# Patient Record
Sex: Male | Born: 1965 | Race: White | Hispanic: No | Marital: Married | Smoking: Former smoker
Health system: Southern US, Community
[De-identification: ages and names within clinical notes are randomized; demographics above are authoritative.]

## PROBLEM LIST (undated history)

## (undated) DIAGNOSIS — M549 Dorsalgia, unspecified: Secondary | ICD-10-CM

## (undated) DIAGNOSIS — G4733 Obstructive sleep apnea (adult) (pediatric): Secondary | ICD-10-CM

## (undated) DIAGNOSIS — F50819 Binge eating disorder, unspecified: Secondary | ICD-10-CM

## (undated) DIAGNOSIS — F419 Anxiety disorder, unspecified: Secondary | ICD-10-CM

## (undated) DIAGNOSIS — R0600 Dyspnea, unspecified: Secondary | ICD-10-CM

## (undated) DIAGNOSIS — R600 Localized edema: Secondary | ICD-10-CM

## (undated) DIAGNOSIS — F5081 Binge eating disorder: Secondary | ICD-10-CM

## (undated) DIAGNOSIS — E782 Mixed hyperlipidemia: Secondary | ICD-10-CM

## (undated) DIAGNOSIS — H409 Unspecified glaucoma: Secondary | ICD-10-CM

## (undated) DIAGNOSIS — K5901 Slow transit constipation: Secondary | ICD-10-CM

## (undated) DIAGNOSIS — I10 Essential (primary) hypertension: Secondary | ICD-10-CM

## (undated) DIAGNOSIS — R7303 Prediabetes: Secondary | ICD-10-CM

## (undated) DIAGNOSIS — E669 Obesity, unspecified: Secondary | ICD-10-CM

## (undated) DIAGNOSIS — F339 Major depressive disorder, recurrent, unspecified: Secondary | ICD-10-CM

## (undated) HISTORY — DX: Dyspnea, unspecified: R06.00

## (undated) HISTORY — DX: Unspecified glaucoma: H40.9

## (undated) HISTORY — DX: Dorsalgia, unspecified: M54.9

## (undated) HISTORY — DX: Essential (primary) hypertension: I10

## (undated) HISTORY — DX: Binge eating disorder: F50.81

## (undated) HISTORY — DX: Slow transit constipation: K59.01

## (undated) HISTORY — DX: Obesity, unspecified: E66.9

## (undated) HISTORY — PX: SPINAL FUSION: SHX223

## (undated) HISTORY — DX: Major depressive disorder, recurrent, unspecified: F33.9

## (undated) HISTORY — DX: Prediabetes: R73.03

## (undated) HISTORY — DX: Anxiety disorder, unspecified: F41.9

## (undated) HISTORY — DX: Mixed hyperlipidemia: E78.2

## (undated) HISTORY — DX: Obstructive sleep apnea (adult) (pediatric): G47.33

## (undated) HISTORY — DX: Binge eating disorder, unspecified: F50.819

## (undated) HISTORY — DX: Localized edema: R60.0

---

## 2001-07-28 ENCOUNTER — Encounter: Payer: Self-pay | Admitting: Orthopaedic Surgery

## 2001-07-28 ENCOUNTER — Ambulatory Visit (HOSPITAL_COMMUNITY): Admission: RE | Admit: 2001-07-28 | Discharge: 2001-07-28 | Payer: Self-pay | Admitting: Orthopaedic Surgery

## 2001-08-28 ENCOUNTER — Encounter: Payer: Self-pay | Admitting: Orthopaedic Surgery

## 2001-09-01 ENCOUNTER — Encounter: Payer: Self-pay | Admitting: Orthopaedic Surgery

## 2001-09-01 ENCOUNTER — Inpatient Hospital Stay (HOSPITAL_COMMUNITY): Admission: RE | Admit: 2001-09-01 | Discharge: 2001-09-03 | Payer: Self-pay | Admitting: Orthopaedic Surgery

## 2001-09-03 ENCOUNTER — Encounter: Payer: Self-pay | Admitting: Orthopaedic Surgery

## 2003-12-05 ENCOUNTER — Inpatient Hospital Stay (HOSPITAL_COMMUNITY): Admission: RE | Admit: 2003-12-05 | Discharge: 2003-12-09 | Payer: Self-pay | Admitting: Orthopaedic Surgery

## 2008-05-01 ENCOUNTER — Encounter: Admission: RE | Admit: 2008-05-01 | Discharge: 2008-06-13 | Payer: Self-pay | Admitting: Family Medicine

## 2008-12-18 ENCOUNTER — Encounter: Admission: RE | Admit: 2008-12-18 | Discharge: 2008-12-18 | Payer: Self-pay | Admitting: Family Medicine

## 2011-04-09 NOTE — Discharge Summary (Signed)
NAME:  Kenneth Green, Kenneth Green                        ACCOUNT NO.:  [id number]   MEDICAL RECORD NO.:  [id number]                   PATIENT TYPE:  INP   LOCATION:  5018                                 FACILITY:  [facility]   PHYSICIAN:  Sharolyn Douglas, M.D.                     DATE OF BIRTH:  November 29, 1965   DATE OF ADMISSION:  12/05/2003  DATE OF DISCHARGE:  12/09/2003                                 DISCHARGE SUMMARY   ADMISSION DIAGNOSES:  1. Cervical spondylotic myeloradiculopathy.  2. Anxiety and depression.  3. Hypertension.   DISCHARGE DIAGNOSES:  1. Status post C3-7 posterior small effusion, doing well.  2. Mild postoperative hemorrhagic anemia that was asymptomatic and did not     require transfusion.  3. Low-grade temperature resolved prior to discharge.  4. Anxiety and depression.  5. Hypertension.   CONSULTS:  None.   PROCEDURES:  Posterior spinal fusion of C3-7 with lateral lag screws and  pedicle screws.  This was done by Sharolyn Douglas, M.D., and assistant [facility]  Anesthesia was general.   LABORATORIES:  Preoperatively on December 02, 2003, the CBC showed the  differential within normal limits with the exception of absolute monocytes  of 0.8 and eosinophils of 7.  The comprehensive metabolic panel was normal.  The UA was negative.  H&H monitored postoperatively.  On December 06, 2003,  it reached its low at 11.6 and 34.2.  It was increasing by the following day  to 12.4 and 36.4.  He was asymptomatic.  He did not require transfusion.  The basic metabolic panel postoperatively showed a sodium slightly decreased  at 132, glucose elevated at 111, and otherwise normal.  The UA from December 05, 2003, was negative with the exception of urobilinogen of 2.0.  Culture  was negative with no growth for one day.   X-RAYS:  On December 05, 2003, C-arm intraoperatively for localization.  Cervical spine x-rays on December 09, 2003, showed posterolateral spinal  fixation at the level of  C3-7 without any complicated features.  EKG from  December 02, 2003, showed normal sinus rhythm and left anterior fascicular  block unconfirmed.   BRIEF HISTORY:  The patient is a 45 year old male who has been having  progressive difficulty with his fine motor manipulation and ambulation.  He  has not had any significant pain in his neck, however, secondary to findings  of cervical spine mild radiculopathy with his physical exam findings, as  well as MRI and x-ray findings, it was felt that the best course of  management would be decompression, as well as posterior spinal effusion.  The risks and benefits of this procedure were discussed with the patient at  length by both Dr. Noel Gerold, as well as myself.  He indicates understanding and  opted to proceed with the surgery.   HOSPITAL COURSE:  On December 05, 2003, the patient was admitted to  the  hospital for the above-listed procedure and tolerated the procedure well.  There was approximately 1000 mL blood loss with 400 mL returned via Cell  Saver.  There were no intraoperative complications.  He was transferred to  the recovery room in stable condition.  Postoperatively routine orthopedic  protocol was followed.  He did very well and did not develop any significant  postoperative complications.  He did have some difficulty overnight on  December 06, 2003, when he got extremely anxious.  He was complaining of  pain.  He was given morphine.  That did help to some degree, as well as  Percocet.  The following day, he was doing much better.  He was comfortable  utilizing a combination of PCA and pain pills.  Over the course of  postoperative days #2 and #3, he was weaned off of the PCA pump and onto  oral medication only.  He continued to do relatively well.  Physical therapy  and occupational therapy did work with the patient on a progressive  ambulation program, as well as spine precautions.  He did well with them.  By December 08, 2003, he  continued to have some pain in his neck that  required IV pain medications.  Therefore, he was kept in the hospital  overnight one more night.  By December 09, 2003, his pain had improved.  The  oral medications were doing a good job at controlling it to a tolerable  level.  He had met all orthopedic goals and was medically stable and ready  for discharge.  His vital signs were stable.  He was afebrile.   DISCHARGE PLAN:  The patient is a 45 year old male, status post posterior  spinal fusion at C3-7 and doing well.   ACTIVITY:  He should get up and ambulate every day.  He should have the  brace on at all times.  He should avoid any lifting over 5 pounds.  He  should avoid all overhead activities.   WOUND CARE:  Should have dressing change daily to the posterior aspect of  the neck.  He may shower on postoperative day #5 if there is no drainage  from the incision.  Until that time, he should keep the incision dry.   FOLLOWUP:  He is to follow up two weeks postoperatively with Dr. Noel Gerold.  He  is instructed to call for an appointment.   DISCHARGE MEDICATIONS:  1. OxyContin 20 mg b.i.d.  2. Percocet p.r.n.  3. Robaxin p.r.n.  4. Multivitamins daily.  5. Calcium daily.  6. Over the counter laxative b.i.d.  7. Resume any home medications.   DIET:  Regular diet as tolerated.   CONDITION ON DISCHARGE:  Stable and improved.   DISPOSITION:  The patient is being discharged to his home with his family's  assistance.  Home health physical therapy, occupational therapy, and home  equipment needs have been arranged.      Verlin Fester, P.A.                       Sharolyn Douglas, M.D.    CM/MEDQ  D:  01/29/2004  T:  01/30/2004  Job:  [id number]

## 2011-04-09 NOTE — Op Note (Signed)
NAME:  Kenneth Green, Kenneth Green                        ACCOUNT NO.:  [id number]   MEDICAL RECORD NO.:  [id number]                   PATIENT TYPE:  INP   LOCATION:  2899                                 FACILITY:  [facility]   PHYSICIAN:  Sharolyn Douglas, M.D.                     DATE OF BIRTH:  05/06/1966   DATE OF PROCEDURE:  12/05/2003  DATE OF DISCHARGE:                                 OPERATIVE REPORT   PREOPERATIVE DIAGNOSIS:  Cervical spondylitic myelopathy.   PROCEDURE:  1. Cervical laminectomy C3 through C7.  2. Posterior spinal arthrodesis C3 through C7.  3. Segmental spinal instrumentation C3 through C7 utilizing lateral mass     screws and C7 pedicle screw.  4. Local autogenous bone graft.  5. Neuro monitoring utilizing SSEP motor evoked potentials and EMG's.   SURGEON:  Sharolyn Douglas, M.D.   ASSISTANT:  Verlin Fester, P.A.   ANESTHESIA:  General endotracheal.   COMPLICATIONS:  None.   INDICATIONS FOR PROCEDURE:  The patient is a 45 year old male who I have  been following for several years.  His MRI scan demonstrates multilevel  cervical spinal stenosis.  He is status post ACDF at C5-6 several years ago  for disk herniation and cord compression.  His myelopathy has progressed  over the past several months and he has elected to undergo posterior  cervical laminectomy and fusion in hopes of halting the progression of his  disease and symptomatic improvement.  Of note, the patient reported before  surgery today that he had been having increasing problems with his balance  and use of the left side of his body especially his left hand.  He has been  dropping cups and jars.  Risks, benefits, and alternatives were extensively  discussed and he has elected to proceed.   DESCRIPTION OF PROCEDURE:  The patient was properly identified in the  holding area and taken to the operating room.  He underwent general  endotracheal anesthesia without difficulty.  We maintained his neck in the  neutral  position during intubation.  Neuro monitoring was then established  in the form of SSEP's, motor evoked potentials, and upper extremity EMG's.  The Mayfield tongs were then placed after sterilely prepping his scalp. He  was turned prone onto the operating room table.  I controlled his neck  during the turning.  His head was attached to the operating room table using  the Mayfield attachment arm.  Care was taken not to hyperextend the neck.  We positioned him in a neutral position.  His arms were tucked.  All bony  prominences were padded.  Face and eyes protected at all times.  Fluoroscopy  used to confirm appropriate positioning of the cervical spine.  Because of  his large girth and shoulders, it was difficult to evaluate below the C3  level.  The neck was then prepped and draped in the usual sterile fashion.  A 10 cm incision was made in the midline from C2 down to T1.  Dissection was  carried down to the deep fascia.  A very deep layer of adipose tissue was  encountered greater than 6 cm in depth. The deep fascia was then excised and  the nuchal ligament was carried down to the spinous processes.  The  paraspinal muscles were subperiosteally elevated out to the lateral border  of the lateral masses from C3 down to C7. Care was taken to protect the C2-3  facet joint capsule as well as not to remove the muscular attachment to the  C2 spinous process.  We placed deep retractors.  The facet joints from C3  down to C7 were debrided of all soft tissue using a curet.  High speed bur  was utilized to decorticate the facet joints.  We then drilled lateral mass  screw holes at C3, C4, C5, and C6 bilaterally.  Each screw hole was drilled,  measured, and then tapped.  We placed 3.5 x 12 mm screws bilaterally at C3,  14 x 3.5 mm screws at C4, 3.5 x 14 mm screw on the left at C5 and a 10 mm  screw on the right at C5.  12 mm screws were placed bilaterally at C6.  We  then turned our attention to  performing a laminectomy C3 through C7.  High  speed bur used to create two troughs at the lamina facet junction.  We then  completed the laminectomy with a 1 mm Kerrison through each trough. We then  elevated the ligamentum flavum and lifted off the lamina en block.  Bleeding  was controlled with bipolar electrocautery and Gelfoam.  Great care was  taken to protect the underlying spinal cord at all times.  We completed  foraminotomies bilaterally at each level using a 2 mm Kerrison.  We then  turned our attention to placing a pedicle screw at C7 bilaterally.  The  pedicle could be palpated from within the spinal canal.  Drilled the pedicle  and then placed 16 x 4.0 mm screws bilaterally.  We had excellent screw  purchase.  There were no deleterious changes in the EMG's, SSEP's, or motor  evoked potentials.  We then morselized the lamina and packed the facet  joints with the bone graft material completing the fusion.  We placed  Titanium rods into the polyaxial screw head with the appropriate caps.  Each  cap was then tightened using the final tightener.  Hemostasis was achieved  with bipolar electrocautery and Gelfoam. The wound was irrigated.  Deep  Hemovac drain placed.  The deep fascia closed with interrupted #1 Vicryl  sutures.  Subcutaneous layer closed with 0 Vicryl followed by 2-0 Vicryl  sutures.  The skin edges were approximated with a running 3-0 nylon suture.  A sterile dressing applied.  The patient was turned supine.  The Mayfield  tongs were removed.  The pin sites were intact.  An Aspen cervical collar  was fitted.  The patient was extubated without difficulty and transferred to  the recovery room in stable condition able to move his upper and lower  extremities.                                               Sharolyn Douglas, M.D.    MC/MEDQ  D:  12/05/2003  T:  12/06/2003  Job:  [id number]

## 2011-04-09 NOTE — H&P (Signed)
NAME:  Kenneth Green, Kenneth Green                        ACCOUNT NO.:  [id number]   MEDICAL RECORD NO.:  [id number]                   PATIENT TYPE:  INP   LOCATION:  2899                                 FACILITY:  [facility]   PHYSICIAN:  Sharolyn Douglas, M.D.                     DATE OF BIRTH:  1966/03/05   DATE OF ADMISSION:  12/05/2003  DATE OF DISCHARGE:                                HISTORY & PHYSICAL   CHIEF COMPLAINT:  Decrease in function in his upper extremities, as well as  progressively worsening paresthesias and numbness primarily in his left  upper extremity, as well as the left lower extremity, and increasing  difficulty with balance.   HISTORY OF PRESENT ILLNESS:  The patient is a 45 year old male who was  diagnosed with cervical spondylosis and cervical stenosis.  He has had  progressively worsening paresthesias in his upper extremities, primarily in  the left, as well as beginning to have paresthesias in his lower  extremities.  He is having increasing difficulty with coordination and  function with fine motor movements in his upper extremities.  He also has  increasing difficulty with balance and gait.  He was found to have cervical  spinal stenosis.  Secondary to these findings and deteriorating function, it  was felt that his best course of management would be a posterior cervical  laminectomy, as well as fusion from C3 to C7.  The risks and benefits of  this procedure were discussed with the patient by Dr. Noel Gerold, as well as  myself.  He indicated understanding and opted to proceed.   ALLERGIES:  No known drug allergies.   MEDICATIONS:  1. Benazepril HCTZ 15 mg p.o. daily.  2. Paxil HCl 20 mg p.o. daily.   PAST MEDICAL HISTORY:  Significant for:  1. Depression and anxiety.  2. Hypertension.   PAST SURGICAL HISTORY:  ACDF two years ago.   SOCIAL HISTORY:  The patient denies tobacco use.  Denies alcohol use.  He is  married.  His wife will be available to help through his  postoperative  course.   FAMILY MEDICAL HISTORY:  Noncontributory.   REVIEW OF SYSTEMS:  The patient denies fever, chills, sweats, or bleeding  tendencies.  CNS:  Denies blurred vision, double vision, seizures,  headaches, or paralysis.  CARDIOVASCULAR:  Denies chest pain, angina,  orthopnea, or palpitations.  PULMONARY:  Denies shortness of breath,  productive cough, or hemoptysis.  GASTROINTESTINAL:  Denies nausea,  vomiting, constipation, diarrhea, melena, or bloody stools.  GENITOURINARY:  Denies hematuria, dysuria, or discharge.  MUSCULOSKELETAL:  As per HPI.   PHYSICAL EXAMINATION:  GENERAL APPEARANCE:  The patient is a 45 year old  white male who is alert and oriented and in no acute distress.  He is well  nourished, appears his stated age, and is pleasant and cooperative to exam.  HEENT:  The head is normocephalic and atraumatic.  The  pupils are equal,  round, and reactive.  Extraocular movements are intact.  Nares patent.  The  pharynx is clear.  NECK:  Soft to palpation.  No lymphadenopathy, thyromegaly, or bruits  appreciated.  CHEST:  Clear to auscultation bilaterally.  No rales, rhonchi, stridor,  wheezes, or friction rubs.  BREASTS:  No pertinent and not performed.  HEART:  S1 and S2.  Regular rate and rhythm.  No murmurs, rubs, or gallops  noted.  ABDOMEN:  Soft to palpation.  Nontender and nondistended.  No organomegaly  noted.  Positive bowel sounds throughout.  GENITOURINARY:  Not pertinent and not performed.  EXTREMITIES:  The patient's motor function and sensation are grossly intact.  See the operative notes for details of the exam.  SKIN:  Intact without any lesions or rashes.   LABORATORY DATA:  MRI shows cervical stenosis and spondylosis.   IMPRESSION:  1. Cervical spondylitic myeloradiculopathy.  2. Anxiety and depression.  3. Hypertension.   PLAN:  Admit to [facility] on November 25, 2003, for a posterior  cervical fusion and laminectomy from  C3 to C7.  This will be done by Sharolyn Douglas, M.D.      Verlin Fester, P.A.                       Sharolyn Douglas, M.D.    CM/MEDQ  D:  12/05/2003  T:  12/05/2003  Job:  [id number]

## 2013-04-02 ENCOUNTER — Ambulatory Visit (INDEPENDENT_AMBULATORY_CARE_PROVIDER_SITE_OTHER): Payer: BC Managed Care – PPO | Admitting: Surgery

## 2013-04-02 ENCOUNTER — Encounter (INDEPENDENT_AMBULATORY_CARE_PROVIDER_SITE_OTHER): Payer: Self-pay | Admitting: Surgery

## 2013-04-02 VITALS — BP 152/86 | HR 64 | Temp 97.0°F | Resp 16 | Ht 69.0 in | Wt 285.6 lb

## 2013-04-02 DIAGNOSIS — L723 Sebaceous cyst: Secondary | ICD-10-CM

## 2013-04-02 NOTE — Patient Instructions (Signed)

## 2013-04-02 NOTE — Progress Notes (Signed)
[name], male   DOB: 29-Sep-1966, 47 y.o.   MRN: [id number]  Chief Complaint  Patient presents with  . New Evaluation    eval seb cyst on scalp    HPI Kenneth Green is a 47 y.o. male.  Patient sent at the request of Dr. Uvaldo Rising for cyst on right scalp. This has been present for a number of months. It swells and drains recurs. It becomes tender. HPI  Past Medical History  Diagnosis Date  . Hypertension   . Glaucoma     Past Surgical History  Procedure Laterality Date  . Spinal fusion      Family History  Problem Relation Age of Onset  . Heart disease Mother   . Stroke Father   . Heart disease Sister   . Diabetes Sister     Social History History  Substance Use Topics  . Smoking status: Former Games developer  . Smokeless tobacco: Never Used  . Alcohol Use: Yes    No Known Allergies  Current Outpatient Prescriptions  Medication Sig Dispense Refill  . Cholecalciferol (VITAMIN D PO) Take by mouth.      . fish oil-omega-3 fatty acids 1000 MG capsule Take 2 g by mouth daily.      . Multiple Vitamin (MULTIVITAMIN) tablet Take 1 tablet by mouth daily.      . travoprost, benzalkonium, (TRAVATAN) 0.004 % ophthalmic solution 1 drop at bedtime.      . benazepril-hydrochlorthiazide (LOTENSIN HCT) 10-12.5 MG per tablet       . buPROPion (WELLBUTRIN XL) 150 MG 24 hr tablet       . imiquimod (ALDARA) 5 % cream        No current facility-administered medications for this visit.    Review of Systems Review of Systems  Constitutional: Negative.   Cardiovascular: Negative.   Endocrine: Negative.   Musculoskeletal: Negative.   Skin: Negative.   Neurological: Negative.   Hematological: Negative.     Blood pressure 152/86, pulse 64, temperature 97 F (36.1 C), temperature source Temporal, resp. rate 16, height 5\' 9"  (1.753 m), weight 285 lb 9.6 oz (129.547 kg).  Physical Exam Physical Exam  Constitutional: He is oriented to person, place, and time. He appears  well-developed and well-nourished.  HENT:  Head:    Pulmonary/Chest: Effort normal.  Musculoskeletal: Normal range of motion.  Neurological: He is alert and oriented to person, place, and time.  Skin: Skin is warm and dry.  Psychiatric: He has a normal mood and affect. His behavior is normal. Thought content normal.      Assessment    Epidermal inclusion cyst symptomatic    Plan    Return to clinic for excision the near future.The procedure has been discussed with the patient.  Alternative therapies have been discussed with the patient.  Operative risks include bleeding,  Infection,  Organ injury,  Nerve injury,  Blood vessel injury,  DVT,  Pulmonary embolism,  Death,  And possible reoperation.  Medical management risks include worsening of present situation.  The success of the procedure is 50 -90 % at treating patients symptoms.  The patient understands and agrees to proceed.       Jorie Zee A. 04/02/2013, 11:30 AM

## 2013-04-20 ENCOUNTER — Ambulatory Visit (INDEPENDENT_AMBULATORY_CARE_PROVIDER_SITE_OTHER): Payer: BC Managed Care – PPO | Admitting: Surgery

## 2013-04-20 ENCOUNTER — Encounter (INDEPENDENT_AMBULATORY_CARE_PROVIDER_SITE_OTHER): Payer: Self-pay | Admitting: Surgery

## 2013-04-20 VITALS — BP 150/96 | HR 76 | Temp 97.1°F | Resp 16 | Ht 69.0 in | Wt 289.6 lb

## 2013-04-20 DIAGNOSIS — L723 Sebaceous cyst: Secondary | ICD-10-CM

## 2013-04-20 DIAGNOSIS — L720 Epidermal cyst: Secondary | ICD-10-CM

## 2013-04-20 NOTE — Progress Notes (Signed)
Subjective:     [name], male   DOB: Mar 24, 1966, 47 y.o.   MRN: [id number]  HPI Patient will return for excision of ruptured epidermal inclusion cyst right scalp. No complaints.  Review of Systems  Constitutional: Negative.        Objective:   Physical Exam  HENT:  Head:         Assessment:     Ruptured 1 cm epidermal inclusion cyst right scalp    Plan:     Excision today in office. She did discuss the patient. Informed consent obtained. The right scalp was prepped with Betadine 1% lidocaine was used for anesthesia. I marsupialized the cyst since it or it ruptured and the lining was disrupted. Silver nitrate applied. Dry dressing applied. Total size 1 cm. Patient tolerated well. Care instructions given. Followup as needed .

## 2013-04-20 NOTE — Patient Instructions (Signed)
Apply neosporin daily  Cover with band aid.  Return as needed.

## 2015-04-23 ENCOUNTER — Other Ambulatory Visit: Payer: Self-pay | Admitting: Family Medicine

## 2015-04-23 DIAGNOSIS — R109 Unspecified abdominal pain: Secondary | ICD-10-CM

## 2015-04-28 ENCOUNTER — Ambulatory Visit
Admission: RE | Admit: 2015-04-28 | Discharge: 2015-04-28 | Disposition: A | Discharge status: Home / Self Care | Payer: Managed Care, Other (non HMO) | Source: Ambulatory Visit | Attending: Family Medicine | Admitting: Family Medicine

## 2015-04-28 DIAGNOSIS — R109 Unspecified abdominal pain: Secondary | ICD-10-CM

## 2015-04-28 MED ORDER — IOPAMIDOL (ISOVUE-300) INJECTION 61%
125.0000 mL | Freq: Once | INTRAVENOUS | Status: AC | PRN
Start: 2015-04-28 — End: 2015-04-28
  Administered 2015-04-28: 125 mL via INTRAVENOUS

## 2015-04-29 ENCOUNTER — Other Ambulatory Visit: Payer: Self-pay

## 2016-06-15 ENCOUNTER — Emergency Department (HOSPITAL_BASED_OUTPATIENT_CLINIC_OR_DEPARTMENT_OTHER): Payer: Managed Care, Other (non HMO)

## 2016-06-15 ENCOUNTER — Emergency Department (HOSPITAL_BASED_OUTPATIENT_CLINIC_OR_DEPARTMENT_OTHER)
Admission: EM | Admit: 2016-06-15 | Discharge: 2016-06-15 | Disposition: A | Discharge status: Home / Self Care | Payer: Managed Care, Other (non HMO) | Attending: Emergency Medicine | Admitting: Emergency Medicine

## 2016-06-15 ENCOUNTER — Encounter (HOSPITAL_BASED_OUTPATIENT_CLINIC_OR_DEPARTMENT_OTHER): Payer: Self-pay | Admitting: *Deleted

## 2016-06-15 DIAGNOSIS — I10 Essential (primary) hypertension: Secondary | ICD-10-CM | POA: Diagnosis not present

## 2016-06-15 DIAGNOSIS — Z87891 Personal history of nicotine dependence: Secondary | ICD-10-CM | POA: Insufficient documentation

## 2016-06-15 DIAGNOSIS — Z79899 Other long term (current) drug therapy: Secondary | ICD-10-CM | POA: Insufficient documentation

## 2016-06-15 DIAGNOSIS — R109 Unspecified abdominal pain: Secondary | ICD-10-CM

## 2016-06-15 DIAGNOSIS — K529 Noninfective gastroenteritis and colitis, unspecified: Secondary | ICD-10-CM | POA: Diagnosis not present

## 2016-06-15 DIAGNOSIS — R1084 Generalized abdominal pain: Secondary | ICD-10-CM

## 2016-06-15 LAB — CBC WITH DIFFERENTIAL/PLATELET
Basophils Absolute: 0 10*3/uL (ref 0.0–0.1)
Basophils Relative: 0 %
Eosinophils Absolute: 0.3 10*3/uL (ref 0.0–0.7)
Eosinophils Relative: 4 %
HCT: 40.8 % (ref 39.0–52.0)
Hemoglobin: 14.1 g/dL (ref 13.0–17.0)
Lymphocytes Relative: 20 %
Lymphs Abs: 1.5 10*3/uL (ref 0.7–4.0)
MCH: 29 pg (ref 26.0–34.0)
MCHC: 34.6 g/dL (ref 30.0–36.0)
MCV: 83.8 fL (ref 78.0–100.0)
Monocytes Absolute: 0.7 10*3/uL (ref 0.1–1.0)
Monocytes Relative: 8 %
Neutro Abs: 5.2 10*3/uL (ref 1.7–7.7)
Neutrophils Relative %: 68 %
Platelets: 235 10*3/uL (ref 150–400)
RBC: 4.87 MIL/uL (ref 4.22–5.81)
RDW: 13 % (ref 11.5–15.5)
WBC: 7.7 10*3/uL (ref 4.0–10.5)

## 2016-06-15 LAB — URINALYSIS, ROUTINE W REFLEX MICROSCOPIC
Bilirubin Urine: NEGATIVE
Glucose, UA: NEGATIVE mg/dL
Hgb urine dipstick: NEGATIVE
Ketones, ur: NEGATIVE mg/dL
Leukocytes, UA: NEGATIVE
Nitrite: NEGATIVE
Protein, ur: NEGATIVE mg/dL
Specific Gravity, Urine: 1.005 (ref 1.005–1.030)
pH: 5.5 (ref 5.0–8.0)

## 2016-06-15 LAB — BASIC METABOLIC PANEL
Anion gap: 8 (ref 5–15)
BUN: 14 mg/dL (ref 6–20)
CO2: 22 mmol/L (ref 22–32)
Calcium: 9.1 mg/dL (ref 8.9–10.3)
Chloride: 105 mmol/L (ref 101–111)
Creatinine, Ser: 1.19 mg/dL (ref 0.61–1.24)
GFR calc Af Amer: >60 mL/min (ref >60)
GFR calc non Af Amer: >60 mL/min (ref >60)
Glucose, Bld: 106 mg/dL — ABNORMAL HIGH (ref 65–99)
Potassium: 3.8 mmol/L (ref 3.5–5.1)
Sodium: 135 mmol/L (ref 135–145)

## 2016-06-15 LAB — LIPASE, BLOOD: Lipase: 28 U/L (ref 11–51)

## 2016-06-15 LAB — SEDIMENTATION RATE: Sed Rate: 5 mm/hr (ref 0–16)

## 2016-06-15 MED ORDER — METRONIDAZOLE 500 MG PO TABS
500.0000 mg | ORAL_TABLET | Freq: Once | ORAL | Status: AC
  Administered 2016-06-15: 500 mg via ORAL
  Filled 2016-06-15: qty 1

## 2016-06-15 MED ORDER — SODIUM CHLORIDE 0.9 % IV SOLN
Freq: Once | INTRAVENOUS | Status: AC
  Administered 2016-06-15: 17:00:00 via INTRAVENOUS

## 2016-06-15 MED ORDER — CIPROFLOXACIN HCL 500 MG PO TABS: 500.0000 mg | ORAL_TABLET | Freq: Two times a day (BID) | ORAL | 0 refills | Status: DC

## 2016-06-15 MED ORDER — ONDANSETRON HCL 4 MG/2ML IJ SOLN: 4.0000 mg | Freq: Once | INTRAMUSCULAR | Status: DC

## 2016-06-15 MED ORDER — MORPHINE SULFATE (PF) 4 MG/ML IV SOLN
4.0000 mg | INTRAVENOUS | Status: DC | PRN
Start: 2016-06-15 — End: 2016-06-16

## 2016-06-15 MED ORDER — HYDROCODONE-ACETAMINOPHEN 5-325 MG PO TABS: 2.0000 | ORAL_TABLET | ORAL | 0 refills | Status: DC | PRN

## 2016-06-15 MED ORDER — METRONIDAZOLE 500 MG PO TABS: 500.0000 mg | ORAL_TABLET | Freq: Two times a day (BID) | ORAL | 0 refills | Status: DC

## 2016-06-15 MED ORDER — CIPROFLOXACIN HCL 500 MG PO TABS
500.0000 mg | ORAL_TABLET | Freq: Once | ORAL | Status: AC
  Administered 2016-06-15: 500 mg via ORAL
  Filled 2016-06-15: qty 1

## 2016-06-15 NOTE — ED Notes (Signed)
Pt declined meds for pain and nausea at this time. States he will notify RN if he needs them

## 2016-06-15 NOTE — ED Provider Notes (Signed)
[name] Provider Note   CSN: [id number] Arrival date & time: 06/15/16  1527  First Provider Contact:  4:01 PM       History   Chief Complaint Chief Complaint  Patient presents with  . Abdominal Pain    HPI Kenneth Green is a 50 y.o. male.  He reports abdominal pain for the last 3 days. Seen by primary care physician yesterday and had labs obtained. This was called today because his lipase was high referred to the emergency room as he was still continuing to have pain. Does not use excessive alcohol. His never had pancreatitis. He describes this as a diffuse pain in his upper abdomen. He feels full most of the time. He does not have pain with by mouth intake. He does not have any particular food intolerances. Normal bowel movement yesterday and today. Yesterday felt a little better after bowel movement. Today was somewhat softer bowel movement that afforded him no relief of his symptoms. He's had no fever. He said no urinary symptoms.  HPI  Past Medical History:  Diagnosis Date  . Glaucoma   . Hypertension     Patient Active Problem List   Diagnosis Date Noted  . Ruptured sebaceous cyst 04/20/2013    Past Surgical History:  Procedure Laterality Date  . SPINAL FUSION         Home Medications    Prior to Admission medications   Medication Sig Start Date End Date Taking? Authorizing Provider  methocarbamol (ROBAXIN) 500 MG tablet Take 500 mg by mouth 4 (four) times daily.   Yes Historical Provider, MD  omeprazole (PRILOSEC) 40 MG capsule Take 40 mg by mouth daily.   Yes Historical Provider, MD  benazepril-hydrochlorthiazide (LOTENSIN HCT) 10-12.5 MG per tablet  03/20/13   Historical Provider, MD  buPROPion (WELLBUTRIN XL) 150 MG 24 hr tablet  03/25/13   Historical Provider, MD  Cholecalciferol (VITAMIN D PO) Take by mouth.    Historical Provider, MD  ciprofloxacin (CIPRO) 500 MG tablet Take 1 tablet (500 mg total) by mouth 2 (two) times daily. 06/15/16    Tanna Furry, MD  HYDROcodone-acetaminophen (NORCO/VICODIN) 5-325 MG tablet Take 2 tablets by mouth every 4 (four) hours as needed. 06/15/16   Tanna Furry, MD  imiquimod Ler[place]) 5 % cream  03/25/13   Historical Provider, MD  metroNIDAZOLE (FLAGYL) 500 MG tablet Take 1 tablet (500 mg total) by mouth 2 (two) times daily. 06/15/16   Tanna Furry, MD    Family History Family History  Problem Relation Age of Onset  . Heart disease Mother   . Stroke Father   . Heart disease Sister   . Diabetes Sister     Social History Social History  Substance Use Topics  . Smoking status: Former Research scientist (life sciences)  . Smokeless tobacco: Never Used  . Alcohol use Yes     Comment: 2-3 beers weekly     Allergies   Iohexol   Review of Systems Review of Systems  Constitutional: Negative for appetite change, chills, diaphoresis, fatigue and fever.  HENT: Negative for mouth sores, sore throat and trouble swallowing.   Eyes: Negative for visual disturbance.  Respiratory: Negative for cough, chest tightness, shortness of breath and wheezing.   Cardiovascular: Negative for chest pain.  Gastrointestinal: Positive for abdominal distention and abdominal pain. Negative for diarrhea, nausea and vomiting.       Early satiety  Endocrine: Negative for polydipsia, polyphagia and polyuria.  Genitourinary: Negative for dysuria, frequency and hematuria.  Musculoskeletal: Negative for gait problem.  Skin: Negative for color change, pallor and rash.  Neurological: Negative for dizziness, syncope, light-headedness and headaches.  Hematological: Does not bruise/bleed easily.  Psychiatric/Behavioral: Negative for behavioral problems and confusion.     Physical Exam Updated Vital Signs BP 123/78 (BP Location: Left Arm)   Pulse 75   Temp 97.9 F (36.6 C) (Oral)   Resp 18   Ht 5\' 9"  (1.753 m)   Wt (!) 303 lb (137.4 kg)   SpO2 98%   BMI 44.75 kg/m   Physical Exam  Constitutional: He is oriented to person, place, and time. He  appears well-developed and well-nourished. No distress.  HENT:  Head: Normocephalic.  Eyes: Conjunctivae are normal. Pupils are equal, round, and reactive to light. No scleral icterus.  Neck: Normal range of motion. Neck supple. No thyromegaly present.  Cardiovascular: Normal rate and regular rhythm.  Exam reveals no gallop and no friction rub.   No murmur heard. Pulmonary/Chest: Effort normal and breath sounds normal. No respiratory distress. He has no wheezes. He has no rales.  Abdominal: Soft. Bowel sounds are normal. He exhibits no distension. There is no tenderness. There is no rebound.  Protuberant abdomen. Not firm. No rigidity. No peritoneal irritation. Tender primarily to the upper abdomen. Normal active bowel sounds.  Musculoskeletal: Normal range of motion.  Neurological: He is alert and oriented to person, place, and time.  Skin: Skin is warm and dry. No rash noted.  Psychiatric: He has a normal mood and affect. His behavior is normal.     ED Treatments / Results  Labs (all labs ordered are listed, but only abnormal results are displayed) Labs Reviewed  BASIC METABOLIC PANEL - Abnormal; Notable for the following:       Result Value   Glucose, Bld 106 (*)    All other components within normal limits  URINALYSIS, ROUTINE W REFLEX MICROSCOPIC (NOT AT [facility])  CBC WITH DIFFERENTIAL/PLATELET  LIPASE, BLOOD  SEDIMENTATION RATE    EKG  EKG Interpretation None       Radiology Ct Abdomen Pelvis Wo Contrast  Result Date: 06/15/2016 CLINICAL DATA:  Mid abdominal pain radiating to the left flank. EXAM: CT ABDOMEN AND PELVIS WITHOUT CONTRAST TECHNIQUE: Multidetector CT imaging of the abdomen and pelvis was performed following the standard protocol without IV contrast. COMPARISON:  CT abdomen pelvis - 04/28/2015; 06/28/2008 FINDINGS: The lack of intravenous contrast limits the ability to evaluate solid abdominal organs. Lower chest: Limited visualization of lower thorax  demonstrates minimal dependent subpleural ground-glass atelectasis. No discrete focal airspace opacities. No pleural effusion. Normal heart size. Coronary artery calcifications. No pericardial effusion. Hepatobiliary: Normal hepatic contour. Normal appearance of the gallbladder given underdistention. No radiopaque gallstones. No ascites. Pancreas: Normal noncontrast appearance of the pancreas Spleen: Normal noncontrast appearance of the spleen. Incidental note is made of a tiny splenule. Adrenals/Urinary Tract: Normal noncontrast appearance of the bilateral kidneys. No renal stones. No renal stones are seen along the expected course of either ureter or the urinary bladder. Normal noncontrast appearance of the urinary bladder given degree distention. No urinary obstruction or perinephric stranding. Normal noncontrast appearance of the bilateral adrenal glands. Stomach/Bowel: Small hiatal hernia. Moderate colonic stool burden without evidence of enteric obstruction. There is suspected mild diffuse bowel wall thickening involving several loops of jejunum within the midline of the mid/lower abdomen (representative images 45, 60 and 67, series 2), not resulting in enteric obstruction. Normal noncontrast appearance of the terminal ileum and retrocecal appendix. No  pneumoperitoneum, pneumatosis or portal venous gas. Vascular/Lymphatic: Minimal amount of atherosclerotic plaque within a normal caliber abdominal aorta. Prominent porta hepatis lymph node measures approximately 1.4 cm in greatest short axis diameter (image 33, series 2), unchanged since the 2009 examination and thus of benign etiology. Interval development of ill-defined misty appearance of the abdominal mesentery(representative image 46, series 2). Associated scattered mesenteric lymph nodes are numerous though individually not enlarged by size criteria with index mesenteric node measuring 0.7 cm in greatest short axis diameter. No bulky retroperitoneal,  mesenteric, pelvic or inguinal lymphadenopathy. Reproductive: Dystrophic calcifications within a normal sized prostate gland. No free fluid within the pelvic cul-de-sac. Other: Small bilateral indirect mesenteric fat containing inguinal hernias unchanged. Musculoskeletal: No acute or aggressive osseous abnormalities. Bilateral facet degenerative change of the lower lumbar spine. Note is made of bilateral os acetabuli. IMPRESSION: 1. Suspected mild diffuse bowel wall thickening involving several loops of jejunum the midline of the mid/lower abdomen, not resulting in enteric obstruction. Differential considerations are broad though include both infectious and inflammatory etiologies. Clinical correlation is advised. 2. Otherwise, no explanation for patient's radiating abdominal and flank pain. Specifically, no evidence of nephrolithiasis, urinary or enteric obstruction. 3. Mild misty appearance of the abdominal mesentery without associated adenopathy favored to be reactive in etiology. 4. Small hiatal hernia. 5. Coronary artery calcifications. Aortic Atherosclerosis (ICD10-170.0) Electronically Signed   By: Sandi Mariscal M.D.   On: 06/15/2016 19:03   Procedures Procedures (including critical care time)  Medications Ordered in ED Medications  ondansetron (ZOFRAN) injection 4 mg (4 mg Intravenous Not Given 06/15/16 1836)  morphine 4 MG/ML injection 4 mg (not administered)  0.9 %  sodium chloride infusion ( Intravenous New Bag/Given 06/15/16 1658)     Initial Impression / Assessment and Plan / ED Course  I have reviewed the triage vital signs and the nursing notes.  Pertinent labs & imaging results that were available during my care of the patient were reviewed by me and considered in my medical decision making (see chart for details).  Clinical Course  Value Comment By Time  CT ABDOMEN PELVIS WO CONTRAST (Reviewed) Tanna Furry, MD 07/25 1937  CT ABDOMEN PELVIS WO CONTRAST CT result reviewed.  No WBC  elevation or fever.  Pt with normal colonoscopy 1 year ago, doubt Crohns. Await sed rate. Tanna Furry, MD 07/25 1938    CT findings reviewed. Sedimentation rate is not elevated. Likely infectious enteritis. The viral. I discussed with him the possibility of expectant management versus antibiotic treatment. He preferred treatment with antibiotics stating that this was unlike anything he has experienced in the past. He does not have nausea or diarrhea. Ask them to follow up with his primary care physician in the next 2-3 days if not seeing marked improvement. ER with acute changes or worsening.  Final Clinical Impressions(s) / ED Diagnoses   Final diagnoses:  Generalized abdominal pain  Enteritis    New Prescriptions New Prescriptions   CIPROFLOXACIN (CIPRO) 500 MG TABLET    Take 1 tablet (500 mg total) by mouth 2 (two) times daily.   HYDROCODONE-ACETAMINOPHEN (NORCO/VICODIN) 5-325 MG TABLET    Take 2 tablets by mouth every 4 (four) hours as needed.   METRONIDAZOLE (FLAGYL) 500 MG TABLET    Take 1 tablet (500 mg total) by mouth 2 (two) times daily.     Tanna Furry, MD 06/15/16 [phone]

## 2016-06-15 NOTE — ED Notes (Signed)
Pt alert, NAD, calm, interactive, resting comfortably, wife at [facility], denies questions or needs, pending final lab result. VSS. No dyspnea, denies need for pain or nausea meds.

## 2016-06-15 NOTE — ED Triage Notes (Signed)
Sent here from [place] office Pt c/o diffuse abd pain x 2 days , Lipase 192

## 2016-06-15 NOTE — Discharge Instructions (Signed)
Your Ct scan shows localized infection of your small intestine.  Prescriptions for Cipro and Flagyl, both antibiotics. Take a daily probiotic while taking the Cipro. Stop Cipro with excessive diarrhea or any joint pain.  Follow-up with her primary care physician within the next 3 days if not improving.

## 2016-06-15 NOTE — ED Notes (Signed)
Patient transported to CT 

## 2017-01-19 DIAGNOSIS — H401131 Primary open-angle glaucoma, bilateral, mild stage: Secondary | ICD-10-CM | POA: Diagnosis not present

## 2017-04-13 DIAGNOSIS — M5416 Radiculopathy, lumbar region: Secondary | ICD-10-CM | POA: Diagnosis not present

## 2017-04-13 DIAGNOSIS — M48062 Spinal stenosis, lumbar region with neurogenic claudication: Secondary | ICD-10-CM | POA: Diagnosis not present

## 2017-04-13 DIAGNOSIS — M5136 Other intervertebral disc degeneration, lumbar region: Secondary | ICD-10-CM | POA: Diagnosis not present

## 2017-04-25 DIAGNOSIS — E782 Mixed hyperlipidemia: Secondary | ICD-10-CM | POA: Diagnosis not present

## 2017-04-25 DIAGNOSIS — G4733 Obstructive sleep apnea (adult) (pediatric): Secondary | ICD-10-CM | POA: Diagnosis not present

## 2017-04-25 DIAGNOSIS — I10 Essential (primary) hypertension: Secondary | ICD-10-CM | POA: Diagnosis not present

## 2017-04-25 DIAGNOSIS — Z0000 Encounter for general adult medical examination without abnormal findings: Secondary | ICD-10-CM | POA: Diagnosis not present

## 2017-05-02 DIAGNOSIS — H401131 Primary open-angle glaucoma, bilateral, mild stage: Secondary | ICD-10-CM | POA: Diagnosis not present

## 2017-05-04 DIAGNOSIS — L089 Local infection of the skin and subcutaneous tissue, unspecified: Secondary | ICD-10-CM | POA: Diagnosis not present

## 2017-05-04 DIAGNOSIS — D485 Neoplasm of uncertain behavior of skin: Secondary | ICD-10-CM | POA: Diagnosis not present

## 2017-07-05 DIAGNOSIS — G4733 Obstructive sleep apnea (adult) (pediatric): Secondary | ICD-10-CM | POA: Diagnosis not present

## 2017-07-21 DIAGNOSIS — I10 Essential (primary) hypertension: Secondary | ICD-10-CM | POA: Diagnosis not present

## 2017-07-21 DIAGNOSIS — G4733 Obstructive sleep apnea (adult) (pediatric): Secondary | ICD-10-CM | POA: Diagnosis not present

## 2017-07-25 DIAGNOSIS — G4733 Obstructive sleep apnea (adult) (pediatric): Secondary | ICD-10-CM | POA: Diagnosis not present

## 2017-08-01 DIAGNOSIS — G4733 Obstructive sleep apnea (adult) (pediatric): Secondary | ICD-10-CM | POA: Diagnosis not present

## 2017-08-03 IMAGING — CT CT ABD-PELV W/O CM
2 of 4 series · 14 of 46 positions shown, 16 images · non-contrast
Comparison: CT abdomen pelvis - 04/28/2015; 06/28/2008

CLINICAL DATA: Mid abdominal pain radiating to the left flank.

EXAM:
CT ABDOMEN AND PELVIS WITHOUT CONTRAST
TECHNIQUE: Multidetector CT imaging of the abdomen and pelvis was performed
following the standard protocol without IV contrast.

[Series 2: axial st · axial · 0.98mm/px · z∈[-545,-85]mm · 11 of 107 slices shown, 13 images]
[im 10/107  soft-tissue]
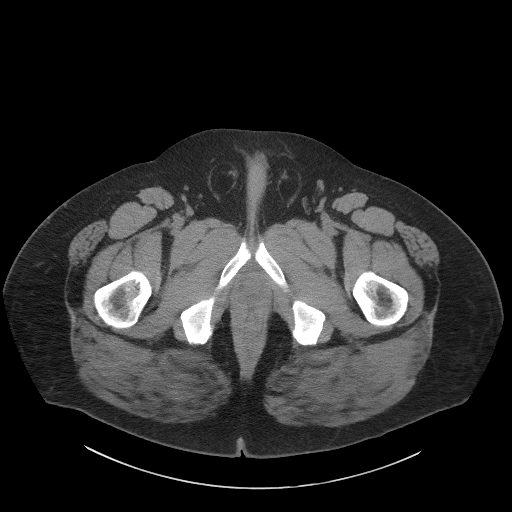
[im 10/107  bone]
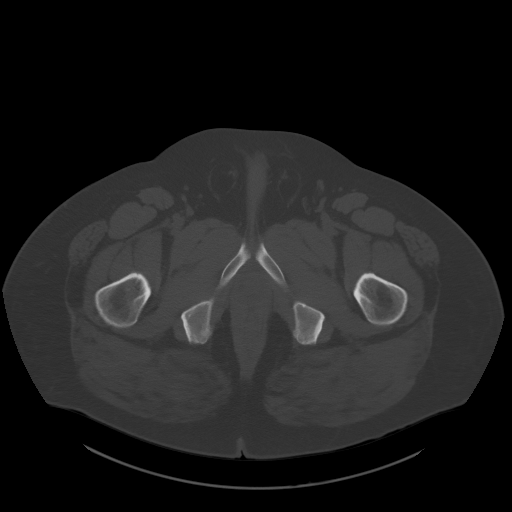
[im 19/107  soft-tissue]
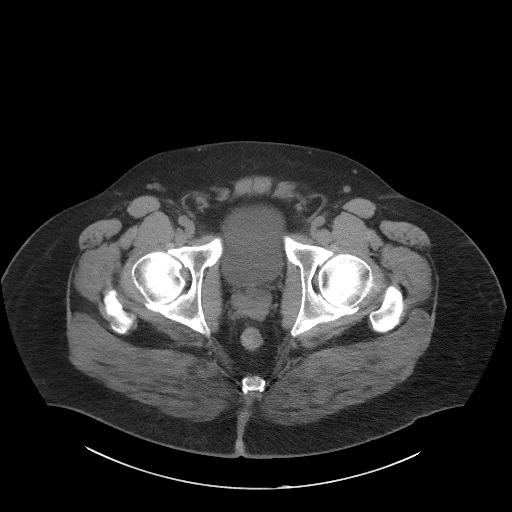
[im 28/107  soft-tissue]
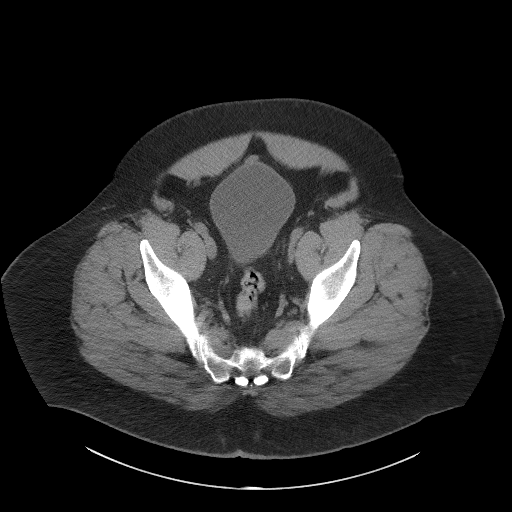
[im 37/107  soft-tissue]
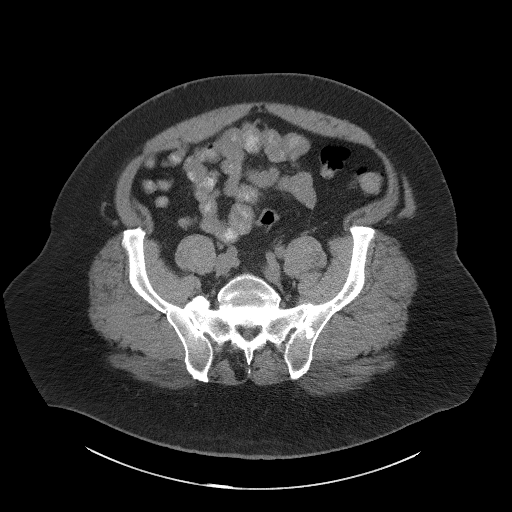
[im 47/107  soft-tissue]
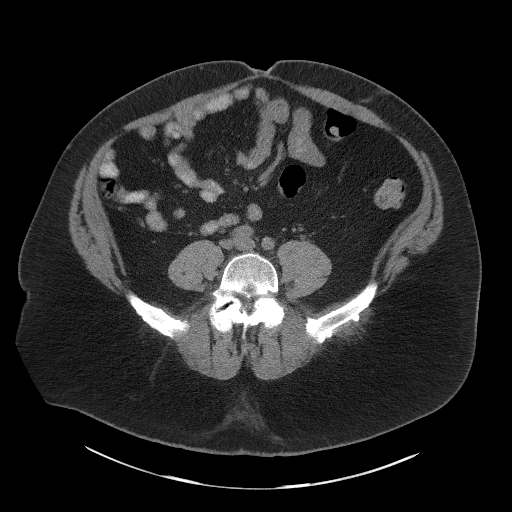
[im 56/107  soft-tissue]
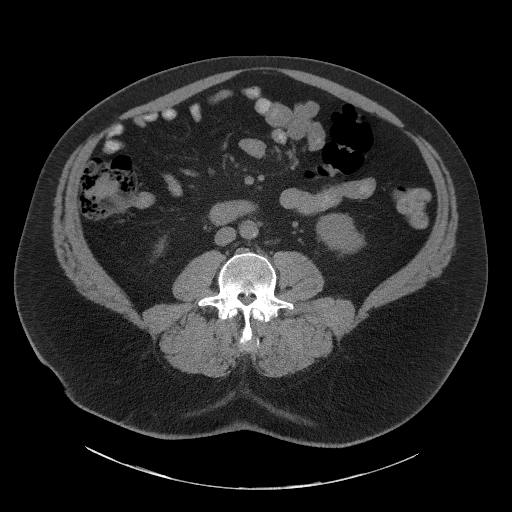
[im 65/107  soft-tissue]
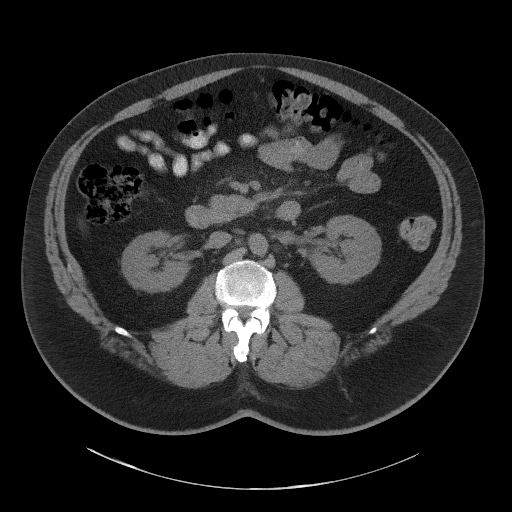
[im 74/107  soft-tissue]
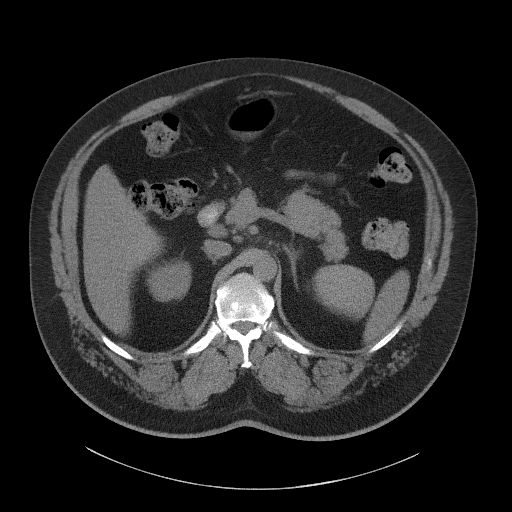
[im 83/107  soft-tissue]
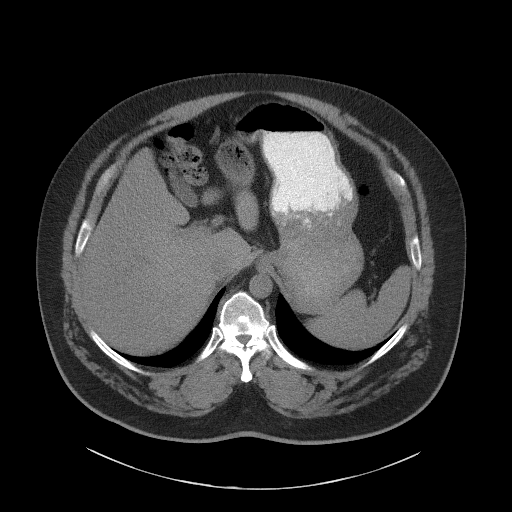
[im 83/107  bone]
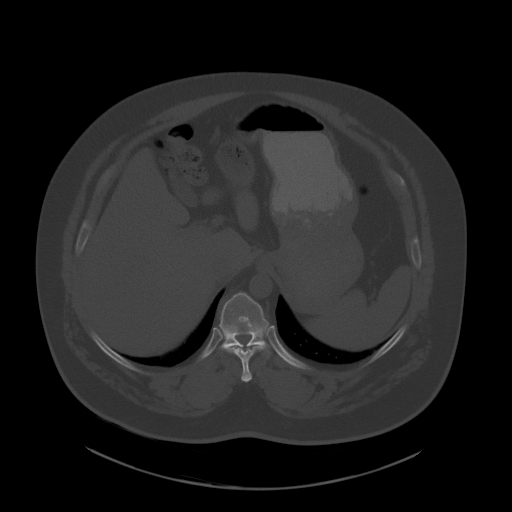
[im 93/107  soft-tissue]
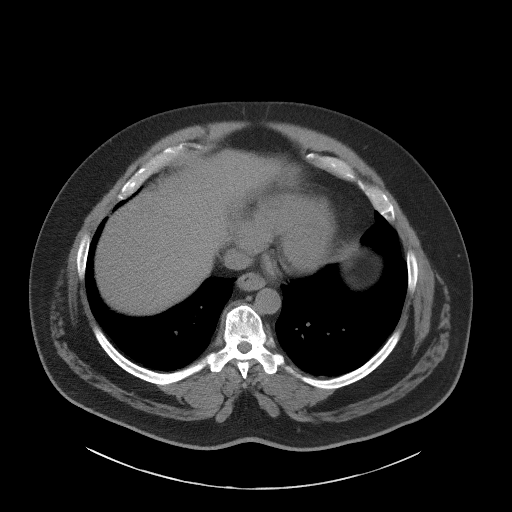
[im 102/107  soft-tissue]
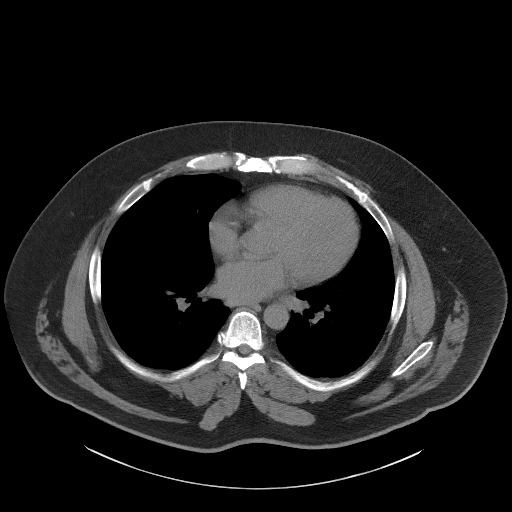

[Series 5: coronal st · coronal · 0.86mm/px · 3 of 119 slices shown]
[im 40/119  soft-tissue]
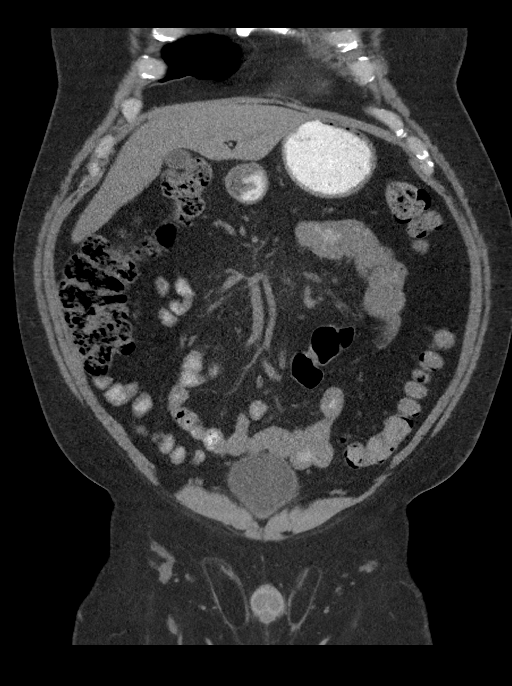
[im 53/119  soft-tissue]
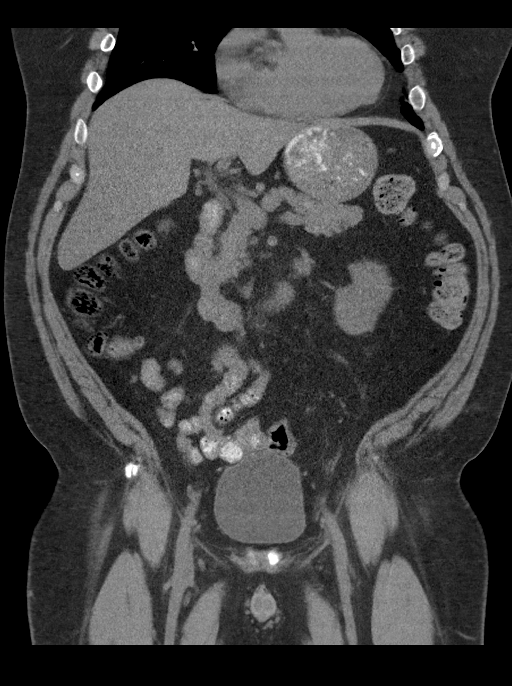
[im 66/119  soft-tissue]
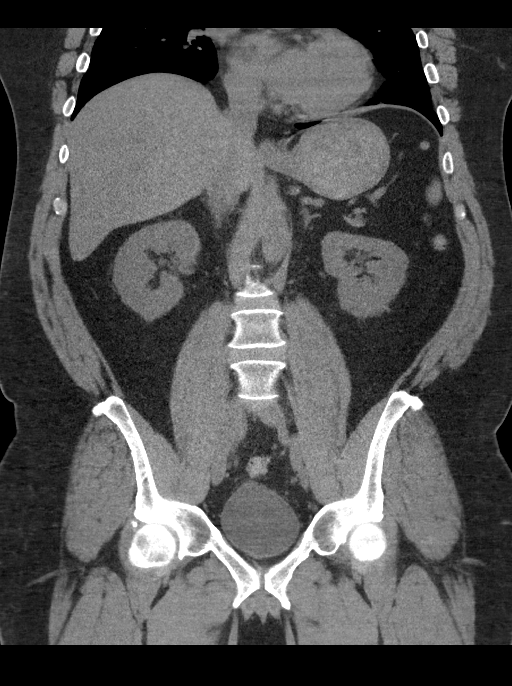

[14 of 46 positions shown; findings below may reference images not displayed]

FINDINGS: The lack of intravenous contrast limits the ability to evaluate
solid abdominal organs.

Lower chest: Limited visualization of lower thorax demonstrates
minimal dependent subpleural ground-glass atelectasis. No discrete
focal airspace opacities. No pleural effusion.

Normal heart size. Coronary artery calcifications. No pericardial
effusion.

Hepatobiliary: Normal hepatic contour. Normal appearance of the
gallbladder given underdistention. No radiopaque gallstones. No
ascites.

Pancreas: Normal noncontrast appearance of the pancreas

Spleen: Normal noncontrast appearance of the spleen. Incidental note
is made of a tiny splenule.

Adrenals/Urinary Tract: Normal noncontrast appearance of the
bilateral kidneys. No renal stones. No renal stones are seen along
the expected course of either ureter or the urinary bladder. Normal
noncontrast appearance of the urinary bladder given degree
distention. No urinary obstruction or perinephric stranding.

Normal noncontrast appearance of the bilateral adrenal glands.

Stomach/Bowel: Small hiatal hernia. Moderate colonic stool burden
without evidence of enteric obstruction. There is suspected mild
diffuse bowel wall thickening involving several loops of jejunum
within the midline of the mid/lower abdomen (representative images
45, 60 and 67, series 2), not resulting in enteric obstruction.
Normal noncontrast appearance of the terminal ileum and retrocecal
appendix. No pneumoperitoneum, pneumatosis or portal venous gas.

Vascular/Lymphatic: Minimal amount of atherosclerotic plaque within
a normal caliber abdominal aorta. Prominent porta hepatis lymph node
measures approximately 1.4 cm in greatest short axis diameter (image
33, series 2), unchanged since the 5555 examination and thus of
benign etiology.

Interval development of ill-defined misty appearance of the
abdominal mesentery(representative image 46, series 2). Associated
scattered mesenteric lymph nodes are numerous though individually
not enlarged by size criteria with index mesenteric node measuring
0.7 cm in greatest short axis diameter. No bulky retroperitoneal,
mesenteric, pelvic or inguinal lymphadenopathy.

Reproductive: Dystrophic calcifications within a normal sized
prostate gland. No free fluid within the pelvic cul-de-sac.

Other: Small bilateral indirect mesenteric fat containing inguinal
hernias unchanged.

Musculoskeletal: No acute or aggressive osseous abnormalities.
Bilateral facet degenerative change of the lower lumbar spine. Note
is made of bilateral os acetabuli.
IMPRESSION: 1. Suspected mild diffuse bowel wall thickening involving several
loops of jejunum the midline of the mid/lower abdomen, not resulting
in enteric obstruction. Differential considerations are broad though
include both infectious and inflammatory etiologies. Clinical
correlation is advised.
2. Otherwise, no explanation for patient's radiating abdominal and
flank pain. Specifically, no evidence of nephrolithiasis, urinary or
enteric obstruction.
3. Mild misty appearance of the abdominal mesentery without
associated adenopathy favored to be reactive in etiology.
4. Small hiatal hernia.
5. Coronary artery calcifications. Aortic Atherosclerosis
(BA4V3-170.0)

## 2017-08-29 DIAGNOSIS — H401131 Primary open-angle glaucoma, bilateral, mild stage: Secondary | ICD-10-CM | POA: Diagnosis not present

## 2017-08-31 DIAGNOSIS — G4733 Obstructive sleep apnea (adult) (pediatric): Secondary | ICD-10-CM | POA: Diagnosis not present

## 2017-10-01 DIAGNOSIS — G4733 Obstructive sleep apnea (adult) (pediatric): Secondary | ICD-10-CM | POA: Diagnosis not present

## 2017-10-31 DIAGNOSIS — G4733 Obstructive sleep apnea (adult) (pediatric): Secondary | ICD-10-CM | POA: Diagnosis not present

## 2017-11-07 DIAGNOSIS — G4733 Obstructive sleep apnea (adult) (pediatric): Secondary | ICD-10-CM | POA: Diagnosis not present

## 2017-11-22 DIAGNOSIS — Z719 Counseling, unspecified: Secondary | ICD-10-CM | POA: Diagnosis not present

## 2017-12-01 DIAGNOSIS — Z719 Counseling, unspecified: Secondary | ICD-10-CM | POA: Diagnosis not present

## 2017-12-01 DIAGNOSIS — G4733 Obstructive sleep apnea (adult) (pediatric): Secondary | ICD-10-CM | POA: Diagnosis not present

## 2017-12-08 DIAGNOSIS — Z719 Counseling, unspecified: Secondary | ICD-10-CM | POA: Diagnosis not present

## 2017-12-12 DIAGNOSIS — S00451A Superficial foreign body of right ear, initial encounter: Secondary | ICD-10-CM | POA: Diagnosis not present

## 2017-12-15 DIAGNOSIS — Z719 Counseling, unspecified: Secondary | ICD-10-CM | POA: Diagnosis not present

## 2017-12-22 DIAGNOSIS — Z719 Counseling, unspecified: Secondary | ICD-10-CM | POA: Diagnosis not present

## 2018-01-01 DIAGNOSIS — G4733 Obstructive sleep apnea (adult) (pediatric): Secondary | ICD-10-CM | POA: Diagnosis not present

## 2018-01-02 DIAGNOSIS — Z719 Counseling, unspecified: Secondary | ICD-10-CM | POA: Diagnosis not present

## 2018-01-05 DIAGNOSIS — Z719 Counseling, unspecified: Secondary | ICD-10-CM | POA: Diagnosis not present

## 2018-01-12 DIAGNOSIS — Z719 Counseling, unspecified: Secondary | ICD-10-CM | POA: Diagnosis not present

## 2018-01-13 DIAGNOSIS — H401131 Primary open-angle glaucoma, bilateral, mild stage: Secondary | ICD-10-CM | POA: Diagnosis not present

## 2018-01-29 DIAGNOSIS — G4733 Obstructive sleep apnea (adult) (pediatric): Secondary | ICD-10-CM | POA: Diagnosis not present

## 2018-02-01 DIAGNOSIS — H401131 Primary open-angle glaucoma, bilateral, mild stage: Secondary | ICD-10-CM | POA: Diagnosis not present

## 2018-02-14 DIAGNOSIS — G4733 Obstructive sleep apnea (adult) (pediatric): Secondary | ICD-10-CM | POA: Diagnosis not present

## 2018-03-01 DIAGNOSIS — G4733 Obstructive sleep apnea (adult) (pediatric): Secondary | ICD-10-CM | POA: Diagnosis not present

## 2018-03-31 DIAGNOSIS — G4733 Obstructive sleep apnea (adult) (pediatric): Secondary | ICD-10-CM | POA: Diagnosis not present

## 2018-04-25 DIAGNOSIS — I10 Essential (primary) hypertension: Secondary | ICD-10-CM | POA: Diagnosis not present

## 2018-04-25 DIAGNOSIS — Z125 Encounter for screening for malignant neoplasm of prostate: Secondary | ICD-10-CM | POA: Diagnosis not present

## 2018-04-25 DIAGNOSIS — E782 Mixed hyperlipidemia: Secondary | ICD-10-CM | POA: Diagnosis not present

## 2018-05-01 DIAGNOSIS — G4733 Obstructive sleep apnea (adult) (pediatric): Secondary | ICD-10-CM | POA: Diagnosis not present

## 2018-05-04 DIAGNOSIS — Z0000 Encounter for general adult medical examination without abnormal findings: Secondary | ICD-10-CM | POA: Diagnosis not present

## 2018-05-04 DIAGNOSIS — R413 Other amnesia: Secondary | ICD-10-CM | POA: Diagnosis not present

## 2018-05-04 DIAGNOSIS — I10 Essential (primary) hypertension: Secondary | ICD-10-CM | POA: Diagnosis not present

## 2018-06-05 DIAGNOSIS — H401131 Primary open-angle glaucoma, bilateral, mild stage: Secondary | ICD-10-CM | POA: Diagnosis not present

## 2018-08-17 DIAGNOSIS — G4733 Obstructive sleep apnea (adult) (pediatric): Secondary | ICD-10-CM | POA: Diagnosis not present

## 2018-09-07 ENCOUNTER — Other Ambulatory Visit: Payer: Self-pay | Admitting: Gastroenterology

## 2018-09-07 ENCOUNTER — Ambulatory Visit
Admission: RE | Admit: 2018-09-07 | Discharge: 2018-09-07 | Disposition: A | Discharge status: Home / Self Care | Payer: Managed Care, Other (non HMO) | Source: Ambulatory Visit | Attending: Gastroenterology | Admitting: Gastroenterology

## 2018-09-07 DIAGNOSIS — K5901 Slow transit constipation: Secondary | ICD-10-CM

## 2018-09-07 DIAGNOSIS — K5900 Constipation, unspecified: Secondary | ICD-10-CM | POA: Diagnosis not present

## 2018-09-14 DIAGNOSIS — Z719 Counseling, unspecified: Secondary | ICD-10-CM | POA: Diagnosis not present

## 2018-09-21 DIAGNOSIS — Z719 Counseling, unspecified: Secondary | ICD-10-CM | POA: Diagnosis not present

## 2018-10-03 DIAGNOSIS — Z719 Counseling, unspecified: Secondary | ICD-10-CM | POA: Diagnosis not present

## 2018-10-05 DIAGNOSIS — H401131 Primary open-angle glaucoma, bilateral, mild stage: Secondary | ICD-10-CM | POA: Diagnosis not present

## 2018-10-09 DIAGNOSIS — K5901 Slow transit constipation: Secondary | ICD-10-CM | POA: Diagnosis not present

## 2018-10-09 DIAGNOSIS — Z719 Counseling, unspecified: Secondary | ICD-10-CM | POA: Diagnosis not present

## 2018-11-07 DIAGNOSIS — I10 Essential (primary) hypertension: Secondary | ICD-10-CM | POA: Diagnosis not present

## 2018-11-07 DIAGNOSIS — R7301 Impaired fasting glucose: Secondary | ICD-10-CM | POA: Diagnosis not present

## 2018-11-07 DIAGNOSIS — Z23 Encounter for immunization: Secondary | ICD-10-CM | POA: Diagnosis not present

## 2018-11-07 DIAGNOSIS — E782 Mixed hyperlipidemia: Secondary | ICD-10-CM | POA: Diagnosis not present

## 2018-11-07 DIAGNOSIS — K5901 Slow transit constipation: Secondary | ICD-10-CM | POA: Diagnosis not present

## 2018-11-16 DIAGNOSIS — G4733 Obstructive sleep apnea (adult) (pediatric): Secondary | ICD-10-CM | POA: Diagnosis not present

## 2018-12-04 DIAGNOSIS — G4733 Obstructive sleep apnea (adult) (pediatric): Secondary | ICD-10-CM | POA: Diagnosis not present

## 2019-01-01 DIAGNOSIS — Z6841 Body Mass Index (BMI) 40.0 and over, adult: Secondary | ICD-10-CM | POA: Diagnosis not present

## 2019-01-31 DIAGNOSIS — M48062 Spinal stenosis, lumbar region with neurogenic claudication: Secondary | ICD-10-CM | POA: Diagnosis not present

## 2019-01-31 DIAGNOSIS — I10 Essential (primary) hypertension: Secondary | ICD-10-CM | POA: Diagnosis not present

## 2019-01-31 DIAGNOSIS — Z6841 Body Mass Index (BMI) 40.0 and over, adult: Secondary | ICD-10-CM | POA: Diagnosis not present

## 2019-02-01 ENCOUNTER — Other Ambulatory Visit: Payer: Self-pay

## 2019-02-01 ENCOUNTER — Encounter (INDEPENDENT_AMBULATORY_CARE_PROVIDER_SITE_OTHER): Payer: Self-pay

## 2019-02-05 DIAGNOSIS — H5213 Myopia, bilateral: Secondary | ICD-10-CM | POA: Diagnosis not present

## 2019-02-05 DIAGNOSIS — H524 Presbyopia: Secondary | ICD-10-CM | POA: Diagnosis not present

## 2019-02-05 DIAGNOSIS — H401131 Primary open-angle glaucoma, bilateral, mild stage: Secondary | ICD-10-CM | POA: Diagnosis not present

## 2019-02-14 ENCOUNTER — Ambulatory Visit (INDEPENDENT_AMBULATORY_CARE_PROVIDER_SITE_OTHER): Payer: Self-pay | Admitting: Bariatrics

## 2019-02-15 DIAGNOSIS — G4733 Obstructive sleep apnea (adult) (pediatric): Secondary | ICD-10-CM | POA: Diagnosis not present

## 2019-02-28 ENCOUNTER — Ambulatory Visit (INDEPENDENT_AMBULATORY_CARE_PROVIDER_SITE_OTHER): Payer: Self-pay | Admitting: Bariatrics

## 2019-04-10 ENCOUNTER — Ambulatory Visit (INDEPENDENT_AMBULATORY_CARE_PROVIDER_SITE_OTHER): Payer: 59 | Admitting: Family Medicine

## 2019-04-10 ENCOUNTER — Encounter (INDEPENDENT_AMBULATORY_CARE_PROVIDER_SITE_OTHER): Payer: Self-pay | Admitting: Family Medicine

## 2019-04-10 ENCOUNTER — Other Ambulatory Visit: Payer: Self-pay

## 2019-04-10 VITALS — BP 158/104 | HR 74 | Temp 98.0°F | Ht 69.0 in | Wt 308.0 lb

## 2019-04-10 DIAGNOSIS — E7849 Other hyperlipidemia: Secondary | ICD-10-CM

## 2019-04-10 DIAGNOSIS — F3289 Other specified depressive episodes: Secondary | ICD-10-CM | POA: Diagnosis not present

## 2019-04-10 DIAGNOSIS — Z0289 Encounter for other administrative examinations: Secondary | ICD-10-CM

## 2019-04-10 DIAGNOSIS — I10 Essential (primary) hypertension: Secondary | ICD-10-CM

## 2019-04-10 DIAGNOSIS — R5383 Other fatigue: Secondary | ICD-10-CM | POA: Diagnosis not present

## 2019-04-10 DIAGNOSIS — Z9189 Other specified personal risk factors, not elsewhere classified: Secondary | ICD-10-CM

## 2019-04-10 DIAGNOSIS — R0602 Shortness of breath: Secondary | ICD-10-CM | POA: Diagnosis not present

## 2019-04-10 DIAGNOSIS — E559 Vitamin D deficiency, unspecified: Secondary | ICD-10-CM

## 2019-04-10 DIAGNOSIS — R739 Hyperglycemia, unspecified: Secondary | ICD-10-CM | POA: Diagnosis not present

## 2019-04-10 DIAGNOSIS — Z6841 Body Mass Index (BMI) 40.0 and over, adult: Secondary | ICD-10-CM

## 2019-04-11 LAB — COMPREHENSIVE METABOLIC PANEL
ALT: 29 IU/L (ref 0–44)
AST: 28 IU/L (ref 0–40)
Albumin/Globulin Ratio: 1.7 (ref 1.2–2.2)
Albumin: 4.3 g/dL (ref 3.8–4.9)
Alkaline Phosphatase: 55 IU/L (ref 39–117)
BUN/Creatinine Ratio: 10 (ref 9–20)
BUN: 12 mg/dL (ref 6–24)
Bilirubin Total: 0.4 mg/dL (ref 0.0–1.2)
CO2: 21 mmol/L (ref 20–29)
Calcium: 9.3 mg/dL (ref 8.7–10.2)
Chloride: 104 mmol/L (ref 96–106)
Creatinine, Ser: 1.15 mg/dL (ref 0.76–1.27)
GFR calc Af Amer: 83 mL/min/{1.73_m2} (ref >59)
GFR calc non Af Amer: 72 mL/min/{1.73_m2} (ref >59)
Globulin, Total: 2.5 g/dL (ref 1.5–4.5)
Glucose: 99 mg/dL (ref 65–99)
Potassium: 3.8 mmol/L (ref 3.5–5.2)
Sodium: 139 mmol/L (ref 134–144)
Total Protein: 6.8 g/dL (ref 6.0–8.5)

## 2019-04-11 LAB — CBC WITH DIFFERENTIAL
Basophils Absolute: 0.1 10*3/uL (ref 0.0–0.2)
Basos: 1 %
EOS (ABSOLUTE): 0.5 10*3/uL — ABNORMAL HIGH (ref 0.0–0.4)
Eos: 8 %
Hematocrit: 39.2 % (ref 37.5–51.0)
Hemoglobin: 13.4 g/dL (ref 13.0–17.7)
Immature Grans (Abs): 0 10*3/uL (ref 0.0–0.1)
Immature Granulocytes: 0 %
Lymphocytes Absolute: 1.5 10*3/uL (ref 0.7–3.1)
Lymphs: 24 %
MCH: 28.1 pg (ref 26.6–33.0)
MCHC: 34.2 g/dL (ref 31.5–35.7)
MCV: 82 fL (ref 79–97)
Monocytes Absolute: 0.5 10*3/uL (ref 0.1–0.9)
Monocytes: 9 %
Neutrophils Absolute: 3.4 10*3/uL (ref 1.4–7.0)
Neutrophils: 58 %
RBC: 4.77 x10E6/uL (ref 4.14–5.80)
RDW: 13.2 % (ref 11.6–15.4)
WBC: 6 10*3/uL (ref 3.4–10.8)

## 2019-04-11 LAB — FOLATE: Folate: 3.8 ng/mL (ref >3.0)

## 2019-04-11 LAB — LIPID PANEL WITH LDL/HDL RATIO
Cholesterol, Total: 182 mg/dL (ref 100–199)
HDL: 43 mg/dL (ref >39)
LDL Calculated: 111 mg/dL — ABNORMAL HIGH (ref 0–99)
LDl/HDL Ratio: 2.6 ratio (ref 0.0–3.6)
Triglycerides: 141 mg/dL (ref 0–149)
VLDL Cholesterol Cal: 28 mg/dL (ref 5–40)

## 2019-04-11 LAB — T3: T3, Total: 156 ng/dL (ref 71–180)

## 2019-04-11 LAB — VITAMIN D 25 HYDROXY (VIT D DEFICIENCY, FRACTURES): Vit D, 25-Hydroxy: 37.8 ng/mL (ref 30.0–100.0)

## 2019-04-11 LAB — VITAMIN B12: Vitamin B-12: 318 pg/mL (ref 232–1245)

## 2019-04-11 LAB — HEMOGLOBIN A1C
Est. average glucose Bld gHb Est-mCnc: 123 mg/dL
Hgb A1c MFr Bld: 5.9 % — ABNORMAL HIGH (ref 4.8–5.6)

## 2019-04-11 LAB — INSULIN, RANDOM: INSULIN: 30.9 u[IU]/mL — ABNORMAL HIGH (ref 2.6–24.9)

## 2019-04-11 LAB — TSH: TSH: 1.34 u[IU]/mL (ref 0.450–4.500)

## 2019-04-11 LAB — T4, FREE: Free T4: 1.17 ng/dL (ref 0.82–1.77)

## 2019-04-11 NOTE — Progress Notes (Signed)
.  Office: [phone]  /  Fax: [phone]   HPI:   Chief Complaint: OBESITY  Kenneth Green (MR# [id number]) is a 53 y.o. male who presents on 04/10/2019 for obesity evaluation and treatment. Current BMI is Body mass index is 45.48 kg/m. Kenneth Green has struggled with obesity for years and has been unsuccessful in either losing weight or maintaining long term weight loss. Kenneth Green attended our information session and states he is currently in the action stage of change and ready to dedicate time achieving and maintaining a healthier weight.   Kenneth Green states his family eats meals together he thinks his family will eat healthier with him his desired weight loss is 83 lbs he has been heavy most of his life he started gaining weight at 53 years old. his heaviest weight ever was 325 lbs he has significant food cravings issues  he snacks frequently in the evenings he skips breakfast daily he is frequently drinking liquids with calories he frequently makes poor food choices he frequently eats larger portions than normal  he has binge eating behaviors he struggles with emotional eating   Kenneth Green has been diagnosed with binge eating disorder in the past by his PCP, but has not had any treatment yet. 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   [name] feels his energy is lower than it should be. This has worsened with weight gain and has not worsened recently. [name] denies daytime somnolence and  denies waking up still tired. Patient is at risk for obstructive sleep apnea. Patent has a history of symptoms of hypertension. Patient generally gets 6 or 7 hours of sleep per night, and states they generally have generally restful sleep. Snoring is present. Apneic episodes are present. Epworth Sleepiness Score is 9.  Dyspnea on exertion Kenneth Green notes increasing shortness of breath with exercising and seems to be worsening over time with weight gain. He notes getting out of breath sooner  with activity than he used to. This has not gotten worse recently. Kenneth Green denies orthopnea.  Hyperglycemia Kenneth Green has a history of some elevated fasting blood glucose readings without a history of diabetes. He is not on medications.  At risk for diabetes Kenneth Green is at higher than average risk for developing diabetes due to his hyperglycemia and obesity.   Hypertension Kenneth Green is a 53 y.o. male with hypertension. Nole's blood pressure is elevated today on medications. He is working on weight loss to help control his blood pressure with the goal of decreasing his risk of heart attack and stroke. Kenneth Green denies chest pain or headache.  Depression (Major Depressive Disorder) Kenneth Green is on Wellbutrin and he scored an 18 on his PHQ-9. He has been diagnosed with binge eating disorder by his PCP. He is struggling with emotional eating and using food for comfort to the extent that it is negatively impacting his health. He often snacks when he is not hungry. Kenneth Green sometimes feels he is out of control and then feels guilty that he made poor food choices. He has been working on behavior modification techniques to help reduce his emotional eating and has been somewhat successful. He shows no sign of suicidal or homicidal ideations.  Hyperlipidemia Kenneth Green has hyperlipidemia and is on a statin. He has been trying to improve his cholesterol levels with intensive lifestyle modification including a low saturated fat diet, exercise, and weight loss. He denies any chest pain or myalgias.  Vitamin D Deficiency Kenneth Green has a diagnosis of vitamin D deficiency. He is  currently on OTC vit D and does not have recent labs. Kenneth Green admits fatigue and denies nausea, vomiting, or muscle weakness.  Depression Screen Kenneth Green's Food and Mood (modified PHQ-9) score was 18. Depression screen PHQ 2/9 04/10/2019  Decreased Interest 3  Down, Depressed, Hopeless 2  PHQ - 2 Score 5  Altered sleeping 1  Tired,  decreased energy 3  Change in appetite 3  Feeling bad or failure about yourself  2  Trouble concentrating 1  Moving slowly or fidgety/restless 3  Suicidal thoughts 0  PHQ-9 Score 18  Difficult doing work/chores Somewhat difficult   ASSESSMENT AND PLAN:  Other fatigue - Plan: EKG 12-Lead, Vitamin B12, CBC With Differential, Comprehensive metabolic panel, Folate, T3, T4, free, TSH  Shortness of breath on exertion  Hyperglycemia - Plan: Hemoglobin A1c, Insulin, random  Essential hypertension  Other hyperlipidemia - Plan: Lipid Panel With LDL/HDL Ratio  Vitamin D deficiency - Plan: VITAMIN D 25 Hydroxy (Vit-D Deficiency, Fractures)  Other depression - with emotional eating  At risk for diabetes mellitus  Class 3 severe obesity with serious comorbidity and body mass index (BMI) of 45.0 to 49.9 in adult, unspecified obesity type ([place])  PLAN:  Fatigue Kenneth Green was informed that his fatigue may be related to obesity, depression or many other causes. Labs will be ordered, and in the meanwhile, Kenneth Green has agreed to work on diet, exercise and weight loss to help with fatigue. Proper sleep hygiene was discussed including the need for 7-8 hours of quality sleep each night. A sleep study was not ordered based on symptoms and Epworth score. An EKG and an indirect calorimetry was ordered today. Kenneth Green agrees to follow up in 2 weeks.  Dyspnea on exertion Junior's shortness of breath appears to be obesity related and exercise induced. He has agreed to work on weight loss and gradually increase exercise to treat his exercise induced shortness of breath. If Kenneth Green follows our instructions and loses weight without improvement of his shortness of breath, we will plan to refer to pulmonology. We will monitor this condition regularly. [name] agrees to this plan.  Hyperglycemia Fasting labs will be obtained and results with be discussed with Kenneth Green in 2 weeks at his follow up visit. In the  meanwhile, Kenneth Green was started on a lower simple carbohydrate diet and will work on weight loss efforts.  Diabetes risk counseling Kenneth Green was given extended (30 minutes) diabetes prevention counseling today. He is 52 y.o. male and has risk factors for diabetes including hyperglycemia and obesity. We discussed intensive lifestyle modifications today with an emphasis on weight loss as well as increasing exercise and decreasing simple carbohydrates in his diet.  Hypertension We discussed sodium restriction, working on healthy weight loss, and a regular exercise program as the means to achieve improved blood pressure control. We will continue to monitor his blood pressure as well as his progress with the above lifestyle modifications. Labs will be ordered today. He will continue his medications as prescribed and to start his diet prescription. He will watch for signs of hypotension as he continues his lifestyle modifications. Mathius agreed with this plan and agreed to follow up as directed in 2 weeks.  Hyperlipidemia Wake was informed of the American Heart Association Guidelines emphasizing intensive lifestyle modifications as the first line treatment for hyperlipidemia. We discussed many lifestyle modifications today in depth, and Burr will continue to work on decreasing saturated fats such as fatty red meat, butter and many fried foods. He will also increase vegetables and lean  protein in his diet and continue to work on exercise and weight loss efforts. We will order labs today. Josealberto agrees to continue his medications and to start his diet prescription. He agrees to follow up in 2 weeks to monitor his progress.  Vitamin D Deficiency Jevonte was informed that low vitamin D levels contribute to fatigue and are associated with obesity, breast, and colon cancer. Camille will have labs drawn today and he agrees to follow up in 2 weeks as directed.  Depression (Major Depressive Disorder) We  discussed behavior modification techniques today to help Clayvon deal with his emotional eating and depression. Kincade was referred to Dr. Mallie Mussel, our bariatric psychologist for evaluation due to elevated PHQ-9 score and significant struggles with emotional eating. He agreed to follow up as directed.  Depression Screen Charon had a strongly positive depression screening. Depression is commonly associated with obesity and often results in emotional eating behaviors. We will monitor this closely and work on CBT to help improve the non-hunger eating patterns. Referral to Psychology may be required if no improvement is seen as he continues in our clinic.  Obesity Cadan is currently in the action stage of change and his goal is to continue with weight loss efforts. He has agreed to follow the Category 4 plan. Thadeus has been instructed to work up to a goal of 150 minutes of combined cardio and strengthening exercise per week for weight loss and overall health benefits. We discussed the following Behavioral Modification Strategies today: increasing lean protein intake, decreasing simple carbohydrates, no skipping meals, and work on meal planning and easy cooking plans.  Jonaven has agreed to follow up with our clinic in 2 weeks. He was informed of the importance of frequent follow up visits to maximize his success with intensive lifestyle modifications for his multiple health conditions. He was informed we would discuss his lab results at his next visit unless there is a critical issue that needs to be addressed sooner. Vernice agreed to keep his next visit at the agreed upon time to discuss these results.  ALLERGIES: Allergies  Allergen Reactions   Iohexol Hives and Rash    Patient needs pre medication per Dr. Zigmund Daniel    MEDICATIONS: Current Outpatient Medications on File Prior to Visit  Medication Sig Dispense Refill   benazepril-hydrochlorthiazide (LOTENSIN HCT) 10-12.5 MG per tablet        buPROPion (WELLBUTRIN XL) 150 MG 24 hr tablet      Cholecalciferol (VITAMIN D PO) Take by mouth. Take 1000 iu daily     fexofenadine (ALLEGRA) 180 MG tablet Take 180 mg by mouth daily.     Flaxseed, Linseed, (FLAX PO) Take 1 capsule by mouth daily.     gabapentin (NEURONTIN) 300 MG capsule Take 300 mg by mouth 3 (three) times daily.     No current facility-administered medications on file prior to visit.     PAST MEDICAL HISTORY: Past Medical History:  Diagnosis Date   Anxiety    Back pain    Binge eating disorder    Dyspnea    Glaucoma    Hypertension    Lower extremity edema    Major depressive disorder, recurrent, unspecified ([place])    Mixed hyperlipidemia    Obesity    OSA (obstructive sleep apnea)    Prediabetes    Slow transit constipation     PAST SURGICAL HISTORY: Past Surgical History:  Procedure Laterality Date   SPINAL FUSION      SOCIAL HISTORY: Social History  Tobacco Use   Smoking status: Former Smoker   Smokeless tobacco: Never Used  Substance Use Topics   Alcohol use: Yes    Comment: 2-3 beers weekly   Drug use: No    FAMILY HISTORY: Family History  Problem Relation Age of Onset   Heart disease Mother    Hypertension Mother    Kidney disease Mother    Obesity Mother    Stroke Father    Hypertension Father    Heart disease Father    Sleep apnea Father    Obesity Father    Heart disease Sister    Diabetes Sister     ROS: Review of Systems  Constitutional: Positive for malaise/fatigue.  HENT: Positive for hearing loss and tinnitus.        Positive for nasal stuffiness. Positive for neck pain. Positive dry mouth.  Eyes:       Wears glasses or contacts. Positive for floaters.  Respiratory: Shortness of breath:  with activity.   Cardiovascular: Positive for chest pain.       Positive for calf and leg pain with walking. Positive for cold feet and hands.  Gastrointestinal: Positive for  constipation. Negative for nausea and vomiting.  Musculoskeletal: Positive for back pain, joint pain and myalgias.       Negative for muscle weakness.  Neurological: Positive for weakness. Negative for headaches.  Endo/Heme/Allergies:       Positive for hyperglycemia. Positive for heat intolerance.  Psychiatric/Behavioral: Positive for depression. Negative for suicidal ideas.       Positive for stress. Negative for homicidal ideations.    PHYSICAL EXAM: Blood pressure (!) 158/104, pulse 74, temperature 98 F (36.7 C), temperature source Oral, height 5\' 9"  (1.753 m), weight (!) 308 lb (139.7 kg), SpO2 99 %. Body mass index is 45.48 kg/m. Physical Exam Vitals signs reviewed.  Constitutional:      Appearance: Normal appearance. He is obese.  HENT:     Head: Normocephalic and atraumatic.     Nose: Nose normal.  Eyes:     General: No scleral icterus.    Extraocular Movements: Extraocular movements intact.  Neck:     Musculoskeletal: Normal range of motion and neck supple.     Thyroid: No thyromegaly.     Comments: Negative for thyromegaly. Cardiovascular:     Rate and Rhythm: Normal rate and regular rhythm.  Pulmonary:     Effort: Pulmonary effort is normal. No respiratory distress.  Abdominal:     Palpations: Abdomen is soft.     Tenderness: There is no abdominal tenderness.     Comments: Positive for obesity.  Musculoskeletal:     Comments: ROM normal in all extremities.  Skin:    General: Skin is warm and dry.  Neurological:     Mental Status: He is alert and oriented to person, place, and time.     Coordination: Coordination normal.  Psychiatric:        Mood and Affect: Mood normal.        Behavior: Behavior normal.     RECENT LABS AND TESTS: BMET    Component Value Date/Time   NA 139 04/10/2019 1016   K 3.8 04/10/2019 1016   CL 104 04/10/2019 1016   CO2 21 04/10/2019 1016   GLUCOSE 99 04/10/2019 1016   GLUCOSE 106 (H) 06/15/2016 1700   BUN 12 04/10/2019  1016   CREATININE 1.15 04/10/2019 1016   CALCIUM 9.3 04/10/2019 1016   GFRNONAA 72 04/10/2019 1016   GFRAA  83 04/10/2019 1016   Lab Results  Component Value Date   HGBA1C 5.9 (H) 04/10/2019   Lab Results  Component Value Date   INSULIN 30.9 (H) 04/10/2019   CBC    Component Value Date/Time   WBC 6.0 04/10/2019 1016   WBC 7.7 06/15/2016 1700   RBC 4.77 04/10/2019 1016   RBC 4.87 06/15/2016 1700   HGB 13.4 04/10/2019 1016   HCT 39.2 04/10/2019 1016   PLT 235 06/15/2016 1700   MCV 82 04/10/2019 1016   MCH 28.1 04/10/2019 1016   MCH 29.0 06/15/2016 1700   MCHC 34.2 04/10/2019 1016   MCHC 34.6 06/15/2016 1700   RDW 13.2 04/10/2019 1016   LYMPHSABS 1.5 04/10/2019 1016   MONOABS 0.7 06/15/2016 1700   EOSABS 0.5 (H) 04/10/2019 1016   BASOSABS 0.1 04/10/2019 1016   Iron/TIBC/Ferritin/ %Sat No results found for: IRON, TIBC, FERRITIN, IRONPCTSAT Lipid Panel     Component Value Date/Time   CHOL 182 04/10/2019 1016   TRIG 141 04/10/2019 1016   HDL 43 04/10/2019 1016   LDLCALC 111 (H) 04/10/2019 1016   Hepatic Function Panel     Component Value Date/Time   PROT 6.8 04/10/2019 1016   ALBUMIN 4.3 04/10/2019 1016   AST 28 04/10/2019 1016   ALT 29 04/10/2019 1016   ALKPHOS 55 04/10/2019 1016   BILITOT 0.4 04/10/2019 1016      Component Value Date/Time   TSH 1.340 04/10/2019 1016    ECG  shows NSR with a rate of 74 BPM. INDIRECT CALORIMETER done today shows a VO2 of 407 and a REE of 2831. His calculated basal metabolic rate is 2376 thus his basal metabolic rate is better than expected.  OBESITY BEHAVIORAL INTERVENTION VISIT  Today's visit was # 1   Starting weight: 308 lbs Starting date: 04/10/19 Today's weight : Weight: (!) 308 lb (139.7 kg)  Today's date: 04/10/2019 Total lbs lost to date: 0    04/10/2019  Height 5\' 9"  (1.753 m)  Weight 308 lb (139.7 kg) (A)  BMI (Calculated) 45.46  BLOOD PRESSURE - SYSTOLIC 283  BLOOD PRESSURE - DIASTOLIC 151  Waist  Measurement  54 inches   Body Fat % 39.1 %  Total Body Water (lbs) 135.4 lbs  RMR 2831    ASK: We discussed the diagnosis of obesity with Janalyn Shy today and Kenneth Green agreed to give [place] permission to discuss obesity behavioral modification therapy today.  ASSESS: Ivar has the diagnosis of obesity and his BMI today is 45.4. Sheridan is in the action stage of change   ADVISE: Race was educated on the multiple health risks of obesity as well as the benefit of weight loss to improve his health. He was advised of the need for long term treatment and the importance of lifestyle modifications to improve his current health and to decrease his risk of future health problems.  AGREE: Multiple dietary modification options and treatment options were discussed and Shion agreed to follow the recommendations documented in the above note.  ARRANGE: Lyn was educated on the importance of frequent visits to treat obesity as outlined per CMS and USPSTF guidelines and agreed to schedule his next follow up appointment today.   IMarcille Blanco, CMA, am acting as transcriptionist for Starlyn Skeans, MD I have reviewed the above documentation for accuracy and completeness, and I agree with the above. -Dennard Nip, MD

## 2019-04-24 ENCOUNTER — Ambulatory Visit (INDEPENDENT_AMBULATORY_CARE_PROVIDER_SITE_OTHER): Payer: 59 | Admitting: Family Medicine

## 2019-04-24 ENCOUNTER — Encounter (INDEPENDENT_AMBULATORY_CARE_PROVIDER_SITE_OTHER): Payer: Self-pay | Admitting: Family Medicine

## 2019-04-24 ENCOUNTER — Ambulatory Visit (INDEPENDENT_AMBULATORY_CARE_PROVIDER_SITE_OTHER): Payer: 59 | Admitting: Psychology

## 2019-04-24 ENCOUNTER — Other Ambulatory Visit: Payer: Self-pay

## 2019-04-24 VITALS — BP 144/86 | HR 69 | Temp 97.8°F | Ht 69.0 in | Wt 300.0 lb

## 2019-04-24 DIAGNOSIS — Z6841 Body Mass Index (BMI) 40.0 and over, adult: Secondary | ICD-10-CM

## 2019-04-24 DIAGNOSIS — Z9189 Other specified personal risk factors, not elsewhere classified: Secondary | ICD-10-CM | POA: Diagnosis not present

## 2019-04-24 DIAGNOSIS — F330 Major depressive disorder, recurrent, mild: Secondary | ICD-10-CM | POA: Diagnosis not present

## 2019-04-24 DIAGNOSIS — R7303 Prediabetes: Secondary | ICD-10-CM

## 2019-04-24 DIAGNOSIS — E7849 Other hyperlipidemia: Secondary | ICD-10-CM | POA: Diagnosis not present

## 2019-04-24 DIAGNOSIS — E559 Vitamin D deficiency, unspecified: Secondary | ICD-10-CM | POA: Diagnosis not present

## 2019-04-24 MED ORDER — VITAMIN D (ERGOCALCIFEROL) 1.25 MG (50000 UNIT) PO CAPS: 50000.0000 [IU] | ORAL_CAPSULE | ORAL | 0 refills | Status: DC

## 2019-04-24 NOTE — Progress Notes (Signed)
Office: [phone]  /  Fax: [phone]   HPI:   Chief Complaint: OBESITY Kenneth Green is here to discuss his progress with his obesity treatment plan. He is on the Category 4 plan and is following his eating plan approximately 96 % of the time. He states he is exercising 0 minutes 0 times per week. Kenneth Green has done very well with his Category 4 plan and he did very well eating all of his food options. He is here to discuss his lab results and next steps.  His weight is 300 lb (136.1 kg) today and has had a weight loss of 8 pounds over a period of 2 weeks since his last visit. He has lost 8 lbs since starting treatment with [place].  Hyperlipidemia Kenneth Green has a new diagnosis of hyperlipidemia and he is not on a statin. His LDL is elevated at 111 with normal triglycerides on 04/10/19. He has been trying to improve his cholesterol levels with intensive lifestyle modification including a low saturated fat diet, exercise, and weight loss.   Vitamin D Deficiency Kenneth Green has a diagnosis of vitamin D deficiency. He is currently on OTC vit D, but is not yet at goal. Kenneth Green denies nausea, vomiting, or muscle weakness.  Pre-Diabetes Kenneth Green has a diagnosis of pre-diabetes based on his elevated Hgb A1c of 5.9 and insulin of 30.9. He was informed this puts him at greater risk of developing diabetes. He did have polyphagia, but this has improved on his eating plan. He is not taking metformin currently and continues to work on diet and exercise to decrease risk of diabetes.   At risk for diabetes Kenneth Green is at higher than average risk for developing diabetes due to his pre-diabetes and obesity.   ASSESSMENT AND PLAN:  Vitamin D deficiency - Plan: Vitamin D, Ergocalciferol, (DRISDOL) 1.25 MG (50000 UT) CAPS capsule  Other hyperlipidemia  Prediabetes  At risk for diabetes mellitus  Class 3 severe obesity with serious comorbidity and body mass index (BMI) of 40.0 to 44.9 in adult, unspecified obesity  type (Kenneth Green)  PLAN:  Hyperlipidemia Kenneth Green was informed of the American Heart Association Guidelines emphasizing intensive lifestyle modifications as the first line treatment for hyperlipidemia. We discussed many lifestyle modifications today in depth, and Simmie will continue to work on decreasing saturated fats such as fatty red meat, butter and many fried foods. He will also increase vegetables and lean protein in his diet and continue to work on exercise and weight loss efforts. Kenneth Green agrees to continue his diet and exercise. We will recheck labs in 3 weeks and he agrees to follow up in 2 weeks.  Vitamin D Deficiency Kenneth Green was informed that low vitamin D levels contribute to fatigue and are associated with obesity, breast, and colon cancer. Kenneth Green agrees to continue to his OTC Vitamin D and to start prescription Vit D @50 ,000 IU every week #4 with no refills and will follow up for routine testing of vitamin D, at least 2-3 times per year. He was informed of the risk of over-replacement of vitamin D and agrees to not increase his dose unless he discusses this with [place] first. Kenneth Green agrees to follow up in 2 weeks as directed.  Pre-Diabetes Kenneth Green will continue to work on weight loss, exercise, and decreasing simple carbohydrates in his diet to help decrease the risk of diabetes. He was informed that eating too many simple carbohydrates or too many calories at one sitting increases the likelihood of GI side effects. Kenneth Green deferred metformin and agreed  to continue with his diet and exercise. Labs will be rechecked in 3 months. Kenneth Green agreed to follow up with [place] as directed to monitor his progress in 2 weeks.   Diabetes risk counseling Kenneth Green was given extended (30 minutes) diabetes prevention counseling today. He is 53 y.o. male and has risk factors for diabetes including pre-diabetes and obesity. We discussed intensive lifestyle modifications today with an emphasis on weight loss as well as  increasing exercise and decreasing simple carbohydrates in his diet.  Obesity Kenneth Green is currently in the action stage of change. As such, his goal is to continue with weight loss efforts.  He has agreed to follow the Category 4 plan. We discussed ways to keep his meals from being boring. Kenneth Green has been instructed to work up to a goal of 150 minutes of combined cardio and strengthening exercise per week for weight loss and overall health benefits. We discussed the following Behavioral Modification Strategies today: emotional eating strategies and keeping healthy foods in the home.  Kenneth Green has agreed to follow up with our clinic in 2 weeks. He was informed of the importance of frequent follow up visits to maximize his success with intensive lifestyle modifications for his multiple health conditions.  ALLERGIES: Allergies  Allergen Reactions  . Iohexol Hives and Rash    Patient needs pre medication per Dr. Zigmund Daniel    MEDICATIONS: Current Outpatient Medications on File Prior to Visit  Medication Sig Dispense Refill  . benazepril-hydrochlorthiazide (LOTENSIN HCT) 10-12.5 MG per tablet     . buPROPion (WELLBUTRIN XL) 150 MG 24 hr tablet     . Cholecalciferol (VITAMIN D PO) Take by mouth. Take 1000 iu daily    . fexofenadine (ALLEGRA) 180 MG tablet Take 180 mg by mouth daily.    . Flaxseed, Linseed, (FLAX PO) Take 1 capsule by mouth daily.    Marland Kitchen gabapentin (NEURONTIN) 300 MG capsule Take 300 mg by mouth 3 (three) times daily.     No current facility-administered medications on file prior to visit.     PAST MEDICAL HISTORY: Past Medical History:  Diagnosis Date  . Anxiety   . Back pain   . Binge eating disorder   . Dyspnea   . Glaucoma   . Hypertension   . Lower extremity edema   . Major depressive disorder, recurrent, unspecified ([place])   . Mixed hyperlipidemia   . Obesity   . OSA (obstructive sleep apnea)   . Prediabetes   . Slow transit constipation     PAST SURGICAL  HISTORY: Past Surgical History:  Procedure Laterality Date  . SPINAL FUSION      SOCIAL HISTORY: Social History   Tobacco Use  . Smoking status: Former Research scientist (life sciences)  . Smokeless tobacco: Never Used  Substance Use Topics  . Alcohol use: Yes    Comment: 2-3 beers weekly  . Drug use: No    FAMILY HISTORY: Family History  Problem Relation Age of Onset  . Heart disease Mother   . Hypertension Mother   . Kidney disease Mother   . Obesity Mother   . Stroke Father   . Hypertension Father   . Heart disease Father   . Sleep apnea Father   . Obesity Father   . Heart disease Sister   . Diabetes Sister     ROS: Review of Systems  Cardiovascular: Negative for chest pain.  Gastrointestinal: Negative for nausea and vomiting.  Musculoskeletal:       Negative for muscle weakness.  Endo/Heme/Allergies:  Positive for polyphagia.    PHYSICAL EXAM: Blood pressure (!) 144/86, pulse 69, temperature 97.8 F (36.6 C), temperature source Oral, height 5\' 9"  (1.753 m), weight 300 lb (136.1 kg), SpO2 97 %. Body mass index is 44.3 kg/m. Physical Exam  RECENT LABS AND TESTS: BMET    Component Value Date/Time   NA 139 04/10/2019 1016   K 3.8 04/10/2019 1016   CL 104 04/10/2019 1016   CO2 21 04/10/2019 1016   GLUCOSE 99 04/10/2019 1016   GLUCOSE 106 (H) 06/15/2016 1700   BUN 12 04/10/2019 1016   CREATININE 1.15 04/10/2019 1016   CALCIUM 9.3 04/10/2019 1016   GFRNONAA 72 04/10/2019 1016   GFRAA 83 04/10/2019 1016   Lab Results  Component Value Date   HGBA1C 5.9 (H) 04/10/2019   Lab Results  Component Value Date   INSULIN 30.9 (H) 04/10/2019   CBC    Component Value Date/Time   WBC 6.0 04/10/2019 1016   WBC 7.7 06/15/2016 1700   RBC 4.77 04/10/2019 1016   RBC 4.87 06/15/2016 1700   HGB 13.4 04/10/2019 1016   HCT 39.2 04/10/2019 1016   PLT 235 06/15/2016 1700   MCV 82 04/10/2019 1016   MCH 28.1 04/10/2019 1016   MCH 29.0 06/15/2016 1700   MCHC 34.2 04/10/2019 1016    MCHC 34.6 06/15/2016 1700   RDW 13.2 04/10/2019 1016   LYMPHSABS 1.5 04/10/2019 1016   MONOABS 0.7 06/15/2016 1700   EOSABS 0.5 (H) 04/10/2019 1016   BASOSABS 0.1 04/10/2019 1016   Iron/TIBC/Ferritin/ %Sat No results found for: IRON, TIBC, FERRITIN, IRONPCTSAT Lipid Panel     Component Value Date/Time   CHOL 182 04/10/2019 1016   Kenneth Green 141 04/10/2019 1016   HDL 43 04/10/2019 1016   LDLCALC 111 (H) 04/10/2019 1016   Hepatic Function Panel     Component Value Date/Time   PROT 6.8 04/10/2019 1016   ALBUMIN 4.3 04/10/2019 1016   AST 28 04/10/2019 1016   ALT 29 04/10/2019 1016   ALKPHOS 55 04/10/2019 1016   BILITOT 0.4 04/10/2019 1016      Component Value Date/Time   TSH 1.340 04/10/2019 1016   Results for CALVERT, CHARLAND (MRN [id number]) as of 04/24/2019 16:12  Ref. Range 04/10/2019 10:16  Vitamin D, 25-Hydroxy Latest Ref Range: 30.0 - 100.0 ng/mL 37.8    OBESITY BEHAVIORAL INTERVENTION VISIT  Today's visit was # 2  Starting weight: 308 lbs Starting date: 04/10/19 Today's weight : Weight: 300 lb (136.1 kg)  Today's date: 04/24/2019 Total lbs lost to date: 8    04/24/2019  Height 5\' 9"  (1.753 m)  Weight 300 lb (136.1 kg)  BMI (Calculated) 44.28  BLOOD PRESSURE - SYSTOLIC 102  BLOOD PRESSURE - DIASTOLIC 86   Body Fat % 38 %  Total Body Water (lbs) 132 lbs    ASK: We discussed the diagnosis of obesity with Janalyn Shy today and [name] agreed to give [place] permission to discuss obesity behavioral modification therapy today.  ASSESS: Kivon has the diagnosis of obesity and his BMI today is 44.28. Cutter is in the action stage of change.   ADVISE: Seyon was educated on the multiple health risks of obesity as well as the benefit of weight loss to improve his health. He was advised of the need for long term treatment and the importance of lifestyle modifications to improve his current health and to decrease his risk of future health problems.  AGREE: Multiple  dietary modification options and  treatment options were discussed and Kharter agreed to follow the recommendations documented in the above note.  ARRANGE: Olivier was educated on the importance of frequent visits to treat obesity as outlined per CMS and USPSTF guidelines and agreed to schedule his next follow up appointment today.  IMarcille Blanco, CMA, am acting as transcriptionist for Starlyn Skeans, MD  I have reviewed the above documentation for accuracy and completeness, and I agree with the above. -Dennard Nip, MD
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
## 2019-05-07 LAB — BASIC METABOLIC PANEL
BUN: 22 — AB (ref 4–21)
Creatinine: 1.2 (ref 0.6–1.3)
Glucose: 90
Potassium: 4.8 (ref 3.4–5.3)
Sodium: 139 (ref 137–147)

## 2019-05-07 LAB — HEPATIC FUNCTION PANEL
ALT: 27 (ref 10–40)
AST: 23 (ref 14–40)
Bilirubin, Total: 0.5

## 2019-05-07 LAB — LIPID PANEL
Cholesterol: 138 (ref 0–200)
HDL: 38 (ref 35–70)
LDL Cholesterol: 82
LDl/HDL Ratio: 3.6
Triglycerides: 91 (ref 40–160)

## 2019-05-07 LAB — HEMOGLOBIN A1C: Hemoglobin A1C: 5.7

## 2019-05-08 ENCOUNTER — Ambulatory Visit (INDEPENDENT_AMBULATORY_CARE_PROVIDER_SITE_OTHER): Payer: 59 | Admitting: Family Medicine

## 2019-05-08 ENCOUNTER — Other Ambulatory Visit: Payer: Self-pay

## 2019-05-08 ENCOUNTER — Encounter (INDEPENDENT_AMBULATORY_CARE_PROVIDER_SITE_OTHER): Payer: Self-pay | Admitting: Family Medicine

## 2019-05-08 VITALS — BP 142/85 | HR 71 | Temp 98.6°F | Ht 69.0 in | Wt 293.0 lb

## 2019-05-08 DIAGNOSIS — Z6841 Body Mass Index (BMI) 40.0 and over, adult: Secondary | ICD-10-CM

## 2019-05-08 DIAGNOSIS — Z9189 Other specified personal risk factors, not elsewhere classified: Secondary | ICD-10-CM | POA: Diagnosis not present

## 2019-05-08 DIAGNOSIS — F3289 Other specified depressive episodes: Secondary | ICD-10-CM | POA: Diagnosis not present

## 2019-05-08 DIAGNOSIS — R7303 Prediabetes: Secondary | ICD-10-CM

## 2019-05-08 DIAGNOSIS — E559 Vitamin D deficiency, unspecified: Secondary | ICD-10-CM | POA: Diagnosis not present

## 2019-05-08 MED ORDER — VITAMIN D (ERGOCALCIFEROL) 1.25 MG (50000 UNIT) PO CAPS: 50000.0000 [IU] | ORAL_CAPSULE | ORAL | 0 refills | Status: DC

## 2019-05-08 MED ORDER — BUPROPION HCL ER (SR) 200 MG PO TB12: 200.0000 mg | ORAL_TABLET | Freq: Two times a day (BID) | ORAL | 0 refills | Status: DC

## 2019-05-09 ENCOUNTER — Ambulatory Visit (INDEPENDENT_AMBULATORY_CARE_PROVIDER_SITE_OTHER): Payer: 59 | Admitting: Psychology

## 2019-05-09 ENCOUNTER — Other Ambulatory Visit: Payer: Self-pay

## 2019-05-09 DIAGNOSIS — F330 Major depressive disorder, recurrent, mild: Secondary | ICD-10-CM | POA: Diagnosis not present

## 2019-05-09 NOTE — Progress Notes (Signed)
Office: [phone]  /  Fax: [phone]   HPI:   Chief Complaint: OBESITY Kenneth Green is here to discuss his progress with his obesity treatment plan. He is on the Category 4 plan and is following his eating plan approximately 95 % of the time. He states he is exercising 0 minutes 0 times per week. Atsushi continues to do very well with his weight loss efforts. He is following his Category 4 plan closely, and hunger is controlled. He struggles with some emotional eating, but he is working on this. His weight is 293 lb (132.9 kg) today and has had a weight loss of 7 pounds over a period of 2 weeks since his last visit. He has lost 15 lbs since starting treatment with [place].  Pre-Diabetes Kenneth Green has a diagnosis of prediabetes based on his elevated Hgb A1c and was informed this puts him at greater risk of developing diabetes. Patient had an A1c due one month after starting his eating plan, and his A1c is already improving. Hamzeh has decreased polyphagia. He is not taking metformin currently and continues to work on diet and exercise to decrease risk of diabetes. He denies nausea, vomiting or hypoglycemia.  At risk for diabetes Kenneth Green is at higher than average risk for developing diabetes due to his obesity and prediabetes. He currently denies polyuria or polydipsia.  Vitamin D deficiency Kenneth Green has a diagnosis of vitamin D deficiency. Kenneth Green is stable on vit D. He is not yet at goal. He denies nausea, vomiting or muscle weakness.  Depression with emotional eating behaviors Kenneth Green is seeing Dr. Mallie Mussel for therapy, but he is still struggling with stress and comfort eating. [name] struggles with emotional eating and using food for comfort to the extent that it is negatively impacting his health. He often snacks when he is not hungry. Kenneth Green sometimes feels he is out of control and then feels guilty that he made poor food choices. Kenneth Green was on Wellbutrin XL for years, but he doesn't think he  has taken the SR version, which is more helpful for cravings. He has been working on behavior modification techniques to help reduce his emotional eating and has been somewhat successful. He shows no sign of suicidal or homicidal ideations.  ASSESSMENT AND PLAN:  Prediabetes  Vitamin D deficiency - Plan: Vitamin D, Ergocalciferol, (DRISDOL) 1.25 MG (50000 UT) CAPS capsule  Other depression - Plan: buPROPion (WELLBUTRIN SR) 200 MG 12 hr tablet  At risk for diabetes mellitus  Class 3 severe obesity with serious comorbidity and body mass index (BMI) of 40.0 to 44.9 in adult, unspecified obesity type [facility])  PLAN:  Pre-Diabetes Kenneth Green will continue to work on weight loss, exercise, and decreasing simple carbohydrates in his diet to help decrease the risk of diabetes. He was informed that eating too many simple carbohydrates or too many calories at one sitting increases the likelihood of GI side effects. We will recheck labs in 3 months and Kenneth Green agreed to follow up with [place] as directed to monitor his progress.  Diabetes risk counseling Kenneth Green was given extended (15 minutes) diabetes prevention counseling today. He is 53 y.o. male and has risk factors for diabetes including obesity and prediabetes. We discussed intensive lifestyle modifications today with an emphasis on weight loss as well as increasing exercise and decreasing simple carbohydrates in his diet.  Vitamin D Deficiency Kenneth Green was informed that low vitamin D levels contributes to fatigue and are associated with obesity, breast, and colon cancer. He agrees to continue to take  prescription Vit D @50 ,000 IU every week #4 with no refills and will follow up for routine testing of vitamin D, at least 2-3 times per year. He was informed of the risk of over-replacement of vitamin D and agrees to not increase his dose unless he discusses this with [place] first. Kenneth Green agrees to follow up as directed.  Depression with Emotional Eating  Behaviors We discussed behavior modification techniques today to help Isaiha deal with his emotional eating and depression. He has agreed to take Wellbutrin SR 200 mg BID #60 with no refills and follow up as directed.  Obesity Kenneth Green is currently in the action stage of change. As such, his goal is to continue with weight loss efforts He has agreed to follow the Category 4 plan Kenneth Green has been instructed to work up to a goal of 150 minutes of combined cardio and strengthening exercise per week for weight loss and overall health benefits. We discussed the following Behavioral Modification Strategies today: planning for success, better snacking choices, increasing lean protein intake, decreasing simple carbohydrates, work on meal planning and easy cooking plans, emotional eating strategies and avoiding temptations  Kenneth Green has agreed to follow up with our clinic in 2 weeks. He was informed of the importance of frequent follow up visits to maximize his success with intensive lifestyle modifications for his multiple health conditions.  ALLERGIES: Allergies  Allergen Reactions   Iohexol Hives and Rash    Patient needs pre medication per Dr. Zigmund Daniel    MEDICATIONS: Current Outpatient Medications on File Prior to Visit  Medication Sig Dispense Refill   benazepril-hydrochlorthiazide (LOTENSIN HCT) 10-12.5 MG per tablet      Cholecalciferol (VITAMIN D PO) Take by mouth. Take 1000 iu daily     fexofenadine (ALLEGRA) 180 MG tablet Take 180 mg by mouth daily.     Flaxseed, Linseed, (FLAX PO) Take 1 capsule by mouth daily.     gabapentin (NEURONTIN) 300 MG capsule Take 300 mg by mouth 3 (three) times daily.     No current facility-administered medications on file prior to visit.     PAST MEDICAL HISTORY: Past Medical History:  Diagnosis Date   Anxiety    Back pain    Binge eating disorder    Dyspnea    Glaucoma    Hypertension    Lower extremity edema    Major depressive  disorder, recurrent, unspecified ([place])    Mixed hyperlipidemia    Obesity    OSA (obstructive sleep apnea)    Prediabetes    Slow transit constipation     PAST SURGICAL HISTORY: Past Surgical History:  Procedure Laterality Date   SPINAL FUSION      SOCIAL HISTORY: Social History   Tobacco Use   Smoking status: Former Smoker   Smokeless tobacco: Never Used  Substance Use Topics   Alcohol use: Yes    Comment: 2-3 beers weekly   Drug use: No    FAMILY HISTORY: Family History  Problem Relation Age of Onset   Heart disease Mother    Hypertension Mother    Kidney disease Mother    Obesity Mother    Stroke Father    Hypertension Father    Heart disease Father    Sleep apnea Father    Obesity Father    Heart disease Sister    Diabetes Sister     ROS: Review of Systems  Constitutional: Positive for weight loss.  Gastrointestinal: Negative for nausea and vomiting.  Genitourinary: Negative for frequency.  Musculoskeletal:       Negative for muscle weakness  Endo/Heme/Allergies: Negative for polydipsia.       Positive for polyphagia Negative for hypoglycemia  Psychiatric/Behavioral: Positive for depression. Negative for suicidal ideas.    PHYSICAL EXAM: Blood pressure (!) 142/85, pulse 71, temperature 98.6 F (37 C), temperature source Oral, height 5\' 9"  (1.753 m), weight 293 lb (132.9 kg), SpO2 98 %. Body mass index is 43.27 kg/m. Physical Exam Vitals signs reviewed.  Constitutional:      Appearance: Normal appearance. He is well-developed. He is obese.  Cardiovascular:     Rate and Rhythm: Normal rate.  Pulmonary:     Effort: Pulmonary effort is normal.  Musculoskeletal: Normal range of motion.  Skin:    General: Skin is warm and dry.  Neurological:     Mental Status: He is alert and oriented to person, place, and time.  Psychiatric:        Mood and Affect: Mood normal.        Behavior: Behavior normal.        Thought Content:  Thought content does not include homicidal or suicidal ideation.     RECENT LABS AND TESTS: BMET    Component Value Date/Time   NA 139 05/07/2019   K 4.8 05/07/2019   CL 104 04/10/2019 1016   CO2 21 04/10/2019 1016   GLUCOSE 99 04/10/2019 1016   GLUCOSE 106 (H) 06/15/2016 1700   BUN 22 (A) 05/07/2019   CREATININE 1.2 05/07/2019   CREATININE 1.15 04/10/2019 1016   CALCIUM 9.3 04/10/2019 1016   GFRNONAA 72 04/10/2019 1016   GFRAA 83 04/10/2019 1016   Lab Results  Component Value Date   HGBA1C 5.7 05/07/2019   HGBA1C 5.9 (H) 04/10/2019   Lab Results  Component Value Date   INSULIN 30.9 (H) 04/10/2019   CBC    Component Value Date/Time   WBC 6.0 04/10/2019 1016   WBC 7.7 06/15/2016 1700   RBC 4.77 04/10/2019 1016   RBC 4.87 06/15/2016 1700   HGB 13.4 04/10/2019 1016   HCT 39.2 04/10/2019 1016   PLT 235 06/15/2016 1700   MCV 82 04/10/2019 1016   MCH 28.1 04/10/2019 1016   MCH 29.0 06/15/2016 1700   MCHC 34.2 04/10/2019 1016   MCHC 34.6 06/15/2016 1700   RDW 13.2 04/10/2019 1016   LYMPHSABS 1.5 04/10/2019 1016   MONOABS 0.7 06/15/2016 1700   EOSABS 0.5 (H) 04/10/2019 1016   BASOSABS 0.1 04/10/2019 1016   Iron/TIBC/Ferritin/ %Sat No results found for: IRON, TIBC, FERRITIN, IRONPCTSAT Lipid Panel     Component Value Date/Time   CHOL 138 05/07/2019   CHOL 182 04/10/2019 1016   TRIG 91 05/07/2019   HDL 38 05/07/2019   HDL 43 04/10/2019 1016   LDLCALC 82 05/07/2019   LDLCALC 111 (H) 04/10/2019 1016   Hepatic Function Panel     Component Value Date/Time   PROT 6.8 04/10/2019 1016   ALBUMIN 4.3 04/10/2019 1016   AST 23 05/07/2019   ALT 27 05/07/2019   ALKPHOS 55 04/10/2019 1016   BILITOT 0.4 04/10/2019 1016      Component Value Date/Time   TSH 1.340 04/10/2019 1016     Ref. Range 04/10/2019 10:16  Vitamin D, 25-Hydroxy Latest Ref Range: 30.0 - 100.0 ng/mL 37.8    OBESITY BEHAVIORAL INTERVENTION VISIT  Today's visit was # 3   Starting weight:  308 lbs Starting date: 04/10/2019 Today's weight : 293 lbs Today's date: 05/08/2019 Total lbs lost  to date: 15    05/08/2019  Height 5\' 9"  (1.753 m)  Weight 293 lb (132.9 kg)  BMI (Calculated) 43.25  BLOOD PRESSURE - SYSTOLIC 034  BLOOD PRESSURE - DIASTOLIC 85   Body Fat % 37 %  Total Body Water (lbs) 127.6 lbs    ASK: We discussed the diagnosis of obesity with Janalyn Shy today and [name] agreed to give [place] permission to discuss obesity behavioral modification therapy today.  ASSESS: Garrie has the diagnosis of obesity and his BMI today is 43.25 Chas is in the action stage of change   ADVISE: Coulton was educated on the multiple health risks of obesity as well as the benefit of weight loss to improve his health. He was advised of the need for long term treatment and the importance of lifestyle modifications to improve his current health and to decrease his risk of future health problems.  AGREE: Multiple dietary modification options and treatment options were discussed and  [place] agreed to follow the recommendations documented in the above note.  ARRANGE: Delvon was educated on the importance of frequent visits to treat obesity as outlined per CMS and USPSTF guidelines and agreed to schedule his next follow up appointment today.  I, Doreene Nest, am acting as transcriptionist for Dennard Nip, MD  I have reviewed the above documentation for accuracy and completeness, and I agree with the above. -Dennard Nip, MD

## 2019-05-09 NOTE — Progress Notes (Signed)
Office: [phone]  /  Fax: [phone]    Date: May 09, 2019    Appointment Start Time: 10:01am  Duration: 29 minutes Provider: Glennie Isle, Psy.D. Type of Session: Individual Therapy  Location of Patient: Home Location of Provider: Provider's Home Type of Contact: Telepsychological Visit via [employer] Web[employer]   Session Content: Kenneth Green is a 53 y.o. male presenting via [place] for a follow-up appointment to address the previously established treatment goal of decreasing emotional eating. Today's appointment was a telepsychological visit, as this provider's clinic is seeing a limited number of patients for in-person visits due to COVID-19. Therapeutic services will resume to in-person appointments once deemed appropriate. Kenneth Green expressed understanding regarding the rationale for telepsychological services, and provided verbal consent for today's appointment. Prior to proceeding with today's appointment, Kenneth Green's physical location at the time of this appointment was obtained. Kenneth Green reported he was at home and provided the address. In the event of technical difficulties, Kenneth Green shared a phone number he could be reached at. Kenneth Green and this provider participated in today's telepsychological service. Also, Kenneth Green denied anyone else being present in the room or on the WebEx appointment.  This provider conducted a brief check-in and verbally administered the PHQ-9 and GAD-7. Kenneth Green reported, "The last two weeks have been going really well and I lost another 7 pounds." He discussed "sticking to the meal plan," but acknowledged deviations from the meal plan on one day while he was out of town. Keldric reported an event for war re-enactment this coming weekend. Notably, he explained his current symptoms of anxiety are secondary to his wife's well-being. Additionally, Kenneth Green discussed not being able to give blood yesterday as his temperature was slightly elevated when it was re-checked  contributing to him feeling bad about himself. Associated thoughts and feelings were processed. Kenneth Green acknowledged he was triggered to eat emotionally; however, he did not engage in emotional eating. He also felt "guilty" for getting mad for being "deferred" from the blood drive. Thus, psychoeducation regarding anger as a secondary emotion.   Psychoeducation regarding emotional hunger versus emotional eating was provided. Moreover, psychoeducation regarding triggers for emotional eating was provided. Kenneth Green was provided a handout via e-mail, and encouraged to utilize the handout between now and the next appointment to increase awareness of triggers and frequency. Kenneth Green agreed. This provider also discussed behavioral strategies for specific triggers, such as placing the utensil down when conversing to avoid mindless eating. Kenneth Green provided verbal consent during today's appointment for this provider to send the handout via e-mail. Kenneth Green was receptive to today's session as evidenced by openness to sharing, responsiveness to feedback, and willingness to identify triggers for emotional eating.  Mental Status Examination:  Appearance: neat Behavior: cooperative Mood: euthymic Affect: mood congruent Speech: normal in rate, volume, and tone Eye Contact: appropriate Psychomotor Activity: appropriate Thought Process: linear, logical, and goal directed  Content/Perceptual Disturbances: denies suicidal and homicidal ideation, plan, and intent and no hallucinations, delusions, bizarre thinking or behavior reported or observed Orientation: time, person, place and purpose of appointment Cognition/Sensorium: memory, attention, language, and fund of knowledge intact  Insight: good Judgment: good  Structured Assessment Results: The Patient Health Questionnaire-9 (PHQ-9) is a self-report measure that assesses symptoms and severity of depression over the course of the last two weeks. Kenneth Green obtained a score  of 2 suggesting minimal depression. Kenneth Green finds the endorsed symptoms to be somewhat difficult. Kenneth Green interest or pleasure in doing things 0  Feeling down, depressed, or hopeless 1  Trouble falling or staying  asleep, or sleeping too much 0  Feeling tired or having Kenneth Green energy 0  Poor appetite or overeating 0  Feeling bad about yourself --- or that you are a failure or have let yourself or your family down 1  Trouble concentrating on things, such as reading the newspaper or watching television 0  Moving or speaking so slowly that other people could have noticed? Or the opposite --- being so fidgety or restless that you have been moving around a lot more than usual 0  Thoughts that you would be better off dead or hurting yourself in some way 0  PHQ-9 Score 2    The Generalized Anxiety Disorder-7 (GAD-7) is a brief self-report measure that assesses symptoms of anxiety over the course of the last two weeks. Norm obtained a score of 12 suggesting moderate anxiety. Kenneth Green finds the endorsed symptoms to be somewhat difficult. Feeling nervous, anxious, on edge 2  Not being able to stop or control worrying 1  Worrying too much about different things 1  Trouble relaxing 3  Being so restless that it's hard to sit still 3  Becoming easily annoyed or irritable 2  Feeling afraid as if something awful might happen 0  GAD-7 Score 12   Interventions:  Conducted a brief chart review Verbal administration of PHQ-9 and GAD-7 for symptom monitoring Provided empathic reflections and validation Processed thoughts and feelings Psychoeducation provided regarding triggers for emotional eating Provided positive reinforcement Focused on rapport building Psychoeducation provided regarding anger Psychoeducation provided regarding emotional hunger versus emotional eating  DSM-5 Diagnosis: 296.31 (F33.0) Major Depressive Disorder, Recurrent Episode, Mild, With Anxious Distress  Treatment Goal & Progress:  During the initial appointment with this provider, the following treatment goal was established: decrease emotional eating. Progress is limited, as Kenneth Green has just begun treatment with this provider; however, he is receptive to the interaction and interventions and rapport is being established. Nevertheless,  Kenneth Green has demonstrated some progress in his goal as evidenced by increased awareness of hunger patterns and willingness to identify triggers for emotional eating.   Plan: Kenneth Green continues to appear able and willing to participate as evidenced by engagement in reciprocal conversation, and asking questions for clarification as appropriate. The next appointment will be scheduled in two weeks, which will be via [employer]. Once this provider's office resumes in-person appointments and it is deemed appropriate, Kenneth Green will be notified. The next session will focus on reviewing triggers for emotional eating, and the introduction of pleasurable activities.

## 2019-05-15 NOTE — Progress Notes (Signed)
Office: [phone]  /  Fax: [phone]    Date: May 21, 2019   Appointment Start Time: 11:00am Duration: 30 minutes Provider: Glennie Isle, Psy.D. Type of Session: Individual Therapy  Location of Patient: Home Location of Provider: Provider's Home Type of Contact: Telepsychological Visit via [employer] WebEx   Session Content: Kenneth Green is a 53 y.o. male presenting via [place] for a follow-up appointment to address the previously established treatment goal of decreasing emotional eating. Today's appointment was a telepsychological visit, as this provider's clinic is seeing a limited number of patients for in-person visits due to COVID-19. Therapeutic services will resume to in-person appointments once deemed appropriate. Kenneth Green expressed understanding regarding the rationale for telepsychological services, and provided verbal consent for today's appointment. Prior to proceeding with today's appointment, Kenneth Green's physical location at the time of this appointment was obtained. Braidan reported he was at home and provided the address. In the event of technical difficulties, Kenneth Green shared a phone number he could be reached at. Kenneth Green and this provider participated in today's telepsychological service. Also, Kenneth Green denied anyone else being present in the room or on the WebEx appointment.  This provider conducted a brief check-in and verbally administered the PHQ-9 and GAD-7. Even reported things are "going well" from an eating perspective. [name] stated the dosage for Wellbutrin was increased. He also noted he had a gathering for war re-enactment. [name] further discussed concerns related to anger. This was explored. He acknowledged getting angry, but denied eating in response to anger. [name] reported the anger is internalized and explained he "let[s] little things bother [him]." This provider discussed the option of a referral for longer-term therapeutic services as Kenneth Green previously  verbalized concerns related to the aforementioned. Kenneth Green provided verbal consent for this provider to place the referral and notate it would be for therapy to address emotional eating and anger concerns. Moreover, triggers for emotional eating were reviewed. Kenneth Green identified experiencing the following triggers: unpleasant emotions, boredom, and stress. He noted, "The more I pay attention to it, it's easier for me to stop it." Psychoeducation regarding pleasurable activities, including its impact on emotional eating and overall well-being was provided. Kenneth Green was provided with a handout with various options of pleasurable activities, and was encouraged to engage in one activity a day and additional activities as needed when triggered to emotionally eat. Kenneth Green agreed. Kenneth Green provided verbal consent during today's appointment for this provider to send the handout for pleasurable activities via e-mail. Tarry discussed feeling "guilty" if he does something for himself because he feels he needs to be "productive." Thus, the importance of self-care was provided and highlighted with the oxygen mask on an airplane metaphor. [name] was receptive to today's session as evidenced by openness to sharing, responsiveness to feedback, and willingness to engage in pleasurable activities. Session concluded with Kenneth Green sharing pictures form his war re-enactment get together and discussed virtual get togethers with members.   Mental Status Examination:  Appearance: neat Behavior: cooperative Mood: euthymic Affect: mood congruent Speech: normal in rate, volume, and tone Eye Contact: appropriate Psychomotor Activity: appropriate Thought Process: linear, logical, and goal directed  Content/Perceptual Disturbances: denies suicidal and homicidal ideation, plan, and intent and no hallucinations, delusions, bizarre thinking or behavior reported or observed Orientation: time, person, place and purpose of  appointment Cognition/Sensorium: memory, attention, language, and fund of knowledge intact  Insight: good Judgment: good  Structured Assessment Results: The Patient Health Questionnaire-9 (PHQ-9) is a self-report measure that assesses symptoms and severity of depression over the course  of the last two weeks. Kenneth Green obtained a score of 2 suggesting minimal depression. Kenneth Green finds the endorsed symptoms to be not difficult at all. Little interest or pleasure in doing things 0  Feeling down, depressed, or hopeless 1  Trouble falling or staying asleep, or sleeping too much 0  Feeling tired or having little energy 0  Poor appetite or overeating 0  Feeling bad about yourself --- or that you are a failure or have let yourself or your family down 1  Trouble concentrating on things, such as reading the newspaper or watching television 0  Moving or speaking so slowly that other people could have noticed? Or the opposite --- being so fidgety or restless that you have been moving around a lot more than usual 0  Thoughts that you would be better off dead or hurting yourself in some way 0  PHQ-9 Score 2    The Generalized Anxiety Disorder-7 (GAD-7) is a brief self-report measure that assesses symptoms of anxiety over the course of the last two weeks. Kenneth Green obtained a score of 7 suggesting mild anxiety. Kenneth Green finds the endorsed symptoms to be not difficult at all. Feeling nervous, anxious, on edge 0  Not being able to stop or control worrying 1  Worrying too much about different things 1  Trouble relaxing 2  Being so restless that it's hard to sit still 2  Becoming easily annoyed or irritable 1  Feeling afraid as if something awful might happen 0  GAD-7 Score 7   Interventions:  Conducted a brief chart review Verbal administration of PHQ-9 and GAD-7 for symptom monitoring Provided empathic reflections and validation Reviewed content from the previous session Psychoeducation provided regarding  pleasurable activities Discussed option for a referral for longer-term therapeutic services Provided positive reinforcement Employed supportive psychotherapy interventions to facilitate reduced distress, and to improve coping skills with identified stressors Psychoeducation provided regarding importance of self-care  DSM-5 Diagnosis: 296.31 (F33.0) Major Depressive Disorder, Recurrent Episode, Mild, With Anxious Distress  Treatment Goal & Progress: During the initial appointment with this provider, the following treatment goal was established: decrease emotional eating. Lucifer has demonstrated progress in his goal as evidenced by increased awareness of hunger patterns and triggers for emotional eating. He continues to demonstrate willingness to engage in learned skills.   Plan: Juliano continues to appear able and willing to participate as evidenced by engagement in reciprocal conversation, and asking questions for clarification as appropriate. The next appointment will be scheduled in two weeks, which will be via [employer]. Once this provider's office resumes in-person appointments and it is deemed appropriate, Barnard will be notified. The next session will focus on reviewing pleasurable activities, and the introduction of thought defusion.

## 2019-05-21 ENCOUNTER — Other Ambulatory Visit: Payer: Self-pay

## 2019-05-21 ENCOUNTER — Ambulatory Visit (INDEPENDENT_AMBULATORY_CARE_PROVIDER_SITE_OTHER): Payer: 59 | Admitting: Psychology

## 2019-05-21 DIAGNOSIS — F330 Major depressive disorder, recurrent, mild: Secondary | ICD-10-CM | POA: Diagnosis not present

## 2019-05-22 ENCOUNTER — Other Ambulatory Visit: Payer: Self-pay

## 2019-05-22 ENCOUNTER — Ambulatory Visit (INDEPENDENT_AMBULATORY_CARE_PROVIDER_SITE_OTHER): Payer: 59 | Admitting: Family Medicine

## 2019-05-22 ENCOUNTER — Encounter (INDEPENDENT_AMBULATORY_CARE_PROVIDER_SITE_OTHER): Payer: Self-pay | Admitting: Family Medicine

## 2019-05-22 VITALS — BP 133/79 | HR 66 | Temp 97.7°F | Ht 69.0 in | Wt 286.0 lb

## 2019-05-22 DIAGNOSIS — E559 Vitamin D deficiency, unspecified: Secondary | ICD-10-CM

## 2019-05-22 DIAGNOSIS — Z6841 Body Mass Index (BMI) 40.0 and over, adult: Secondary | ICD-10-CM

## 2019-05-24 NOTE — Progress Notes (Signed)
Office: [phone]  /  Fax: [phone]   HPI:   Chief Complaint: OBESITY Kenneth Green is here to discuss his progress with his obesity treatment plan. He is on the Category 4 plan and is following his eating plan approximately 95% of the time. He states he is walking 40 minutes 4 times per week. Kenneth Green continues to do well with weight loss on his Category 4 plan. He states his hunger is controlled and he likes his plan but misses sweet things and worries if he has a little he will go completely off track. His weight is 286 lb (129.7 kg) today and has had a weight loss of 7 pounds over a period of 2 weeks since his last visit. He has lost 22 lbs since starting treatment with [place].   Vitamin D deficiency Kenneth Green has a diagnosis of Vitamin D deficiency, which is not yet at goal. His last Vitamin D level was reported to be 37.8 on 04/10/2019. He is currently stable on prescription Vit D and denies nausea, vomiting or muscle weakness.  ASSESSMENT AND PLAN:  Vitamin D deficiency  Class 3 severe obesity with serious comorbidity and body mass index (BMI) of 40.0 to 44.9 in adult, unspecified obesity type (Kenneth Green)  PLAN:  Vitamin D Deficiency Kenneth Green was informed that low Vitamin D levels contributes to fatigue and are associated with obesity, breast, and colon cancer. Kenneth Green to continue to take prescription Vit D @ 50,000 IU every week and will follow-up for routine testing of Vitamin D, at least 2-3 times per year. He was informed of the risk of over-replacement of Vitamin D and agrees to not increase his dose unless he discusses this with [place] first. Kenneth Green agrees to follow-up with our clinic in 2-3 weeks.    Obesity Kenneth Green is currently in the action stage of change. As such, his goal is to continue with weight loss efforts. He has agreed to follow the Category 4 plan. Kenneth Green has been instructed to work up to a goal of 150 minutes of combined cardio and strengthening exercise per week for  weight loss and overall health benefits. We discussed the following Behavioral Modification Strategies today: increasing vegetables, work on meal planning, easy cooking plans, better snacking choices, and emotional eating strategies.  I spent > than 50% of the 15 minute visit on counseling as documented in the note.  Kenneth Green has agreed to follow-up with our clinic in 2-3 weeks. He was informed of the importance of frequent follow-up visits to maximize his success with intensive lifestyle modifications for his multiple health conditions.  ALLERGIES: Allergies  Allergen Reactions  . Iohexol Hives and Rash    Patient needs pre medication per Dr. Zigmund Daniel    MEDICATIONS: Current Outpatient Medications on File Prior to Visit  Medication Sig Dispense Refill  . benazepril-hydrochlorthiazide (LOTENSIN HCT) 10-12.5 MG per tablet     . buPROPion (WELLBUTRIN SR) 200 MG 12 hr tablet Take 1 tablet (200 mg total) by mouth 2 (two) times daily. 60 tablet 0  . Cholecalciferol (VITAMIN D PO) Take by mouth. Take 1000 iu daily    . fexofenadine (ALLEGRA) 180 MG tablet Take 180 mg by mouth daily.    . Flaxseed, Linseed, (FLAX PO) Take 1 capsule by mouth daily.    Marland Kitchen gabapentin (NEURONTIN) 300 MG capsule Take 300 mg by mouth 3 (three) times daily.    . Vitamin D, Ergocalciferol, (DRISDOL) 1.25 MG (50000 UT) CAPS capsule Take 1 capsule (50,000 Units total) by mouth every 7 (  seven) days. 4 capsule 0   No current facility-administered medications on file prior to visit.     PAST MEDICAL HISTORY: Past Medical History:  Diagnosis Date  . Anxiety   . Back pain   . Binge eating disorder   . Dyspnea   . Glaucoma   . Hypertension   . Lower extremity edema   . Major depressive disorder, recurrent, unspecified ([place])   . Mixed hyperlipidemia   . Obesity   . OSA (obstructive sleep apnea)   . Prediabetes   . Slow transit constipation     PAST SURGICAL HISTORY: Past Surgical History:  Procedure Laterality  Date  . SPINAL FUSION      SOCIAL HISTORY: Social History   Tobacco Use  . Smoking status: Former Research scientist (life sciences)  . Smokeless tobacco: Never Used  Substance Use Topics  . Alcohol use: Yes    Comment: 2-3 beers weekly  . Drug use: No    FAMILY HISTORY: Family History  Problem Relation Age of Onset  . Heart disease Mother   . Hypertension Mother   . Kidney disease Mother   . Obesity Mother   . Stroke Father   . Hypertension Father   . Heart disease Father   . Sleep apnea Father   . Obesity Father   . Heart disease Sister   . Diabetes Sister    ROS: Review of Systems  Gastrointestinal: Negative for nausea and vomiting.  Musculoskeletal:       Negative for muscle weakness.   PHYSICAL EXAM: Blood pressure 133/79, pulse 66, temperature 97.7 F (36.5 C), temperature source Oral, height 5\' 9"  (1.753 m), weight 286 lb (129.7 kg), SpO2 97 %. Body mass index is 42.23 kg/m. Physical Exam Vitals signs reviewed.  Constitutional:      Appearance: Normal appearance. He is obese.  Cardiovascular:     Rate and Rhythm: Normal rate.     Pulses: Normal pulses.  Pulmonary:     Effort: Pulmonary effort is normal.     Breath sounds: Normal breath sounds.  Musculoskeletal: Normal range of motion.  Skin:    General: Skin is warm and dry.  Neurological:     Mental Status: He is alert and oriented to person, place, and time.  Psychiatric:        Behavior: Behavior normal.   RECENT LABS AND TESTS: BMET    Component Value Date/Time   NA 139 05/07/2019   K 4.8 05/07/2019   CL 104 04/10/2019 1016   CO2 21 04/10/2019 1016   GLUCOSE 99 04/10/2019 1016   GLUCOSE 106 (H) 06/15/2016 1700   BUN 22 (A) 05/07/2019   CREATININE 1.2 05/07/2019   CREATININE 1.15 04/10/2019 1016   CALCIUM 9.3 04/10/2019 1016   GFRNONAA 72 04/10/2019 1016   GFRAA 83 04/10/2019 1016   Lab Results  Component Value Date   HGBA1C 5.7 05/07/2019   HGBA1C 5.9 (H) 04/10/2019   Lab Results  Component Value Date    INSULIN 30.9 (H) 04/10/2019   CBC    Component Value Date/Time   WBC 6.0 04/10/2019 1016   WBC 7.7 06/15/2016 1700   RBC 4.77 04/10/2019 1016   RBC 4.87 06/15/2016 1700   HGB 13.4 04/10/2019 1016   HCT 39.2 04/10/2019 1016   PLT 235 06/15/2016 1700   MCV 82 04/10/2019 1016   MCH 28.1 04/10/2019 1016   MCH 29.0 06/15/2016 1700   MCHC 34.2 04/10/2019 1016   MCHC 34.6 06/15/2016 1700   RDW 13.2  04/10/2019 1016   LYMPHSABS 1.5 04/10/2019 1016   MONOABS 0.7 06/15/2016 1700   EOSABS 0.5 (H) 04/10/2019 1016   BASOSABS 0.1 04/10/2019 1016   Iron/TIBC/Ferritin/ %Sat No results found for: IRON, TIBC, FERRITIN, IRONPCTSAT Lipid Panel     Component Value Date/Time   CHOL 138 05/07/2019   CHOL 182 04/10/2019 1016   TRIG 91 05/07/2019   HDL 38 05/07/2019   HDL 43 04/10/2019 1016   LDLCALC 82 05/07/2019   LDLCALC 111 (H) 04/10/2019 1016   Hepatic Function Panel     Component Value Date/Time   PROT 6.8 04/10/2019 1016   ALBUMIN 4.3 04/10/2019 1016   AST 23 05/07/2019   ALT 27 05/07/2019   ALKPHOS 55 04/10/2019 1016   BILITOT 0.4 04/10/2019 1016      Component Value Date/Time   TSH 1.340 04/10/2019 1016   Results for DANILO, CAPPIELLO (MRN [id number]) as of 05/24/2019 08:40  Ref. Range 04/10/2019 10:16  Vitamin D, 25-Hydroxy Latest Ref Range: 30.0 - 100.0 ng/mL 37.8   OBESITY BEHAVIORAL INTERVENTION VISIT  Today's visit was #4  Starting weight: 308 lbs Starting date: 04/10/2019 Today's weight: 286 lbs  Today's date: 05/22/2019 Total lbs lost to date: 22   05/22/2019  Height 5\' 9"  (1.753 m)  Weight 286 lb (129.7 kg)  BMI (Calculated) 42.22  BLOOD PRESSURE - SYSTOLIC 294  BLOOD PRESSURE - DIASTOLIC 79   Body Fat % 76.5 %  Total Body Water (lbs) 126.6 lbs   ASK: We discussed the diagnosis of obesity with Janalyn Shy today and [name] agreed to give [place] permission to discuss obesity behavioral modification therapy today.  ASSESS: Darrol has the diagnosis of  obesity and his BMI today is 42.3. Jhordan is in the action stage of change.   ADVISE: Luz was educated on the multiple health risks of obesity as well as the benefit of weight loss to improve his health. He was advised of the need for long term treatment and the importance of lifestyle modifications to improve his current health and to decrease his risk of future health problems.  AGREE: Multiple dietary modification options and treatment options were discussed and  Jaye agreed to follow the recommendations documented in the above note.  ARRANGE: Kenneth Green was educated on the importance of frequent visits to treat obesity as outlined per CMS and USPSTF guidelines and agreed to schedule his next follow up appointment today.  I, Michaelene Song, am acting as Location manager for Dennard Nip, MD  I have reviewed the above documentation for accuracy and completeness, and I agree with the above. -Dennard Nip, MD

## 2019-05-29 ENCOUNTER — Other Ambulatory Visit (INDEPENDENT_AMBULATORY_CARE_PROVIDER_SITE_OTHER): Payer: Self-pay | Admitting: Family Medicine

## 2019-05-29 DIAGNOSIS — F3289 Other specified depressive episodes: Secondary | ICD-10-CM

## 2019-06-04 ENCOUNTER — Ambulatory Visit (INDEPENDENT_AMBULATORY_CARE_PROVIDER_SITE_OTHER): Payer: 59 | Admitting: Psychology

## 2019-06-04 DIAGNOSIS — F4321 Adjustment disorder with depressed mood: Secondary | ICD-10-CM | POA: Diagnosis not present

## 2019-06-06 ENCOUNTER — Ambulatory Visit (INDEPENDENT_AMBULATORY_CARE_PROVIDER_SITE_OTHER): Payer: 59 | Admitting: Psychology

## 2019-06-06 ENCOUNTER — Other Ambulatory Visit: Payer: Self-pay

## 2019-06-06 DIAGNOSIS — F330 Major depressive disorder, recurrent, mild: Secondary | ICD-10-CM

## 2019-06-06 NOTE — Progress Notes (Signed)
Office: [phone]  /  Fax: [phone]    Date: June 06, 2019    Appointment Start Time: 10:33am Duration: 29 minutes Provider: Glennie Isle, Psy.D. Type of Session: Individual Therapy  Location of Patient: Home Location of Provider: Provider's Home Type of Contact: Telepsychological Visit via [employer] Web[employer]   Session Content: Kenneth Green is a 53 y.o. male presenting via [place] for a follow-up appointment to address the previously established treatment goal of decreasing emotional eating. Today's appointment was a telepsychological visit, as this provider's clinic is seeing a limited number of patients for in-person visits due to COVID-19. Therapeutic services will resume to in-person appointments once deemed appropriate. Kenneth Green expressed understanding regarding the rationale for telepsychological services, and provided verbal consent for today's appointment. Prior to proceeding with today's appointment, Kenneth Green's physical location at the time of this appointment was obtained. Kenneth Green reported he was at home and provided the address. In the event of technical difficulties, Kenneth Green shared a phone number he could be reached at. Kenneth Green and this provider participated in today's telepsychological service. Also, Kenneth Green denied anyone else being present in the room or on the WebEx appointment.  This provider conducted a brief check-in and verbally administered the PHQ-9 and GAD-7. Kenneth Green discussed having "several temptations" to pick up food when on the road due to work. This was explored further. He indicated experiencing "different stressors" lately at work and home and he explained he desires fast food to help "relieve stress" even when he is not hungry. At home, Kenneth Green shared conflict with his wife as it relates to chores at home. Kenneth Green acknowledged the aforementioned has occurred in the past. Thus, this provider explored what has helped them in the past. Kenneth Green reflected "she forgets about  it;" therefore, the conflict goes away. He also discussed stepping away from the conflict helps de-escalate. This provider further recommended addressing conflicts with his wife when he is not experiencing fatigue after work. Moreover, this provider and Kenneth Green discussed utilization of engaging in pleasurable activities when experiencing stress secondary to conflict. He noted, "Food is my go to thing." However, he expressed willingness to engage in pleasurable activities after getting off to work by planning the activity prior to going to work to help increase coping. Kenneth Green was receptive to today's session as evidenced by openness to sharing, responsiveness to feedback, and willingness to continue engaging in pleasurable activities to help break patterns.  Mental Status Examination:  Appearance: neat Behavior: cooperative Mood: euthymic Affect: mood congruent Speech: normal in rate, volume, and tone Eye Contact: appropriate Psychomotor Activity: appropriate Thought Process: linear, logical, and goal directed  Content/Perceptual Disturbances: denies suicidal and homicidal ideation, plan, and intent and no hallucinations, delusions, bizarre thinking or behavior reported or observed Orientation: time, person, place and purpose of appointment Cognition/Sensorium: memory, attention, language, and fund of knowledge intact  Insight: good Judgment: good  Structured Assessment Results: The Patient Health Questionnaire-9 (PHQ-9) is a self-report measure that assesses symptoms and severity of depression over the course of the last two weeks. Kenneth Green obtained a score of 1 suggesting minimal depression. Kenneth Green finds the endorsed symptoms to be not difficult at all. Little interest or pleasure in doing things 0  Feeling down, depressed, or hopeless 0  Trouble falling or staying asleep, or sleeping too much 0  Feeling tired or having little energy 0  Poor appetite or overeating 0  Feeling bad about  yourself --- or that you are a failure or have let yourself or your family down 1  Trouble concentrating on things, such as reading the newspaper or watching television 0  Moving or speaking so slowly that other people could have noticed? Or the opposite --- being so fidgety or restless that you have been moving around a lot more than usual 0  Thoughts that you would be better off dead or hurting yourself in some way 0  PHQ-9 Score 1    The Generalized Anxiety Disorder-7 (GAD-7) is a brief self-report measure that assesses symptoms of anxiety over the course of the last two weeks. Kenneth Green obtained a score of 8 suggesting mild anxiety. Kenneth Green finds the endorsed symptoms to be not difficult at all. Feeling nervous, anxious, on edge 2  Not being able to stop or control worrying 0  Worrying too much about different things 2  Trouble relaxing 1  Being so restless that it's hard to sit still 1  Becoming easily annoyed or irritable 2  Feeling afraid as if something awful might happen 0  GAD-7 Score 8   Interventions:  Conducted a brief chart review Verbal administration of PHQ-9 and GAD-7 for symptom monitoring Provided empathic reflections and validation Reviewed content from the previous session Engaged patient in problem solving Processed thoughts and feelings Employed supportive psychotherapy interventions to facilitate reduced distress, and to improve coping skills with identified stressors  DSM-5 Diagnosis: 296.31 (F33.0) Major Depressive Disorder, Recurrent Episode, Mild, With Anxious Distress  Treatment Goal & Progress: During the initial appointment with this provider, the following treatment goal was established: decrease emotional eating. [name] has demonstrated progress in his goal as evidenced by increased awareness of hunger patterns and triggers for emotional eating. He continues to demonstrate willingness to engage in learned skills to assist with coping.   Plan: Kenneth Green  continues to appear able and willing to participate as evidenced by engagement in reciprocal conversation, and asking questions for clarification as appropriate. The next appointment will be scheduled in two weeks, which will be via [employer]. Once this provider's office resumes in-person appointments and it is deemed appropriate, Kenneth Green will be notified. The next session will focus on the introduction of thought defusion, as it was not introduced today due to Dontai's presenting concerns.

## 2019-06-12 ENCOUNTER — Encounter (INDEPENDENT_AMBULATORY_CARE_PROVIDER_SITE_OTHER): Payer: Self-pay | Admitting: Family Medicine

## 2019-06-12 ENCOUNTER — Ambulatory Visit (INDEPENDENT_AMBULATORY_CARE_PROVIDER_SITE_OTHER): Payer: 59 | Admitting: Family Medicine

## 2019-06-12 ENCOUNTER — Other Ambulatory Visit: Payer: Self-pay

## 2019-06-12 VITALS — BP 145/82 | HR 65 | Temp 97.9°F | Ht 69.0 in | Wt 277.0 lb

## 2019-06-12 DIAGNOSIS — Z9189 Other specified personal risk factors, not elsewhere classified: Secondary | ICD-10-CM

## 2019-06-12 DIAGNOSIS — F3289 Other specified depressive episodes: Secondary | ICD-10-CM | POA: Diagnosis not present

## 2019-06-12 DIAGNOSIS — E559 Vitamin D deficiency, unspecified: Secondary | ICD-10-CM | POA: Diagnosis not present

## 2019-06-12 DIAGNOSIS — Z6841 Body Mass Index (BMI) 40.0 and over, adult: Secondary | ICD-10-CM

## 2019-06-12 DIAGNOSIS — I10 Essential (primary) hypertension: Secondary | ICD-10-CM

## 2019-06-12 MED ORDER — BUPROPION HCL ER (SR) 200 MG PO TB12: 200.0000 mg | ORAL_TABLET | Freq: Two times a day (BID) | ORAL | 0 refills | Status: DC

## 2019-06-12 MED ORDER — VITAMIN D (ERGOCALCIFEROL) 1.25 MG (50000 UNIT) PO CAPS: 50000.0000 [IU] | ORAL_CAPSULE | ORAL | 0 refills | Status: DC

## 2019-06-12 NOTE — Progress Notes (Signed)
Office: [phone]  /  Fax: [phone]   HPI:   Chief Complaint: OBESITY Kenneth Green is here to discuss his progress with his obesity treatment plan. He is on the Category 4 plan and is following his eating plan approximately 90 % of the time. He states he is walking 45 minutes 5 times per week. Aarin continues to do well with weight loss on the Category 4 plan. He has increased walking with his daughter in the morning. His hunger is controlled and he is happy with his plan and is not getting bored.  His weight is 277 lb (125.6 kg) today and has had a weight loss of 9 pounds over a period of 3 weeks since his last visit. He has lost 31 lbs since starting treatment with [place].  Hypertension Kenneth Green is a 53 y.o. male with hypertension. [name]'s blood pressure is elevated today. His blood pressure had been decreasing with weight loss for the last 2 months. This could be due to Wellbutrin, but he also notes increased stress today. Maxi is taking his medications well. He is working on weight loss to help control his blood pressure with the goal of decreasing his risk of heart attack and stroke. Khalif denies chest pain or headache.  At risk for cardiovascular disease Kenneth Green is at a higher than average risk for cardiovascular disease due to hypertension and obesity.   Vitamin D Deficiency Kenneth Green has a diagnosis of vitamin D deficiency. He is currently stable on vit D. Treg denies nausea, vomiting, or muscle weakness.  Depression with emotional eating behaviors Kenneth Green's mood is stable on Wellbutrin and he is doing well avoiding emotional eating and using food for comfort to the extent that it is negatively impacting his health. He has been working on behavior modification techniques to help reduce his emotional eating and has been somewhat successful. He denies insomnia.  Depression screen PHQ 2/9 04/10/2019  Decreased Interest 3  Down, Depressed, Hopeless 2  PHQ - 2 Score 5    Altered sleeping 1  Tired, decreased energy 3  Change in appetite 3  Feeling bad or failure about yourself  2  Trouble concentrating 1  Moving slowly or fidgety/restless 3  Suicidal thoughts 0  PHQ-9 Score 18  Difficult doing work/chores Somewhat difficult   ASSESSMENT AND PLAN:  Essential hypertension  Vitamin D deficiency - Plan: Vitamin D, Ergocalciferol, (DRISDOL) 1.25 MG (50000 UT) CAPS capsule  Other depression - Plan: buPROPion (WELLBUTRIN SR) 200 MG 12 hr tablet  At risk for heart disease  Class 3 severe obesity with serious comorbidity and body mass index (BMI) of 40.0 to 44.9 in adult, unspecified obesity type (HCC)  PLAN:  Hypertension We discussed sodium restriction, working on healthy weight loss, and a regular exercise program as the means to achieve improved blood pressure control. We will continue to monitor his blood pressure as well as his progress with the above lifestyle modifications. He will continue his medications as prescribed, as well as diet and exercise. He will watch for signs of hypotension as he continues his lifestyle modifications. [name] agreed with this plan and we will recheck him in 2 weeks.  Cardiovascular risk counseling [name] was given extended (15 minutes) coronary artery disease prevention counseling today. He is 53 y.o. male and has risk factors for heart disease including hypertension and obesity. We discussed intensive lifestyle modifications today with an emphasis on specific weight loss instructions and strategies. Pt was also informed of the importance of increasing exercise  and decreasing saturated fats to help prevent heart disease.  Vitamin D Deficiency Kenneth Green was informed that low vitamin D levels contribute to fatigue and are associated with obesity, breast, and colon cancer. Kenneth Green agrees to continue to take prescription Vit D @50 ,000 IU every week #4 with no refills and will follow up for routine testing of vitamin D, at  least 2-3 times per year. He was informed of the risk of over-replacement of vitamin D and agrees to not increase his dose unless he discusses this with [place] first. Kenneth Green agrees to follow up in 2 weeks as directed.  Depression with Emotional Eating Behaviors We discussed behavior modification techniques today to help [name] deal with his emotional eating and depression. He has agreed to take Wellbutrin SR 200 mg BID # 60 with no refills and agreed to follow up as directed.  Obesity Kenneth Green is currently in the action stage of change. As such, his goal is to continue with weight loss efforts. He has agreed to follow the Category 4 plan. [name] has been instructed to work up to a goal of 150 minutes of combined cardio and strengthening exercise per week for weight loss and overall health benefits. We discussed the following Behavioral Modification Strategies today: increasing lean protein intake, increase H20 intake, decreasing simple carbohydrates, decreasing sodium intake, and work on meal planning and easy cooking plans.  Kenneth Green has agreed to follow up with our clinic in 2 weeks. He was informed of the importance of frequent follow up visits to maximize his success with intensive lifestyle modifications for his multiple health conditions.  ALLERGIES: Allergies  Allergen Reactions   Iohexol Hives and Rash    Patient needs pre medication per Dr. Zigmund Daniel    MEDICATIONS: Current Outpatient Medications on File Prior to Visit  Medication Sig Dispense Refill   benazepril-hydrochlorthiazide (LOTENSIN HCT) 10-12.5 MG per tablet      buPROPion (WELLBUTRIN SR) 200 MG 12 hr tablet TAKE 1 TABLET BY MOUTH TWICE A DAY 60 tablet 0   Cholecalciferol (VITAMIN D PO) Take by mouth. Take 1000 iu daily     fexofenadine (ALLEGRA) 180 MG tablet Take 180 mg by mouth daily.     Flaxseed, Linseed, (FLAX PO) Take 1 capsule by mouth daily.     gabapentin (NEURONTIN) 300 MG capsule Take 300 mg by mouth 3  (three) times daily.     Vitamin D, Ergocalciferol, (DRISDOL) 1.25 MG (50000 UT) CAPS capsule Take 1 capsule (50,000 Units total) by mouth every 7 (seven) days. 4 capsule 0   No current facility-administered medications on file prior to visit.     PAST MEDICAL HISTORY: Past Medical History:  Diagnosis Date   Anxiety    Back pain    Binge eating disorder    Dyspnea    Glaucoma    Hypertension    Lower extremity edema    Major depressive disorder, recurrent, unspecified (HCC)    Mixed hyperlipidemia    Obesity    OSA (obstructive sleep apnea)    Prediabetes    Slow transit constipation     PAST SURGICAL HISTORY: Past Surgical History:  Procedure Laterality Date   SPINAL FUSION      SOCIAL HISTORY: Social History   Tobacco Use   Smoking status: Former Smoker   Smokeless tobacco: Never Used  Substance Use Topics   Alcohol use: Yes    Comment: 2-3 beers weekly   Drug use: No    FAMILY HISTORY: Family History  Problem Relation Age  of Onset   Heart disease Mother    Hypertension Mother    Kidney disease Mother    Obesity Mother    Stroke Father    Hypertension Father    Heart disease Father    Sleep apnea Father    Obesity Father    Heart disease Sister    Diabetes Sister     ROS: Review of Systems  Cardiovascular: Negative for chest pain.  Gastrointestinal: Negative for nausea and vomiting.  Musculoskeletal:       Negative for muscle weakness.  Neurological: Negative for headaches.  Psychiatric/Behavioral: Positive for depression. The patient does not have insomnia.     PHYSICAL EXAM: Blood pressure (!) 145/82, pulse 65, temperature 97.9 F (36.6 C), temperature source Oral, height 5\' 9"  (1.753 m), weight 277 lb (125.6 kg), SpO2 98 %. Body mass index is 40.91 kg/m. Physical Exam Vitals signs reviewed.  Constitutional:      Appearance: Normal appearance. He is obese.  Cardiovascular:     Rate and Rhythm: Normal rate.   Pulmonary:     Effort: Pulmonary effort is normal.  Musculoskeletal: Normal range of motion.  Skin:    General: Skin is warm and dry.  Neurological:     Mental Status: He is alert and oriented to person, place, and time.  Psychiatric:        Mood and Affect: Mood normal.        Behavior: Behavior normal.     RECENT LABS AND TESTS: BMET    Component Value Date/Time   NA 139 05/07/2019   K 4.8 05/07/2019   CL 104 04/10/2019 1016   CO2 21 04/10/2019 1016   GLUCOSE 99 04/10/2019 1016   GLUCOSE 106 (H) 06/15/2016 1700   BUN 22 (A) 05/07/2019   CREATININE 1.2 05/07/2019   CREATININE 1.15 04/10/2019 1016   CALCIUM 9.3 04/10/2019 1016   GFRNONAA 72 04/10/2019 1016   GFRAA 83 04/10/2019 1016   Lab Results  Component Value Date   HGBA1C 5.7 05/07/2019   HGBA1C 5.9 (H) 04/10/2019   Lab Results  Component Value Date   INSULIN 30.9 (H) 04/10/2019   CBC    Component Value Date/Time   WBC 6.0 04/10/2019 1016   WBC 7.7 06/15/2016 1700   RBC 4.77 04/10/2019 1016   RBC 4.87 06/15/2016 1700   HGB 13.4 04/10/2019 1016   HCT 39.2 04/10/2019 1016   PLT 235 06/15/2016 1700   MCV 82 04/10/2019 1016   MCH 28.1 04/10/2019 1016   MCH 29.0 06/15/2016 1700   MCHC 34.2 04/10/2019 1016   MCHC 34.6 06/15/2016 1700   RDW 13.2 04/10/2019 1016   LYMPHSABS 1.5 04/10/2019 1016   MONOABS 0.7 06/15/2016 1700   EOSABS 0.5 (H) 04/10/2019 1016   BASOSABS 0.1 04/10/2019 1016   Iron/TIBC/Ferritin/ %Sat No results found for: IRON, TIBC, FERRITIN, IRONPCTSAT Lipid Panel     Component Value Date/Time   CHOL 138 05/07/2019   CHOL 182 04/10/2019 1016   TRIG 91 05/07/2019   HDL 38 05/07/2019   HDL 43 04/10/2019 1016   LDLCALC 82 05/07/2019   LDLCALC 111 (H) 04/10/2019 1016   Hepatic Function Panel     Component Value Date/Time   PROT 6.8 04/10/2019 1016   ALBUMIN 4.3 04/10/2019 1016   AST 23 05/07/2019   ALT 27 05/07/2019   ALKPHOS 55 04/10/2019 1016   BILITOT 0.4 04/10/2019 1016       Component Value Date/Time   TSH 1.340 04/10/2019 1016  Results for YIDEL, TEUSCHER (MRN [id number]) as of 06/12/2019 16:42  Ref. Range 04/10/2019 10:16  Vitamin D, 25-Hydroxy Latest Ref Range: 30.0 - 100.0 ng/mL 37.8     OBESITY BEHAVIORAL INTERVENTION VISIT  Today's visit was # 5  Starting weight: 308 lbs Starting date: 04/10/19 Today's weight : Weight: 277 lb (125.6 kg)  Today's date: 06/12/2019 Total lbs lost to date: 31 lbs  Results for OMAURI, BOEVE (MRN [id number]) as of 06/12/2019 16:42  Ref. Range 04/10/2019 10:16  Vitamin D, 25-Hydroxy Latest Ref Range: 30.0 - 100.0 ng/mL 37.8    ASK: We discussed the diagnosis of obesity with Janalyn Shy today and [name] agreed to give [place] permission to discuss obesity behavioral modification therapy today.  ASSESS: Niklas has the diagnosis of obesity and his BMI today is 40.89. Roddie is in the action stage of change.   ADVISE: Bailee was educated on the multiple health risks of obesity as well as the benefit of weight loss to improve his health. He was advised of the need for long term treatment and the importance of lifestyle modifications to improve his current health and to decrease his risk of future health problems.  AGREE: Multiple dietary modification options and treatment options were discussed and Vann agreed to follow the recommendations documented in the above note.  ARRANGE: Rashan was educated on the importance of frequent visits to treat obesity as outlined per CMS and USPSTF guidelines and agreed to schedule his next follow up appointment today.  IMarcille Blanco, CMA, am acting as transcriptionist for Starlyn Skeans, MD I have reviewed the above documentation for accuracy and completeness, and I agree with the above. -Dennard Nip, MD

## 2019-06-19 ENCOUNTER — Ambulatory Visit (INDEPENDENT_AMBULATORY_CARE_PROVIDER_SITE_OTHER): Payer: 59 | Admitting: Psychology

## 2019-06-19 DIAGNOSIS — F4321 Adjustment disorder with depressed mood: Secondary | ICD-10-CM

## 2019-06-19 NOTE — Progress Notes (Signed)
Office: [phone]  /  Fax: [phone]    Date: June 21, 2019   Appointment Start Time: 2:30pm Duration: 30 minutes Provider: Glennie Isle, Psy.D. Type of Session: Individual Therapy  Location of Patient: Home Location of Provider: [facility] Type of Contact: Telepsychological Visit via [employer] WebEx   Session Content: Kenneth Green is a 53 y.o. male presenting via [employer] for a follow-up appointment to address the previously established treatment goal of decreasing emotional eating. Today's appointment was a telepsychological visit, as this provider's clinic is seeing a limited number of patients for in-person visits due to COVID-19. Therapeutic services will resume to in-person appointments once deemed appropriate. Kenneth Green expressed understanding regarding the rationale for telepsychological services, and provided verbal consent for today's appointment. Prior to proceeding with today's appointment, Kenneth Green's physical location at the time of this appointment was obtained. Kenneth Green reported he was at home and provided the address. In the event of technical difficulties, Kenneth Green shared a phone number he could be reached at. Kenneth Green and this provider participated in today's telepsychological service. Also, Kenneth Green denied anyone else being present in the room or on the WebEx appointment.  This provider conducted a brief check-in and verbally administered the PHQ-9 and GAD-7. Kenneth Green stated he has "been maintaining" and discussed experiencing "heavy duty temptations." This was explored further. Kenneth Green stated, "It would be fast food." He acknowledged he is not typically hungry when he has the urge to eat fast food. It was reflected it is likely out of habit as he would often stop at fast food places on his way to work. Notably, he has not stopped at any fast food restaurants. Kenneth Green also noted he often has an urge to eat larger portions and added, "It's almost a mindless thing."  [name] further shared, "That worries me when I don't think about it." As such, psychoeducation regarding the hunger and satisfaction scale was provided and he was encouraged to utilize it in the coming days before, during, and after a meal or snack to help increase awareness; Kenneth Green agreed. Kenneth Green provided verbal consent during today's appointment for this provider to send the handout for the scale via e-mail. In addition, psychoeducation regarding the connection between thoughts, feelings, and behaviors was provided to help highlight the benefits of learned skills for current stressors. Kenneth Green noted, "I think that's helpful." Notably, Kenneth Green stated he met with his new therapist, Kenneth Green, for two appointments (06/04/2019 and 06/19/2019). Their next appointment is on 07/04/2019. Focus of treatment has been self-care. At this time, Kenneth Green requested to terminate services and noted, "I think I would be okay transferring strictly to him at this point. I feel like I'm in control enough and aware enough." Overall, Kenneth Green was receptive to today's session as evidenced by openness to sharing, responsiveness to feedback, and willingness to engage in learned strategies.  Mental Status Examination:  Appearance: neat Behavior: cooperative Mood: euthymic Affect: mood congruent Speech: normal in rate, volume, and tone Eye Contact: appropriate Psychomotor Activity: appropriate Thought Process: linear, logical, and goal directed  Content/Perceptual Disturbances: denies suicidal and homicidal ideation, plan, and intent and no hallucinations, delusions, bizarre thinking or behavior reported or observed Orientation: time, person, place and purpose of appointment Cognition/Sensorium: memory, attention, language, and fund of knowledge intact  Insight: good Judgment: good  Structured Assessment Results: The Patient Health Questionnaire-9 (PHQ-9) is a self-report measure that assesses symptoms and severity of  depression over the course of the last two weeks. Kenneth Green obtained a score of 2 suggesting  minimal depression. Kenneth Green finds the endorsed symptoms to be not difficult at all. Little interest or pleasure in doing things 0  Feeling down, depressed, or hopeless 0  Trouble falling or staying asleep, or sleeping too much 1  Feeling tired or having little energy 0  Poor appetite or overeating 1  Feeling bad about yourself --- or that you are a failure or have let yourself or your family down 0  Trouble concentrating on things, such as reading the newspaper or watching television 0  Moving or speaking so slowly that other people could have noticed? Or the opposite --- being so fidgety or restless that you have been moving around a lot more than usual 0  Thoughts that you would be better off dead or hurting yourself in some way 0  PHQ-9 Score 2    The Generalized Anxiety Disorder-7 (GAD-7) is a brief self-report measure that assesses symptoms of anxiety over the course of the last two weeks. Kenneth Green obtained a score of 7 suggesting mild anxiety. Kenneth Green finds the endorsed symptoms to be not difficult at all. Feeling nervous, anxious, on edge 1  Not being able to stop or control worrying 1  Worrying too much about different things 1  Trouble relaxing 3  Being so restless that it's hard to sit still 1  Becoming easily annoyed or irritable 0  Feeling afraid as if something awful might happen 0  GAD-7 Score 7   Interventions:  Conducted a brief chart review Verbal administration of PHQ-9 and GAD-7 for symptom monitoring Provided empathic reflections and validation Psychoeducation provided regarding the connection between thoughts, feelings, and behaviors Psychoeducation provided regarding the hunger and satisfaction scale Employed supportive psychotherapy interventions to facilitate reduced distress, and to improve coping skills with identified stressors  DSM-5 Diagnosis: 296.31 (F33.0) Major  Depressive Disorder, Recurrent Episode, Mild, With Anxious Distress  Treatment Goal & Progress: During the initial appointment with this provider, the following treatment goal was established: decrease emotional eating. Kenneth Green has demonstrated progress in his goal as evidenced by increased awareness of hunger patterns and triggers for emotional eating. Despite urges to eat fast food, Kenneth Green noted he has not stopped at any places. He continues to demonstrate willingness to engage in learned skills.   Plan: Today was Kenneth Green's last appointment with this provider. His next appointment with his new provider, Kenneth Green, is scheduled for July 04, 2019.

## 2019-06-21 ENCOUNTER — Other Ambulatory Visit: Payer: Self-pay

## 2019-06-21 ENCOUNTER — Ambulatory Visit (INDEPENDENT_AMBULATORY_CARE_PROVIDER_SITE_OTHER): Payer: 59 | Admitting: Psychology

## 2019-06-21 DIAGNOSIS — F330 Major depressive disorder, recurrent, mild: Secondary | ICD-10-CM

## 2019-06-25 ENCOUNTER — Encounter (INDEPENDENT_AMBULATORY_CARE_PROVIDER_SITE_OTHER): Payer: Self-pay | Admitting: Family Medicine

## 2019-06-25 ENCOUNTER — Ambulatory Visit (INDEPENDENT_AMBULATORY_CARE_PROVIDER_SITE_OTHER): Payer: 59 | Admitting: Family Medicine

## 2019-06-25 ENCOUNTER — Other Ambulatory Visit: Payer: Self-pay

## 2019-06-25 VITALS — BP 137/84 | HR 75 | Temp 98.6°F | Ht 69.0 in | Wt 278.0 lb

## 2019-06-25 DIAGNOSIS — I10 Essential (primary) hypertension: Secondary | ICD-10-CM | POA: Diagnosis not present

## 2019-06-25 DIAGNOSIS — Z6841 Body Mass Index (BMI) 40.0 and over, adult: Secondary | ICD-10-CM | POA: Diagnosis not present

## 2019-06-25 DIAGNOSIS — F3289 Other specified depressive episodes: Secondary | ICD-10-CM | POA: Diagnosis not present

## 2019-06-26 NOTE — Progress Notes (Signed)
Office: [phone]  /  Fax: [phone]   HPI:   Chief Complaint: OBESITY Kenneth Green is here to discuss his progress with his obesity treatment plan. He is on the Category 4 plan and is following his eating plan approximately 80 % of the time. He states he is walking 40 minutes 5 times per week. [name] is retaining a bit of water today. He is struggling to follow his plan for his dinner, due to working third shift and being the caregiver for his disabled wife. His weight is 278 lb (126.1 kg) today and has had a weight gain of 1 pound over a period of 2 weeks since his last visit. He has lost 30 lbs since starting treatment with [place].  Hypertension RACIEL CAFFREY is a 53 y.o. male with hypertension. His blood pressure is improving with diet, exercise and weight loss. Janalyn Shy denies chest pain, headache or dizziness. He is working weight loss to help control his blood pressure with the goal of decreasing his risk of heart attack and stroke. Michaels blood pressure is currently controlled.  Depression with emotional eating behaviors Covey is doing well on medications. He is sometimes forgetting to take his second dose of Wellbutrin while at work. Lowen struggles with emotional eating and using food for comfort to the extent that it is negatively impacting his health. He often snacks when he is not hungry. Johnte sometimes feels he is out of control and then feels guilty that he made poor food choices. He has been working on behavior modification techniques to help reduce his emotional eating and has been somewhat successful. His blood pressure is stable and he denies insomnia. He shows no sign of suicidal or homicidal ideations.  ASSESSMENT AND PLAN:  Essential hypertension  Other depression  Class 3 severe obesity with serious comorbidity and body mass index (BMI) of 40.0 to 44.9 in adult, unspecified obesity type ([place])  PLAN:  Hypertension We discussed sodium restriction,  working on healthy weight loss, and a regular exercise program as the means to achieve improved blood pressure control. [name] agreed with this plan and agreed to follow up as directed. We will continue to monitor his blood pressure as well as his progress with the above lifestyle modifications. He will increase his water intake. [name] will continue his medications as prescribed and will watch for signs of hypotension as he continues his lifestyle modifications.  Depression with Emotional Eating Behaviors We discussed behavior modification techniques today to help [name] deal with his emotional eating and depression. He will continue Wellbutrin SR as prescribed and we discussed ways to help him remember the second dose. Christophe agreed to follow up as directed.  I spent > than 50% of the 25 minute visit on counseling as documented in the note.  Obesity [name] is currently in the action stage of change. As such, his goal is to continue with weight loss efforts He has agreed to keep a food journal with 450 to 650 calories and 45 grams of protein at supper daily and follow the Category 3 plan [name] has been instructed to work up to a goal of 150 minutes of combined cardio and strengthening exercise per week for weight loss and overall health benefits. We discussed the following Behavioral Modification Strategies today: better snacking choices, increasing lean protein intake, decreasing simple carbohydrates and work on meal planning and easy cooking plans  Chosen has agreed to follow up with our clinic in 2 weeks. He was informed of the  importance of frequent follow up visits to maximize his success with intensive lifestyle modifications for his multiple health conditions.  ALLERGIES: Allergies  Allergen Reactions  . Iohexol Hives and Rash    Patient needs pre medication per Dr. Zigmund Daniel    MEDICATIONS: Current Outpatient Medications on File Prior to Visit  Medication Sig Dispense Refill   . benazepril-hydrochlorthiazide (LOTENSIN HCT) 10-12.5 MG per tablet     . buPROPion (WELLBUTRIN SR) 200 MG 12 hr tablet Take 1 tablet (200 mg total) by mouth 2 (two) times daily. 60 tablet 0  . Cholecalciferol (VITAMIN D PO) Take by mouth. Take 1000 iu daily    . fexofenadine (ALLEGRA) 180 MG tablet Take 180 mg by mouth daily.    . Flaxseed, Linseed, (FLAX PO) Take 1 capsule by mouth daily.    Marland Kitchen gabapentin (NEURONTIN) 300 MG capsule Take 300 mg by mouth 3 (three) times daily.    . Vitamin D, Ergocalciferol, (DRISDOL) 1.25 MG (50000 UT) CAPS capsule Take 1 capsule (50,000 Units total) by mouth every 7 (seven) days. 4 capsule 0   No current facility-administered medications on file prior to visit.     PAST MEDICAL HISTORY: Past Medical History:  Diagnosis Date  . Anxiety   . Back pain   . Binge eating disorder   . Dyspnea   . Glaucoma   . Hypertension   . Lower extremity edema   . Major depressive disorder, recurrent, unspecified ([place])   . Mixed hyperlipidemia   . Obesity   . OSA (obstructive sleep apnea)   . Prediabetes   . Slow transit constipation     PAST SURGICAL HISTORY: Past Surgical History:  Procedure Laterality Date  . SPINAL FUSION      SOCIAL HISTORY: Social History   Tobacco Use  . Smoking status: Former Research scientist (life sciences)  . Smokeless tobacco: Never Used  Substance Use Topics  . Alcohol use: Yes    Comment: 2-3 beers weekly  . Drug use: No    FAMILY HISTORY: Family History  Problem Relation Age of Onset  . Heart disease Mother   . Hypertension Mother   . Kidney disease Mother   . Obesity Mother   . Stroke Father   . Hypertension Father   . Heart disease Father   . Sleep apnea Father   . Obesity Father   . Heart disease Sister   . Diabetes Sister     ROS: Review of Systems  Constitutional: Negative for weight loss.  Cardiovascular: Negative for chest pain.  Neurological: Negative for dizziness and headaches.  Psychiatric/Behavioral: Positive for  depression. Negative for suicidal ideas. The patient does not have insomnia.     PHYSICAL EXAM: Blood pressure 137/84, pulse 75, temperature 98.6 F (37 C), temperature source Oral, height 5\' 9"  (1.753 m), weight 278 lb (126.1 kg), SpO2 98 %. Body mass index is 41.05 kg/m. Physical Exam Vitals signs reviewed.  Constitutional:      Appearance: Normal appearance. He is well-developed. He is obese.  Cardiovascular:     Rate and Rhythm: Normal rate.  Pulmonary:     Effort: Pulmonary effort is normal.  Musculoskeletal: Normal range of motion.  Skin:    General: Skin is warm and dry.  Neurological:     Mental Status: He is alert and oriented to person, place, and time.  Psychiatric:        Mood and Affect: Mood normal.        Behavior: Behavior normal.  Thought Content: Thought content does not include homicidal or suicidal ideation.     RECENT LABS AND TESTS: BMET    Component Value Date/Time   NA 139 05/07/2019   K 4.8 05/07/2019   CL 104 04/10/2019 1016   CO2 21 04/10/2019 1016   GLUCOSE 99 04/10/2019 1016   GLUCOSE 106 (H) 06/15/2016 1700   BUN 22 (A) 05/07/2019   CREATININE 1.2 05/07/2019   CREATININE 1.15 04/10/2019 1016   CALCIUM 9.3 04/10/2019 1016   GFRNONAA 72 04/10/2019 1016   GFRAA 83 04/10/2019 1016   Lab Results  Component Value Date   HGBA1C 5.7 05/07/2019   HGBA1C 5.9 (H) 04/10/2019   Lab Results  Component Value Date   INSULIN 30.9 (H) 04/10/2019   CBC    Component Value Date/Time   WBC 6.0 04/10/2019 1016   WBC 7.7 06/15/2016 1700   RBC 4.77 04/10/2019 1016   RBC 4.87 06/15/2016 1700   HGB 13.4 04/10/2019 1016   HCT 39.2 04/10/2019 1016   PLT 235 06/15/2016 1700   MCV 82 04/10/2019 1016   MCH 28.1 04/10/2019 1016   MCH 29.0 06/15/2016 1700   MCHC 34.2 04/10/2019 1016   MCHC 34.6 06/15/2016 1700   RDW 13.2 04/10/2019 1016   LYMPHSABS 1.5 04/10/2019 1016   MONOABS 0.7 06/15/2016 1700   EOSABS 0.5 (H) 04/10/2019 1016   BASOSABS  0.1 04/10/2019 1016   Iron/TIBC/Ferritin/ %Sat No results found for: IRON, TIBC, FERRITIN, IRONPCTSAT Lipid Panel     Component Value Date/Time   CHOL 138 05/07/2019   CHOL 182 04/10/2019 1016   TRIG 91 05/07/2019   HDL 38 05/07/2019   HDL 43 04/10/2019 1016   LDLCALC 82 05/07/2019   LDLCALC 111 (H) 04/10/2019 1016   Hepatic Function Panel     Component Value Date/Time   PROT 6.8 04/10/2019 1016   ALBUMIN 4.3 04/10/2019 1016   AST 23 05/07/2019   ALT 27 05/07/2019   ALKPHOS 55 04/10/2019 1016   BILITOT 0.4 04/10/2019 1016      Component Value Date/Time   TSH 1.340 04/10/2019 1016     Ref. Range 04/10/2019 10:16  Vitamin D, 25-Hydroxy Latest Ref Range: 30.0 - 100.0 ng/mL 37.8    OBESITY BEHAVIORAL INTERVENTION VISIT  Today's visit was # 6   Starting weight: 308 lbs Starting date: 04/10/2019 Today's weight : 278 lbs Today's date: 06/25/2019 Total lbs lost to date: 30    06/25/2019  Height 5\' 9"  (1.753 m)  Weight 278 lb (126.1 kg)  BMI (Calculated) 41.03  BLOOD PRESSURE - SYSTOLIC 165  BLOOD PRESSURE - DIASTOLIC 84   Body Fat % 79.0 %  Total Body Water (lbs) 126.8 lbs    ASK: We discussed the diagnosis of obesity with Janalyn Shy today and [name] agreed to give [place] permission to discuss obesity behavioral modification therapy today.  ASSESS: Chadwick has the diagnosis of obesity and his BMI today is 41.03 Fabiano is in the action stage of change   ADVISE: Guiseppe was educated on the multiple health risks of obesity as well as the benefit of weight loss to improve his health. He was advised of the need for long term treatment and the importance of lifestyle modifications to improve his current health and to decrease his risk of future health problems.  AGREE: Multiple dietary modification options and treatment options were discussed and  Danel agreed to follow the recommendations documented in the above note.  ARRANGE: Raford was educated on  the  importance of frequent visits to treat obesity as outlined per CMS and USPSTF guidelines and agreed to schedule his next follow up appointment today.  I, Doreene Nest, am acting as transcriptionist for Dennard Nip, MD I have reviewed the above documentation for accuracy and completeness, and I agree with the above. -Dennard Nip, MD

## 2019-07-02 ENCOUNTER — Other Ambulatory Visit (INDEPENDENT_AMBULATORY_CARE_PROVIDER_SITE_OTHER): Payer: Self-pay | Admitting: Family Medicine

## 2019-07-02 DIAGNOSIS — E559 Vitamin D deficiency, unspecified: Secondary | ICD-10-CM

## 2019-07-04 ENCOUNTER — Ambulatory Visit (INDEPENDENT_AMBULATORY_CARE_PROVIDER_SITE_OTHER): Payer: 59 | Admitting: Psychology

## 2019-07-04 DIAGNOSIS — F4321 Adjustment disorder with depressed mood: Secondary | ICD-10-CM

## 2019-07-09 ENCOUNTER — Other Ambulatory Visit: Payer: Self-pay

## 2019-07-09 ENCOUNTER — Encounter (INDEPENDENT_AMBULATORY_CARE_PROVIDER_SITE_OTHER): Payer: Self-pay | Admitting: Family Medicine

## 2019-07-09 ENCOUNTER — Ambulatory Visit (INDEPENDENT_AMBULATORY_CARE_PROVIDER_SITE_OTHER): Payer: 59 | Admitting: Family Medicine

## 2019-07-09 VITALS — BP 128/87 | HR 66 | Temp 98.1°F | Ht 69.0 in | Wt 275.0 lb

## 2019-07-09 DIAGNOSIS — Z9189 Other specified personal risk factors, not elsewhere classified: Secondary | ICD-10-CM

## 2019-07-09 DIAGNOSIS — Z6841 Body Mass Index (BMI) 40.0 and over, adult: Secondary | ICD-10-CM

## 2019-07-09 DIAGNOSIS — E559 Vitamin D deficiency, unspecified: Secondary | ICD-10-CM | POA: Diagnosis not present

## 2019-07-09 DIAGNOSIS — F3289 Other specified depressive episodes: Secondary | ICD-10-CM

## 2019-07-09 MED ORDER — BUPROPION HCL ER (SR) 200 MG PO TB12: 200.0000 mg | ORAL_TABLET | Freq: Two times a day (BID) | ORAL | 0 refills | Status: DC

## 2019-07-09 MED ORDER — VITAMIN D (ERGOCALCIFEROL) 1.25 MG (50000 UNIT) PO CAPS: 50000.0000 [IU] | ORAL_CAPSULE | ORAL | 0 refills | Status: DC

## 2019-07-10 NOTE — Progress Notes (Signed)
Office: [phone]  /  Fax: [phone]   HPI:   Chief Complaint: OBESITY Kenneth Green is here to discuss his progress with his obesity treatment plan. He is on the Category 4 plan and is following his eating plan approximately 80% of the time. He states he is walking 40 minutes 5 times per week. Burle continues to do well with weight loss on his eating plan. Hunger is mostly controlled and he thinks he does better with structure versus journaling.  His weight is 275 lb (124.7 kg) today and has had a weight loss of 3 pounds over a period of 2 weeks since his last visit. He has lost 33 lbs since starting treatment with [place].  Vitamin D deficiency Kenneth Green has a diagnosis of Vitamin D deficiency. He is currently stable on prescription Vit D and denies nausea, vomiting or muscle weakness.  At risk for osteopenia and osteoporosis Kenneth Green is at higher risk of osteopenia and osteoporosis due to Vitamin D deficiency.   Depression  Kenneth Green's mood is stable on Wellbutrin. He reports his energy is good and his fatigue has decreased.  Depression screen PHQ 2/9 04/10/2019  Decreased Interest 3  Down, Depressed, Hopeless 2  PHQ - 2 Score 5  Altered sleeping 1  Tired, decreased energy 3  Change in appetite 3  Feeling bad or failure about yourself  2  Trouble concentrating 1  Moving slowly or fidgety/restless 3  Suicidal thoughts 0  PHQ-9 Score 18  Difficult doing work/chores Somewhat difficult   ASSESSMENT AND PLAN:  Vitamin D deficiency - Plan: Vitamin D, Ergocalciferol, (DRISDOL) 1.25 MG (50000 UT) CAPS capsule  Other depression - Plan: buPROPion (WELLBUTRIN SR) 200 MG 12 hr tablet  At risk for osteoporosis  Class 3 severe obesity with serious comorbidity and body mass index (BMI) of 40.0 to 44.9 in adult, unspecified obesity type ([facility])  PLAN:  Vitamin D Deficiency Kenneth Green was informed that low Vitamin D levels contributes to fatigue and are associated with obesity, breast, and  colon cancer. He agrees to continue to take prescription Vit D @ 50,000 IU every week #4 with 0 refills and will follow-up for routine testing of Vitamin D, at least 2-3 times per year. He was informed of the risk of over-replacement of Vitamin D and agrees to not increase his dose unless he discusses this with [place] first. Kenneth Green agrees to follow-up with our clinic in 2-3 weeks.  At risk for osteopenia and osteoporosis Kenneth Green was given extended  (15 minutes) osteoporosis prevention counseling today. Kenneth Green is at risk for osteopenia and osteoporosis due to his Vitamin D deficiency. He was encouraged to take his Vitamin D and follow his higher calcium diet and increase strengthening exercise to help strengthen his bones and decrease his risk of osteopenia and osteoporosis.  Depression  Kenneth Green was given a refill on his Wellbutrin 200 mg #60 with 0 refills and agrees to follow-up with our clinic in 2-3 weeks.  Obesity Kenneth Green is currently in the action stage of change. As such, his goal is to continue with weight loss efforts. He has agreed to follow the Category 4 plan. Kenneth Green has been instructed to work up to a goal of 150 minutes of combined cardio and strengthening exercise per week for weight loss and overall health benefits. We discussed the following Behavioral Modification Strategies today: increasing lean protein intake, decreasing simple carbohydrates  and work on meal planning and easy cooking plans.  Kenneth Green has agreed to follow-up with our clinic in 2-3  weeks. He was informed of the importance of frequent follow-up visits to maximize his success with intensive lifestyle modifications for his multiple health conditions.  ALLERGIES: Allergies  Allergen Reactions   Iohexol Hives and Rash    Patient needs pre medication per Kenneth Green    MEDICATIONS: Current Outpatient Medications on File Prior to Visit  Medication Sig Dispense Refill   benazepril-hydrochlorthiazide (LOTENSIN  HCT) 10-12.5 MG per tablet      Cholecalciferol (VITAMIN D PO) Take by mouth. Take 1000 iu daily     fexofenadine (ALLEGRA) 180 MG tablet Take 180 mg by mouth daily.     Flaxseed, Linseed, (FLAX PO) Take 1 capsule by mouth daily.     gabapentin (NEURONTIN) 300 MG capsule Take 300 mg by mouth 3 (three) times daily.     No current facility-administered medications on file prior to visit.     PAST MEDICAL HISTORY: Past Medical History:  Diagnosis Date   Anxiety    Back pain    Binge eating disorder    Dyspnea    Glaucoma    Hypertension    Lower extremity edema    Major depressive disorder, recurrent, unspecified ([place])    Mixed hyperlipidemia    Obesity    OSA (obstructive sleep apnea)    Prediabetes    Slow transit constipation     PAST SURGICAL HISTORY: Past Surgical History:  Procedure Laterality Date   SPINAL FUSION      SOCIAL HISTORY: Social History   Tobacco Use   Smoking status: Former Smoker   Smokeless tobacco: Never Used  Substance Use Topics   Alcohol use: Yes    Comment: 2-3 beers weekly   Drug use: No    FAMILY HISTORY: Family History  Problem Relation Age of Onset   Heart disease Mother    Hypertension Mother    Kidney disease Mother    Obesity Mother    Stroke Father    Hypertension Father    Heart disease Father    Sleep apnea Father    Obesity Father    Heart disease Sister    Diabetes Sister    ROS: Review of Systems  Gastrointestinal: Negative for nausea and vomiting.  Musculoskeletal:       Negative for muscle weakness.  Psychiatric/Behavioral: Positive for depression.   PHYSICAL EXAM: Blood pressure 128/87, pulse 66, temperature 98.1 F (36.7 C), temperature source Oral, height 5\' 9"  (1.753 m), weight 275 lb (124.7 kg), SpO2 99 %. Body mass index is 40.61 kg/m. Physical Exam Vitals signs reviewed.  Constitutional:      Appearance: Normal appearance. He is obese.  Cardiovascular:     Rate  and Rhythm: Normal rate.     Pulses: Normal pulses.  Pulmonary:     Effort: Pulmonary effort is normal.     Breath sounds: Normal breath sounds.  Musculoskeletal: Normal range of motion.  Skin:    General: Skin is warm and dry.  Neurological:     Mental Status: He is alert and oriented to person, place, and time.  Psychiatric:        Behavior: Behavior normal.   RECENT LABS AND TESTS: BMET    Component Value Date/Time   NA 139 05/07/2019   K 4.8 05/07/2019   CL 104 04/10/2019 1016   CO2 21 04/10/2019 1016   GLUCOSE 99 04/10/2019 1016   GLUCOSE 106 (H) 06/15/2016 1700   BUN 22 (A) 05/07/2019   CREATININE 1.2 05/07/2019   CREATININE 1.15 04/10/2019  1016   CALCIUM 9.3 04/10/2019 1016   GFRNONAA 72 04/10/2019 1016   GFRAA 83 04/10/2019 1016   Lab Results  Component Value Date   HGBA1C 5.7 05/07/2019   HGBA1C 5.9 (H) 04/10/2019   Lab Results  Component Value Date   INSULIN 30.9 (H) 04/10/2019   CBC    Component Value Date/Time   WBC 6.0 04/10/2019 1016   WBC 7.7 06/15/2016 1700   RBC 4.77 04/10/2019 1016   RBC 4.87 06/15/2016 1700   HGB 13.4 04/10/2019 1016   HCT 39.2 04/10/2019 1016   PLT 235 06/15/2016 1700   MCV 82 04/10/2019 1016   MCH 28.1 04/10/2019 1016   MCH 29.0 06/15/2016 1700   MCHC 34.2 04/10/2019 1016   MCHC 34.6 06/15/2016 1700   RDW 13.2 04/10/2019 1016   LYMPHSABS 1.5 04/10/2019 1016   MONOABS 0.7 06/15/2016 1700   EOSABS 0.5 (H) 04/10/2019 1016   BASOSABS 0.1 04/10/2019 1016   Iron/TIBC/Ferritin/ %Sat No results found for: IRON, TIBC, FERRITIN, IRONPCTSAT Lipid Panel     Component Value Date/Time   CHOL 138 05/07/2019   CHOL 182 04/10/2019 1016   TRIG 91 05/07/2019   HDL 38 05/07/2019   HDL 43 04/10/2019 1016   LDLCALC 82 05/07/2019   LDLCALC 111 (H) 04/10/2019 1016   Hepatic Function Panel     Component Value Date/Time   PROT 6.8 04/10/2019 1016   ALBUMIN 4.3 04/10/2019 1016   AST 23 05/07/2019   ALT 27 05/07/2019   ALKPHOS  55 04/10/2019 1016   BILITOT 0.4 04/10/2019 1016      Component Value Date/Time   TSH 1.340 04/10/2019 1016   Results for [place], RUMPLE (MRN [id number]) as of 07/10/2019 15:13  Ref. Range 04/10/2019 10:16  Vitamin D, 25-Hydroxy Latest Ref Range: 30.0 - 100.0 ng/mL 37.8   OBESITY BEHAVIORAL INTERVENTION VISIT  Today's visit was #7   Starting weight: 308 lbs Starting date: 04/10/2019 Today's weight: 275 lbs  Today's date: 07/09/2019 Total lbs lost to date: 33    07/09/2019  Height 5\' 9"  (1.753 m)  Weight 275 lb (124.7 kg)  BMI (Calculated) 40.59  BLOOD PRESSURE - SYSTOLIC 790  BLOOD PRESSURE - DIASTOLIC 87   Body Fat % 24.0 %  Total Body Water (lbs) 125.4 lbs   ASK: We discussed the diagnosis of obesity with Janalyn Shy today and [name] agreed to give [place] permission to discuss obesity behavioral modification therapy today.  ASSESS: Xavius has the diagnosis of obesity and his BMI today is 40.6. Shirley is in the action stage of change.   ADVISE: Antonia was educated on the multiple health risks of obesity as well as the benefit of weight loss to improve his health. He was advised of the need for long term treatment and the importance of lifestyle modifications to improve his current health and to decrease his risk of future health problems.  AGREE: Multiple dietary modification options and treatment options were discussed and  [place] agreed to follow the recommendations documented in the above note.  ARRANGE: Juanmanuel was educated on the importance of frequent visits to treat obesity as outlined per CMS and USPSTF guidelines and agreed to schedule his next follow up appointment today.  I, Michaelene Song, am acting as Location manager for Dennard Nip, MD  I have reviewed the above documentation for accuracy and completeness, and I agree with the above. -Dennard Nip, MD

## 2019-07-19 ENCOUNTER — Ambulatory Visit (INDEPENDENT_AMBULATORY_CARE_PROVIDER_SITE_OTHER): Payer: 59 | Admitting: Psychology

## 2019-07-19 DIAGNOSIS — F4321 Adjustment disorder with depressed mood: Secondary | ICD-10-CM

## 2019-07-23 ENCOUNTER — Other Ambulatory Visit: Payer: Self-pay

## 2019-07-23 ENCOUNTER — Ambulatory Visit (INDEPENDENT_AMBULATORY_CARE_PROVIDER_SITE_OTHER): Payer: 59 | Admitting: Family Medicine

## 2019-07-23 ENCOUNTER — Encounter (INDEPENDENT_AMBULATORY_CARE_PROVIDER_SITE_OTHER): Payer: Self-pay | Admitting: Family Medicine

## 2019-07-23 VITALS — BP 142/87 | HR 78 | Temp 98.0°F | Ht 69.0 in | Wt 274.0 lb

## 2019-07-23 DIAGNOSIS — I10 Essential (primary) hypertension: Secondary | ICD-10-CM

## 2019-07-23 DIAGNOSIS — Z6841 Body Mass Index (BMI) 40.0 and over, adult: Secondary | ICD-10-CM

## 2019-07-23 NOTE — Progress Notes (Signed)
Office: [phone]  /  Fax: [phone]   HPI:   Chief Complaint: OBESITY Kenneth Green is here to discuss his progress with his obesity treatment plan. He is on the Category 4 plan and is following his eating plan approximately 85 % of the time. He states he is walking for 45 minutes 5 times per week. Kenneth Green continue to do well with weight loss. 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  His weight is 274 lb (124.3 kg) today and has had a weight loss of 1 pound over a period of 2 weeks since his last visit. He has lost 34 lbs since starting treatment with [place].  Hypertension Kenneth Green is a 53 y.o. male with hypertension. Kenneth Green's blood pressure is elevated today. He is on lisinopril-hydrochlorothiazide and is working on diet but states he had a very stressful day today. He denies chest pain, headaches, or dizziness. He is working on weight loss to help control his blood pressure with the goal of decreasing his risk of heart attack and stroke. Kenneth Green's blood pressure is not currently controlled.  ASSESSMENT AND PLAN:  Essential hypertension  Class 3 severe obesity with serious comorbidity and body mass index (BMI) of 40.0 to 44.9 in adult, unspecified obesity type ([place])  PLAN:  Hypertension We discussed sodium restriction, working on healthy weight loss, and a regular exercise program as the means to achieve improved blood pressure control. Kenneth Green agreed with this plan and agreed to follow up as directed. We will continue to monitor his blood pressure as well as his progress with the above lifestyle modifications. Kenneth Green agrees to continue his medications and will watch for signs of hypotension as he continues his lifestyle modifications. Kenneth Green agrees to follow up with our clinic in 2 weeks, and we will recheck his blood pressure at that time.  I spent > than 50% of the 15 minute visit on counseling as  documented in the note.  Obesity Kenneth Green is currently in the action stage of change. As such, his goal is to continue with weight loss efforts He has agreed to follow the Category 4 plan Kenneth Green has been instructed to work up to a goal of 150 minutes of combined cardio and strengthening exercise per week for weight loss and overall health benefits. We discussed the following Behavioral Modification Strategies today: increasing lean protein intake, decreasing simple carbohydrates  and work on meal planning and easy cooking plans   Kenneth Green has agreed to follow up with our clinic in 2 weeks. He was informed of the importance of frequent follow up visits to maximize his success with intensive lifestyle modifications for his multiple health conditions.  ALLERGIES: Allergies  Allergen Reactions  . Iohexol Hives and Rash    Patient needs pre medication per Dr. Zigmund Daniel    MEDICATIONS: Current Outpatient Medications on File Prior to Visit  Medication Sig Dispense Refill  . benazepril-hydrochlorthiazide (LOTENSIN HCT) 10-12.5 MG per tablet     . buPROPion (WELLBUTRIN SR) 200 MG 12 hr tablet Take 1 tablet (200 mg total) by mouth 2 (two) times daily. 60 tablet 0  . Cholecalciferol (VITAMIN D PO) Take by mouth. Take 1000 iu daily    . fexofenadine (ALLEGRA) 180 MG tablet Take 180 mg by mouth daily.    . Flaxseed, Linseed, (FLAX PO) Take 1 capsule by mouth daily.    Marland Kitchen gabapentin (NEURONTIN) 300 MG capsule Take 300 mg by mouth 3 (  three) times daily.    . Vitamin D, Ergocalciferol, (DRISDOL) 1.25 MG (50000 UT) CAPS capsule Take 1 capsule (50,000 Units total) by mouth every 7 (seven) days. 4 capsule 0   No current facility-administered medications on file prior to visit.     PAST MEDICAL HISTORY: Past Medical History:  Diagnosis Date  . Anxiety   . Back pain   . Binge eating disorder   . Dyspnea   . Glaucoma   . Hypertension   . Lower extremity edema   . Major depressive disorder,  recurrent, unspecified ([place])   . Mixed hyperlipidemia   . Obesity   . OSA (obstructive sleep apnea)   . Prediabetes   . Slow transit constipation     PAST SURGICAL HISTORY: Past Surgical History:  Procedure Laterality Date  . SPINAL FUSION      SOCIAL HISTORY: Social History   Tobacco Use  . Smoking status: Former Research scientist (life sciences)  . Smokeless tobacco: Never Used  Substance Use Topics  . Alcohol use: Yes    Comment: 2-3 beers weekly  . Drug use: No    FAMILY HISTORY: Family History  Problem Relation Age of Onset  . Heart disease Mother   . Hypertension Mother   . Kidney disease Mother   . Obesity Mother   . Stroke Father   . Hypertension Father   . Heart disease Father   . Sleep apnea Father   . Obesity Father   . Heart disease Sister   . Diabetes Sister     ROS: Review of Systems  Constitutional: Positive for weight loss.  Cardiovascular: Negative for chest pain.  Neurological: Negative for dizziness and headaches.    PHYSICAL EXAM: Blood pressure (!) 142/87, pulse 78, temperature 98 F (36.7 C), temperature source Oral, height 5\' 9"  (1.753 m), weight 274 lb (124.3 kg), SpO2 98 %. Body mass index is 40.46 kg/m. Physical Exam Vitals signs reviewed.  Constitutional:      Appearance: Normal appearance. He is obese.  Cardiovascular:     Rate and Rhythm: Normal rate.     Pulses: Normal pulses.  Pulmonary:     Effort: Pulmonary effort is normal.     Breath sounds: Normal breath sounds.  Musculoskeletal: Normal range of motion.  Skin:    General: Skin is warm and dry.  Neurological:     Mental Status: He is alert and oriented to person, place, and time.  Psychiatric:        Mood and Affect: Mood normal.        Behavior: Behavior normal.     RECENT LABS AND TESTS: BMET    Component Value Date/Time   NA 139 05/07/2019   K 4.8 05/07/2019   CL 104 04/10/2019 1016   CO2 21 04/10/2019 1016   GLUCOSE 99 04/10/2019 1016   GLUCOSE 106 (H) 06/15/2016 1700    BUN 22 (A) 05/07/2019   CREATININE 1.2 05/07/2019   CREATININE 1.15 04/10/2019 1016   CALCIUM 9.3 04/10/2019 1016   GFRNONAA 72 04/10/2019 1016   GFRAA 83 04/10/2019 1016   Lab Results  Component Value Date   HGBA1C 5.7 05/07/2019   HGBA1C 5.9 (H) 04/10/2019   Lab Results  Component Value Date   INSULIN 30.9 (H) 04/10/2019   CBC    Component Value Date/Time   WBC 6.0 04/10/2019 1016   WBC 7.7 06/15/2016 1700   RBC 4.77 04/10/2019 1016   RBC 4.87 06/15/2016 1700   HGB 13.4 04/10/2019 1016  HCT 39.2 04/10/2019 1016   PLT 235 06/15/2016 1700   MCV 82 04/10/2019 1016   MCH 28.1 04/10/2019 1016   MCH 29.0 06/15/2016 1700   MCHC 34.2 04/10/2019 1016   MCHC 34.6 06/15/2016 1700   RDW 13.2 04/10/2019 1016   LYMPHSABS 1.5 04/10/2019 1016   MONOABS 0.7 06/15/2016 1700   EOSABS 0.5 (H) 04/10/2019 1016   BASOSABS 0.1 04/10/2019 1016   Iron/TIBC/Ferritin/ %Sat No results found for: IRON, TIBC, FERRITIN, IRONPCTSAT Lipid Panel     Component Value Date/Time   CHOL 138 05/07/2019   CHOL 182 04/10/2019 1016   TRIG 91 05/07/2019   HDL 38 05/07/2019   HDL 43 04/10/2019 1016   LDLCALC 82 05/07/2019   LDLCALC 111 (H) 04/10/2019 1016   Hepatic Function Panel     Component Value Date/Time   PROT 6.8 04/10/2019 1016   ALBUMIN 4.3 04/10/2019 1016   AST 23 05/07/2019   ALT 27 05/07/2019   ALKPHOS 55 04/10/2019 1016   BILITOT 0.4 04/10/2019 1016      Component Value Date/Time   TSH 1.340 04/10/2019 1016      OBESITY BEHAVIORAL INTERVENTION VISIT  Today's visit was # 8   Starting weight: 308 lbs Starting date: 04/10/2019 Today's weight : 274 lbs Today's date: 07/23/2019 Total lbs lost to date: 62    ASK: We discussed the diagnosis of obesity with Kenneth Green today and Kenneth Green agreed to give [place] permission to discuss obesity behavioral modification therapy today.  ASSESS: Kenneth Green has the diagnosis of obesity and his BMI today is 40.44 Helio is in the action  stage of change   ADVISE: Stonie was educated on the multiple health risks of obesity as well as the benefit of weight loss to improve his health. He was advised of the need for long term treatment and the importance of lifestyle modifications to improve his current health and to decrease his risk of future health problems.  AGREE: Multiple dietary modification options and treatment options were discussed and  Diontae agreed to follow the recommendations documented in the above note.  ARRANGE: Christiano was educated on the importance of frequent visits to treat obesity as outlined per CMS and USPSTF guidelines and agreed to schedule his next follow up appointment today.  I, Trixie Dredge, am acting as transcriptionist for Dennard Nip, MD I have reviewed the above documentation for accuracy and completeness, and I agree with the above. -Dennard Nip, MD

## 2019-08-02 ENCOUNTER — Other Ambulatory Visit (INDEPENDENT_AMBULATORY_CARE_PROVIDER_SITE_OTHER): Payer: Self-pay | Admitting: Family Medicine

## 2019-08-02 DIAGNOSIS — F3289 Other specified depressive episodes: Secondary | ICD-10-CM

## 2019-08-06 ENCOUNTER — Other Ambulatory Visit: Payer: Self-pay

## 2019-08-06 ENCOUNTER — Encounter (INDEPENDENT_AMBULATORY_CARE_PROVIDER_SITE_OTHER): Payer: Self-pay | Admitting: Family Medicine

## 2019-08-06 ENCOUNTER — Ambulatory Visit (INDEPENDENT_AMBULATORY_CARE_PROVIDER_SITE_OTHER): Payer: 59 | Admitting: Family Medicine

## 2019-08-06 VITALS — BP 143/85 | HR 77 | Temp 98.1°F | Ht 69.0 in | Wt 274.0 lb

## 2019-08-06 DIAGNOSIS — F3289 Other specified depressive episodes: Secondary | ICD-10-CM | POA: Diagnosis not present

## 2019-08-06 DIAGNOSIS — I10 Essential (primary) hypertension: Secondary | ICD-10-CM

## 2019-08-06 DIAGNOSIS — Z6841 Body Mass Index (BMI) 40.0 and over, adult: Secondary | ICD-10-CM

## 2019-08-06 DIAGNOSIS — Z9189 Other specified personal risk factors, not elsewhere classified: Secondary | ICD-10-CM | POA: Diagnosis not present

## 2019-08-06 DIAGNOSIS — E559 Vitamin D deficiency, unspecified: Secondary | ICD-10-CM | POA: Diagnosis not present

## 2019-08-06 MED ORDER — BENAZEPRIL-HYDROCHLOROTHIAZIDE 10-12.5 MG PO TABS: 1.0000 | ORAL_TABLET | Freq: Two times a day (BID) | ORAL | 0 refills | Status: DC

## 2019-08-06 MED ORDER — BUPROPION HCL ER (SR) 200 MG PO TB12: 200.0000 mg | ORAL_TABLET | Freq: Two times a day (BID) | ORAL | 0 refills | Status: DC

## 2019-08-06 MED ORDER — VITAMIN D (ERGOCALCIFEROL) 1.25 MG (50000 UNIT) PO CAPS: 50000.0000 [IU] | ORAL_CAPSULE | ORAL | 0 refills | Status: DC

## 2019-08-07 NOTE — Progress Notes (Signed)
Office: [phone]  /  Fax: [phone]   HPI:   Chief Complaint: OBESITY Kenneth Green is here to discuss his progress with his obesity treatment plan. He is on the Category 4 plan and is following his eating plan approximately 80 % of the time. He states he is walking 45 minutes 5 times per week. Kenneth Green had increased celebration eating in the last week, but he has done well with maintaining his weight. Kenneth Green is happy with his plan overall, but he is getting some sabotage from his wife. His weight is 274 lb (124.3 kg) today and he has maintained weight since his last visit. He has lost 34 lbs since starting treatment with [place].  Hypertension Kenneth Green is a 53 y.o. male with hypertension. His blood pressure was elevated two times in a row on Lotensin. Kenneth Green denies chest pain, headache or dizziness. He is working weight loss to help control his blood pressure with the goal of decreasing his risk of heart attack and stroke. Kenneth Green blood pressure is not currently controlled.  At risk for cardiovascular disease Kenneth Green is at a higher than average risk for cardiovascular disease due to obesity and hypertension. He currently denies any chest pain.  Vitamin D deficiency Kenneth Green has a diagnosis of vitamin D deficiency. Kenneth Green is stable on vit D and he denies nausea, vomiting or muscle weakness.  ASSESSMENT AND PLAN:  Essential hypertension - Plan: benazepril-hydrochlorthiazide (LOTENSIN HCT) 10-12.5 MG tablet  Vitamin D deficiency - Plan: Vitamin D, Ergocalciferol, (DRISDOL) 1.25 MG (50000 UT) CAPS capsule  Other depression - with emotional eating  - Plan: buPROPion (WELLBUTRIN SR) 200 MG 12 hr tablet  At risk for heart disease  Class 3 severe obesity with serious comorbidity and body mass index (BMI) of 40.0 to 44.9 in adult, unspecified obesity type ([facility])  PLAN:  Hypertension We discussed sodium restriction, working on healthy weight loss, and a regular exercise  program as the means to achieve improved blood pressure control. Kenneth Green agreed with this plan and agreed to follow up as directed. We will continue to monitor his blood pressure as well as his progress with the above lifestyle modifications. Kenneth Green agrees to increase Lotensin 10-12.5 mg to two times daily #60 with no refills and he will watch for signs of hypotension as he continues his lifestyle modifications.  Cardiovascular risk counseling Kenneth Green was given extended (15 minutes) coronary artery disease prevention counseling today. He is 53 y.o. male and has risk factors for heart disease including obesity and hypertension. We discussed intensive lifestyle modifications today with an emphasis on specific weight loss instructions and strategies. Pt was also informed of the importance of increasing exercise and decreasing saturated fats to help prevent heart disease.  Vitamin D Deficiency Kenneth Green was informed that low vitamin D levels contributes to fatigue and are associated with obesity, breast, and colon cancer. [place] agrees to continue to take prescription Vit D @50 ,000 IU every week #4 with no refills and he will follow up for routine testing of vitamin D, at least 2-3 times per year. He was informed of the risk of over-replacement of vitamin D and agrees to not increase his dose unless he discusses this with [place] first. Kenneth Green agrees to follow up with our clinic in 2 to 3 weeks.  Obesity Kenneth Green is currently in the action stage of change. As such, his goal is to continue with weight loss efforts He has agreed to follow the Category 4 plan and dinner options with shredded  chicken Kenneth Green has been instructed to work up to a goal of 150 minutes of combined cardio and strengthening exercise per week for weight loss and overall health benefits. We discussed the following Behavioral Modification Strategies today: no skipping meals, increasing lean protein intake, decreasing simple carbohydrates ,  dealing with family or coworker sabotage and emotional eating strategies Recipes were given to patient today.  Kenneth Green has agreed to follow up with our clinic in 2 to 3 weeks. He was informed of the importance of frequent follow up visits to maximize his success with intensive lifestyle modifications for his multiple health conditions.  ALLERGIES: Allergies  Allergen Reactions  . Iohexol Hives and Rash    Patient needs pre medication per Dr. Zigmund Daniel    MEDICATIONS: Current Outpatient Medications on File Prior to Visit  Medication Sig Dispense Refill  . Cholecalciferol (VITAMIN D PO) Take by mouth. Take 1000 iu daily    . fexofenadine (ALLEGRA) 180 MG tablet Take 180 mg by mouth daily.    . Flaxseed, Linseed, (FLAX PO) Take 1 capsule by mouth daily.    Marland Kitchen gabapentin (NEURONTIN) 300 MG capsule Take 300 mg by mouth 3 (three) times daily.     No current facility-administered medications on file prior to visit.     PAST MEDICAL HISTORY: Past Medical History:  Diagnosis Date  . Anxiety   . Back pain   . Binge eating disorder   . Dyspnea   . Glaucoma   . Hypertension   . Lower extremity edema   . Major depressive disorder, recurrent, unspecified ([place])   . Mixed hyperlipidemia   . Obesity   . OSA (obstructive sleep apnea)   . Prediabetes   . Slow transit constipation     PAST SURGICAL HISTORY: Past Surgical History:  Procedure Laterality Date  . SPINAL FUSION      SOCIAL HISTORY: Social History   Tobacco Use  . Smoking status: Former Research scientist (life sciences)  . Smokeless tobacco: Never Used  Substance Use Topics  . Alcohol use: Yes    Comment: 2-3 beers weekly  . Drug use: No    FAMILY HISTORY: Family History  Problem Relation Age of Onset  . Heart disease Mother   . Hypertension Mother   . Kidney disease Mother   . Obesity Mother   . Stroke Father   . Hypertension Father   . Heart disease Father   . Sleep apnea Father   . Obesity Father   . Heart disease Sister   .  Diabetes Sister     ROS: Review of Systems  Constitutional: Negative for weight loss.  Cardiovascular: Negative for chest pain.  Gastrointestinal: Negative for nausea and vomiting.  Musculoskeletal:       Negative for muscle weakness  Neurological: Negative for dizziness and headaches.    PHYSICAL EXAM: Blood pressure (!) 143/85, pulse 77, temperature 98.1 F (36.7 C), temperature source Oral, height 5\' 9"  (1.753 m), weight 274 lb (124.3 kg), SpO2 98 %. Body mass index is 40.46 kg/m. Physical Exam Vitals signs reviewed.  Constitutional:      Appearance: Normal appearance. He is well-developed. He is obese.  Cardiovascular:     Rate and Rhythm: Normal rate.  Pulmonary:     Effort: Pulmonary effort is normal.  Musculoskeletal: Normal range of motion.  Skin:    General: Skin is warm and dry.  Neurological:     Mental Status: He is alert and oriented to person, place, and time.  Psychiatric:  Mood and Affect: Mood normal.        Behavior: Behavior normal.     RECENT LABS AND TESTS: BMET    Component Value Date/Time   NA 139 05/07/2019   K 4.8 05/07/2019   CL 104 04/10/2019 1016   CO2 21 04/10/2019 1016   GLUCOSE 99 04/10/2019 1016   GLUCOSE 106 (H) 06/15/2016 1700   BUN 22 (A) 05/07/2019   CREATININE 1.2 05/07/2019   CREATININE 1.15 04/10/2019 1016   CALCIUM 9.3 04/10/2019 1016   GFRNONAA 72 04/10/2019 1016   GFRAA 83 04/10/2019 1016   Lab Results  Component Value Date   HGBA1C 5.7 05/07/2019   HGBA1C 5.9 (H) 04/10/2019   Lab Results  Component Value Date   INSULIN 30.9 (H) 04/10/2019   CBC    Component Value Date/Time   WBC 6.0 04/10/2019 1016   WBC 7.7 06/15/2016 1700   RBC 4.77 04/10/2019 1016   RBC 4.87 06/15/2016 1700   HGB 13.4 04/10/2019 1016   HCT 39.2 04/10/2019 1016   PLT 235 06/15/2016 1700   MCV 82 04/10/2019 1016   MCH 28.1 04/10/2019 1016   MCH 29.0 06/15/2016 1700   MCHC 34.2 04/10/2019 1016   MCHC 34.6 06/15/2016 1700    RDW 13.2 04/10/2019 1016   LYMPHSABS 1.5 04/10/2019 1016   MONOABS 0.7 06/15/2016 1700   EOSABS 0.5 (H) 04/10/2019 1016   BASOSABS 0.1 04/10/2019 1016   Iron/TIBC/Ferritin/ %Sat No results found for: IRON, TIBC, FERRITIN, IRONPCTSAT Lipid Panel     Component Value Date/Time   CHOL 138 05/07/2019   CHOL 182 04/10/2019 1016   TRIG 91 05/07/2019   HDL 38 05/07/2019   HDL 43 04/10/2019 1016   LDLCALC 82 05/07/2019   LDLCALC 111 (H) 04/10/2019 1016   Hepatic Function Panel     Component Value Date/Time   PROT 6.8 04/10/2019 1016   ALBUMIN 4.3 04/10/2019 1016   AST 23 05/07/2019   ALT 27 05/07/2019   ALKPHOS 55 04/10/2019 1016   BILITOT 0.4 04/10/2019 1016      Component Value Date/Time   TSH 1.340 04/10/2019 1016     Ref. Range 04/10/2019 10:16  Vitamin D, 25-Hydroxy Latest Ref Range: 30.0 - 100.0 ng/mL 37.8    OBESITY BEHAVIORAL INTERVENTION VISIT  Today's visit was # 9   Starting weight: 308 lbs Starting date: 04/10/2019 Today's weight : 274 lbs Today's date: 08/06/2019 Total lbs lost to date: 34    08/06/2019  Height 5\' 9"  (1.753 m)  Weight 274 lb (124.3 kg)  BMI (Calculated) 40.44  BLOOD PRESSURE - SYSTOLIC [id number]  BLOOD PRESSURE - DIASTOLIC 85   Body Fat % [id number] %  Total Body Water (lbs) 124.8 lbs    ASK: We discussed the diagnosis of obesity with Kenneth Green today and Kenneth Green agreed to give [place] permission to discuss obesity behavioral modification therapy today.  ASSESS: Kenneth Green has the diagnosis of obesity and his BMI today is 40.44 [place] is in the action stage of change   ADVISE: Daxton was educated on the multiple health risks of obesity as well as the benefit of weight loss to improve his health. He was advised of the need for long term treatment and the importance of lifestyle modifications to improve his current health and to decrease his risk of future health problems.  AGREE: Multiple dietary modification options and treatment options were  discussed and  Kenneth Green agreed to follow the recommendations documented in the above note.  ARRANGE: Kenneth Green was educated on the importance of frequent visits to treat obesity as outlined per CMS and USPSTF guidelines and agreed to schedule his next follow up appointment today.  I, Kenneth Green, am acting as transcriptionist for Dennard Nip, MD  I have reviewed the above documentation for accuracy and completeness, and I agree with the above. -Dennard Nip, MD

## 2019-08-14 ENCOUNTER — Ambulatory Visit (INDEPENDENT_AMBULATORY_CARE_PROVIDER_SITE_OTHER): Payer: 59 | Admitting: Psychology

## 2019-08-14 DIAGNOSIS — F4321 Adjustment disorder with depressed mood: Secondary | ICD-10-CM

## 2019-08-20 ENCOUNTER — Ambulatory Visit (INDEPENDENT_AMBULATORY_CARE_PROVIDER_SITE_OTHER): Payer: 59 | Admitting: Family Medicine

## 2019-08-20 ENCOUNTER — Other Ambulatory Visit: Payer: Self-pay

## 2019-08-20 ENCOUNTER — Encounter (INDEPENDENT_AMBULATORY_CARE_PROVIDER_SITE_OTHER): Payer: Self-pay | Admitting: Family Medicine

## 2019-08-20 VITALS — BP 132/78 | HR 74 | Temp 97.7°F | Ht 69.0 in | Wt 271.0 lb

## 2019-08-20 DIAGNOSIS — R7303 Prediabetes: Secondary | ICD-10-CM

## 2019-08-20 DIAGNOSIS — E559 Vitamin D deficiency, unspecified: Secondary | ICD-10-CM | POA: Diagnosis not present

## 2019-08-20 DIAGNOSIS — Z9189 Other specified personal risk factors, not elsewhere classified: Secondary | ICD-10-CM | POA: Diagnosis not present

## 2019-08-20 DIAGNOSIS — Z6841 Body Mass Index (BMI) 40.0 and over, adult: Secondary | ICD-10-CM

## 2019-08-20 MED ORDER — VITAMIN D (ERGOCALCIFEROL) 1.25 MG (50000 UNIT) PO CAPS: 50000.0000 [IU] | ORAL_CAPSULE | ORAL | 0 refills | Status: DC

## 2019-08-22 NOTE — Progress Notes (Signed)
Office: [phone]  /  Fax: [phone]   HPI:   Chief Complaint: OBESITY Kenneth Green is here to discuss his progress with his obesity treatment plan. He is on the Category 4 plan and is following his eating plan approximately 80 to 85 % of the time. He states he is walking 45 minutes 5 times per week. Kenneth Green continues to do very well with weight loss. He is going to be out of town next week, at a [employer] war re-enactment, but feels he will do well with eating in that situation. His weight is 271 lb (122.9 kg) today and has had a weight loss of 3 pounds over a period of 2 weeks since his last visit. He has lost 37 lbs since starting treatment with [place].  Vitamin D deficiency Kenneth Green has a diagnosis of vitamin D deficiency. Kenneth Green is stable on vit D, but he is not yet at goal. He denies nausea, vomiting or muscle weakness.  Pre-Diabetes Kenneth Green has a diagnosis of prediabetes based on his elevated Hgb A1c and was informed this puts him at greater risk of developing diabetes. He is working on diet and exercise, and he is doing well. His last A1c was at 5.7. He is not taking medications currently. Kenneth Green continues to work on diet and exercise to decrease the risk of diabetes. He denies nausea or hypoglycemia.  At risk for diabetes Kenneth Green is at higher than average risk for developing diabetes due to his obesity and prediabetes. He currently denies polyuria or polydipsia.  ASSESSMENT AND PLAN:  Vitamin D deficiency - Plan: Vitamin D, Ergocalciferol, (DRISDOL) 1.25 MG (50000 UT) CAPS capsule  Prediabetes  At risk for diabetes mellitus  Class 3 severe obesity with serious comorbidity and body mass index (BMI) of 40.0 to 44.9 in adult, unspecified obesity type ([name])  PLAN:  Vitamin D Deficiency Kenneth Green was informed that low vitamin D levels contributes to fatigue and are associated with obesity, breast, and colon cancer. Kenneth Green agrees to continue to take prescription Vit D @50 ,000  IU every week #4 with no refills and he will follow up for routine testing of vitamin D, at least 2-3 times per year. He was informed of the risk of over-replacement of vitamin D and agrees to not increase his dose unless he discusses this with [place] first. We will recheck labs at the next visit and Durelle agrees to follow up as directed.  Pre-Diabetes Kenneth Green will continue to work on weight loss, exercise, and decreasing simple carbohydrates in his diet to help decrease the risk of diabetes. He was informed that eating too many simple carbohydrates or too many calories at one sitting increases the likelihood of GI side effects. We will recheck labs in 2 to 3 weeks and Kenneth Green will follow up with [place] as directed to monitor his progress.  Diabetes risk counseling Kenneth Green was given extended (15 minutes) diabetes prevention counseling today. He is 53 y.o. male and has risk factors for diabetes including obesity and prediabetes. We discussed intensive lifestyle modifications today with an emphasis on weight loss as well as increasing exercise and decreasing simple carbohydrates in his diet.  Obesity Kenneth Green is currently in the action stage of change. As such, his goal is to continue with weight loss efforts He has agreed to follow the Category 4 plan Kenneth Green has been instructed to work up to a goal of 150 minutes of combined cardio and strengthening exercise per week for weight loss and overall health benefits. We discussed the following Behavioral  Modification Strategies today: increasing lean protein intake and decreasing simple carbohydrates   Kenneth Green has agreed to follow up with our clinic in 2 to 3 weeks fasting. He was informed of the importance of frequent follow up visits to maximize his success with intensive lifestyle modifications for his multiple health conditions.  ALLERGIES: Allergies  Allergen Reactions  . Iohexol Hives and Rash    Patient needs pre medication per Dr. Zigmund Daniel     MEDICATIONS: Current Outpatient Medications on File Prior to Visit  Medication Sig Dispense Refill  . benazepril-hydrochlorthiazide (LOTENSIN HCT) 10-12.5 MG tablet Take 1 tablet by mouth 2 (two) times daily. 60 tablet 0  . buPROPion (WELLBUTRIN SR) 200 MG 12 hr tablet Take 1 tablet (200 mg total) by mouth 2 (two) times daily. 60 tablet 0  . Cholecalciferol (VITAMIN D PO) Take by mouth. Take 1000 iu daily    . fexofenadine (ALLEGRA) 180 MG tablet Take 180 mg by mouth daily.    . Flaxseed, Linseed, (FLAX PO) Take 1 capsule by mouth daily.    Marland Kitchen gabapentin (NEURONTIN) 300 MG capsule Take 300 mg by mouth 3 (three) times daily.     No current facility-administered medications on file prior to visit.     PAST MEDICAL HISTORY: Past Medical History:  Diagnosis Date  . Anxiety   . Back pain   . Binge eating disorder   . Dyspnea   . Glaucoma   . Hypertension   . Lower extremity edema   . Major depressive disorder, recurrent, unspecified ([place])   . Mixed hyperlipidemia   . Obesity   . OSA (obstructive sleep apnea)   . Prediabetes   . Slow transit constipation     PAST SURGICAL HISTORY: Past Surgical History:  Procedure Laterality Date  . SPINAL FUSION      SOCIAL HISTORY: Social History   Tobacco Use  . Smoking status: Former Research scientist (life sciences)  . Smokeless tobacco: Never Used  Substance Use Topics  . Alcohol use: Yes    Comment: 2-3 beers weekly  . Drug use: No    FAMILY HISTORY: Family History  Problem Relation Age of Onset  . Heart disease Mother   . Hypertension Mother   . Kidney disease Mother   . Obesity Mother   . Stroke Father   . Hypertension Father   . Heart disease Father   . Sleep apnea Father   . Obesity Father   . Heart disease Sister   . Diabetes Sister     ROS: Review of Systems  Constitutional: Positive for weight loss.  Gastrointestinal: Negative for nausea and vomiting.  Genitourinary: Negative for frequency.  Musculoskeletal:       Negative for  muscle weakness  Endo/Heme/Allergies: Negative for polydipsia.       Negative for hypoglycemia    PHYSICAL EXAM: Blood pressure 132/78, pulse 74, temperature 97.7 F (36.5 C), temperature source Oral, height 5\' 9"  (1.753 m), weight 271 lb (122.9 kg), SpO2 98 %. Body mass index is 40.02 kg/m. Physical Exam Vitals signs reviewed.  Constitutional:      Appearance: Normal appearance. He is well-developed. He is obese.  Cardiovascular:     Rate and Rhythm: Normal rate.  Pulmonary:     Effort: Pulmonary effort is normal.  Musculoskeletal: Normal range of motion.  Skin:    General: Skin is warm and dry.  Neurological:     Mental Status: He is alert and oriented to person, place, and time.  Psychiatric:  Mood and Affect: Mood normal.        Behavior: Behavior normal.     RECENT LABS AND TESTS: BMET    Component Value Date/Time   NA 139 05/07/2019   K 4.8 05/07/2019   CL 104 04/10/2019 1016   CO2 21 04/10/2019 1016   GLUCOSE 99 04/10/2019 1016   GLUCOSE 106 (H) 06/15/2016 1700   BUN 22 (A) 05/07/2019   CREATININE 1.2 05/07/2019   CREATININE 1.15 04/10/2019 1016   CALCIUM 9.3 04/10/2019 1016   GFRNONAA 72 04/10/2019 1016   GFRAA 83 04/10/2019 1016   Lab Results  Component Value Date   HGBA1C 5.7 05/07/2019   HGBA1C 5.9 (H) 04/10/2019   Lab Results  Component Value Date   INSULIN 30.9 (H) 04/10/2019   CBC    Component Value Date/Time   WBC 6.0 04/10/2019 1016   WBC 7.7 06/15/2016 1700   RBC 4.77 04/10/2019 1016   RBC 4.87 06/15/2016 1700   HGB 13.4 04/10/2019 1016   HCT 39.2 04/10/2019 1016   PLT 235 06/15/2016 1700   MCV 82 04/10/2019 1016   MCH 28.1 04/10/2019 1016   MCH 29.0 06/15/2016 1700   MCHC 34.2 04/10/2019 1016   MCHC 34.6 06/15/2016 1700   RDW 13.2 04/10/2019 1016   LYMPHSABS 1.5 04/10/2019 1016   MONOABS 0.7 06/15/2016 1700   EOSABS 0.5 (H) 04/10/2019 1016   BASOSABS 0.1 04/10/2019 1016   Iron/TIBC/Ferritin/ %Sat No results found  for: IRON, TIBC, FERRITIN, IRONPCTSAT Lipid Panel     Component Value Date/Time   CHOL 138 05/07/2019   CHOL 182 04/10/2019 1016   TRIG 91 05/07/2019   HDL 38 05/07/2019   HDL 43 04/10/2019 1016   LDLCALC 82 05/07/2019   LDLCALC 111 (H) 04/10/2019 1016   Hepatic Function Panel     Component Value Date/Time   PROT 6.8 04/10/2019 1016   ALBUMIN 4.3 04/10/2019 1016   AST 23 05/07/2019   ALT 27 05/07/2019   ALKPHOS 55 04/10/2019 1016   BILITOT 0.4 04/10/2019 1016      Component Value Date/Time   TSH 1.340 04/10/2019 1016     Ref. Range 04/10/2019 10:16  Vitamin D, 25-Hydroxy Latest Ref Range: 30.0 - 100.0 ng/mL 37.8    OBESITY BEHAVIORAL INTERVENTION VISIT  Today's visit was # 10   Starting weight: 308 lbs Starting date: 04/10/2019 Today's weight : 271 lbs Today's date: 08/20/2019 Total lbs lost to date: 37    08/20/2019  Height 5\' 9"  (1.753 m)  Weight 271 lb (122.9 kg)  BMI (Calculated) 40  BLOOD PRESSURE - SYSTOLIC [id number]  BLOOD PRESSURE - DIASTOLIC 78   Body Fat % 123XX123 %  Total Body Water (lbs) 122.4 lbs    ASK: We discussed the diagnosis of obesity with Janalyn Shy today and [name] agreed to give [place] permission to discuss obesity behavioral modification therapy today.  ASSESS: Jammey has the diagnosis of obesity and his BMI today is 108 Peyson is in the action stage of change   ADVISE: Sampson was educated on the multiple health risks of obesity as well as the benefit of weight loss to improve his health. He was advised of the need for long term treatment and the importance of lifestyle modifications to improve his current health and to decrease his risk of future health problems.  AGREE: Multiple dietary modification options and treatment options were discussed and  Adden agreed to follow the recommendations documented in the above note.  ARRANGE:  Mckoy was educated on the importance of frequent visits to treat obesity as outlined per CMS and  USPSTF guidelines and agreed to schedule his next follow up appointment today.  I, Doreene Nest, am acting as transcriptionist for Dennard Nip, MD  I have reviewed the above documentation for accuracy and completeness, and I agree with the above. -Dennard Nip, MD

## 2019-08-30 ENCOUNTER — Ambulatory Visit (INDEPENDENT_AMBULATORY_CARE_PROVIDER_SITE_OTHER): Payer: 59 | Admitting: Psychology

## 2019-08-30 DIAGNOSIS — F4321 Adjustment disorder with depressed mood: Secondary | ICD-10-CM | POA: Diagnosis not present

## 2019-08-31 ENCOUNTER — Other Ambulatory Visit (INDEPENDENT_AMBULATORY_CARE_PROVIDER_SITE_OTHER): Payer: Self-pay | Admitting: Family Medicine

## 2019-08-31 DIAGNOSIS — I10 Essential (primary) hypertension: Secondary | ICD-10-CM

## 2019-09-03 ENCOUNTER — Other Ambulatory Visit: Payer: Self-pay

## 2019-09-03 ENCOUNTER — Ambulatory Visit (INDEPENDENT_AMBULATORY_CARE_PROVIDER_SITE_OTHER): Payer: 59 | Admitting: Physician Assistant

## 2019-09-03 VITALS — BP 138/88 | HR 68 | Temp 97.8°F | Ht 69.0 in | Wt 272.0 lb

## 2019-09-03 DIAGNOSIS — E7849 Other hyperlipidemia: Secondary | ICD-10-CM | POA: Diagnosis not present

## 2019-09-03 DIAGNOSIS — R7303 Prediabetes: Secondary | ICD-10-CM

## 2019-09-03 DIAGNOSIS — E559 Vitamin D deficiency, unspecified: Secondary | ICD-10-CM | POA: Diagnosis not present

## 2019-09-03 DIAGNOSIS — Z9189 Other specified personal risk factors, not elsewhere classified: Secondary | ICD-10-CM

## 2019-09-03 DIAGNOSIS — Z6841 Body Mass Index (BMI) 40.0 and over, adult: Secondary | ICD-10-CM

## 2019-09-04 LAB — COMPREHENSIVE METABOLIC PANEL
ALT: 32 IU/L (ref 0–44)
AST: 22 IU/L (ref 0–40)
Albumin/Globulin Ratio: 1.7 (ref 1.2–2.2)
Albumin: 4.5 g/dL (ref 3.8–4.9)
Alkaline Phosphatase: 64 IU/L (ref 39–117)
BUN/Creatinine Ratio: 15 (ref 9–20)
BUN: 17 mg/dL (ref 6–24)
Bilirubin Total: 0.3 mg/dL (ref 0.0–1.2)
CO2: 25 mmol/L (ref 20–29)
Calcium: 9.7 mg/dL (ref 8.7–10.2)
Chloride: 104 mmol/L (ref 96–106)
Creatinine, Ser: 1.12 mg/dL (ref 0.76–1.27)
GFR calc Af Amer: 85 mL/min/{1.73_m2} (ref >59)
GFR calc non Af Amer: 74 mL/min/{1.73_m2} (ref >59)
Globulin, Total: 2.6 g/dL (ref 1.5–4.5)
Glucose: 97 mg/dL (ref 65–99)
Potassium: 3.9 mmol/L (ref 3.5–5.2)
Sodium: 140 mmol/L (ref 134–144)
Total Protein: 7.1 g/dL (ref 6.0–8.5)

## 2019-09-04 LAB — LIPID PANEL WITH LDL/HDL RATIO
Cholesterol, Total: 161 mg/dL (ref 100–199)
HDL: 44 mg/dL (ref >39)
LDL Chol Calc (NIH): 94 mg/dL (ref 0–99)
LDL/HDL Ratio: 2.1 ratio (ref 0.0–3.6)
Triglycerides: 132 mg/dL (ref 0–149)
VLDL Cholesterol Cal: 23 mg/dL (ref 5–40)

## 2019-09-04 LAB — INSULIN, RANDOM: INSULIN: 24.2 u[IU]/mL (ref 2.6–24.9)

## 2019-09-04 LAB — HEMOGLOBIN A1C
Est. average glucose Bld gHb Est-mCnc: 117 mg/dL
Hgb A1c MFr Bld: 5.7 % — ABNORMAL HIGH (ref 4.8–5.6)

## 2019-09-04 LAB — VITAMIN D 25 HYDROXY (VIT D DEFICIENCY, FRACTURES): Vit D, 25-Hydroxy: 79.6 ng/mL (ref 30.0–100.0)

## 2019-09-04 NOTE — Progress Notes (Signed)
Office: [phone]  /  Fax: [phone]   HPI:   Chief Complaint: OBESITY Kenneth Green is here to discuss his progress with his obesity treatment plan. He is on the keep a food journal with 1800 calories and 115 grams of protein daily and the Category 4 plan and is following his eating plan approximately 70 % of the time. He states he is walking 45 minutes 5 times per week. Mickale reports that he went to an event recently got off track. He also had his mother's birthday and he indulged in cake and ice cream. He is ready to get back on track. His weight is 272 lb (123.4 kg) today and has had a weight gain of 1 pound over a period of 2 weeks since his last visit. He has lost 36 lbs since starting treatment with [place].  Vitamin D deficiency Kenneth Green has a diagnosis of vitamin D deficiency. Kenneth Green is currently taking vit D and he denies nausea, vomiting or muscle weakness.  Hyperlipidemia Kenneth Green has hyperlipidemia and he is not on medications. He has been trying to improve his cholesterol levels with intensive lifestyle modification including a low saturated fat diet, exercise and weight loss. He denies any chest pain.  At risk for cardiovascular disease Kenneth Green is at a higher than average risk for cardiovascular disease due to obesity and hyperlipidemia. He currently denies any chest pain.  ASSESSMENT AND PLAN:  Vitamin D deficiency - Plan: VITAMIN D 25 Hydroxy (Vit-D Deficiency, Fractures)  Other hyperlipidemia - Plan: Lipid Panel With LDL/HDL Ratio  Prediabetes - Plan: Comprehensive metabolic panel, Hemoglobin A1c, Insulin, random  At risk for heart disease  Class 3 severe obesity with serious comorbidity and body mass index (BMI) of 40.0 to 44.9 in adult, unspecified obesity type ([place])  PLAN:  Vitamin D Deficiency Kenneth Green was informed that low vitamin D levels contributes to fatigue and are associated with obesity, breast, and colon cancer. Kenneth Green agrees to continue to take  prescription Vit D @50 ,000 IU every week and he will follow up for routine testing of vitamin D, at least 2-3 times per year. He was informed of the risk of over-replacement of vitamin D and agrees to not increase his dose unless he discusses this with [place] first. We will check labs and Kenneth Green agrees to follow up in 2 weeks.  Hyperlipidemia Kenneth Green was informed of the American Heart Association Guidelines emphasizing intensive lifestyle modifications as the first line treatment for hyperlipidemia. We discussed many lifestyle modifications today in depth, and Sarthak will continue to work on decreasing saturated fats such as fatty red meat, butter and many fried foods. He will also increase vegetables and lean protein in his diet and continue to work on exercise and weight loss efforts. We will check labs and Kenneth Green agrees to follow up as directed.  Cardiovascular risk counseling Kenneth Green was given extended (15 minutes) coronary artery disease prevention counseling today. He is 53 y.o. male and has risk factors for heart disease including obesity and hyperlipidemia. We discussed intensive lifestyle modifications today with an emphasis on specific weight loss instructions and strategies. Pt was also informed of the importance of increasing exercise and decreasing saturated fats to help prevent heart disease.  Obesity Kenneth Green is currently in the action stage of change. As such, his goal is to continue with weight loss efforts He has agreed to keep a food journal with 1800 calories and 115 grams of protein daily and follow the Category 4 plan Kenneth Green has been instructed to work  up to a goal of 150 minutes of combined cardio and strengthening exercise per week for weight loss and overall health benefits. We discussed the following Behavioral Modification Strategies today: keeping healthy foods in the home and work on meal planning and easy cooking plans  Kenneth Green has agreed to follow up with our clinic in 2  weeks. He was informed of the importance of frequent follow up visits to maximize his success with intensive lifestyle modifications for his multiple health conditions.  ALLERGIES: Allergies  Allergen Reactions   Iohexol Hives and Rash    Patient needs pre medication per Dr. Zigmund Daniel    MEDICATIONS: Current Outpatient Medications on File Prior to Visit  Medication Sig Dispense Refill   benazepril-hydrochlorthiazide (LOTENSIN HCT) 10-12.5 MG tablet Take 1 tablet by mouth 2 (two) times daily. 60 tablet 0   buPROPion (WELLBUTRIN SR) 200 MG 12 hr tablet Take 1 tablet (200 mg total) by mouth 2 (two) times daily. 60 tablet 0   Cholecalciferol (VITAMIN D PO) Take by mouth. Take 1000 iu daily     fexofenadine (ALLEGRA) 180 MG tablet Take 180 mg by mouth daily.     Flaxseed, Linseed, (FLAX PO) Take 1 capsule by mouth daily.     gabapentin (NEURONTIN) 300 MG capsule Take 300 mg by mouth 3 (three) times daily.     Vitamin D, Ergocalciferol, (DRISDOL) 1.25 MG (50000 UT) CAPS capsule Take 1 capsule (50,000 Units total) by mouth every 7 (seven) days. 4 capsule 0   No current facility-administered medications on file prior to visit.     PAST MEDICAL HISTORY: Past Medical History:  Diagnosis Date   Anxiety    Back pain    Binge eating disorder    Dyspnea    Glaucoma    Hypertension    Lower extremity edema    Major depressive disorder, recurrent, unspecified ([place])    Mixed hyperlipidemia    Obesity    OSA (obstructive sleep apnea)    Prediabetes    Slow transit constipation     PAST SURGICAL HISTORY: Past Surgical History:  Procedure Laterality Date   SPINAL FUSION      SOCIAL HISTORY: Social History   Tobacco Use   Smoking status: Former Smoker   Smokeless tobacco: Never Used  Substance Use Topics   Alcohol use: Yes    Comment: 2-3 beers weekly   Drug use: No    FAMILY HISTORY: Family History  Problem Relation Age of Onset   Heart disease  Mother    Hypertension Mother    Kidney disease Mother    Obesity Mother    Stroke Father    Hypertension Father    Heart disease Father    Sleep apnea Father    Obesity Father    Heart disease Sister    Diabetes Sister     ROS: Review of Systems  Constitutional: Negative for weight loss.  Cardiovascular: Negative for chest pain.  Gastrointestinal: Negative for nausea and vomiting.  Musculoskeletal:       Negative for muscle weakness    PHYSICAL EXAM: Blood pressure 138/88, pulse 68, temperature 97.8 F (36.6 C), temperature source Oral, height 5\' 9"  (1.753 m), weight 272 lb (123.4 kg), SpO2 98 %. Body mass index is 40.17 kg/m. Physical Exam Vitals signs reviewed.  Constitutional:      Appearance: Normal appearance. He is well-developed. He is obese.  Cardiovascular:     Rate and Rhythm: Normal rate.  Pulmonary:     Effort: Pulmonary  effort is normal.  Musculoskeletal: Normal range of motion.  Skin:    General: Skin is warm and dry.  Neurological:     Mental Status: He is alert and oriented to person, place, and time.  Psychiatric:        Mood and Affect: Mood normal.        Behavior: Behavior normal.     RECENT LABS AND TESTS: BMET    Component Value Date/Time   NA 140 09/03/2019 0905   K 3.9 09/03/2019 0905   CL 104 09/03/2019 0905   CO2 25 09/03/2019 0905   GLUCOSE 97 09/03/2019 0905   GLUCOSE 106 (H) 06/15/2016 1700   BUN 17 09/03/2019 0905   CREATININE 1.12 09/03/2019 0905   CALCIUM 9.7 09/03/2019 0905   GFRNONAA 74 09/03/2019 0905   GFRAA 85 09/03/2019 0905   Lab Results  Component Value Date   HGBA1C 5.7 (H) 09/03/2019   HGBA1C 5.7 05/07/2019   HGBA1C 5.9 (H) 04/10/2019   Lab Results  Component Value Date   INSULIN 24.2 09/03/2019   INSULIN 30.9 (H) 04/10/2019   CBC    Component Value Date/Time   WBC 6.0 04/10/2019 1016   WBC 7.7 06/15/2016 1700   RBC 4.77 04/10/2019 1016   RBC 4.87 06/15/2016 1700   HGB 13.4 04/10/2019  1016   HCT 39.2 04/10/2019 1016   PLT 235 06/15/2016 1700   MCV 82 04/10/2019 1016   MCH 28.1 04/10/2019 1016   MCH 29.0 06/15/2016 1700   MCHC 34.2 04/10/2019 1016   MCHC 34.6 06/15/2016 1700   RDW 13.2 04/10/2019 1016   LYMPHSABS 1.5 04/10/2019 1016   MONOABS 0.7 06/15/2016 1700   EOSABS 0.5 (H) 04/10/2019 1016   BASOSABS 0.1 04/10/2019 1016   Iron/TIBC/Ferritin/ %Sat No results found for: IRON, TIBC, FERRITIN, IRONPCTSAT Lipid Panel     Component Value Date/Time   CHOL 161 09/03/2019 0905   TRIG 132 09/03/2019 0905   HDL 44 09/03/2019 0905   LDLCALC 94 09/03/2019 0905   Hepatic Function Panel     Component Value Date/Time   PROT 7.1 09/03/2019 0905   ALBUMIN 4.5 09/03/2019 0905   AST 22 09/03/2019 0905   ALT 32 09/03/2019 0905   ALKPHOS 64 09/03/2019 0905   BILITOT 0.3 09/03/2019 0905      Component Value Date/Time   TSH 1.340 04/10/2019 1016     Ref. Range 09/03/2019 09:05  Vitamin D, 25-Hydroxy Latest Ref Range: 30.0 - 100.0 ng/mL 79.6    OBESITY BEHAVIORAL INTERVENTION VISIT  Today's visit was # 11   Starting weight: 308 lbs Starting date: 04/10/2019 Today's weight : 272 lbs  Today's date: 09/03/2019 Total lbs lost to date: 36    09/03/2019  Height 5\' 9"  (1.753 m)  Weight 272 lb (123.4 kg)  BMI (Calculated) 40.15  BLOOD PRESSURE - SYSTOLIC 0000000  BLOOD PRESSURE - DIASTOLIC 88   Body Fat % [id number] %  Total Body Water (lbs) 123.2 lbs    ASK: We discussed the diagnosis of obesity with Janalyn Shy today and [name] agreed to give [place] permission to discuss obesity behavioral modification therapy today.  ASSESS: Stavros has the diagnosis of obesity and his BMI today is 40.15 Stace is in the action stage of change   ADVISE: Trevoris was educated on the multiple health risks of obesity as well as the benefit of weight loss to improve his health. He was advised of the need for long term treatment and the importance  of lifestyle modifications to  improve his current health and to decrease his risk of future health problems.  AGREE: Multiple dietary modification options and treatment options were discussed and  Everest agreed to follow the recommendations documented in the above note.  ARRANGE: Drayk was educated on the importance of frequent visits to treat obesity as outlined per CMS and USPSTF guidelines and agreed to schedule his next follow up appointment today.  Corey Skains, am acting as transcriptionist for Abby Potash, PA-C I, Abby Potash, PA-C have reviewed above note and agree with its content

## 2019-09-06 ENCOUNTER — Telehealth (INDEPENDENT_AMBULATORY_CARE_PROVIDER_SITE_OTHER): Payer: Self-pay

## 2019-09-06 NOTE — Telephone Encounter (Signed)
Spoke with the patient and informed him to discontinue the prescription strength Vit D and to start Vit D 1000 units OTC daily per Linus Orn. Patient verbalized understanding.   April, [place]

## 2019-09-21 ENCOUNTER — Ambulatory Visit (INDEPENDENT_AMBULATORY_CARE_PROVIDER_SITE_OTHER): Payer: 59 | Admitting: Psychology

## 2019-09-21 DIAGNOSIS — F4321 Adjustment disorder with depressed mood: Secondary | ICD-10-CM | POA: Diagnosis not present

## 2019-09-24 ENCOUNTER — Encounter (INDEPENDENT_AMBULATORY_CARE_PROVIDER_SITE_OTHER): Payer: Self-pay | Admitting: Family Medicine

## 2019-09-24 ENCOUNTER — Other Ambulatory Visit: Payer: Self-pay

## 2019-09-24 ENCOUNTER — Ambulatory Visit (INDEPENDENT_AMBULATORY_CARE_PROVIDER_SITE_OTHER): Payer: 59 | Admitting: Family Medicine

## 2019-09-24 VITALS — BP 133/82 | HR 67 | Temp 97.7°F | Ht 69.0 in | Wt 274.0 lb

## 2019-09-24 DIAGNOSIS — Z9189 Other specified personal risk factors, not elsewhere classified: Secondary | ICD-10-CM | POA: Diagnosis not present

## 2019-09-24 DIAGNOSIS — F3289 Other specified depressive episodes: Secondary | ICD-10-CM | POA: Diagnosis not present

## 2019-09-24 DIAGNOSIS — E559 Vitamin D deficiency, unspecified: Secondary | ICD-10-CM

## 2019-09-24 DIAGNOSIS — R7303 Prediabetes: Secondary | ICD-10-CM | POA: Diagnosis not present

## 2019-09-24 DIAGNOSIS — Z6841 Body Mass Index (BMI) 40.0 and over, adult: Secondary | ICD-10-CM

## 2019-09-24 MED ORDER — BUPROPION HCL ER (SR) 200 MG PO TB12: 200.0000 mg | ORAL_TABLET | Freq: Two times a day (BID) | ORAL | 0 refills | Status: DC

## 2019-09-24 NOTE — Progress Notes (Signed)
Office: [phone]  /  Fax: [phone]   HPI:   Chief Complaint: OBESITY Kenneth Green is here to discuss his progress with his obesity treatment plan. He is on the Category 4 plan and is following his eating plan approximately 65 % of the time. He states he is walking 45 minutes 5 times per week. Kenneth Green had been doing well on his Category 4 plan. He got off track after his wife's surgery, and he has gained some weight, but he is ready to get back on track. His weight is 274 lb (124.3 kg) today and has had a weight gain of 2 pounds over a period of 3 weeks since his last visit. He has lost 34 lbs since starting treatment with [place].  Pre-Diabetes Kenneth Green has a diagnosis of prediabetes based on his elevated Hgb A1c and was informed this puts him at greater risk of developing diabetes. His last A1c was at 5.7 and his fasting insulin has improved with diet and weight loss. Kenneth Green is not taking metformin currently and he continues to work on diet and exercise to decrease risk of diabetes. He denies nausea or hypoglycemia.  At risk for diabetes Kenneth Green is at higher than average risk for developing diabetes due to his obesity and prediabetes. He currently denies polyuria or polydipsia.  Vitamin D deficiency [name] has a diagnosis of vitamin D deficiency. His vitamin D level is at goal and patient is now off of prescription vitamin D. He denies nausea, vomiting or muscle weakness.  Depression with emotional eating behaviors Kenneth Green's mood is stable on medications. He has decreased emotional eating. Kenneth Green struggles with emotional eating and using food for comfort to the extent that it is negatively impacting his health. He often snacks when he is not hungry. Kenneth Green sometimes feels he is out of control and then feels guilty that he made poor food choices. He has been working on behavior modification techniques to help reduce his emotional eating and has been somewhat successful. His blood pressure is  stable and he denies insomnia. He shows no sign of suicidal or homicidal ideations.  ASSESSMENT AND PLAN:  Prediabetes  Vitamin D deficiency  Other depression - with emotional eating  - Plan: buPROPion (WELLBUTRIN SR) 200 MG 12 hr tablet  At risk for diabetes mellitus  Class 3 severe obesity with serious comorbidity and body mass index (BMI) of 40.0 to 44.9 in adult, unspecified obesity type [facility])  PLAN:  Pre-Diabetes Kenneth Green will continue to work on weight loss, exercise, and decreasing simple carbohydrates in his diet to help decrease the risk of diabetes. He will watch for signs of hypoglycemia. He was informed that eating too many simple carbohydrates or too many calories at one sitting increases the likelihood of GI side effects. Kenneth Green will follow up with [place] as directed to monitor his progress.  Diabetes risk counseling Kenneth Green was given extended (15 minutes) diabetes prevention counseling today. He is 53 y.o. male and has risk factors for diabetes including obesity and prediabetes. We discussed intensive lifestyle modifications today with an emphasis on weight loss as well as increasing exercise and decreasing simple carbohydrates in his diet.  Vitamin D Deficiency Kenneth Green was informed that low vitamin D levels contributes to fatigue and are associated with obesity, breast, and colon cancer. Kenneth Green will continue OCT vitamin D @1 ,000 IU daily and he will follow up for routine testing of vitamin D, at least 2-3 times per year. He was informed of the risk of over-replacement of vitamin D and  agrees to not increase his dose unless he discusses this with [place] first.  Depression with Emotional Eating Behaviors We discussed behavior modification techniques today to help Kenneth Green deal with his emotional eating and depression. He has agreed to continue Wellbutrin SR 200 mg BID #60 with no refills and follow up as directed.  Obesity Kenneth Green is currently in the action stage of change. As  such, his goal is to continue with weight loss efforts He has agreed to follow the Category 4 plan [name] has been instructed to work up to a goal of 150 minutes of combined cardio and strengthening exercise per week for weight loss and overall health benefits. We discussed the following Behavioral Modification Strategies today: increasing lean protein intake, decreasing simple carbohydrates  and work on meal planning and easy cooking plans  Low sugar fruit choices and microwave meals were discussed and handout was given to patient.  Kenneth Green has agreed to follow up with our clinic in 2 to 3 weeks. He was informed of the importance of frequent follow up visits to maximize his success with intensive lifestyle modifications for his multiple health conditions.  ALLERGIES: Allergies  Allergen Reactions   Iohexol Hives and Rash    Patient needs pre medication per Dr. Zigmund Daniel    MEDICATIONS: Current Outpatient Medications on File Prior to Visit  Medication Sig Dispense Refill   benazepril-hydrochlorthiazide (LOTENSIN HCT) 10-12.5 MG tablet Take 1 tablet by mouth 2 (two) times daily. (Patient taking differently: Take 1 tablet by mouth daily. ) 60 tablet 0   Cholecalciferol (VITAMIN D PO) Take by mouth. Take 1000 iu daily     fexofenadine (ALLEGRA) 180 MG tablet Take 180 mg by mouth daily.     Flaxseed, Linseed, (FLAX PO) Take 1 capsule by mouth daily.     gabapentin (NEURONTIN) 300 MG capsule Take 300 mg by mouth 3 (three) times daily.     No current facility-administered medications on file prior to visit.     PAST MEDICAL HISTORY: Past Medical History:  Diagnosis Date   Anxiety    Back pain    Binge eating disorder    Dyspnea    Glaucoma    Hypertension    Lower extremity edema    Major depressive disorder, recurrent, unspecified ([place])    Mixed hyperlipidemia    Obesity    OSA (obstructive sleep apnea)    Prediabetes    Slow transit constipation     PAST  SURGICAL HISTORY: Past Surgical History:  Procedure Laterality Date   SPINAL FUSION      SOCIAL HISTORY: Social History   Tobacco Use   Smoking status: Former Smoker   Smokeless tobacco: Never Used  Substance Use Topics   Alcohol use: Yes    Comment: 2-3 beers weekly   Drug use: No    FAMILY HISTORY: Family History  Problem Relation Age of Onset   Heart disease Mother    Hypertension Mother    Kidney disease Mother    Obesity Mother    Stroke Father    Hypertension Father    Heart disease Father    Sleep apnea Father    Obesity Father    Heart disease Sister    Diabetes Sister     ROS: Review of Systems  Constitutional: Negative for weight loss.  Gastrointestinal: Negative for nausea and vomiting.  Genitourinary: Negative for frequency.  Musculoskeletal:       Negative for muscle weakness  Endo/Heme/Allergies: Negative for polydipsia.  Negative for hypoglycemia  Psychiatric/Behavioral: Positive for depression. Negative for suicidal ideas. The patient does not have insomnia.     PHYSICAL EXAM: Blood pressure 133/82, pulse 67, temperature 97.7 F (36.5 C), temperature source Oral, height 5\' 9"  (1.753 m), weight 274 lb (124.3 kg), SpO2 99 %. Body mass index is 40.46 kg/m. Physical Exam Vitals signs reviewed.  Constitutional:      Appearance: Normal appearance. He is well-developed. He is obese.  Cardiovascular:     Rate and Rhythm: Normal rate.  Pulmonary:     Effort: Pulmonary effort is normal.  Musculoskeletal: Normal range of motion.  Skin:    General: Skin is warm and dry.  Neurological:     Mental Status: He is alert and oriented to person, place, and time.  Psychiatric:        Mood and Affect: Mood normal.        Behavior: Behavior normal.        Thought Content: Thought content does not include homicidal or suicidal ideation.     RECENT LABS AND TESTS: BMET    Component Value Date/Time   NA 140 09/03/2019 0905   K  3.9 09/03/2019 0905   CL 104 09/03/2019 0905   CO2 25 09/03/2019 0905   GLUCOSE 97 09/03/2019 0905   GLUCOSE 106 (H) 06/15/2016 1700   BUN 17 09/03/2019 0905   CREATININE 1.12 09/03/2019 0905   CALCIUM 9.7 09/03/2019 0905   GFRNONAA 74 09/03/2019 0905   GFRAA 85 09/03/2019 0905   Lab Results  Component Value Date   HGBA1C 5.7 (H) 09/03/2019   HGBA1C 5.7 05/07/2019   HGBA1C 5.9 (H) 04/10/2019   Lab Results  Component Value Date   INSULIN 24.2 09/03/2019   INSULIN 30.9 (H) 04/10/2019   CBC    Component Value Date/Time   WBC 6.0 04/10/2019 1016   WBC 7.7 06/15/2016 1700   RBC 4.77 04/10/2019 1016   RBC 4.87 06/15/2016 1700   HGB 13.4 04/10/2019 1016   HCT 39.2 04/10/2019 1016   PLT 235 06/15/2016 1700   MCV 82 04/10/2019 1016   MCH 28.1 04/10/2019 1016   MCH 29.0 06/15/2016 1700   MCHC 34.2 04/10/2019 1016   MCHC 34.6 06/15/2016 1700   RDW 13.2 04/10/2019 1016   LYMPHSABS 1.5 04/10/2019 1016   MONOABS 0.7 06/15/2016 1700   EOSABS 0.5 (H) 04/10/2019 1016   BASOSABS 0.1 04/10/2019 1016   Iron/TIBC/Ferritin/ %Sat No results found for: IRON, TIBC, FERRITIN, IRONPCTSAT Lipid Panel     Component Value Date/Time   CHOL 161 09/03/2019 0905   TRIG 132 09/03/2019 0905   HDL 44 09/03/2019 0905   LDLCALC 94 09/03/2019 0905   Hepatic Function Panel     Component Value Date/Time   PROT 7.1 09/03/2019 0905   ALBUMIN 4.5 09/03/2019 0905   AST 22 09/03/2019 0905   ALT 32 09/03/2019 0905   ALKPHOS 64 09/03/2019 0905   BILITOT 0.3 09/03/2019 0905      Component Value Date/Time   TSH 1.340 04/10/2019 1016     Ref. Range 09/03/2019 09:05  Vitamin D, 25-Hydroxy Latest Ref Range: 30.0 - 100.0 ng/mL 79.6    OBESITY BEHAVIORAL INTERVENTION VISIT  Today's visit was # 12   Starting weight: 308 lbs Starting date: 04/10/2019 Today's weight : 274 lbs Today's date: 09/24/2019 Total lbs lost to date: 34    09/24/2019  Height 5\' 9"  (1.753 m)  Weight 274 lb (124.3 kg)    BMI (Calculated) 40.44  BLOOD PRESSURE - SYSTOLIC [id number]  BLOOD PRESSURE - DIASTOLIC 82   Body Fat % [id number] %  Total Body Water (lbs) 122.4 lbs    ASK: We discussed the diagnosis of obesity with Janalyn Shy today and [name] agreed to give [place] permission to discuss obesity behavioral modification therapy today.  ASSESS: Melchor has the diagnosis of obesity and his BMI today is 40.44 Danen is in the action stage of change   ADVISE: Syire was educated on the multiple health risks of obesity as well as the benefit of weight loss to improve his health. He was advised of the need for long term treatment and the importance of lifestyle modifications to improve his current health and to decrease his risk of future health problems.  AGREE: Multiple dietary modification options and treatment options were discussed and  Aryav agreed to follow the recommendations documented in the above note.  ARRANGE: Cailean was educated on the importance of frequent visits to treat obesity as outlined per CMS and USPSTF guidelines and agreed to schedule his next follow up appointment today.  I, Doreene Nest, am acting as transcriptionist for Dennard Nip, MD  I have reviewed the above documentation for accuracy and completeness, and I agree with the above. -Dennard Nip, MD

## 2019-10-04 ENCOUNTER — Encounter (INDEPENDENT_AMBULATORY_CARE_PROVIDER_SITE_OTHER): Payer: Self-pay

## 2019-10-08 ENCOUNTER — Encounter (INDEPENDENT_AMBULATORY_CARE_PROVIDER_SITE_OTHER): Payer: Self-pay | Admitting: Family Medicine

## 2019-10-08 ENCOUNTER — Ambulatory Visit (INDEPENDENT_AMBULATORY_CARE_PROVIDER_SITE_OTHER): Payer: 59 | Admitting: Family Medicine

## 2019-10-08 ENCOUNTER — Other Ambulatory Visit: Payer: Self-pay

## 2019-10-08 VITALS — BP 126/81 | HR 71 | Temp 97.5°F | Ht 69.0 in | Wt 273.0 lb

## 2019-10-08 DIAGNOSIS — Z6841 Body Mass Index (BMI) 40.0 and over, adult: Secondary | ICD-10-CM

## 2019-10-08 DIAGNOSIS — I10 Essential (primary) hypertension: Secondary | ICD-10-CM | POA: Diagnosis not present

## 2019-10-09 NOTE — Progress Notes (Signed)
Office: [phone]  /  Fax: [phone]   HPI:   Chief Complaint: OBESITY Kenneth Green is here to discuss his progress with his obesity treatment plan. He is on the Category 4 plan and is following his eating plan approximately 80 % of the time. He states he is walking for 45 minutes 5 times per week. Kenneth Green continues to do well with weight loss. He was involved with a civil war reenactment, and he had extra snacks left over which he brought home and regrets but otherwise he did well. He has questions about Thanksgiving eating strategies.  His weight is 273 lb (123.8 kg) today and has had a weight loss of 1 pound over a period of 2 weeks since his last visit. He has lost 35 lbs since starting treatment with [place].  Hypertension Kenneth Green is a 53 y.o. male with hypertension. Kenneth Green's blood pressure continues to do well with weight loss. His blood pressure continues to improve and he is feeling well. He denies chest pain or dizziness. He is working on weight loss to help control his blood pressure with the goal of decreasing his risk of heart attack and stroke.   ASSESSMENT AND PLAN:  Essential hypertension  Class 3 severe obesity with serious comorbidity and body mass index (BMI) of 40.0 to 44.9 in adult, unspecified obesity type ([place])  PLAN:  Hypertension We discussed sodium restriction, working on healthy weight loss, and a regular exercise program as the means to achieve improved blood pressure control. Kenneth Green agreed with this plan and agreed to follow up as directed. We will continue to monitor his blood pressure as well as his progress with the above lifestyle modifications. Kenneth Green agrees to continue his medications, diet, and exercise. He will watch for signs of hypotension as he continues his lifestyle modifications. Kenneth Green agrees to follow up with our clinic in 3 weeks.  I spent > than 50% of the 18 minute visit on counseling as documented in the note.  Obesity Kenneth Green is  currently in the action stage of change. As such, his goal is to continue with weight loss efforts He has agreed to follow the Category 4 plan Kenneth Green has been instructed to work up to a goal of 150 minutes of combined cardio and strengthening exercise per week for weight loss and overall health benefits. We discussed the following Behavioral Modification Strategies today: increasing lean protein intake   Kenneth Green has agreed to follow up with our clinic in 3 weeks. He was informed of the importance of frequent follow up visits to maximize his success with intensive lifestyle modifications for his multiple health conditions.  ALLERGIES: Allergies  Allergen Reactions  . Iohexol Hives and Rash    Patient needs pre medication per Dr. Zigmund Daniel    MEDICATIONS: Current Outpatient Medications on File Prior to Visit  Medication Sig Dispense Refill  . benazepril-hydrochlorthiazide (LOTENSIN HCT) 10-12.5 MG tablet Take 1 tablet by mouth 2 (two) times daily. (Patient taking differently: Take 1 tablet by mouth daily. ) 60 tablet 0  . buPROPion (WELLBUTRIN SR) 200 MG 12 hr tablet Take 1 tablet (200 mg total) by mouth 2 (two) times daily. 60 tablet 0  . Cholecalciferol (VITAMIN D PO) Take by mouth. Take 1000 iu daily    . fexofenadine (ALLEGRA) 180 MG tablet Take 180 mg by mouth daily.    . Flaxseed, Linseed, (FLAX PO) Take 1 capsule by mouth daily.    Marland Kitchen gabapentin (NEURONTIN) 300 MG capsule Take 300 mg by mouth  3 (three) times daily.     No current facility-administered medications on file prior to visit.     PAST MEDICAL HISTORY: Past Medical History:  Diagnosis Date  . Anxiety   . Back pain   . Binge eating disorder   . Dyspnea   . Glaucoma   . Hypertension   . Lower extremity edema   . Major depressive disorder, recurrent, unspecified ([place])   . Mixed hyperlipidemia   . Obesity   . OSA (obstructive sleep apnea)   . Prediabetes   . Slow transit constipation     PAST SURGICAL HISTORY:  Past Surgical History:  Procedure Laterality Date  . SPINAL FUSION      SOCIAL HISTORY: Social History   Tobacco Use  . Smoking status: Former Research scientist (life sciences)  . Smokeless tobacco: Never Used  Substance Use Topics  . Alcohol use: Yes    Comment: 2-3 beers weekly  . Drug use: No    FAMILY HISTORY: Family History  Problem Relation Age of Onset  . Heart disease Mother   . Hypertension Mother   . Kidney disease Mother   . Obesity Mother   . Stroke Father   . Hypertension Father   . Heart disease Father   . Sleep apnea Father   . Obesity Father   . Heart disease Sister   . Diabetes Sister     ROS: Review of Systems  Constitutional: Positive for weight loss.  Cardiovascular: Negative for chest pain.  Neurological: Negative for dizziness.    PHYSICAL EXAM: Blood pressure 126/81, pulse 71, temperature (!) 97.5 F (36.4 C), temperature source Oral, height 5\' 9"  (1.753 m), weight 273 lb (123.8 kg), SpO2 99 %. Body mass index is 40.32 kg/m. Physical Exam Vitals signs reviewed.  Constitutional:      Appearance: Normal appearance. He is obese.  Cardiovascular:     Rate and Rhythm: Normal rate.     Pulses: Normal pulses.  Pulmonary:     Effort: Pulmonary effort is normal.     Breath sounds: Normal breath sounds.  Musculoskeletal: Normal range of motion.  Skin:    General: Skin is warm and dry.  Neurological:     Mental Status: He is alert and oriented to person, place, and time.  Psychiatric:        Mood and Affect: Mood normal.        Behavior: Behavior normal.     RECENT LABS AND TESTS: BMET    Component Value Date/Time   NA 140 09/03/2019 0905   K 3.9 09/03/2019 0905   CL 104 09/03/2019 0905   CO2 25 09/03/2019 0905   GLUCOSE 97 09/03/2019 0905   GLUCOSE 106 (H) 06/15/2016 1700   BUN 17 09/03/2019 0905   CREATININE 1.12 09/03/2019 0905   CALCIUM 9.7 09/03/2019 0905   GFRNONAA 74 09/03/2019 0905   GFRAA 85 09/03/2019 0905   Lab Results  Component Value  Date   HGBA1C 5.7 (H) 09/03/2019   HGBA1C 5.7 05/07/2019   HGBA1C 5.9 (H) 04/10/2019   Lab Results  Component Value Date   INSULIN 24.2 09/03/2019   INSULIN 30.9 (H) 04/10/2019   CBC    Component Value Date/Time   WBC 6.0 04/10/2019 1016   WBC 7.7 06/15/2016 1700   RBC 4.77 04/10/2019 1016   RBC 4.87 06/15/2016 1700   HGB 13.4 04/10/2019 1016   HCT 39.2 04/10/2019 1016   PLT 235 06/15/2016 1700   MCV 82 04/10/2019 1016   MCH 28.1  04/10/2019 1016   MCH 29.0 06/15/2016 1700   MCHC 34.2 04/10/2019 1016   MCHC 34.6 06/15/2016 1700   RDW 13.2 04/10/2019 1016   LYMPHSABS 1.5 04/10/2019 1016   MONOABS 0.7 06/15/2016 1700   EOSABS 0.5 (H) 04/10/2019 1016   BASOSABS 0.1 04/10/2019 1016   Iron/TIBC/Ferritin/ %Sat No results found for: IRON, TIBC, FERRITIN, IRONPCTSAT Lipid Panel     Component Value Date/Time   CHOL 161 09/03/2019 0905   TRIG 132 09/03/2019 0905   HDL 44 09/03/2019 0905   LDLCALC 94 09/03/2019 0905   Hepatic Function Panel     Component Value Date/Time   PROT 7.1 09/03/2019 0905   ALBUMIN 4.5 09/03/2019 0905   AST 22 09/03/2019 0905   ALT 32 09/03/2019 0905   ALKPHOS 64 09/03/2019 0905   BILITOT 0.3 09/03/2019 0905      Component Value Date/Time   TSH 1.340 04/10/2019 1016      OBESITY BEHAVIORAL INTERVENTION VISIT  Today's visit was # 13   Starting weight: 308 lbs Starting date: 04/10/2019 Today's weight : 273 lbs Today's date: 10/08/2019 Total lbs lost to date: 35    ASK: We discussed the diagnosis of obesity with Kenneth Green today and Kenneth Green agreed to give [place] permission to discuss obesity behavioral modification therapy today.  ASSESS: Kenneth Green has the diagnosis of obesity and his BMI today is 40.3 Kenneth Green is in the action stage of change   ADVISE: Kenneth Green was educated on the multiple health risks of obesity as well as the benefit of weight loss to improve his health. He was advised of the need for long term treatment and the  importance of lifestyle modifications to improve his current health and to decrease his risk of future health problems.  AGREE: Multiple dietary modification options and treatment options were discussed and  Kenneth Green agreed to follow the recommendations documented in the above note.  ARRANGE: Kenneth Green was educated on the importance of frequent visits to treat obesity as outlined per CMS and USPSTF guidelines and agreed to schedule his next follow up appointment today.  I, Trixie Dredge, am acting as transcriptionist for Dennard Nip, MD I have reviewed the above documentation for accuracy and completeness, and I agree with the above. -Dennard Nip, MD

## 2019-10-11 ENCOUNTER — Ambulatory Visit (INDEPENDENT_AMBULATORY_CARE_PROVIDER_SITE_OTHER): Payer: 59 | Admitting: Psychology

## 2019-10-11 DIAGNOSIS — F4321 Adjustment disorder with depressed mood: Secondary | ICD-10-CM | POA: Diagnosis not present

## 2019-10-29 ENCOUNTER — Other Ambulatory Visit: Payer: Self-pay

## 2019-10-29 ENCOUNTER — Ambulatory Visit (INDEPENDENT_AMBULATORY_CARE_PROVIDER_SITE_OTHER): Payer: 59 | Admitting: Psychology

## 2019-10-29 ENCOUNTER — Ambulatory Visit (INDEPENDENT_AMBULATORY_CARE_PROVIDER_SITE_OTHER): Payer: 59 | Admitting: Family Medicine

## 2019-10-29 ENCOUNTER — Encounter (INDEPENDENT_AMBULATORY_CARE_PROVIDER_SITE_OTHER): Payer: Self-pay | Admitting: Family Medicine

## 2019-10-29 VITALS — BP 137/81 | HR 71 | Temp 97.7°F | Ht 69.0 in | Wt 269.0 lb

## 2019-10-29 DIAGNOSIS — Z9189 Other specified personal risk factors, not elsewhere classified: Secondary | ICD-10-CM | POA: Diagnosis not present

## 2019-10-29 DIAGNOSIS — G629 Polyneuropathy, unspecified: Secondary | ICD-10-CM

## 2019-10-29 DIAGNOSIS — F3289 Other specified depressive episodes: Secondary | ICD-10-CM

## 2019-10-29 DIAGNOSIS — F4321 Adjustment disorder with depressed mood: Secondary | ICD-10-CM | POA: Diagnosis not present

## 2019-10-29 DIAGNOSIS — Z6839 Body mass index (BMI) 39.0-39.9, adult: Secondary | ICD-10-CM

## 2019-10-29 DIAGNOSIS — E66812 Obesity, class 2: Secondary | ICD-10-CM

## 2019-10-29 MED ORDER — BUPROPION HCL ER (SR) 200 MG PO TB12: 200.0000 mg | ORAL_TABLET | Freq: Two times a day (BID) | ORAL | 0 refills | Status: DC

## 2019-10-30 NOTE — Progress Notes (Signed)
Office: [phone]  /  Fax: [phone]   HPI:   Chief Complaint: OBESITY Kenneth Green is here to discuss his progress with his obesity treatment plan. He is on the Category 4 plan and is following his eating plan approximately 85% of the time. He states he is walking 45 minutes 5 times per week. Kenneth Green continues to do very well with weight loss even over Thanksgiving. Hunger is controlled. His wife is journaling for dinner and he would like this option as well. His weight is 269 lb (122 kg) today and has had a weight loss of 4 pounds over a period of 3 weeks since his last visit. He has lost 39 lbs since starting treatment with [place].  Depression with emotional eating behaviors Kenneth Green is struggling with emotional eating and using food for comfort to the extent that it is negatively impacting his health. He often snacks when he is not hungry. Kenneth Green sometimes feels he is out of control and then feels guilty that he made poor food choices. He has been working on behavior modification techniques to help reduce his emotional eating and has been somewhat successful. Kenneth Green is doing well with decreased emotional eating on Wellbutrin. He is sleeping well and blood pressure is stable. He shows no sign of suicidal or homicidal ideations.  Depression screen PHQ 2/9 04/10/2019  Decreased Interest 3  Down, Depressed, Hopeless 2  PHQ - 2 Score 5  Altered sleeping 1  Tired, decreased energy 3  Change in appetite 3  Feeling bad or failure about yourself  2  Trouble concentrating 1  Moving slowly or fidgety/restless 3  Suicidal thoughts 0  PHQ-9 Score 18  Difficult doing work/chores Somewhat difficult   Neuropathy Kenneth Green has been on Gabapentin for neuropathy, but ran out approximately 2 weeks ago and notes no increase in neuropathic pain. He wants to know if he can stop this.  At risk for cardiovascular disease Kenneth Green is at a higher than average risk for cardiovascular disease due to obesity. He  currently denies any chest pain.  ASSESSMENT AND PLAN:  Neuropathy  Other depression - with emotional eating  - Plan: buPROPion (WELLBUTRIN SR) 200 MG 12 hr tablet  At risk for heart disease  Class 2 severe obesity with serious comorbidity and body mass index (BMI) of 39.0 to 39.9 in adult, unspecified obesity type (Kenneth Green)  PLAN:  Emotional Eating Behaviors (other depression) Behavior modification techniques were discussed today to help Kenneth Green deal with his emotional/non-hunger eating behaviors. Kenneth Green was given a refill on his Wellbutrin 200 mg BID #60 with 0 refills. He agrees to follow-up with our clinic in 3 weeks.  Neuropathy Kenneth Green was informed it was okay to stop gabapentin if it is not yielding benefit, but advised him to discuss with his PCP in 2 weeks.  Cardiovascular risk counseling Kenneth Green was given (~15 minutes) coronary artery disease prevention counseling today. He is 53 y.o. male and has risk factors for heart disease including obesity. We discussed intensive lifestyle modifications today with an emphasis on specific weight loss instructions and strategies.   Obesity Kenneth Green is currently in the action stage of change. As such, his goal is to continue with weight loss efforts. He has agreed to follow the Category 4 plan and journal 400-650 calories and 40+ grams of protein at supper. Kenneth Green has been instructed to work up to a goal of 150 minutes of combined cardio and strengthening exercise per week for weight loss and overall health benefits. We discussed the following  Behavioral Modification Strategies today: increasing lean protein intake, work on meal planning and easy cooking plans, and holiday eating strategies.  Kenneth Green has agreed to follow-up with our clinic in 3 weeks. He was informed of the importance of frequent follow-up visits to maximize his success with intensive lifestyle modifications for his multiple health conditions.  ALLERGIES: Allergies   Allergen Reactions  . Iohexol Hives and Rash    Patient needs pre medication per Dr. Zigmund Daniel    MEDICATIONS: Current Outpatient Medications on File Prior to Visit  Medication Sig Dispense Refill  . benazepril-hydrochlorthiazide (LOTENSIN HCT) 10-12.5 MG tablet Take 1 tablet by mouth 2 (two) times daily. 60 tablet 0  . Cholecalciferol (VITAMIN D PO) Take by mouth. Take 1000 iu daily    . fexofenadine (ALLEGRA) 180 MG tablet Take 180 mg by mouth daily.    . Flaxseed, Linseed, (FLAX PO) Take 1 capsule by mouth daily.    Marland Kitchen gabapentin (NEURONTIN) 300 MG capsule Take 300 mg by mouth 3 (three) times daily.     No current facility-administered medications on file prior to visit.     PAST MEDICAL HISTORY: Past Medical History:  Diagnosis Date  . Anxiety   . Back pain   . Binge eating disorder   . Dyspnea   . Glaucoma   . Hypertension   . Lower extremity edema   . Major depressive disorder, recurrent, unspecified ([place])   . Mixed hyperlipidemia   . Obesity   . OSA (obstructive sleep apnea)   . Prediabetes   . Slow transit constipation     PAST SURGICAL HISTORY: Past Surgical History:  Procedure Laterality Date  . SPINAL FUSION      SOCIAL HISTORY: Social History   Tobacco Use  . Smoking status: Former Research scientist (life sciences)  . Smokeless tobacco: Never Used  Substance Use Topics  . Alcohol use: Yes    Comment: 2-3 beers weekly  . Drug use: No    FAMILY HISTORY: Family History  Problem Relation Age of Onset  . Heart disease Mother   . Hypertension Mother   . Kidney disease Mother   . Obesity Mother   . Stroke Father   . Hypertension Father   . Heart disease Father   . Sleep apnea Father   . Obesity Father   . Heart disease Sister   . Diabetes Sister    ROS: Review of Systems  Neurological:       Positive for neuropathy.  Psychiatric/Behavioral: Positive for depression (emotional eating). Negative for suicidal ideas.       Negative for homicidal ideas.   PHYSICAL EXAM:  Blood pressure 137/81, pulse 71, temperature 97.7 F (36.5 C), temperature source Oral, height 5\' 9"  (1.753 m), weight 269 lb (122 kg), SpO2 98 %. Body mass index is 39.72 kg/m. Physical Exam Vitals signs reviewed.  Constitutional:      Appearance: Normal appearance. He is obese.  Cardiovascular:     Rate and Rhythm: Normal rate.     Pulses: Normal pulses.  Pulmonary:     Effort: Pulmonary effort is normal.     Breath sounds: Normal breath sounds.  Musculoskeletal: Normal range of motion.  Skin:    General: Skin is warm and dry.  Neurological:     Mental Status: He is alert and oriented to person, place, and time.  Psychiatric:        Behavior: Behavior normal.   RECENT LABS AND TESTS: BMET    Component Value Date/Time   NA 140  09/03/2019 0905   K 3.9 09/03/2019 0905   CL 104 09/03/2019 0905   CO2 25 09/03/2019 0905   GLUCOSE 97 09/03/2019 0905   GLUCOSE 106 (H) 06/15/2016 1700   BUN 17 09/03/2019 0905   CREATININE 1.12 09/03/2019 0905   CALCIUM 9.7 09/03/2019 0905   GFRNONAA 74 09/03/2019 0905   GFRAA 85 09/03/2019 0905   Lab Results  Component Value Date   HGBA1C 5.7 (H) 09/03/2019   HGBA1C 5.7 05/07/2019   HGBA1C 5.9 (H) 04/10/2019   Lab Results  Component Value Date   INSULIN 24.2 09/03/2019   INSULIN 30.9 (H) 04/10/2019   CBC    Component Value Date/Time   WBC 6.0 04/10/2019 1016   WBC 7.7 06/15/2016 1700   RBC 4.77 04/10/2019 1016   RBC 4.87 06/15/2016 1700   HGB 13.4 04/10/2019 1016   HCT 39.2 04/10/2019 1016   PLT 235 06/15/2016 1700   MCV 82 04/10/2019 1016   MCH 28.1 04/10/2019 1016   MCH 29.0 06/15/2016 1700   MCHC 34.2 04/10/2019 1016   MCHC 34.6 06/15/2016 1700   RDW 13.2 04/10/2019 1016   LYMPHSABS 1.5 04/10/2019 1016   MONOABS 0.7 06/15/2016 1700   EOSABS 0.5 (H) 04/10/2019 1016   BASOSABS 0.1 04/10/2019 1016   Iron/TIBC/Ferritin/ %Sat No results found for: IRON, TIBC, FERRITIN, IRONPCTSAT Lipid Panel     Component Value  Date/Time   CHOL 161 09/03/2019 0905   TRIG 132 09/03/2019 0905   HDL 44 09/03/2019 0905   LDLCALC 94 09/03/2019 0905   Hepatic Function Panel     Component Value Date/Time   PROT 7.1 09/03/2019 0905   ALBUMIN 4.5 09/03/2019 0905   AST 22 09/03/2019 0905   ALT 32 09/03/2019 0905   ALKPHOS 64 09/03/2019 0905   BILITOT 0.3 09/03/2019 0905      Component Value Date/Time   TSH 1.340 04/10/2019 1016   Results for YANCARLO, BUTRICK (MRN [id number]) as of 10/30/2019 07:25  Ref. Range 09/03/2019 09:05  Vitamin D, 25-Hydroxy Latest Ref Range: 30.0 - 100.0 ng/mL 79.6   OBESITY BEHAVIORAL INTERVENTION VISIT  Today's visit was #14  Starting weight: 308 lbs Starting date: 04/10/2019 Today's weight: 269 lbs  Today's date: 10/29/2019 Total lbs lost to date: 39     10/29/2019  Height 5\' 9"  (1.753 m)  Weight 269 lb (122 kg)  BMI (Calculated) 39.71  BLOOD PRESSURE - SYSTOLIC 0000000  BLOOD PRESSURE - DIASTOLIC 81   Body Fat % [id number] %  Total Body Water (lbs) 121.2 lbs   ASK: We discussed the diagnosis of obesity with Janalyn Shy today and Kenneth Green agreed to give [place] permission to discuss obesity behavioral modification therapy today.  ASSESS: Theseus has the diagnosis of obesity and his BMI today is 39.8. Tito is in the action stage of change.   ADVISE: Miroslav was educated on the multiple health risks of obesity as well as the benefit of weight loss to improve his health. He was advised of the need for long term treatment and the importance of lifestyle modifications to improve his current health and to decrease his risk of future health problems.  AGREE: Multiple dietary modification options and treatment options were discussed and  Laiken agreed to follow the recommendations documented in the above note.  ARRANGE: Jimmylee was educated on the importance of frequent visits to treat obesity as outlined per CMS and USPSTF guidelines and agreed to schedule his next follow up  appointment today.  I,  Michaelene Song, am acting as Location manager for Dennard Nip, MD   I have reviewed the above documentation for accuracy and completeness, and I agree with the above. -Dennard Nip, MD

## 2019-11-21 ENCOUNTER — Other Ambulatory Visit (INDEPENDENT_AMBULATORY_CARE_PROVIDER_SITE_OTHER): Payer: Self-pay | Admitting: Family Medicine

## 2019-11-21 DIAGNOSIS — F3289 Other specified depressive episodes: Secondary | ICD-10-CM

## 2019-11-22 ENCOUNTER — Ambulatory Visit (INDEPENDENT_AMBULATORY_CARE_PROVIDER_SITE_OTHER): Payer: 59 | Admitting: Psychology

## 2019-11-22 DIAGNOSIS — F4321 Adjustment disorder with depressed mood: Secondary | ICD-10-CM

## 2019-11-26 ENCOUNTER — Encounter (INDEPENDENT_AMBULATORY_CARE_PROVIDER_SITE_OTHER): Payer: Self-pay | Admitting: Family Medicine

## 2019-11-26 ENCOUNTER — Other Ambulatory Visit: Payer: Self-pay

## 2019-11-26 ENCOUNTER — Other Ambulatory Visit (INDEPENDENT_AMBULATORY_CARE_PROVIDER_SITE_OTHER): Payer: Self-pay | Admitting: Family Medicine

## 2019-11-26 ENCOUNTER — Ambulatory Visit (INDEPENDENT_AMBULATORY_CARE_PROVIDER_SITE_OTHER): Payer: 59 | Admitting: Family Medicine

## 2019-11-26 VITALS — BP 138/91 | HR 81 | Temp 97.7°F | Ht 69.0 in | Wt 266.0 lb

## 2019-11-26 DIAGNOSIS — I10 Essential (primary) hypertension: Secondary | ICD-10-CM

## 2019-11-26 DIAGNOSIS — F3289 Other specified depressive episodes: Secondary | ICD-10-CM

## 2019-11-26 DIAGNOSIS — Z6839 Body mass index (BMI) 39.0-39.9, adult: Secondary | ICD-10-CM

## 2019-11-27 NOTE — Progress Notes (Signed)
Chief Complaint: OBESITY Kenneth Green is here to discuss his progress with his obesity treatment plan. Tydrick is on the Category 4 Plan and states he is following his eating plan approximately 70% of the time. Primo states he is walking 45 minutes 3 times per week.  Today's visit was #: 15 Starting weight: 308 lbs Starting date: 04/10/2019 Today's weight: 266 lbs Today's date: 11/26/2019 Total lbs lost to date: 42  Total lbs lost since last in-office visit: 3  Subjective:   Interim History: Lavan did well with weight loss even over the holidays. Hunger is controlled and he is doing well with dinner journaling.  General review of systems is unchanged or negative.   Assessment/Plan:   1. Essential hypertension. Oran's diastolic blood pressure is mildly elevated today. It is normally controlled on medications. He continues to do well with weight loss.  Keir will continue diet and exercise. Will recheck blood pressure in 3 weeks.   2. Class 2 severe obesity with serious comorbidity and body mass index (BMI) of 39.0 to 39.9 in adult, unspecified obesity type [facility])    Petter is currently in the action stage of change. As such, his goal is to continue with weight loss efforts. He has agreed to Category 4 Plan and journal 400-650 calories and 40+ grams of protein at supper.  We discussed the following exercise goals today: For substantial health benefits, adults should do at least 150 minutes (2 hours and 30 minutes) a week of moderate-intensity, or 75 minutes (1 hour and 15 minutes) a week of vigorous-intensity aerobic physical activity, or an equivalent combination of moderate- and vigorous-intensity aerobic activity. Aerobic activity should be performed in episodes of at least 10 minutes, and preferably, it should be spread throughout the week. Adults should also include muscle-strengthening activities that involve all major muscle groups on 2 or more days a week.  We  discussed the following behavioral modification strategies today: increasing lean protein intake and meal planning and cooking strategies.  Janalyn Shy has agreed to follow-up with our clinic in 3 weeks. He was informed of the importance of frequent follow-up visits to maximize his success with intensive lifestyle modifications for his multiple health conditions.  Objective:   Blood pressure (!) 138/91, pulse 81, temperature 97.7 F (36.5 C), temperature source Oral, height 5\' 9"  (1.753 m), weight 266 lb (120.7 kg), SpO2 98 %. Body mass index is 39.28 kg/m.  General: Cooperative, alert, well developed, in no acute distress. HEENT: Conjunctivae and lids unremarkable. Neck: No thyromegaly.  Cardiovascular: Regular rhythm.  Lungs: Normal work of breathing. Extremities: No edema.  Neurologic: No focal deficits.   Lab Results  Component Value Date   CREATININE 1.12 09/03/2019   BUN 17 09/03/2019   NA 140 09/03/2019   K 3.9 09/03/2019   CL 104 09/03/2019   CO2 25 09/03/2019   Lab Results  Component Value Date   ALT 32 09/03/2019   AST 22 09/03/2019   ALKPHOS 64 09/03/2019   BILITOT 0.3 09/03/2019   Lab Results  Component Value Date   HGBA1C 5.7 (H) 09/03/2019   HGBA1C 5.7 05/07/2019   HGBA1C 5.9 (H) 04/10/2019   Lab Results  Component Value Date   INSULIN 24.2 09/03/2019   INSULIN 30.9 (H) 04/10/2019   Lab Results  Component Value Date   TSH 1.340 04/10/2019   Lab Results  Component Value Date   CHOL 161 09/03/2019   HDL 44 09/03/2019   LDLCALC 94  09/03/2019   TRIG 132 09/03/2019   Lab Results  Component Value Date   WBC 6.0 04/10/2019   HGB 13.4 04/10/2019   HCT 39.2 04/10/2019   MCV 82 04/10/2019   PLT 235 06/15/2016   No results found for: IRON, TIBC, FERRITIN Obesity Behavioral Intervention Visit Documentation for Insurance (15 Minutes):   ASK: We discussed the diagnosis of obesity with Janalyn Shy today and [name] agreed to give [place]  permission to discuss obesity behavioral modification therapy today.  ASSESS: Beulah has the diagnosis of obesity and his BMI today is 39.4. Deegan is in the action stage of change.   ADVISE: Jarryd was educated on the multiple health risks of obesity as well as the benefit of weight loss to improve his health. He was advised of the need for long term treatment and the importance of lifestyle modifications to improve his current health and to decrease his risk of future health problems.  AGREE: Multiple dietary modification options and treatment options were discussed and Raad agreed to follow the recommendations documented in the above note.  ARRANGE: Hartman was educated on the importance of frequent visits to treat obesity as outlined per CMS and USPSTF guidelines and agreed to schedule his next follow up appointment today.  Attestation Statements:   Reviewed by clinician on day of visit: allergies, medications, problem list, medical history, surgical history, family history, social history and previous encounter notes.  This visit occurred during the SARS-CoV-2 public health emergency. Safety protocols were in place, including screening questions prior to the visit, additional usage of staff PPE, and extensive cleaning of exam room while observing appropriate contact time as indicated for disinfecting solutions. (CPT [id number])  A total of 20 minutes was spent on visit including pre visit chart review and post visit charting  I, Michaelene Song, am acting as transcriptionist for Dennard Nip, MD  I have reviewed the above documentation for accuracy and completeness, and I agree with the above. -   Dennard Nip, MD

## 2019-12-11 ENCOUNTER — Ambulatory Visit (INDEPENDENT_AMBULATORY_CARE_PROVIDER_SITE_OTHER): Payer: 59 | Admitting: Psychology

## 2019-12-11 DIAGNOSIS — F4321 Adjustment disorder with depressed mood: Secondary | ICD-10-CM | POA: Diagnosis not present

## 2019-12-19 ENCOUNTER — Ambulatory Visit (INDEPENDENT_AMBULATORY_CARE_PROVIDER_SITE_OTHER): Payer: 59 | Admitting: Family Medicine

## 2019-12-19 ENCOUNTER — Encounter (INDEPENDENT_AMBULATORY_CARE_PROVIDER_SITE_OTHER): Payer: Self-pay | Admitting: Family Medicine

## 2019-12-19 ENCOUNTER — Other Ambulatory Visit: Payer: Self-pay

## 2019-12-19 VITALS — BP 125/88 | HR 77 | Temp 98.0°F | Ht 69.0 in | Wt 266.0 lb

## 2019-12-19 DIAGNOSIS — I10 Essential (primary) hypertension: Secondary | ICD-10-CM

## 2019-12-19 DIAGNOSIS — Z9189 Other specified personal risk factors, not elsewhere classified: Secondary | ICD-10-CM

## 2019-12-19 DIAGNOSIS — E7849 Other hyperlipidemia: Secondary | ICD-10-CM

## 2019-12-19 DIAGNOSIS — R7303 Prediabetes: Secondary | ICD-10-CM | POA: Diagnosis not present

## 2019-12-19 DIAGNOSIS — F3289 Other specified depressive episodes: Secondary | ICD-10-CM

## 2019-12-19 DIAGNOSIS — Z6839 Body mass index (BMI) 39.0-39.9, adult: Secondary | ICD-10-CM

## 2019-12-19 DIAGNOSIS — E559 Vitamin D deficiency, unspecified: Secondary | ICD-10-CM

## 2019-12-19 MED ORDER — BENAZEPRIL-HYDROCHLOROTHIAZIDE 10-12.5 MG PO TABS: 1.0000 | ORAL_TABLET | Freq: Two times a day (BID) | ORAL | 0 refills | Status: DC

## 2019-12-19 MED ORDER — BUPROPION HCL ER (SR) 200 MG PO TB12: 200.0000 mg | ORAL_TABLET | Freq: Two times a day (BID) | ORAL | 0 refills | Status: DC

## 2019-12-19 NOTE — Progress Notes (Signed)
Chief Complaint:   OBESITY Kenneth Green is here to discuss his progress with his obesity treatment plan along with follow-up of his obesity related diagnoses. Kenneth Green is on the Category 4 Plan and keeping a food journal and adhering to recommended goals of 400-650 calories and 40+ grams of protein at supper daily and states he is following his eating plan approximately 75% of the time. Kenneth Green states he is walking for 45 minutes 3 times per week.  Today's visit was #: 16 Starting weight: 308 lbs Starting date: 04/10/2019 Today's weight: 266 lbs Today's date: 12/19/2019 Total lbs lost to date: 42 Total lbs lost since last in-office visit: 0  Interim History: Kenneth Green has done well maintaining his weight on his Category 4 plan. His hunger is mostly controlled but he notes increased work stress, and he hasn't concentrated on his meal plan as much.  Subjective:   1. Essential hypertension Kenneth Green's blood pressure is well controlled, and he denies chest pain. He requests a refill today and he is due for labs.  2. Vitamin D deficiency Kenneth Green is on Vit D, and he is due for labs. He notes his fatigue has improved.  3. Pre-diabetes Kenneth Green is working on diet and exercise, and he is due for labs.  4. Other hyperlipidemia Kenneth Green is working on improving his cholesterol with diet. He is due to have labs rechecked.  5. Other depression, emotional eating  Kenneth Green has had increased stress at work with ex-employees threatening him. He has done a bit more stress eating, but he is ready to get back on track. His blood pressure and sleep are stable.  6. At risk for dehydration Kenneth Green is at a higher than average risk for dehydration due to weight loss.  Assessment/Plan:   1. Essential hypertension Kenneth Green is working on healthy weight loss and exercise to improve blood pressure control. We will check labs today, and we will refill Lotensin for 1 month. We will watch for signs of hypotension as he  continues his lifestyle modifications.   - benazepril-hydrochlorthiazide (LOTENSIN HCT) 10-12.5 MG tablet; Take 1 tablet by mouth 2 (two) times daily.  Dispense: 60 tablet; Refill: 0  2. Vitamin D deficiency Low Vitamin D level contributes to fatigue and are associated with obesity, breast, and colon cancer. We will check labs today, and Kenneth Green will follow-up for routine testing of Vitamin D, at least 2-3 times per year to avoid over-replacement.  - VITAMIN D 25 Hydroxy (Vit-D Deficiency, Fractures)  3. Pre-diabetes Kenneth Green will continue to work on weight loss, diet, exercise, and decreasing simple carbohydrates to help decrease the risk of diabetes. We will check labs today and will continue to follow up.  - Comprehensive metabolic panel - Hemoglobin A1c - Insulin, random  4. Other hyperlipidemia Cardiovascular risk and specific lipid/LDL goals reviewed. We discussed several lifestyle modifications today and Amogh will continue to work on diet, exercise and weight loss efforts. We will check labs today. Orders and follow up as documented in patient record.   Counseling Intensive lifestyle modifications are the first line treatment for this issue. . Dietary changes: Increase soluble fiber. Decrease simple carbohydrates. . Exercise changes: Moderate to vigorous-intensity aerobic activity 150 minutes per week if tolerated. . Lipid-lowering medications: see documented in medical record.  - Lipid Panel With LDL/HDL Ratio  5. Other depression, emotional eating  Behavior modification techniques were discussed today to help Kenneth Green deal with his emotional/non-hunger eating behaviors. We will refill Wellbutrin for 1 month. Orders and follow  up as documented in patient record.   - buPROPion (WELLBUTRIN SR) 200 MG 12 hr tablet; Take 1 tablet (200 mg total) by mouth 2 (two) times daily.  Dispense: 60 tablet; Refill: 0  6. At risk for dehydration Kenneth Green was given approximately 15 minutes  dehydration prevention counseling today. Kenneth Green is at risk for dehydration due to weight loss and current medication(s). He was encouraged to hydrate and monitor fluid status to avoid dehydration as well as weight loss plateaus.   7. Class 2 severe obesity with serious comorbidity and body mass index (BMI) of 39.0 to 39.9 in adult, unspecified obesity type Kenneth Green) Kenneth Green is currently in the action stage of change. As such, his goal is to continue with weight loss efforts. He has agreed to the Category 4 Plan or following a lower carbohydrate, vegetable and lean protein rich diet plan.   Exercise goals:As is  Behavioral modification strategies: meal planning and cooking strategies and emotional eating strategies.  Kenneth Green has agreed to follow-up with our clinic in 3 weeks. He was informed of the importance of frequent follow-up visits to maximize his success with intensive lifestyle modifications for his multiple health conditions.   Kenneth Green was informed we would discuss his lab results at his next visit unless there is a critical issue that needs to be addressed sooner. Kenneth Green agreed to keep his next visit at the agreed upon time to discuss these results.  Objective:   Blood pressure 125/88, pulse 77, temperature 98 F (36.7 C), temperature source Oral, height 5\' 9"  (1.753 m), weight 266 lb (120.7 kg), SpO2 98 %. Body mass index is 39.28 kg/m.  General: Cooperative, alert, well developed, in no acute distress. HEENT: Conjunctivae and lids unremarkable. Cardiovascular: Regular rhythm.  Lungs: Normal work of breathing. Neurologic: No focal deficits.   Lab Results  Component Value Date   CREATININE 1.12 09/03/2019   BUN 17 09/03/2019   NA 140 09/03/2019   K 3.9 09/03/2019   CL 104 09/03/2019   CO2 25 09/03/2019   Lab Results  Component Value Date   ALT 32 09/03/2019   AST 22 09/03/2019   ALKPHOS 64 09/03/2019   BILITOT 0.3 09/03/2019   Lab Results  Component Value Date    HGBA1C 5.7 (H) 09/03/2019   HGBA1C 5.7 05/07/2019   HGBA1C 5.9 (H) 04/10/2019   Lab Results  Component Value Date   INSULIN 24.2 09/03/2019   INSULIN 30.9 (H) 04/10/2019   Lab Results  Component Value Date   TSH 1.340 04/10/2019   Lab Results  Component Value Date   CHOL 161 09/03/2019   HDL 44 09/03/2019   LDLCALC 94 09/03/2019   TRIG 132 09/03/2019   Lab Results  Component Value Date   WBC 6.0 04/10/2019   HGB 13.4 04/10/2019   HCT 39.2 04/10/2019   MCV 82 04/10/2019   PLT 235 06/15/2016   No results found for: IRON, TIBC, FERRITIN  Attestation Statements:   Reviewed by clinician on day of visit: allergies, medications, problem list, medical history, surgical history, family history, social history, and previous encounter notes.   I, Trixie Dredge, am acting as transcriptionist for Dennard Nip, MD.  I have reviewed the above documentation for accuracy and completeness, and I agree with the above. -  Dennard Nip, MD

## 2019-12-20 LAB — COMPREHENSIVE METABOLIC PANEL
ALT: 29 IU/L (ref 0–44)
AST: 28 IU/L (ref 0–40)
Albumin/Globulin Ratio: 1.8 (ref 1.2–2.2)
Albumin: 4.6 g/dL (ref 3.8–4.9)
Alkaline Phosphatase: 64 IU/L (ref 39–117)
BUN/Creatinine Ratio: 11 (ref 9–20)
BUN: 13 mg/dL (ref 6–24)
Bilirubin Total: 0.6 mg/dL (ref 0.0–1.2)
CO2: 21 mmol/L (ref 20–29)
Calcium: 9.9 mg/dL (ref 8.7–10.2)
Chloride: 104 mmol/L (ref 96–106)
Creatinine, Ser: 1.15 mg/dL (ref 0.76–1.27)
GFR calc Af Amer: 82 mL/min/{1.73_m2} (ref >59)
GFR calc non Af Amer: 71 mL/min/{1.73_m2} (ref >59)
Globulin, Total: 2.5 g/dL (ref 1.5–4.5)
Glucose: 104 mg/dL — ABNORMAL HIGH (ref 65–99)
Potassium: 3.9 mmol/L (ref 3.5–5.2)
Sodium: 140 mmol/L (ref 134–144)
Total Protein: 7.1 g/dL (ref 6.0–8.5)

## 2019-12-20 LAB — VITAMIN D 25 HYDROXY (VIT D DEFICIENCY, FRACTURES): Vit D, 25-Hydroxy: 76.8 ng/mL (ref 30.0–100.0)

## 2019-12-20 LAB — HEMOGLOBIN A1C
Est. average glucose Bld gHb Est-mCnc: 114 mg/dL
Hgb A1c MFr Bld: 5.6 % (ref 4.8–5.6)

## 2019-12-20 LAB — LIPID PANEL WITH LDL/HDL RATIO
Cholesterol, Total: 164 mg/dL (ref 100–199)
HDL: 49 mg/dL (ref >39)
LDL Chol Calc (NIH): 94 mg/dL (ref 0–99)
LDL/HDL Ratio: 1.9 ratio (ref 0.0–3.6)
Triglycerides: 116 mg/dL (ref 0–149)
VLDL Cholesterol Cal: 21 mg/dL (ref 5–40)

## 2019-12-20 LAB — INSULIN, RANDOM: INSULIN: 27 u[IU]/mL — ABNORMAL HIGH (ref 2.6–24.9)

## 2019-12-31 ENCOUNTER — Ambulatory Visit (INDEPENDENT_AMBULATORY_CARE_PROVIDER_SITE_OTHER): Payer: 59 | Admitting: Psychology

## 2019-12-31 DIAGNOSIS — F4321 Adjustment disorder with depressed mood: Secondary | ICD-10-CM | POA: Diagnosis not present

## 2020-01-06 ENCOUNTER — Other Ambulatory Visit: Payer: Self-pay | Admitting: Physician Assistant

## 2020-01-06 DIAGNOSIS — U071 COVID-19: Secondary | ICD-10-CM

## 2020-01-06 DIAGNOSIS — I10 Essential (primary) hypertension: Secondary | ICD-10-CM

## 2020-01-06 NOTE — Progress Notes (Signed)
  I connected by phone with Janalyn Shy on 01/06/2020 at 2:29 PM to discuss the potential use of an new treatment for mild to moderate COVID-19 viral infection in non-hospitalized patients.  This patient is a 54 y.o. male that meets the FDA criteria for Emergency Use Authorization of bamlanivimab or casirivimab\imdevimab.  Has a (+) direct SARS-CoV-2 viral test result  Has mild or moderate COVID-19   Is ? 56 years of age and weighs ? 40 kg  Is NOT hospitalized due to COVID-19  Is NOT requiring oxygen therapy or requiring an increase in baseline oxygen flow rate due to COVID-19  Is within 10 days of symptom onset  Has at least one of the high risk factor(s) for progression to severe COVID-19 and/or hospitalization as defined in EUA.  Specific high risk criteria : BMI >/= 35   Hx of HTN   I have spoken and communicated the following to the patient or parent/caregiver:  1. FDA has authorized the emergency use of bamlanivimab and casirivimab\imdevimab for the treatment of mild to moderate COVID-19 in adults and pediatric patients with positive results of direct SARS-CoV-2 viral testing who are 24 years of age and older weighing at least 40 kg, and who are at high risk for progressing to severe COVID-19 and/or hospitalization.  2. The significant known and potential risks and benefits of bamlanivimab and casirivimab\imdevimab, and the extent to which such potential risks and benefits are unknown.  3. Information on available alternative treatments and the risks and benefits of those alternatives, including clinical trials.  4. Patients treated with bamlanivimab and casirivimab\imdevimab should continue to self-isolate and use infection control measures (e.g., wear mask, isolate, social distance, avoid sharing personal items, clean and disinfect "high touch" surfaces, and frequent handwashing) according to CDC guidelines.   5. The patient or parent/caregiver has the option to accept or  refuse bamlanivimab or casirivimab\imdevimab .  After reviewing this information with the patient, The patient agreed to proceed with receiving the bamlanimivab infusion and will be provided a copy of the Fact sheet prior to receiving the infusion.Crista Luria Avante Carneiro 01/06/2020 2:29 PM

## 2020-01-07 ENCOUNTER — Ambulatory Visit (HOSPITAL_COMMUNITY)
Admission: RE | Admit: 2020-01-07 | Discharge: 2020-01-07 | Disposition: A | Discharge status: Home / Self Care | Payer: 59 | Source: Ambulatory Visit | Attending: Pulmonary Disease | Admitting: Pulmonary Disease

## 2020-01-07 DIAGNOSIS — U071 COVID-19: Secondary | ICD-10-CM | POA: Insufficient documentation

## 2020-01-07 DIAGNOSIS — Z23 Encounter for immunization: Secondary | ICD-10-CM | POA: Insufficient documentation

## 2020-01-07 MED ORDER — DIPHENHYDRAMINE HCL 50 MG/ML IJ SOLN: 50.0000 mg | Freq: Once | INTRAMUSCULAR | Status: DC | PRN

## 2020-01-07 MED ORDER — SODIUM CHLORIDE 0.9 % IV SOLN
700.0000 mg | Freq: Once | INTRAVENOUS | Status: AC
  Administered 2020-01-07: 700 mg via INTRAVENOUS
  Filled 2020-01-07: qty 20

## 2020-01-07 MED ORDER — EPINEPHRINE 0.3 MG/0.3ML IJ SOAJ: 0.3000 mg | Freq: Once | INTRAMUSCULAR | Status: DC | PRN

## 2020-01-07 MED ORDER — ALBUTEROL SULFATE HFA 108 (90 BASE) MCG/ACT IN AERS: 2.0000 | INHALATION_SPRAY | Freq: Once | RESPIRATORY_TRACT | Status: DC | PRN

## 2020-01-07 MED ORDER — FAMOTIDINE IN NACL 20-0.9 MG/50ML-% IV SOLN: 20.0000 mg | Freq: Once | INTRAVENOUS | Status: DC | PRN

## 2020-01-07 MED ORDER — METHYLPREDNISOLONE SODIUM SUCC 125 MG IJ SOLR: 125.0000 mg | Freq: Once | INTRAMUSCULAR | Status: DC | PRN

## 2020-01-07 MED ORDER — SODIUM CHLORIDE 0.9 % IV SOLN
INTRAVENOUS | Status: DC | PRN
  Administered 2020-01-07: 10:00:00 250 mL via INTRAVENOUS

## 2020-01-07 NOTE — Discharge Instructions (Signed)

## 2020-01-07 NOTE — Progress Notes (Signed)
[name], male   DOB: 03-18-1966, 54 y.o.   MRN: [id number]  Diagnosis: [id number]   Procedure: [facility] Med: bamlanivimab infusion - Provided patient with bamlanimivab fact sheet for patients, parents and caregivers prior to infusion.  Complications: No immediate complications noted.  Discharge: Discharged home   Heide Scales 01/07/2020

## 2020-01-09 ENCOUNTER — Other Ambulatory Visit (INDEPENDENT_AMBULATORY_CARE_PROVIDER_SITE_OTHER): Payer: Self-pay | Admitting: Family Medicine

## 2020-01-09 DIAGNOSIS — F3289 Other specified depressive episodes: Secondary | ICD-10-CM

## 2020-01-14 ENCOUNTER — Ambulatory Visit (INDEPENDENT_AMBULATORY_CARE_PROVIDER_SITE_OTHER): Payer: 59 | Admitting: Family Medicine

## 2020-01-23 ENCOUNTER — Ambulatory Visit (INDEPENDENT_AMBULATORY_CARE_PROVIDER_SITE_OTHER): Payer: 59 | Admitting: Psychology

## 2020-01-23 DIAGNOSIS — F4321 Adjustment disorder with depressed mood: Secondary | ICD-10-CM | POA: Diagnosis not present

## 2020-02-05 ENCOUNTER — Other Ambulatory Visit (INDEPENDENT_AMBULATORY_CARE_PROVIDER_SITE_OTHER): Payer: Self-pay | Admitting: Family Medicine

## 2020-02-05 DIAGNOSIS — F3289 Other specified depressive episodes: Secondary | ICD-10-CM

## 2020-02-11 ENCOUNTER — Encounter (INDEPENDENT_AMBULATORY_CARE_PROVIDER_SITE_OTHER): Payer: Self-pay | Admitting: Family Medicine

## 2020-02-11 ENCOUNTER — Other Ambulatory Visit: Payer: Self-pay

## 2020-02-11 ENCOUNTER — Ambulatory Visit (INDEPENDENT_AMBULATORY_CARE_PROVIDER_SITE_OTHER): Payer: 59 | Admitting: Family Medicine

## 2020-02-11 VITALS — BP 131/87 | HR 77 | Temp 97.8°F | Ht 69.0 in | Wt 272.0 lb

## 2020-02-11 DIAGNOSIS — I10 Essential (primary) hypertension: Secondary | ICD-10-CM

## 2020-02-11 DIAGNOSIS — Z6841 Body Mass Index (BMI) 40.0 and over, adult: Secondary | ICD-10-CM | POA: Diagnosis not present

## 2020-02-11 DIAGNOSIS — F3289 Other specified depressive episodes: Secondary | ICD-10-CM | POA: Diagnosis not present

## 2020-02-11 MED ORDER — BUPROPION HCL ER (SR) 200 MG PO TB12: 200.0000 mg | ORAL_TABLET | Freq: Two times a day (BID) | ORAL | 0 refills | Status: DC

## 2020-02-12 NOTE — Progress Notes (Signed)
Chief Complaint:   OBESITY Kenneth Green is here to discuss his progress with his obesity treatment plan along with follow-up of his obesity related diagnoses. Kenneth Green is on the Category 4 Plan or following a lower carbohydrate, vegetable and lean protein rich diet plan and states he is following his eating plan approximately 50% of the time. Kenneth Green states he is doing 0 minutes 0 times per week.  Today's visit was #: 59 Starting weight: 308 lbs Starting date: 04/10/2019 Today's weight: 272 lbs Today's date: 02/11/2020 Total lbs lost to date: 36 Total lbs lost since last in-office visit: 0  Interim History: Kenneth Green and his wife had COVID virus, and worst symptoms was chest tightness. He received noncloral antibody infusion therapy on 01/07/2020. His church community provided copious amounts of "recovery food". He states "we are now ready to rededicate ourselves". He reports sound sleep and good energy levels.  Subjective:   1. Essential hypertension Kenneth Green is currently on benazepril-lotensin 10-12.5 mg BID. Last CMP was stable on 12/19/2019. He was encouraged to remain well hydrated with water.   2. Other depression, with emotional eating Kenneth Green reports stable mood and he is tolerating bupropion well.  Assessment/Plan:   1. Essential hypertension Kenneth Green will continue his Category 4 meal plan, and will continue to work on healthy weight loss and exercise to improve blood pressure control. We will watch for signs of hypotension as he continues his lifestyle modifications. Kenneth Green agreed to continue his current anti-hypertensive therapy.   2. Other depression, with emotional eating Behavior modification techniques were discussed today to help Kenneth Green deal with his emotional/non-hunger eating behaviors. We will refill bupropion for 1 month. Orders and follow up as documented in patient record.   - buPROPion (WELLBUTRIN SR) 200 MG 12 hr tablet; Take 1 tablet (200 mg total) by mouth 2 (two)  times daily.  Dispense: 60 tablet; Refill: 0  3. Class 3 severe obesity with serious comorbidity and body mass index (BMI) of 40.0 to 44.9 in adult, unspecified obesity type [facility]) Kenneth Green is currently in the action stage of change. As such, his goal is to continue with weight loss efforts. He has agreed to the Category 4 Plan.   Behavioral modification strategies: no skipping meals and meal planning and cooking strategies.  [name] has agreed to follow-up with our clinic in 4 weeks. He was informed of the importance of frequent follow-up visits to maximize his success with intensive lifestyle modifications for his multiple health conditions.   Objective:   Blood pressure 131/87, pulse 77, temperature 97.8 F (36.6 C), temperature source Oral, height 5\' 9"  (1.753 m), weight 272 lb (123.4 kg), SpO2 97 %. Body mass index is 40.17 kg/m.  General: Cooperative, alert, well developed, in no acute distress. HEENT: Conjunctivae and lids unremarkable. Cardiovascular: Regular rhythm.  Lungs: Normal work of breathing. Neurologic: No focal deficits.   Lab Results  Component Value Date   CREATININE 1.15 12/19/2019   BUN 13 12/19/2019   NA 140 12/19/2019   K 3.9 12/19/2019   CL 104 12/19/2019   CO2 21 12/19/2019   Lab Results  Component Value Date   ALT 29 12/19/2019   AST 28 12/19/2019   ALKPHOS 64 12/19/2019   BILITOT 0.6 12/19/2019   Lab Results  Component Value Date   HGBA1C 5.6 12/19/2019   HGBA1C 5.7 (H) 09/03/2019   HGBA1C 5.7 05/07/2019   HGBA1C 5.9 (H) 04/10/2019   Lab Results  Component Value Date   INSULIN 27.0 (H) 12/19/2019  INSULIN 24.2 09/03/2019   INSULIN 30.9 (H) 04/10/2019   Lab Results  Component Value Date   TSH 1.340 04/10/2019   Lab Results  Component Value Date   CHOL 164 12/19/2019   HDL 49 12/19/2019   LDLCALC 94 12/19/2019   TRIG 116 12/19/2019   Lab Results  Component Value Date   WBC 6.0 04/10/2019   HGB 13.4 04/10/2019   HCT 39.2  04/10/2019   MCV 82 04/10/2019   PLT 235 06/15/2016   No results found for: IRON, TIBC, FERRITIN  Attestation Statements:   Reviewed by clinician on day of visit: allergies, medications, problem list, medical history, surgical history, family history, social history, and previous encounter notes.   I, Trixie Dredge, am acting as transcriptionist for Dennard Nip, MD.  I have reviewed the above documentation for accuracy and completeness, and I agree with the above. -  Dennard Nip, MD

## 2020-02-14 ENCOUNTER — Ambulatory Visit (INDEPENDENT_AMBULATORY_CARE_PROVIDER_SITE_OTHER): Payer: 59 | Admitting: Psychology

## 2020-02-14 DIAGNOSIS — F4321 Adjustment disorder with depressed mood: Secondary | ICD-10-CM | POA: Diagnosis not present

## 2020-03-06 ENCOUNTER — Other Ambulatory Visit (INDEPENDENT_AMBULATORY_CARE_PROVIDER_SITE_OTHER): Payer: Self-pay | Admitting: Family Medicine

## 2020-03-06 DIAGNOSIS — F3289 Other specified depressive episodes: Secondary | ICD-10-CM

## 2020-03-10 ENCOUNTER — Ambulatory Visit (INDEPENDENT_AMBULATORY_CARE_PROVIDER_SITE_OTHER): Payer: 59 | Admitting: Family Medicine

## 2020-03-10 ENCOUNTER — Other Ambulatory Visit: Payer: Self-pay

## 2020-03-10 ENCOUNTER — Encounter (INDEPENDENT_AMBULATORY_CARE_PROVIDER_SITE_OTHER): Payer: Self-pay | Admitting: Family Medicine

## 2020-03-10 VITALS — BP 130/86 | HR 67 | Temp 97.7°F | Ht 69.0 in | Wt 273.0 lb

## 2020-03-10 DIAGNOSIS — F3289 Other specified depressive episodes: Secondary | ICD-10-CM

## 2020-03-10 DIAGNOSIS — Z6841 Body Mass Index (BMI) 40.0 and over, adult: Secondary | ICD-10-CM | POA: Diagnosis not present

## 2020-03-10 MED ORDER — BUPROPION HCL ER (SR) 200 MG PO TB12: 200.0000 mg | ORAL_TABLET | Freq: Two times a day (BID) | ORAL | 0 refills | Status: DC

## 2020-03-11 NOTE — Progress Notes (Signed)
Chief Complaint:   OBESITY Kenneth Green is here to discuss his progress with his obesity treatment plan along with follow-up of his obesity related diagnoses. Kenneth Green is on the Category 4 Plan and states he is following his eating plan approximately 70% of the time. Kenneth Green states he is walking for 45 minutes 5 times per week.  Today's visit was #: 18 Starting weight: 308 lbs Starting date: 04/10/2019 Today's weight: 273 lbs Today's date: 03/10/2020 Total lbs lost to date: 35 Total lbs lost since last in-office visit: 0  Interim History: Kenneth Green is struggling to stay on track with his Category 4 plan. He notes increased work and home stress, and meal planning and prepping has decreased. He notes giving into cravings more often.  Subjective:   1. Other depression, with emotional eating Kenneth Green has increased stress and he notes increased comfort eating in the last few weeks. He is tolerating Wellbutrin and his blood pressure is stable.  Assessment/Plan:   1. Other depression, with emotional eating Emotional eating was discussed today to help Kenneth Green deal with his emotional/non-hunger eating behaviors. We will refill Wellbutrin SR for 1 month. Orders and follow up as documented in patient record.   - buPROPion (WELLBUTRIN SR) 200 MG 12 hr tablet; Take 1 tablet (200 mg total) by mouth 2 (two) times daily.  Dispense: 60 tablet; Refill: 0  2. Class 3 severe obesity with serious comorbidity and body mass index (BMI) of 40.0 to 44.9 in adult, unspecified obesity type Kenneth Green) Kenneth Green is currently in the action stage of change. As such, his goal is to continue with weight loss efforts. He has agreed to keeping a food journal and adhering to recommended goals of 1500-1800 calories and 100+ grams of protein daily.   Exercise goals: As is.  Behavioral modification strategies: increasing lean protein intake, emotional eating strategies and keeping a strict food journal.  Kenneth Green has agreed to  follow-up with our clinic in 3 weeks. He was informed of the importance of frequent follow-up visits to maximize his success with intensive lifestyle modifications for his multiple health conditions.   Objective:   Blood pressure 130/86, pulse 67, temperature 97.7 F (36.5 C), temperature source Oral, height 5\' 9"  (1.753 m), weight 273 lb (123.8 kg), SpO2 98 %. Body mass index is 40.32 kg/m.  General: Cooperative, alert, well developed, in no acute distress. HEENT: Conjunctivae and lids unremarkable. Cardiovascular: Regular rhythm.  Lungs: Normal work of breathing. Neurologic: No focal deficits.   Lab Results  Component Value Date   CREATININE 1.15 12/19/2019   BUN 13 12/19/2019   NA 140 12/19/2019   K 3.9 12/19/2019   CL 104 12/19/2019   CO2 21 12/19/2019   Lab Results  Component Value Date   ALT 29 12/19/2019   AST 28 12/19/2019   ALKPHOS 64 12/19/2019   BILITOT 0.6 12/19/2019   Lab Results  Component Value Date   HGBA1C 5.6 12/19/2019   HGBA1C 5.7 (H) 09/03/2019   HGBA1C 5.7 05/07/2019   HGBA1C 5.9 (H) 04/10/2019   Lab Results  Component Value Date   INSULIN 27.0 (H) 12/19/2019   INSULIN 24.2 09/03/2019   INSULIN 30.9 (H) 04/10/2019   Lab Results  Component Value Date   TSH 1.340 04/10/2019   Lab Results  Component Value Date   CHOL 164 12/19/2019   HDL 49 12/19/2019   LDLCALC 94 12/19/2019   TRIG 116 12/19/2019   Lab Results  Component Value Date   WBC 6.0 04/10/2019  HGB 13.4 04/10/2019   HCT 39.2 04/10/2019   MCV 82 04/10/2019   PLT 235 06/15/2016   No results found for: IRON, TIBC, FERRITIN  Attestation Statements:   Reviewed by clinician on day of visit: allergies, medications, problem list, medical history, surgical history, family history, social history, and previous encounter notes.  Time spent on visit including pre-visit chart review and post-visit care and charting was 30 minutes.    I, Trixie Dredge, am acting as  transcriptionist for Dennard Nip, MD.  I have reviewed the above documentation for accuracy and completeness, and I agree with the above. -  Dennard Nip, MD

## 2020-03-13 ENCOUNTER — Ambulatory Visit (INDEPENDENT_AMBULATORY_CARE_PROVIDER_SITE_OTHER): Payer: 59 | Admitting: Psychology

## 2020-03-13 DIAGNOSIS — F4321 Adjustment disorder with depressed mood: Secondary | ICD-10-CM

## 2020-03-31 ENCOUNTER — Encounter (INDEPENDENT_AMBULATORY_CARE_PROVIDER_SITE_OTHER): Payer: Self-pay | Admitting: Family Medicine

## 2020-03-31 ENCOUNTER — Other Ambulatory Visit: Payer: Self-pay

## 2020-03-31 ENCOUNTER — Ambulatory Visit (INDEPENDENT_AMBULATORY_CARE_PROVIDER_SITE_OTHER): Payer: 59 | Admitting: Family Medicine

## 2020-03-31 VITALS — BP 142/91 | HR 69 | Temp 97.8°F | Ht 69.0 in | Wt 271.0 lb

## 2020-03-31 DIAGNOSIS — I10 Essential (primary) hypertension: Secondary | ICD-10-CM | POA: Diagnosis not present

## 2020-03-31 DIAGNOSIS — F3289 Other specified depressive episodes: Secondary | ICD-10-CM | POA: Diagnosis not present

## 2020-03-31 DIAGNOSIS — Z6841 Body Mass Index (BMI) 40.0 and over, adult: Secondary | ICD-10-CM

## 2020-03-31 NOTE — Progress Notes (Signed)
Chief Complaint:   OBESITY Kenneth Green is here to discuss his progress with his obesity treatment plan along with follow-up of his obesity related diagnoses. Kenneth Green is on keeping a food journal and adhering to recommended goals of 1500-1800 calories and 100+ grams of protein daily and states he is following his eating plan approximately 75% of the time. Kenneth Green states he is doing 0 minutes 0 times per week.  Today's visit was #: 35 Starting weight: 308 lbs Starting date: 04/10/2019 Today's weight: 271 lbs Today's date: 03/31/2020 Total lbs lost to date: 37 Total lbs lost since last in-office visit: 2  Interim History: Kenneth Green prefers the structure of the Category 3 meal plan over daily journaling plan. He has been adding additional lean protein to his frozen microwave meals at lunch to boost his protein and taste.  Subjective:   1. Essential hypertension Kenneth Green's blood pressure is at level at his office visit today. He reports taking Lotensin 10-12.5 mg just prior to coming into the clinic today. His home blood pressure cuff recently broke, and he has yet to replace it. He denies chest pain, dizziness, or headaches.  2. Other depression, with emotional eating Kenneth Green has been on bupropion since his early 90's. He reports stable mood, and he denies suicidal ideas or homicidal ideas. He denies increased emotional eating or snacking.  Assessment/Plan:   1. Essential hypertension Kenneth Green is working on healthy weight loss and exercise to improve blood pressure control. We will watch for signs of hypotension as he continues his lifestyle modifications. Kenneth Green will continue his current anti-hypertensive therapy. He will obtain a new blood pressure cuff and check his blood pressure at home several times per week, or if symptoms develop.  2. Other depression, with emotional eating Behavior modification techniques were discussed today to help Kenneth Green deal with his emotional/non-hunger eating  behaviors. Kenneth Green will continue his current bupropion dosage, and continue his Category 3 meal plan. Orders and follow up as documented in patient record.   3. Class 3 severe obesity with serious comorbidity and body mass index (BMI) of 40.0 to 44.9 in adult, unspecified obesity type Kenneth Green) Kenneth Green is currently in the action stage of change. As such, his goal is to continue with weight loss efforts. He has agreed to the Category 3 Plan.   Exercise goals: No exercise has been prescribed at this time.  Behavioral modification strategies: increasing lean protein intake, decreasing simple carbohydrates and increasing water intake.  Kenneth Green has agreed to follow-up with our clinic in 3 to 4 weeks. He was informed of the importance of frequent follow-up visits to maximize his success with intensive lifestyle modifications for his multiple health conditions.   Objective:   Blood pressure (!) 142/91, pulse 69, temperature 97.8 F (36.6 C), temperature source Oral, height 5\' 9"  (1.753 m), weight 271 lb (122.9 kg), SpO2 99 %. Body mass index is 40.02 kg/m.  General: Cooperative, alert, well developed, in no acute distress. HEENT: Conjunctivae and lids unremarkable. Cardiovascular: Regular rhythm.  Lungs: Normal work of breathing. Neurologic: No focal deficits.   Lab Results  Component Value Date   CREATININE 1.15 12/19/2019   BUN 13 12/19/2019   NA 140 12/19/2019   K 3.9 12/19/2019   CL 104 12/19/2019   CO2 21 12/19/2019   Lab Results  Component Value Date   ALT 29 12/19/2019   AST 28 12/19/2019   ALKPHOS 64 12/19/2019   BILITOT 0.6 12/19/2019   Lab Results  Component Value Date  HGBA1C 5.6 12/19/2019   HGBA1C 5.7 (H) 09/03/2019   HGBA1C 5.7 05/07/2019   HGBA1C 5.9 (H) 04/10/2019   Lab Results  Component Value Date   INSULIN 27.0 (H) 12/19/2019   INSULIN 24.2 09/03/2019   INSULIN 30.9 (H) 04/10/2019   Lab Results  Component Value Date   TSH 1.340 04/10/2019   Lab  Results  Component Value Date   CHOL 164 12/19/2019   HDL 49 12/19/2019   LDLCALC 94 12/19/2019   TRIG 116 12/19/2019   Lab Results  Component Value Date   WBC 6.0 04/10/2019   HGB 13.4 04/10/2019   HCT 39.2 04/10/2019   MCV 82 04/10/2019   PLT 235 06/15/2016   No results found for: IRON, TIBC, FERRITIN  Attestation Statements:   Reviewed by clinician on day of visit: allergies, medications, problem list, medical history, surgical history, family history, social history, and previous encounter notes.  Time spent on visit including pre-visit chart review and post-visit care and charting was 24 minutes.    I, Trixie Dredge, am acting as transcriptionist for Dennard Nip, MD.  I have reviewed the above documentation for accuracy and completeness, and I agree with the above. -  Dennard Nip, MD

## 2020-04-04 ENCOUNTER — Other Ambulatory Visit (INDEPENDENT_AMBULATORY_CARE_PROVIDER_SITE_OTHER): Payer: Self-pay | Admitting: Family Medicine

## 2020-04-04 DIAGNOSIS — F3289 Other specified depressive episodes: Secondary | ICD-10-CM

## 2020-04-07 ENCOUNTER — Encounter (INDEPENDENT_AMBULATORY_CARE_PROVIDER_SITE_OTHER): Payer: Self-pay

## 2020-04-10 ENCOUNTER — Ambulatory Visit (INDEPENDENT_AMBULATORY_CARE_PROVIDER_SITE_OTHER): Payer: 59 | Admitting: Psychology

## 2020-04-10 DIAGNOSIS — F4321 Adjustment disorder with depressed mood: Secondary | ICD-10-CM | POA: Diagnosis not present

## 2020-04-23 ENCOUNTER — Ambulatory Visit (INDEPENDENT_AMBULATORY_CARE_PROVIDER_SITE_OTHER): Payer: 59 | Admitting: Family Medicine

## 2020-04-23 ENCOUNTER — Encounter (INDEPENDENT_AMBULATORY_CARE_PROVIDER_SITE_OTHER): Payer: Self-pay | Admitting: Family Medicine

## 2020-04-23 ENCOUNTER — Other Ambulatory Visit: Payer: Self-pay

## 2020-04-23 VITALS — BP 158/93 | HR 79 | Temp 98.0°F | Ht 69.0 in | Wt 273.0 lb

## 2020-04-23 DIAGNOSIS — Z9189 Other specified personal risk factors, not elsewhere classified: Secondary | ICD-10-CM | POA: Diagnosis not present

## 2020-04-23 DIAGNOSIS — I10 Essential (primary) hypertension: Secondary | ICD-10-CM

## 2020-04-23 DIAGNOSIS — Z6841 Body Mass Index (BMI) 40.0 and over, adult: Secondary | ICD-10-CM

## 2020-04-23 DIAGNOSIS — F3289 Other specified depressive episodes: Secondary | ICD-10-CM | POA: Diagnosis not present

## 2020-04-23 MED ORDER — BUPROPION HCL ER (SR) 200 MG PO TB12: 200.0000 mg | ORAL_TABLET | Freq: Two times a day (BID) | ORAL | 0 refills | Status: DC

## 2020-04-23 NOTE — Progress Notes (Signed)
Chief Complaint:   OBESITY Kenneth Green is here to discuss his progress with his obesity treatment plan along with follow-up of his obesity related diagnoses. Kenneth Green is on the Category 4 Plan and states he is following his eating plan approximately 75% of the time. Kenneth Green states he is walking and doing a lot more yard work for 30 minutes 6 times per week.  Today's visit was #: 20 Starting weight: 308 lbs Starting date: 04/10/2019 Today's weight: 273 lbs Today's date: 04/23/2020 Total lbs lost to date: 35 Total lbs lost since last in-office visit: 0  Interim History: Kenneth Green did some celebration eating over the [place] Day weekend, and his daughter gets married this weekend. So he has had increased stress and decreased meal planning.  Subjective:   1. Essential hypertension Kenneth Green's blood pressure is elevated today, and he notes increased stress and worsening back pain which he is going to see Ortho for next week. His blood pressure is normally well controlled on his medications and diet. He denies chest pain.  2. Other depression, with emotional eating Kenneth Green is stable on Wellbutrin, but he is still struggling with emotional eating at times especially with increased stress.  3. At risk for heart disease Kenneth Green is at a higher than average risk for cardiovascular disease due to obesity.   Assessment/Plan:   1. Essential hypertension Lavon will continue his medications, healthy weight loss, diet, and exercise to improve blood pressure control. We will watch for signs of hypotension as he continues his lifestyle modifications. We will recheck his blood pressure in 1 month.  2. Other depression, with emotional eating Behavior modification techniques were discussed today to help Kenneth Green deal with his emotional/non-hunger eating behaviors. We will refill Wellbutrin SR for 1 month. Orders and follow up as documented in patient record.   - buPROPion (WELLBUTRIN SR) 200 MG 12 hr  tablet; Take 1 tablet (200 mg total) by mouth 2 (two) times daily.  Dispense: 60 tablet; Refill: 0  3. At risk for heart disease Kenneth Green was given approximately 15 minutes of coronary artery disease prevention counseling today. He is 54 y.o. male and has risk factors for heart disease including obesity. We discussed intensive lifestyle modifications today with an emphasis on specific weight loss instructions and strategies.   Repetitive spaced learning was employed today to elicit superior memory formation and behavioral change.  4. Class 3 severe obesity with serious comorbidity and body mass index (BMI) of 40.0 to 44.9 in adult, unspecified obesity type Kenneth Green) Kenneth Green is currently in the action stage of change. As such, his goal is to continue with weight loss efforts. He has agreed to the Category 4 Plan.   Exercise goals: As is.  Behavioral modification strategies: meal planning and cooking strategies and emotional eating strategies.  Kenneth Green has agreed to follow-up with our clinic in 4 weeks. He was informed of the importance of frequent follow-up visits to maximize his success with intensive lifestyle modifications for his multiple health conditions.   Objective:   Blood pressure (!) 158/93, pulse 79, temperature 98 F (36.7 C), temperature source Oral, height 5\' 9"  (1.753 m), weight 273 lb (123.8 kg), SpO2 99 %. Body mass index is 40.32 kg/m.  General: Cooperative, alert, well developed, in no acute distress. HEENT: Conjunctivae and lids unremarkable. Cardiovascular: Regular rhythm.  Lungs: Normal work of breathing. Neurologic: No focal deficits.   Lab Results  Component Value Date   CREATININE 1.15 12/19/2019   BUN 13 12/19/2019   NA  140 12/19/2019   K 3.9 12/19/2019   CL 104 12/19/2019   CO2 21 12/19/2019   Lab Results  Component Value Date   ALT 29 12/19/2019   AST 28 12/19/2019   ALKPHOS 64 12/19/2019   BILITOT 0.6 12/19/2019   Lab Results  Component Value Date    HGBA1C 5.6 12/19/2019   HGBA1C 5.7 (H) 09/03/2019   HGBA1C 5.7 05/07/2019   HGBA1C 5.9 (H) 04/10/2019   Lab Results  Component Value Date   INSULIN 27.0 (H) 12/19/2019   INSULIN 24.2 09/03/2019   INSULIN 30.9 (H) 04/10/2019   Lab Results  Component Value Date   TSH 1.340 04/10/2019   Lab Results  Component Value Date   CHOL 164 12/19/2019   HDL 49 12/19/2019   LDLCALC 94 12/19/2019   TRIG 116 12/19/2019   Lab Results  Component Value Date   WBC 6.0 04/10/2019   HGB 13.4 04/10/2019   HCT 39.2 04/10/2019   MCV 82 04/10/2019   PLT 235 06/15/2016   No results found for: IRON, TIBC, FERRITIN  Attestation Statements:   Reviewed by clinician on day of visit: allergies, medications, problem list, medical history, surgical history, family history, social history, and previous encounter notes.   I, Trixie Dredge, am acting as transcriptionist for Dennard Nip, MD.  I have reviewed the above documentation for accuracy and completeness, and I agree with the above. -  Dennard Nip, MD

## 2020-04-30 ENCOUNTER — Ambulatory Visit (INDEPENDENT_AMBULATORY_CARE_PROVIDER_SITE_OTHER): Payer: 59 | Admitting: Psychology

## 2020-04-30 DIAGNOSIS — F4321 Adjustment disorder with depressed mood: Secondary | ICD-10-CM | POA: Diagnosis not present

## 2020-05-01 ENCOUNTER — Other Ambulatory Visit: Payer: Self-pay | Admitting: Internal Medicine

## 2020-05-01 ENCOUNTER — Other Ambulatory Visit: Payer: Self-pay | Admitting: Orthopaedic Surgery

## 2020-05-01 DIAGNOSIS — M48062 Spinal stenosis, lumbar region with neurogenic claudication: Secondary | ICD-10-CM

## 2020-05-07 LAB — BASIC METABOLIC PANEL
BUN: 14 (ref 4–21)
CO2: 30 — AB (ref 13–22)
Chloride: 103 (ref 99–108)
Creatinine: 1.1 (ref 0.6–1.3)
Glucose: 100
Potassium: 4.3 (ref 3.4–5.3)
Sodium: 140 (ref 137–147)

## 2020-05-07 LAB — LIPID PANEL
Cholesterol: 151 (ref 0–200)
HDL: 48 (ref 35–70)
LDL Cholesterol: 86
Triglycerides: 88 (ref 40–160)

## 2020-05-07 LAB — MICROALBUMIN, URINE: Microalb, Ur: <0.7

## 2020-05-07 LAB — PSA: PSA: 2.84

## 2020-05-07 LAB — HEPATIC FUNCTION PANEL
ALT: 33 (ref 10–40)
AST: 24 (ref 14–40)
Alkaline Phosphatase: 46 (ref 25–125)

## 2020-05-07 LAB — VITAMIN D 25 HYDROXY (VIT D DEFICIENCY, FRACTURES): Vit D, 25-Hydroxy: 90.6

## 2020-05-07 LAB — COMPREHENSIVE METABOLIC PANEL
Albumin: 4.4 (ref 3.5–5.0)
Calcium: 10 (ref 8.7–10.7)
GFR calc Af Amer: 86
GFR calc non Af Amer: 71

## 2020-05-16 ENCOUNTER — Other Ambulatory Visit (INDEPENDENT_AMBULATORY_CARE_PROVIDER_SITE_OTHER): Payer: Self-pay | Admitting: Family Medicine

## 2020-05-16 DIAGNOSIS — F3289 Other specified depressive episodes: Secondary | ICD-10-CM

## 2020-05-19 ENCOUNTER — Ambulatory Visit (INDEPENDENT_AMBULATORY_CARE_PROVIDER_SITE_OTHER): Payer: 59 | Admitting: Psychology

## 2020-05-19 DIAGNOSIS — F4321 Adjustment disorder with depressed mood: Secondary | ICD-10-CM | POA: Diagnosis not present

## 2020-05-21 ENCOUNTER — Other Ambulatory Visit: Payer: Self-pay

## 2020-05-21 ENCOUNTER — Encounter (INDEPENDENT_AMBULATORY_CARE_PROVIDER_SITE_OTHER): Payer: Self-pay

## 2020-05-21 ENCOUNTER — Encounter (INDEPENDENT_AMBULATORY_CARE_PROVIDER_SITE_OTHER): Payer: Self-pay | Admitting: Family Medicine

## 2020-05-21 ENCOUNTER — Ambulatory Visit (INDEPENDENT_AMBULATORY_CARE_PROVIDER_SITE_OTHER): Payer: 59 | Admitting: Family Medicine

## 2020-05-21 VITALS — BP 136/89 | HR 71 | Temp 98.0°F | Ht 69.0 in | Wt 275.0 lb

## 2020-05-21 DIAGNOSIS — F3289 Other specified depressive episodes: Secondary | ICD-10-CM | POA: Diagnosis not present

## 2020-05-21 DIAGNOSIS — Z9189 Other specified personal risk factors, not elsewhere classified: Secondary | ICD-10-CM

## 2020-05-21 DIAGNOSIS — Z6841 Body Mass Index (BMI) 40.0 and over, adult: Secondary | ICD-10-CM

## 2020-05-21 MED ORDER — BUPROPION HCL ER (SR) 200 MG PO TB12: 200.0000 mg | ORAL_TABLET | Freq: Two times a day (BID) | ORAL | 0 refills | Status: DC

## 2020-05-27 NOTE — Progress Notes (Signed)
Chief Complaint:   OBESITY [name] is here to discuss his progress with his obesity treatment plan along with follow-up of his obesity related diagnoses. [name] is on the Category 4 Plan and states he is following his eating plan approximately 75% of the time. [name] states he is walking for 45 minutes 3 times per week.  Today's visit was #: 21 Starting weight: 308 lbs Starting date: 04/10/2019 Today's weight: 275 lbs Today's date: 05/21/2020 Total lbs lost to date: 33 Total lbs lost since last in-office visit: 0  Interim History: Kenneth Green has struggled a bit more with meal planning, and he has had to do a bit more grab and go eating. His back has worsened although he is still walking, and he may be needing surgery for spinal stenosis.  Subjective:   1. Other depression, with emotional eating Kenneth Green is stable on Wellbutrin, and his blood pressure is controlled. He denies palpitations.  2. At high risk for fluid overload [name] is at a higher than average risk for fluid retention due to recent fluid retention.  Assessment/Plan:   1. Other depression, with emotional eating Behavior modification techniques were discussed today to help Kenneth Green deal with his emotional/non-hunger eating behaviors. We will refill Wellbutrin SR for 1 month. Orders and follow up as documented in patient record.   - buPROPion (WELLBUTRIN SR) 200 MG 12 hr tablet; Take 1 tablet (200 mg total) by mouth 2 (two) times daily.  Dispense: 60 tablet; Refill: 0  2. At high risk for fluid overload Kenneth Green was given approximately 15 minutes of fluid retention prevention counseling today. He is 54 y.o. male and has risk factors for fluid retention including obesity. We discussed intensive lifestyle modifications today with an emphasis on specific weight loss instructions, proper nutrition and exercise strategies.   Repetitive spaced learning was employed today to elicit superior memory formation and behavioral  change.  3. Class 3 severe obesity with serious comorbidity and body mass index (BMI) of 40.0 to 44.9 in adult, unspecified obesity type [facility]) Kenneth Green is currently in the action stage of change. As such, his goal is to continue with weight loss efforts. He has agreed to the Category 4 Plan and keeping a food journal and adhering to recommended goals of 450-650 calories and 45+ grams of protein at supper daily.   Exercise goals: As is.  Behavioral modification strategies: increasing lean protein intake.  Kenneth Green has agreed to follow-up with our clinic in 3 weeks. He was informed of the importance of frequent follow-up visits to maximize his success with intensive lifestyle modifications for his multiple health conditions.   Objective:   Blood pressure 136/89, pulse 71, temperature 98 F (36.7 C), temperature source Oral, height 5\' 9"  (1.753 m), weight 275 lb (124.7 kg), SpO2 98 %. Body mass index is 40.61 kg/m.  General: Cooperative, alert, well developed, in no acute distress. HEENT: Conjunctivae and lids unremarkable. Cardiovascular: Regular rhythm.  Lungs: Normal work of breathing. Neurologic: No focal deficits.   Lab Results  Component Value Date   CREATININE 1.1 05/07/2020   BUN 14 05/07/2020   NA 140 05/07/2020   K 4.3 05/07/2020   CL 103 05/07/2020   CO2 30 (A) 05/07/2020   Lab Results  Component Value Date   ALT 33 05/07/2020   AST 24 05/07/2020   ALKPHOS 46 05/07/2020   BILITOT 0.6 12/19/2019   Lab Results  Component Value Date   HGBA1C 5.6 12/19/2019   HGBA1C 5.7 (H) 09/03/2019  HGBA1C 5.7 05/07/2019   HGBA1C 5.9 (H) 04/10/2019   Lab Results  Component Value Date   INSULIN 27.0 (H) 12/19/2019   INSULIN 24.2 09/03/2019   INSULIN 30.9 (H) 04/10/2019   Lab Results  Component Value Date   TSH 1.340 04/10/2019   Lab Results  Component Value Date   CHOL 151 05/07/2020   HDL 48 05/07/2020   LDLCALC 86 05/07/2020   TRIG 88 05/07/2020   Lab Results    Component Value Date   WBC 6.0 04/10/2019   HGB 13.4 04/10/2019   HCT 39.2 04/10/2019   MCV 82 04/10/2019   PLT 235 06/15/2016   No results found for: IRON, TIBC, FERRITIN  Attestation Statements:   Reviewed by clinician on day of visit: allergies, medications, problem list, medical history, surgical history, family history, social history, and previous encounter notes.   I, Trixie Dredge, am acting as transcriptionist for Dennard Nip, MD.  I have reviewed the above documentation for accuracy and completeness, and I agree with the above. -  Dennard Nip, MD

## 2020-06-06 ENCOUNTER — Ambulatory Visit
Admission: RE | Admit: 2020-06-06 | Discharge: 2020-06-06 | Disposition: A | Discharge status: Home / Self Care | Payer: 59 | Source: Ambulatory Visit | Attending: Orthopaedic Surgery | Admitting: Orthopaedic Surgery

## 2020-06-06 ENCOUNTER — Other Ambulatory Visit: Payer: Self-pay

## 2020-06-06 DIAGNOSIS — M48062 Spinal stenosis, lumbar region with neurogenic claudication: Secondary | ICD-10-CM

## 2020-06-09 ENCOUNTER — Ambulatory Visit (INDEPENDENT_AMBULATORY_CARE_PROVIDER_SITE_OTHER): Payer: 59 | Admitting: Psychology

## 2020-06-09 DIAGNOSIS — F4321 Adjustment disorder with depressed mood: Secondary | ICD-10-CM | POA: Diagnosis not present

## 2020-06-12 ENCOUNTER — Other Ambulatory Visit: Payer: Self-pay

## 2020-06-12 ENCOUNTER — Encounter (INDEPENDENT_AMBULATORY_CARE_PROVIDER_SITE_OTHER): Payer: Self-pay | Admitting: Family Medicine

## 2020-06-12 ENCOUNTER — Ambulatory Visit (INDEPENDENT_AMBULATORY_CARE_PROVIDER_SITE_OTHER): Payer: 59 | Admitting: Family Medicine

## 2020-06-12 VITALS — BP 116/79 | HR 95 | Temp 97.5°F | Ht 69.0 in | Wt 276.0 lb

## 2020-06-12 DIAGNOSIS — Z9189 Other specified personal risk factors, not elsewhere classified: Secondary | ICD-10-CM

## 2020-06-12 DIAGNOSIS — F3289 Other specified depressive episodes: Secondary | ICD-10-CM

## 2020-06-12 DIAGNOSIS — I10 Essential (primary) hypertension: Secondary | ICD-10-CM

## 2020-06-12 DIAGNOSIS — Z6841 Body Mass Index (BMI) 40.0 and over, adult: Secondary | ICD-10-CM

## 2020-06-12 MED ORDER — BUPROPION HCL ER (SR) 200 MG PO TB12: 200.0000 mg | ORAL_TABLET | Freq: Two times a day (BID) | ORAL | 0 refills | Status: DC

## 2020-06-16 NOTE — Progress Notes (Signed)
Chief Complaint:   OBESITY Kenneth Green is here to discuss his progress with his obesity treatment plan along with follow-up of his obesity related diagnoses. Kenneth Green is on the Category 4 Plan and keeping a food journal and adhering to recommended goals of 450-650 calories and 45+ grams of protein at supper daily and states he is following his eating plan approximately 60% of the time. Kenneth Green states he is doing 0 minutes 0 times per week.  Today's visit was #: 22 Starting weight: 308 lbs Starting date: 04/10/2019 Today's weight: 276 lbs Today's date: 06/12/2020 Total lbs lost to date: 32 Total lbs lost since last in-office visit: 0  Interim History: Kenneth Green has been off track with his plan in the last week or so since his mother has been hospitalized, and he has been unable to meal plan as well. He did work on increasing protein and he was overall mindful of his food choices. He is ready to get back on track.  Subjective:   1. Essential hypertension Kenneth Green's blood pressure continues to improve with diet and weight loss. He denies chest pain or feeling lightheaded.  2. Other depression with emotional eating Kenneth Green is stable on his medications, and he is doing well minimizing emotional eating even with increased stress recently.  3. At risk for complication associated with hypotension The patient is at a higher than average risk of hypotension due to low blood pressure.  Assessment/Plan:   1. Essential hypertension Kenneth Green is working on healthy weight loss, diet, and exercise to improve blood pressure control. We will watch for signs of hypotension as he continues his lifestyle modifications and weight loss efforts.   2. Other depression with emotional eating Behavior modification techniques were discussed today to help Kenneth Green deal with his emotional/non-hunger eating behaviors. We will refill Wellbutrin SR for 1 month. Orders and follow up as documented in patient record.   -  buPROPion (WELLBUTRIN SR) 200 MG 12 hr tablet; Take 1 tablet (200 mg total) by mouth 2 (two) times daily.  Dispense: 60 tablet; Refill: 0  3. At risk for complication associated with hypotension Kenneth Green was given approximately 15 minutes of education and counseling today to help avoid hypotension. We discussed risks of hypotension with weight loss and signs of hypotension such as feeling lightheaded or unsteady.  Repetitive spaced learning was employed today to elicit superior memory formation and behavioral change.  4. Class 3 severe obesity with serious comorbidity and body mass index (BMI) of 40.0 to 44.9 in adult, unspecified obesity type Kenneth Green) Kenneth Green is currently in the action stage of change. As such, his goal is to continue with weight loss efforts. He has agreed to the Category 4 Plan.   Behavioral modification strategies: emotional eating strategies.  Kenneth Green has agreed to follow-up with our clinic in 3 weeks. He was informed of the importance of frequent follow-up visits to maximize his success with intensive lifestyle modifications for his multiple health conditions.   Objective:   Blood pressure 116/79, pulse 95, temperature (!) 97.5 F (36.4 C), temperature source Oral, height 5\' 9"  (1.753 m), weight (!) 276 lb (125.2 kg), SpO2 98 %. Body mass index is 40.76 kg/m.  General: Cooperative, alert, well developed, in no acute distress. HEENT: Conjunctivae and lids unremarkable. Cardiovascular: Regular rhythm.  Lungs: Normal work of breathing. Neurologic: No focal deficits.   Lab Results  Component Value Date   CREATININE 1.1 05/07/2020   BUN 14 05/07/2020   NA 140 05/07/2020   K  4.3 05/07/2020   CL 103 05/07/2020   CO2 30 (A) 05/07/2020   Lab Results  Component Value Date   ALT 33 05/07/2020   AST 24 05/07/2020   ALKPHOS 46 05/07/2020   BILITOT 0.6 12/19/2019   Lab Results  Component Value Date   HGBA1C 5.6 12/19/2019   HGBA1C 5.7 (H) 09/03/2019   HGBA1C 5.7  05/07/2019   HGBA1C 5.9 (H) 04/10/2019   Lab Results  Component Value Date   INSULIN 27.0 (H) 12/19/2019   INSULIN 24.2 09/03/2019   INSULIN 30.9 (H) 04/10/2019   Lab Results  Component Value Date   TSH 1.340 04/10/2019   Lab Results  Component Value Date   CHOL 151 05/07/2020   HDL 48 05/07/2020   LDLCALC 86 05/07/2020   TRIG 88 05/07/2020   Lab Results  Component Value Date   WBC 6.0 04/10/2019   HGB 13.4 04/10/2019   HCT 39.2 04/10/2019   MCV 82 04/10/2019   PLT 235 06/15/2016   No results found for: IRON, TIBC, FERRITIN  Attestation Statements:   Reviewed by clinician on day of visit: allergies, medications, problem list, medical history, surgical history, family history, social history, and previous encounter notes.   I, Kenneth Green, am acting as transcriptionist for Kenneth Nip, MD.  I have reviewed the above documentation for accuracy and completeness, and I agree with the above. -  Kenneth Nip, MD

## 2020-06-30 ENCOUNTER — Ambulatory Visit (INDEPENDENT_AMBULATORY_CARE_PROVIDER_SITE_OTHER): Payer: 59 | Admitting: Psychology

## 2020-06-30 DIAGNOSIS — F4321 Adjustment disorder with depressed mood: Secondary | ICD-10-CM | POA: Diagnosis not present

## 2020-07-02 ENCOUNTER — Ambulatory Visit (INDEPENDENT_AMBULATORY_CARE_PROVIDER_SITE_OTHER): Payer: 59 | Admitting: Family Medicine

## 2020-07-02 ENCOUNTER — Other Ambulatory Visit: Payer: Self-pay

## 2020-07-02 ENCOUNTER — Other Ambulatory Visit (INDEPENDENT_AMBULATORY_CARE_PROVIDER_SITE_OTHER): Payer: Self-pay | Admitting: Family Medicine

## 2020-07-02 VITALS — BP 149/93 | HR 64 | Temp 97.5°F | Ht 69.0 in | Wt 279.0 lb

## 2020-07-02 DIAGNOSIS — Z9189 Other specified personal risk factors, not elsewhere classified: Secondary | ICD-10-CM

## 2020-07-02 DIAGNOSIS — F3289 Other specified depressive episodes: Secondary | ICD-10-CM | POA: Diagnosis not present

## 2020-07-02 DIAGNOSIS — I10 Essential (primary) hypertension: Secondary | ICD-10-CM | POA: Diagnosis not present

## 2020-07-02 DIAGNOSIS — Z6841 Body Mass Index (BMI) 40.0 and over, adult: Secondary | ICD-10-CM

## 2020-07-02 MED ORDER — WEGOVY 0.5 MG/0.5ML ~~LOC~~ SOAJ: 0.5000 mg | SUBCUTANEOUS | 0 refills | Status: DC

## 2020-07-03 NOTE — Progress Notes (Signed)
Chief Complaint:   OBESITY Kenneth Green is here to discuss his progress with his obesity treatment plan along with follow-up of his obesity related diagnoses. Kenneth Green is on the Category 4 Plan and states he is following his eating plan approximately 75% of the time. Kenneth Green states he is doing 0 minutes 0 times per week.  Today's visit was #: 23 Starting weight: 308 lbs Starting date: 04/10/2019 Today's weight: 279 lbs Today's date: 07/02/2020 Total lbs lost to date: 35 Total lbs lost since last in-office visit: 0  Interim History: Kenneth Green has been struggling to stay on track recently. He notes increased stress and notes some stress eating to the point of binging.  Subjective:   1. Essential hypertension Kenneth Green's blood pressure is elevated today, normally well controlled. His knee pain has increased and he has had some traffic stress.  2. Other depression with emotional eating Kenneth Green is stable on Wellbutrin, and he notes increased stress and increased stress eating in the last month.  3. At risk for dehydration Kenneth Green is at risk for dehydration due to inadequate water intake.  Assessment/Plan:   1. Essential hypertension Ladarrion will continue his medications, and will continue working on diet, healthy weight loss and exercise to improve blood pressure control. We will watch for signs of hypotension as he continues his lifestyle modifications. We will recheck labs in 3 months.  2. Other depression with emotional eating Emotional eating strategies were discussed today to help Kenneth Green deal with his emotional/non-hunger eating behaviors. Kenneth Green will continue Wellbutrin as is. Orders and follow up as documented in patient record.   3. At risk for dehydration Kenneth Green was given approximately 15 minutes dehydration prevention counseling today. Kenneth Green is at risk for dehydration due to weight loss and current medication(s). He was encouraged to hydrate and monitor fluid status to avoid  dehydration as well as weight loss plateaus.   4. Class 3 severe obesity with serious comorbidity and body mass index (BMI) of 40.0 to 44.9 in adult, unspecified obesity type Kenneth Green) Kenneth Green is currently in the action stage of change. As such, his goal is to continue with weight loss efforts. He has agreed to the Category 4 Plan.   We discussed various medication options to help Kenneth Green with his weight loss efforts and we both agreed to start Wegovy 0.5 mg SubQ weekly with no refills. Co-pay coupon was given today.  - Semaglutide-Weight Management (WEGOVY) 0.5 MG/0.5ML SOAJ; Inject 0.5 mg into the skin every 7 (seven) days.  Dispense: 2 mL; Refill: 0  Behavioral modification strategies: increasing water intake.  Kenneth Green has agreed to follow-up with our clinic in 3 weeks. He was informed of the importance of frequent follow-up visits to maximize his success with intensive lifestyle modifications for his multiple health conditions.   Objective:   Blood pressure (!) 149/93, pulse 64, temperature (!) 97.5 F (36.4 C), temperature source Oral, height 5\' 9"  (1.753 m), weight 279 lb (126.6 kg), SpO2 98 %. Body mass index is 41.2 kg/m.  General: Cooperative, alert, well developed, in no acute distress. HEENT: Conjunctivae and lids unremarkable. Cardiovascular: Regular rhythm.  Lungs: Normal work of breathing. Neurologic: No focal deficits.   Lab Results  Component Value Date   CREATININE 1.1 05/07/2020   BUN 14 05/07/2020   NA 140 05/07/2020   K 4.3 05/07/2020   CL 103 05/07/2020   CO2 30 (A) 05/07/2020   Lab Results  Component Value Date   ALT 33 05/07/2020   AST 24 05/07/2020  ALKPHOS 46 05/07/2020   BILITOT 0.6 12/19/2019   Lab Results  Component Value Date   HGBA1C 5.6 12/19/2019   HGBA1C 5.7 (H) 09/03/2019   HGBA1C 5.7 05/07/2019   HGBA1C 5.9 (H) 04/10/2019   Lab Results  Component Value Date   INSULIN 27.0 (H) 12/19/2019   INSULIN 24.2 09/03/2019   INSULIN 30.9 (H)  04/10/2019   Lab Results  Component Value Date   TSH 1.340 04/10/2019   Lab Results  Component Value Date   CHOL 151 05/07/2020   HDL 48 05/07/2020   LDLCALC 86 05/07/2020   TRIG 88 05/07/2020   Lab Results  Component Value Date   WBC 6.0 04/10/2019   HGB 13.4 04/10/2019   HCT 39.2 04/10/2019   MCV 82 04/10/2019   PLT 235 06/15/2016   No results found for: IRON, TIBC, FERRITIN  Attestation Statements:   Reviewed by clinician on day of visit: allergies, medications, problem list, medical history, surgical history, family history, social history, and previous encounter notes.   I, Trixie Dredge, am acting as transcriptionist for Dennard Nip, MD.  I have reviewed the above documentation for accuracy and completeness, and I agree with the above. -  Dennard Nip, MD

## 2020-07-13 ENCOUNTER — Other Ambulatory Visit (INDEPENDENT_AMBULATORY_CARE_PROVIDER_SITE_OTHER): Payer: Self-pay | Admitting: Family Medicine

## 2020-07-13 DIAGNOSIS — F3289 Other specified depressive episodes: Secondary | ICD-10-CM

## 2020-07-14 ENCOUNTER — Encounter (INDEPENDENT_AMBULATORY_CARE_PROVIDER_SITE_OTHER): Payer: Self-pay

## 2020-07-14 NOTE — Telephone Encounter (Signed)
My chart message sent to pt.

## 2020-07-18 ENCOUNTER — Other Ambulatory Visit (INDEPENDENT_AMBULATORY_CARE_PROVIDER_SITE_OTHER): Payer: Self-pay | Admitting: Family Medicine

## 2020-07-18 DIAGNOSIS — F3289 Other specified depressive episodes: Secondary | ICD-10-CM

## 2020-07-21 ENCOUNTER — Ambulatory Visit (INDEPENDENT_AMBULATORY_CARE_PROVIDER_SITE_OTHER): Payer: 59 | Admitting: Psychology

## 2020-07-21 DIAGNOSIS — F4321 Adjustment disorder with depressed mood: Secondary | ICD-10-CM | POA: Diagnosis not present

## 2020-07-23 ENCOUNTER — Encounter (INDEPENDENT_AMBULATORY_CARE_PROVIDER_SITE_OTHER): Payer: Self-pay | Admitting: Family Medicine

## 2020-07-23 ENCOUNTER — Ambulatory Visit (INDEPENDENT_AMBULATORY_CARE_PROVIDER_SITE_OTHER): Payer: 59 | Admitting: Family Medicine

## 2020-07-23 ENCOUNTER — Other Ambulatory Visit: Payer: Self-pay

## 2020-07-23 VITALS — BP 130/84 | HR 78 | Temp 97.5°F | Ht 69.0 in | Wt 273.0 lb

## 2020-07-23 DIAGNOSIS — F3289 Other specified depressive episodes: Secondary | ICD-10-CM

## 2020-07-23 DIAGNOSIS — E559 Vitamin D deficiency, unspecified: Secondary | ICD-10-CM

## 2020-07-23 DIAGNOSIS — Z6841 Body Mass Index (BMI) 40.0 and over, adult: Secondary | ICD-10-CM

## 2020-07-23 DIAGNOSIS — R7303 Prediabetes: Secondary | ICD-10-CM

## 2020-07-23 DIAGNOSIS — Z9189 Other specified personal risk factors, not elsewhere classified: Secondary | ICD-10-CM | POA: Diagnosis not present

## 2020-07-23 MED ORDER — BUPROPION HCL ER (SR) 200 MG PO TB12: 200.0000 mg | ORAL_TABLET | Freq: Two times a day (BID) | ORAL | 0 refills | Status: DC

## 2020-07-24 LAB — COMPREHENSIVE METABOLIC PANEL
ALT: 36 IU/L (ref 0–44)
AST: 31 IU/L (ref 0–40)
Albumin/Globulin Ratio: 2 (ref 1.2–2.2)
Albumin: 4.7 g/dL (ref 3.8–4.9)
Alkaline Phosphatase: 53 IU/L (ref 48–121)
BUN/Creatinine Ratio: 19 (ref 9–20)
BUN: 20 mg/dL (ref 6–24)
Bilirubin Total: 0.4 mg/dL (ref 0.0–1.2)
CO2: 24 mmol/L (ref 20–29)
Calcium: 9.6 mg/dL (ref 8.7–10.2)
Chloride: 100 mmol/L (ref 96–106)
Creatinine, Ser: 1.08 mg/dL (ref 0.76–1.27)
GFR calc Af Amer: 89 mL/min/{1.73_m2} (ref >59)
GFR calc non Af Amer: 77 mL/min/{1.73_m2} (ref >59)
Globulin, Total: 2.4 g/dL (ref 1.5–4.5)
Glucose: 88 mg/dL (ref 65–99)
Potassium: 3.7 mmol/L (ref 3.5–5.2)
Sodium: 137 mmol/L (ref 134–144)
Total Protein: 7.1 g/dL (ref 6.0–8.5)

## 2020-07-24 LAB — INSULIN, RANDOM: INSULIN: 18.1 u[IU]/mL (ref 2.6–24.9)

## 2020-07-24 LAB — VITAMIN D 25 HYDROXY (VIT D DEFICIENCY, FRACTURES): Vit D, 25-Hydroxy: 44.4 ng/mL (ref 30.0–100.0)

## 2020-07-24 LAB — HEMOGLOBIN A1C
Est. average glucose Bld gHb Est-mCnc: 120 mg/dL
Hgb A1c MFr Bld: 5.8 % — ABNORMAL HIGH (ref 4.8–5.6)

## 2020-07-29 DIAGNOSIS — R7303 Prediabetes: Secondary | ICD-10-CM | POA: Insufficient documentation

## 2020-07-29 DIAGNOSIS — E559 Vitamin D deficiency, unspecified: Secondary | ICD-10-CM | POA: Insufficient documentation

## 2020-07-29 DIAGNOSIS — Z6841 Body Mass Index (BMI) 40.0 and over, adult: Secondary | ICD-10-CM | POA: Insufficient documentation

## 2020-07-29 NOTE — Progress Notes (Signed)
Chief Complaint:   OBESITY Kenneth Green is here to discuss his progress with his obesity treatment plan along with follow-up of his obesity related diagnoses. Kenneth Green is on the Category 4 Plan and states he is following his eating plan approximately 75% of the time. Kenneth Green states he is walking 8,000 steps daily.   Today's visit was #: 24 Starting weight: 308 lbs Starting date: 04/10/2019 Today's weight: 273 lbs Today's date: 07/23/2020 Total lbs lost to date: 35 Total lbs lost since last in-office visit: 6  Interim History: Kenneth Green continues to do very well with weight loss on his Category 4 plan. He has decreased snacking at work. He was unable to start [facility] but he is doing well without it.  Subjective:   1. Pre-diabetes Kenneth Green is working on diet, and he is due for labs to check his progress.  2. Vitamin D deficiency Kenneth Green is on Vit D, and he is due for labs. He denies nausea or vomiting.  3. Other depression with emotional eating Kenneth Green is stable on Wellbutrin, and he requests a refill today. He feels he is doing better controlling his emotional eating.  4. At risk for impaired metabolic function Kenneth Green is at increased risk for impaired metabolic function due to decreased sleep.  Assessment/Plan:   1. Pre-diabetes Kenneth Green will continue to work on weight loss, diet, exercise, and decreasing simple carbohydrates to help decrease the risk of diabetes. We will check labs today.  - Hemoglobin A1c - Insulin, random - Comprehensive metabolic panel  2. Vitamin D deficiency Low Vitamin D level contributes to fatigue and are associated with obesity, breast, and colon cancer. We will check labs today, and Kenneth Green will follow-up for routine testing of Vitamin D, at least 2-3 times per year to avoid over-replacement.  - VITAMIN D 25 Hydroxy (Vit-D Deficiency, Fractures)  3. Other depression with emotional eating Behavior modification techniques were discussed today to help  Kenneth Green deal with his emotional/non-hunger eating behaviors. We will refill Wellbutrin SR for 1 month. Orders and follow up as documented in patient record.   - buPROPion (WELLBUTRIN SR) 200 MG 12 hr tablet; Take 1 tablet (200 mg total) by mouth 2 (two) times daily.  Dispense: 60 tablet; Refill: 0  4. At risk for impaired metabolic function Kenneth Green was given approximately 15 minutes of impaired  metabolic function prevention counseling today. We discussed intensive lifestyle modifications today with an emphasis on specific nutrition and exercise instructions and strategies.   Repetitive spaced learning was employed today to elicit superior memory formation and behavioral change.  5. Class 3 severe obesity with serious comorbidity and body mass index (BMI) of 40.0 to 44.9 in adult, unspecified obesity type Kenneth Green) Kenneth Green is currently in the action stage of change. As such, his goal is to continue with weight loss efforts. He has agreed to the Category 4 Plan.   We discussed various medication options to help Kenneth Green with his weight loss efforts and we both agreed to discontinue Wegovy.  Exercise goals: As is.  Behavioral modification strategies: increasing lean protein intake.  Kenneth Green has agreed to follow-up with our clinic in 3 to 4 weeks. He was informed of the importance of frequent follow-up visits to maximize his success with intensive lifestyle modifications for his multiple health conditions.   Kenneth Green was informed we would discuss his lab results at his next visit unless there is a critical issue that needs to be addressed sooner. Kenneth Green agreed to keep his next visit at the agreed  upon time to discuss these results.  Objective:   Blood pressure 130/84, pulse 78, temperature (!) 97.5 F (36.4 C), height 5\' 9"  (1.753 m), weight 273 lb (123.8 kg), SpO2 97 %. Body mass index is 40.32 kg/m.  General: Cooperative, alert, well developed, in no acute distress. HEENT: Conjunctivae and  lids unremarkable. Cardiovascular: Regular rhythm.  Lungs: Normal work of breathing. Neurologic: No focal deficits.   Lab Results  Component Value Date   CREATININE 1.08 07/23/2020   BUN 20 07/23/2020   NA 137 07/23/2020   K 3.7 07/23/2020   CL 100 07/23/2020   CO2 24 07/23/2020   Lab Results  Component Value Date   ALT 36 07/23/2020   AST 31 07/23/2020   ALKPHOS 53 07/23/2020   BILITOT 0.4 07/23/2020   Lab Results  Component Value Date   HGBA1C 5.8 (H) 07/23/2020   HGBA1C 5.6 12/19/2019   HGBA1C 5.7 (H) 09/03/2019   HGBA1C 5.7 05/07/2019   HGBA1C 5.9 (H) 04/10/2019   Lab Results  Component Value Date   INSULIN 18.1 07/23/2020   INSULIN 27.0 (H) 12/19/2019   INSULIN 24.2 09/03/2019   INSULIN 30.9 (H) 04/10/2019   Lab Results  Component Value Date   TSH 1.340 04/10/2019   Lab Results  Component Value Date   CHOL 151 05/07/2020   HDL 48 05/07/2020   LDLCALC 86 05/07/2020   TRIG 88 05/07/2020   Lab Results  Component Value Date   WBC 6.0 04/10/2019   HGB 13.4 04/10/2019   HCT 39.2 04/10/2019   MCV 82 04/10/2019   PLT 235 06/15/2016   No results found for: IRON, TIBC, FERRITIN  Attestation Statements:   Reviewed by clinician on day of visit: allergies, medications, problem list, medical history, surgical history, family history, social history, and previous encounter notes.   I, Trixie Dredge, am acting as transcriptionist for Dennard Nip, MD.  I have reviewed the above documentation for accuracy and completeness, and I agree with the above. -  Dennard Nip, MD  .

## 2020-08-12 ENCOUNTER — Encounter (INDEPENDENT_AMBULATORY_CARE_PROVIDER_SITE_OTHER): Payer: Self-pay | Admitting: Family Medicine

## 2020-08-12 ENCOUNTER — Ambulatory Visit (INDEPENDENT_AMBULATORY_CARE_PROVIDER_SITE_OTHER): Payer: 59 | Admitting: Family Medicine

## 2020-08-12 ENCOUNTER — Other Ambulatory Visit: Payer: Self-pay

## 2020-08-12 VITALS — BP 129/85 | HR 80 | Temp 98.2°F | Ht 69.0 in | Wt 282.0 lb

## 2020-08-12 DIAGNOSIS — I10 Essential (primary) hypertension: Secondary | ICD-10-CM

## 2020-08-12 DIAGNOSIS — Z9189 Other specified personal risk factors, not elsewhere classified: Secondary | ICD-10-CM

## 2020-08-12 DIAGNOSIS — F3289 Other specified depressive episodes: Secondary | ICD-10-CM

## 2020-08-12 DIAGNOSIS — Z6841 Body Mass Index (BMI) 40.0 and over, adult: Secondary | ICD-10-CM

## 2020-08-12 MED ORDER — BUPROPION HCL ER (SR) 200 MG PO TB12: 200.0000 mg | ORAL_TABLET | Freq: Two times a day (BID) | ORAL | 0 refills | Status: DC

## 2020-08-13 NOTE — Progress Notes (Signed)
Chief Complaint:   OBESITY Kenneth Green is here to discuss his progress with his obesity treatment plan along with follow-up of his obesity related diagnoses. Kenneth Green is on the Category 4 Plan and states he is following his eating plan approximately 60% of the time. Kenneth Green states he is walking for 30-40 minutes 7 times per week.  Today's visit was #: 25 Starting weight: 308 lbs Starting date: 04/10/2019 Today's weight: 282 lbs Today's date: 08/12/2020 Total lbs lost to date: 26 Total lbs lost since last in-office visit: 0  Interim History: Kenneth Green was traveling and did some celebrations eating and is retaining some fluid today. This is likely due to increased sodium and increased simple carbohydrates. He is ready to get back on track.  Subjective:   1. Essential hypertension Kenneth Green's blood pressure is well controlled on his medications. He denies chest pain but he is retaining some water weight today.  2. Other depression with emotional eating Kenneth Green is stable on Wellbutrin, and his mood is good. He feels ready to get back on track with his weight loss efforts.  3. At high risk for fluid overload Kenneth Green is at a higher than average risk for fluid retention due to obesity. Reviewed: no chest pain on exertion, no dyspnea at rest, and no swelling of ankles.  Assessment/Plan:   1. Essential hypertension Kenneth Green is working on healthy weight loss and exercise to improve blood pressure control. We will watch for signs of hypotension as he continues his lifestyle modifications. Kenneth Green will continue his medications, and change to a lower carbohydrate diet and increase water intake.  2. Other depression with emotional eating Behavior modification techniques were discussed today to help [name] deal with his emotional/non-hunger eating behaviors. We will refill Wellbutrin SR for 1 month. Orders and follow up as documented in patient record.   - buPROPion (WELLBUTRIN SR) 200 MG 12 hr  tablet; Take 1 tablet (200 mg total) by mouth 2 (two) times daily.  Dispense: 60 tablet; Refill: 0  3. At high risk for fluid overload [name] was given approximately 15 minutes of fluid retention prevention counseling today. He is 54 y.o. male and has risk factors for fluid retention including obesity. We discussed intensive lifestyle modifications today with an emphasis on specific weight loss instructions, proper nutrition and exercise strategies.   Repetitive spaced learning was employed today to elicit superior memory formation and behavioral change.  4. Class 3 severe obesity with serious comorbidity and body mass index (BMI) of 40.0 to 44.9 in adult, unspecified obesity type Kenneth Green) Kenneth Green is currently in the action stage of change. As such, his goal is to continue with weight loss efforts. He has agreed to change to following a lower carbohydrate, vegetable and lean protein rich diet plan.   Exercise goals: As is.  Behavioral modification strategies: increasing lean protein intake, decreasing simple carbohydrates and meal planning and cooking strategies.  Kenneth Green has agreed to follow-up with our clinic in 3 weeks. He was informed of the importance of frequent follow-up visits to maximize his success with intensive lifestyle modifications for his multiple health conditions.   Objective:   Blood pressure 129/85, pulse 80, temperature 98.2 F (36.8 C), height 5\' 9"  (1.753 m), weight 282 lb (127.9 kg), SpO2 98 %. Body mass index is 41.64 kg/m.  General: Cooperative, alert, well developed, in no acute distress. HEENT: Conjunctivae and lids unremarkable. Cardiovascular: Regular rhythm.  Lungs: Normal work of breathing. Neurologic: No focal deficits.   Lab Results  Component  Value Date   CREATININE 1.08 07/23/2020   BUN 20 07/23/2020   NA 137 07/23/2020   K 3.7 07/23/2020   CL 100 07/23/2020   CO2 24 07/23/2020   Lab Results  Component Value Date   ALT 36 07/23/2020   AST 31  07/23/2020   ALKPHOS 53 07/23/2020   BILITOT 0.4 07/23/2020   Lab Results  Component Value Date   HGBA1C 5.8 (H) 07/23/2020   HGBA1C 5.6 12/19/2019   HGBA1C 5.7 (H) 09/03/2019   HGBA1C 5.7 05/07/2019   HGBA1C 5.9 (H) 04/10/2019   Lab Results  Component Value Date   INSULIN 18.1 07/23/2020   INSULIN 27.0 (H) 12/19/2019   INSULIN 24.2 09/03/2019   INSULIN 30.9 (H) 04/10/2019   Lab Results  Component Value Date   TSH 1.340 04/10/2019   Lab Results  Component Value Date   CHOL 151 05/07/2020   HDL 48 05/07/2020   LDLCALC 86 05/07/2020   TRIG 88 05/07/2020   Lab Results  Component Value Date   WBC 6.0 04/10/2019   HGB 13.4 04/10/2019   HCT 39.2 04/10/2019   MCV 82 04/10/2019   PLT 235 06/15/2016   No results found for: IRON, TIBC, FERRITIN  Attestation Statements:   Reviewed by clinician on day of visit: allergies, medications, problem list, medical history, surgical history, family history, social history, and previous encounter notes.   I, Trixie Dredge, am acting as transcriptionist for Dennard Nip, MD.  I have reviewed the above documentation for accuracy and completeness, and I agree with the above. -  Dennard Nip, MD

## 2020-08-19 ENCOUNTER — Ambulatory Visit: Payer: 59 | Admitting: Psychology

## 2020-08-26 ENCOUNTER — Ambulatory Visit (INDEPENDENT_AMBULATORY_CARE_PROVIDER_SITE_OTHER): Payer: 59 | Admitting: Psychology

## 2020-08-26 DIAGNOSIS — F4321 Adjustment disorder with depressed mood: Secondary | ICD-10-CM

## 2020-09-02 ENCOUNTER — Other Ambulatory Visit: Payer: Self-pay

## 2020-09-02 ENCOUNTER — Encounter (INDEPENDENT_AMBULATORY_CARE_PROVIDER_SITE_OTHER): Payer: Self-pay | Admitting: Family Medicine

## 2020-09-02 ENCOUNTER — Ambulatory Visit (INDEPENDENT_AMBULATORY_CARE_PROVIDER_SITE_OTHER): Payer: 59 | Admitting: Family Medicine

## 2020-09-02 VITALS — BP 120/82 | HR 68 | Temp 98.4°F | Ht 69.0 in | Wt 276.0 lb

## 2020-09-02 DIAGNOSIS — I10 Essential (primary) hypertension: Secondary | ICD-10-CM | POA: Diagnosis not present

## 2020-09-02 DIAGNOSIS — Z6841 Body Mass Index (BMI) 40.0 and over, adult: Secondary | ICD-10-CM | POA: Diagnosis not present

## 2020-09-02 NOTE — Progress Notes (Signed)
Chief Complaint:   OBESITY Kenneth Green is here to discuss his progress with his obesity treatment plan along with follow-up of his obesity related diagnoses. Kenneth Green is on following a lower carbohydrate, vegetable and lean protein rich diet plan and states he is following his eating plan approximately 70% of the time. Kenneth Green states he is doing 0 minutes 0 times per week.  Today's visit was #: 26 Starting weight: 308 lbs Starting date: 04/10/2019 Today's weight: 276 lbs Today's date: 09/02/2020 Total lbs lost to date: 32 Total lbs lost since last in-office visit: 6  Interim History: Kenneth Green continues to do well with weight loss on his low carbohydrate plan. His hunger is mostly controlled.  His exercise is limited due to his ongoing back pain which he is awaiting surgery.  Subjective:   1. Essential hypertension Kenneth Green's blood pressure is well controlled on his medications, and with diet and weight loss. He denies chest pain or dizziness.  Assessment/Plan:   1. Essential hypertension Kenneth Green will continue healthy weight loss, diet, and exercise to improve blood pressure control. We will watch for signs of hypotension as he continues his lifestyle modifications.  2. Class 3 severe obesity with serious comorbidity and body mass index (BMI) of 40.0 to 44.9 in adult, unspecified obesity type [facility]) Kenneth Green is currently in the action stage of change. As such, his goal is to continue with weight loss efforts. He has agreed to following a lower carbohydrate, vegetable and lean protein rich diet plan.   Behavioral modification strategies: increasing lean protein intake, better snacking choices and holiday eating strategies .  Kenneth Green has agreed to follow-up with our clinic in 3 weeks. He was informed of the importance of frequent follow-up visits to maximize his success with intensive lifestyle modifications for his multiple health conditions.   Objective:   Blood pressure 120/82, pulse  68, temperature 98.4 F (36.9 C), height 5\' 9"  (1.753 m), weight 276 lb (125.2 kg), SpO2 99 %. Body mass index is 40.76 kg/m.  General: Cooperative, alert, well developed, in no acute distress. HEENT: Conjunctivae and lids unremarkable. Cardiovascular: Regular rhythm.  Lungs: Normal work of breathing. Neurologic: No focal deficits.   Lab Results  Component Value Date   CREATININE 1.08 07/23/2020   BUN 20 07/23/2020   NA 137 07/23/2020   K 3.7 07/23/2020   CL 100 07/23/2020   CO2 24 07/23/2020   Lab Results  Component Value Date   ALT 36 07/23/2020   AST 31 07/23/2020   ALKPHOS 53 07/23/2020   BILITOT 0.4 07/23/2020   Lab Results  Component Value Date   HGBA1C 5.8 (H) 07/23/2020   HGBA1C 5.6 12/19/2019   HGBA1C 5.7 (H) 09/03/2019   HGBA1C 5.7 05/07/2019   HGBA1C 5.9 (H) 04/10/2019   Lab Results  Component Value Date   INSULIN 18.1 07/23/2020   INSULIN 27.0 (H) 12/19/2019   INSULIN 24.2 09/03/2019   INSULIN 30.9 (H) 04/10/2019   Lab Results  Component Value Date   TSH 1.340 04/10/2019   Lab Results  Component Value Date   CHOL 151 05/07/2020   HDL 48 05/07/2020   LDLCALC 86 05/07/2020   TRIG 88 05/07/2020   Lab Results  Component Value Date   WBC 6.0 04/10/2019   HGB 13.4 04/10/2019   HCT 39.2 04/10/2019   MCV 82 04/10/2019   PLT 235 06/15/2016   No results found for: IRON, TIBC, FERRITIN  Attestation Statements:   Reviewed by clinician on day of visit:  allergies, medications, problem list, medical history, surgical history, family history, social history, and previous encounter notes.  Time spent on visit including pre-visit chart review and post-visit care and charting was 21 minutes.    I, Trixie Dredge, am acting as transcriptionist for Dennard Nip, MD.  I have reviewed the above documentation for accuracy and completeness, and I agree with the above. -  Dennard Nip, MD

## 2020-09-03 LAB — HEPATIC FUNCTION PANEL
ALT: 30 (ref 10–40)
AST: 19 (ref 14–40)

## 2020-09-03 LAB — BASIC METABOLIC PANEL
BUN: 20 (ref 4–21)
CO2: 31 — AB (ref 13–22)
Chloride: 101 (ref 99–108)
Creatinine: 1.2 (ref <=1.3)
Glucose: 92
Potassium: 4.6 (ref 3.4–5.3)
Sodium: 139 (ref 137–147)

## 2020-09-03 LAB — COMPREHENSIVE METABOLIC PANEL
Albumin: 4.3 (ref 3.5–5.0)
GFR calc Af Amer: 79
GFR calc non Af Amer: 65

## 2020-09-03 LAB — VITAMIN D 25 HYDROXY (VIT D DEFICIENCY, FRACTURES): Vit D, 25-Hydroxy: 51.2

## 2020-09-18 ENCOUNTER — Ambulatory Visit (INDEPENDENT_AMBULATORY_CARE_PROVIDER_SITE_OTHER): Payer: 59 | Admitting: Psychology

## 2020-09-18 DIAGNOSIS — F4321 Adjustment disorder with depressed mood: Secondary | ICD-10-CM

## 2020-09-23 ENCOUNTER — Encounter (INDEPENDENT_AMBULATORY_CARE_PROVIDER_SITE_OTHER): Payer: Self-pay

## 2020-09-23 ENCOUNTER — Ambulatory Visit (INDEPENDENT_AMBULATORY_CARE_PROVIDER_SITE_OTHER): Payer: 59 | Admitting: Family Medicine

## 2020-09-23 ENCOUNTER — Encounter (INDEPENDENT_AMBULATORY_CARE_PROVIDER_SITE_OTHER): Payer: Self-pay | Admitting: Family Medicine

## 2020-09-23 ENCOUNTER — Other Ambulatory Visit: Payer: Self-pay

## 2020-09-23 VITALS — BP 134/89 | HR 73 | Temp 98.0°F | Ht 69.0 in | Wt 278.0 lb

## 2020-09-23 DIAGNOSIS — R7303 Prediabetes: Secondary | ICD-10-CM | POA: Diagnosis not present

## 2020-09-23 DIAGNOSIS — Z6841 Body Mass Index (BMI) 40.0 and over, adult: Secondary | ICD-10-CM

## 2020-09-23 DIAGNOSIS — E559 Vitamin D deficiency, unspecified: Secondary | ICD-10-CM | POA: Diagnosis not present

## 2020-09-24 NOTE — Progress Notes (Signed)
Chief Complaint:   OBESITY Kenneth Green is here to discuss his progress with his obesity treatment plan along with follow-up of his obesity related diagnoses. Kenneth Green is on following a lower carbohydrate, vegetable and lean protein rich diet plan and states he is following his eating plan approximately 65% of the time. Kenneth Green states he is walking for 45 minutes 5 times per week.  Today's visit was #: 27 Starting weight: 308 lbs Starting date: 04/10/2019 Today's weight: 278 lbs Today's date: 09/23/2020 Total lbs lost to date: 30 Total lbs lost since last in-office visit: 0  Interim History: Kenneth Green continues to work on diet and weight loss. He is getting ready to have surgery in the next 2 weeks, and she is trying to not gain weight while recovering.  Subjective:   1. Pre-diabetes Kenneth Green's recent A1c was above goal at 5.8. He is working on decreasing simple carbohydrates. I discussed labs with the patient today.  2. Vitamin D deficiency Kenneth Green's Vit D lab was done at [facility] and was within normal limits at 51. He notes fatigue Green improved, and he denies nausea, vomiting, or muscle weakness.  Assessment/Plan:   1. Pre-diabetes Kenneth Green will continue to work on weight loss, diet, exercise, and decreasing simple carbohydrates to help decrease the risk of diabetes. We will recheck labs in January.  2. Vitamin D deficiency Low Vitamin D level contributes to fatigue and are associated with obesity, breast, and colon cancer. Kenneth Green agreed to continue taking Vitamin D and we will recheck labs in January. He will follow-up for routine testing of Vitamin D, at least 2-3 times per year to avoid over-replacement.  3. Class 3 severe obesity with serious comorbidity and body mass index (BMI) of 40.0 to 44.9 in adult, unspecified obesity type Kenneth Green is currently in the action stage of change. As such, his goal is to continue with weight loss efforts. He Green agreed to following a lower  carbohydrate, vegetable and lean protein rich diet plan.   Post-op nutrition was discussed to help decrease sarcopenia.  Exercise goals: As is.  Behavioral modification strategies: increasing lean protein intake and holiday eating strategies .  Kenneth Green agreed to follow-up with our clinic in 5 to 6 weeks. He was informed of the importance of frequent follow-up visits to maximize his success with intensive lifestyle modifications for his multiple health conditions.   Objective:   Blood pressure 134/89, pulse 73, temperature 98 F (36.7 C), height 5\' 9"  (1.753 m), weight 278 lb (126.1 kg), SpO2 96 %. Body mass index is 41.05 kg/m.  General: Cooperative, alert, well developed, in no acute distress. HEENT: Conjunctivae and lids unremarkable. Cardiovascular: Regular rhythm.  Lungs: Normal work of breathing. Neurologic: No focal deficits.   Lab Results  Component Value Date   CREATININE 1.2 09/03/2020   BUN 20 09/03/2020   NA 139 09/03/2020   K 4.6 09/03/2020   CL 101 09/03/2020   CO2 31 (A) 09/03/2020   Lab Results  Component Value Date   ALT 30 09/03/2020   AST 19 09/03/2020   ALKPHOS 53 07/23/2020   BILITOT 0.4 07/23/2020   Lab Results  Component Value Date   HGBA1C 5.8 (H) 07/23/2020   HGBA1C 5.6 12/19/2019   HGBA1C 5.7 (H) 09/03/2019   HGBA1C 5.7 05/07/2019   HGBA1C 5.9 (H) 04/10/2019   Lab Results  Component Value Date   INSULIN 18.1 07/23/2020   INSULIN 27.0 (H) 12/19/2019   INSULIN 24.2 09/03/2019   INSULIN 30.9 (  H) 04/10/2019   Lab Results  Component Value Date   TSH 1.340 04/10/2019   Lab Results  Component Value Date   CHOL 151 05/07/2020   HDL 48 05/07/2020   LDLCALC 86 05/07/2020   TRIG 88 05/07/2020   Lab Results  Component Value Date   WBC 6.0 04/10/2019   HGB 13.4 04/10/2019   HCT 39.2 04/10/2019   MCV 82 04/10/2019   PLT 235 06/15/2016   No results found for: IRON, TIBC, FERRITIN  Attestation Statements:   Reviewed by  clinician on day of visit: allergies, medications, problem list, medical history, surgical history, family history, social history, and previous encounter notes.  Time spent on visit including pre-visit chart review and post-visit care and charting was 30 minutes.    I, Trixie Dredge, am acting as transcriptionist for Dennard Nip, MD.  I have reviewed the above documentation for accuracy and completeness, and I agree with the above. -  Dennard Nip, MD

## 2020-09-30 ENCOUNTER — Other Ambulatory Visit (INDEPENDENT_AMBULATORY_CARE_PROVIDER_SITE_OTHER): Payer: Self-pay | Admitting: Family Medicine

## 2020-09-30 DIAGNOSIS — F3289 Other specified depressive episodes: Secondary | ICD-10-CM

## 2020-10-29 ENCOUNTER — Ambulatory Visit (INDEPENDENT_AMBULATORY_CARE_PROVIDER_SITE_OTHER): Payer: 59 | Admitting: Psychology

## 2020-10-29 DIAGNOSIS — F4321 Adjustment disorder with depressed mood: Secondary | ICD-10-CM | POA: Diagnosis not present

## 2020-11-04 ENCOUNTER — Other Ambulatory Visit: Payer: Self-pay

## 2020-11-04 ENCOUNTER — Ambulatory Visit (INDEPENDENT_AMBULATORY_CARE_PROVIDER_SITE_OTHER): Payer: 59 | Admitting: Family Medicine

## 2020-11-04 ENCOUNTER — Encounter (INDEPENDENT_AMBULATORY_CARE_PROVIDER_SITE_OTHER): Payer: Self-pay | Admitting: Family Medicine

## 2020-11-04 VITALS — BP 135/84 | HR 79 | Temp 97.7°F | Ht 69.0 in | Wt 272.0 lb

## 2020-11-04 DIAGNOSIS — Z9189 Other specified personal risk factors, not elsewhere classified: Secondary | ICD-10-CM | POA: Diagnosis not present

## 2020-11-04 DIAGNOSIS — Z6841 Body Mass Index (BMI) 40.0 and over, adult: Secondary | ICD-10-CM

## 2020-11-04 DIAGNOSIS — F3289 Other specified depressive episodes: Secondary | ICD-10-CM | POA: Diagnosis not present

## 2020-11-04 MED ORDER — BUPROPION HCL ER (SR) 200 MG PO TB12: 200.0000 mg | ORAL_TABLET | Freq: Two times a day (BID) | ORAL | 0 refills | Status: DC

## 2020-11-04 NOTE — Progress Notes (Signed)
Chief Complaint:   OBESITY Kenneth Green is here to discuss his progress with his obesity treatment plan along with follow-up of his obesity related diagnoses. Kenneth Green is on following a lower carbohydrate, vegetable and lean protein rich diet plan and states he is following his eating plan approximately 75% of the time. Kenneth Green states he is walking for 45 minutes 7 times per week.  Today's visit was #: 28 Starting weight: 308 lbs Starting date: 04/10/2019 Today's weight: 272 lbs Today's date: 11/04/2020 Total lbs lost to date: 36 Total lbs lost since last in-office visit: 6  Interim History: Kenneth Green is recovering from back surgery. After the first week after surgery he was able to get back on track. He is doing well with meal planning and being creative with recipes. He is allowed to do walking but no bending, twisting, or lifting.  Subjective:   1. Other depression with emotional eating Kenneth Green's mood is stable on his medications. He is doing well avoiding emotional eating, and he is doing very well with meal planning and prepping. His blood pressure is stable.  2. At risk for activity intolerance Kenneth Green is at risk for exercise intolerance due to surgery recovery.  Assessment/Plan:   1. Other depression with emotional eating Emotional eating strategies were discussed today to help Kenneth Green deal with his emotional/non-hunger eating behaviors. We will refill Wellbutrin SR for 1 month. Orders and follow up as documented in patient record.   - buPROPion (WELLBUTRIN SR) 200 MG 12 hr tablet; Take 1 tablet (200 mg total) by mouth 2 (two) times daily.  Dispense: 60 tablet; Refill: 0  2. At risk for activity intolerance Kenneth Green was given approximately 15 minutes of exercise intolerance counseling today. He is 54 y.o. male and has risk factors exercise intolerance including obesity. We discussed intensive lifestyle modifications today with an emphasis on specific weight loss instructions and  strategies. Kenneth Green will slowly increase activity as tolerated.  Repetitive spaced learning was employed today to elicit superior memory formation and behavioral change.  3. Class 3 severe obesity with serious comorbidity and body mass index (BMI) of 40.0 to 44.9 in adult, unspecified obesity type Kenneth Green) Kenneth Green is currently in the action stage of change. As such, his goal is to continue with weight loss efforts. He has agreed to following a lower carbohydrate, vegetable and lean protein rich diet plan.   Exercise goals: As is.  Behavioral modification strategies: emotional eating strategies and holiday eating strategies .  Kenneth Green has agreed to follow-up with our clinic in 4 weeks. He was informed of the importance of frequent follow-up visits to maximize his success with intensive lifestyle modifications for his multiple health conditions.   Objective:   Blood pressure 135/84, pulse 79, temperature 97.7 F (36.5 C), height 5\' 9"  (1.753 m), weight 272 lb (123.4 kg), SpO2 98 %. Body mass index is 40.17 kg/m.  General: Cooperative, alert, well developed, in no acute distress. HEENT: Conjunctivae and lids unremarkable. Cardiovascular: Regular rhythm.  Lungs: Normal work of breathing. Neurologic: No focal deficits.   Lab Results  Component Value Date   CREATININE 1.2 09/03/2020   BUN 20 09/03/2020   NA 139 09/03/2020   K 4.6 09/03/2020   CL 101 09/03/2020   CO2 31 (A) 09/03/2020   Lab Results  Component Value Date   ALT 30 09/03/2020   AST 19 09/03/2020   ALKPHOS 53 07/23/2020   BILITOT 0.4 07/23/2020   Lab Results  Component Value Date   HGBA1C 5.8 (  H) 07/23/2020   HGBA1C 5.6 12/19/2019   HGBA1C 5.7 (H) 09/03/2019   HGBA1C 5.7 05/07/2019   HGBA1C 5.9 (H) 04/10/2019   Lab Results  Component Value Date   INSULIN 18.1 07/23/2020   INSULIN 27.0 (H) 12/19/2019   INSULIN 24.2 09/03/2019   INSULIN 30.9 (H) 04/10/2019   Lab Results  Component Value Date   TSH 1.340  04/10/2019   Lab Results  Component Value Date   CHOL 151 05/07/2020   HDL 48 05/07/2020   LDLCALC 86 05/07/2020   TRIG 88 05/07/2020   Lab Results  Component Value Date   WBC 6.0 04/10/2019   HGB 13.4 04/10/2019   HCT 39.2 04/10/2019   MCV 82 04/10/2019   PLT 235 06/15/2016   No results found for: IRON, TIBC, FERRITIN  Attestation Statements:   Reviewed by clinician on day of visit: allergies, medications, problem list, medical history, surgical history, family history, social history, and previous encounter notes.   I, Trixie Dredge, am acting as transcriptionist for Dennard Nip, MD.  I have reviewed the above documentation for accuracy and completeness, and I agree with the above. -  Dennard Nip, MD

## 2020-11-27 ENCOUNTER — Other Ambulatory Visit (INDEPENDENT_AMBULATORY_CARE_PROVIDER_SITE_OTHER): Payer: Self-pay | Admitting: Family Medicine

## 2020-11-27 ENCOUNTER — Encounter (INDEPENDENT_AMBULATORY_CARE_PROVIDER_SITE_OTHER): Payer: Self-pay

## 2020-11-27 DIAGNOSIS — F3289 Other specified depressive episodes: Secondary | ICD-10-CM

## 2020-11-27 NOTE — Telephone Encounter (Signed)
MyChart message sent to pt to find out if they have enough medication to get them through until next appt.   

## 2020-12-01 NOTE — Telephone Encounter (Signed)
Pt indicated via MyChart message that they are not in need of a refill at this time.

## 2020-12-03 ENCOUNTER — Ambulatory Visit (INDEPENDENT_AMBULATORY_CARE_PROVIDER_SITE_OTHER): Payer: 59 | Admitting: Psychology

## 2020-12-03 DIAGNOSIS — F4321 Adjustment disorder with depressed mood: Secondary | ICD-10-CM | POA: Diagnosis not present

## 2020-12-10 ENCOUNTER — Encounter (INDEPENDENT_AMBULATORY_CARE_PROVIDER_SITE_OTHER): Payer: Self-pay | Admitting: Family Medicine

## 2020-12-10 ENCOUNTER — Telehealth (INDEPENDENT_AMBULATORY_CARE_PROVIDER_SITE_OTHER): Payer: 59 | Admitting: Family Medicine

## 2020-12-10 DIAGNOSIS — F3289 Other specified depressive episodes: Secondary | ICD-10-CM

## 2020-12-10 DIAGNOSIS — R7303 Prediabetes: Secondary | ICD-10-CM | POA: Diagnosis not present

## 2020-12-10 DIAGNOSIS — Z9189 Other specified personal risk factors, not elsewhere classified: Secondary | ICD-10-CM | POA: Diagnosis not present

## 2020-12-10 DIAGNOSIS — Z6841 Body Mass Index (BMI) 40.0 and over, adult: Secondary | ICD-10-CM | POA: Diagnosis not present

## 2020-12-10 MED ORDER — BUPROPION HCL ER (SR) 200 MG PO TB12: 200.0000 mg | ORAL_TABLET | Freq: Two times a day (BID) | ORAL | 0 refills | Status: DC

## 2020-12-11 NOTE — Progress Notes (Signed)
TeleHealth Visit:  Due to the COVID-19 pandemic, this visit was completed with telemedicine (audio/video) technology to reduce patient and provider exposure as well as to preserve personal protective equipment.   Kenneth Green has verbally consented to this TeleHealth visit. The patient is located at home, the provider is located at the [facility] office. The participants in this visit include the listed provider and patient. The visit was conducted today via MyChart video.   Chief Complaint: OBESITY Kenneth Green is here to discuss his progress with his obesity treatment plan along with follow-up of his obesity related diagnoses. Kenneth Green is on following a lower carbohydrate, vegetable and lean protein rich diet plan and states he is following his eating plan approximately 80% of the time. Kenneth Green states he is walking for 30-45 minutes 3 times per week.  Today's visit was #: 29 Starting weight: 308 lbs Starting date: 04/10/2019  Interim History: Kenneth Green has done well with the low carbohydrate plan after the holidays. He is doing well with weight loss, and his hunger is mostly controlled.  Subjective:   1. Pre-diabetes Kenneth Green is doing well with his low carbohydrate plan, and weight loss. He denies signs of hypoglycemia and polyphagia has decreased.   2. Other depression with emotional eating Kenneth Green's mood is stable on his medications. He appears to be doing well with minimizing emotional eating behaviors.  3. At risk for heart disease Kenneth Green is at a higher than average risk for cardiovascular disease due to obesity.   Assessment/Plan:   1. Pre-diabetes Kenneth Green will continue to work on weight loss, diet, exercise, and decreasing simple carbohydrates to help decrease the risk of diabetes. We will plan to recheck labs at his follow up visit.  2. Other depression with emotional eating Behavior modification techniques were discussed today to help Kenneth Green deal with his  emotional/non-hunger eating behaviors. We will refill Wellbutrin SR for 1 month, and will continue to follow closely. Orders and follow up as documented in patient record.   - buPROPion (WELLBUTRIN SR) 200 MG 12 hr tablet; Take 1 tablet (200 mg total) by mouth 2 (two) times daily.  Dispense: 60 tablet; Refill: 0  3. At risk for heart disease Kenneth Green was given approximately 15 minutes of coronary artery disease prevention counseling today. He is 55 y.o. male and has risk factors for heart disease including obesity. We discussed intensive lifestyle modifications today with an emphasis on specific weight loss instructions and strategies.   Repetitive spaced learning was employed today to elicit superior memory formation and behavioral change.  4. Class 3 severe obesity with serious comorbidity and body mass index (BMI) of 40.0 to 44.9 in adult, unspecified obesity type Kenneth Green) Kenneth Green is currently in the action stage of change. As such, his goal is to continue with weight loss efforts. He has agreed to following a lower carbohydrate, vegetable and lean protein rich diet plan.   Exercise goals: As is.  Behavioral modification strategies: increasing water intake and meal planning and cooking strategies.  Kenneth Green has agreed to follow-up with our clinic in 4 weeks. He was informed of the importance of frequent follow-up visits to maximize his success with intensive lifestyle modifications for his multiple health conditions.  Objective:   VITALS: Per patient if applicable, see vitals. GENERAL: Alert and in no acute distress. CARDIOPULMONARY: No increased WOB. Speaking in clear sentences.  PSYCH: Pleasant and cooperative. Speech normal rate and rhythm. Affect is appropriate. Insight and judgement are appropriate. Attention is focused, linear, and appropriate.  NEURO: Oriented as arrived to appointment on time with no prompting.   Lab Results  Component Value Date   CREATININE 1.2 09/03/2020   BUN  20 09/03/2020   NA 139 09/03/2020   K 4.6 09/03/2020   CL 101 09/03/2020   CO2 31 (A) 09/03/2020   Lab Results  Component Value Date   ALT 30 09/03/2020   AST 19 09/03/2020   ALKPHOS 53 07/23/2020   BILITOT 0.4 07/23/2020   Lab Results  Component Value Date   HGBA1C 5.8 (H) 07/23/2020   HGBA1C 5.6 12/19/2019   HGBA1C 5.7 (H) 09/03/2019   HGBA1C 5.7 05/07/2019   HGBA1C 5.9 (H) 04/10/2019   Lab Results  Component Value Date   INSULIN 18.1 07/23/2020   INSULIN 27.0 (H) 12/19/2019   INSULIN 24.2 09/03/2019   INSULIN 30.9 (H) 04/10/2019   Lab Results  Component Value Date   TSH 1.340 04/10/2019   Lab Results  Component Value Date   CHOL 151 05/07/2020   HDL 48 05/07/2020   LDLCALC 86 05/07/2020   TRIG 88 05/07/2020   Lab Results  Component Value Date   WBC 6.0 04/10/2019   HGB 13.4 04/10/2019   HCT 39.2 04/10/2019   MCV 82 04/10/2019   PLT 235 06/15/2016   No results found for: IRON, TIBC, FERRITIN  Attestation Statements:   Reviewed by clinician on day of visit: allergies, medications, problem list, medical history, surgical history, family history, social history, and previous encounter notes.   I, Trixie Dredge, am acting as transcriptionist for Dennard Nip, MD.  I have reviewed the above documentation for accuracy and completeness, and I agree with the above. - Dennard Nip, MD

## 2021-01-03 ENCOUNTER — Other Ambulatory Visit (INDEPENDENT_AMBULATORY_CARE_PROVIDER_SITE_OTHER): Payer: Self-pay | Admitting: Family Medicine

## 2021-01-03 DIAGNOSIS — F3289 Other specified depressive episodes: Secondary | ICD-10-CM

## 2021-01-05 ENCOUNTER — Encounter (INDEPENDENT_AMBULATORY_CARE_PROVIDER_SITE_OTHER): Payer: Self-pay

## 2021-01-05 NOTE — Telephone Encounter (Signed)
MyChart message sent to pt to find out if they have enough medication to get them through until next appt.   

## 2021-01-14 ENCOUNTER — Other Ambulatory Visit (INDEPENDENT_AMBULATORY_CARE_PROVIDER_SITE_OTHER): Payer: Self-pay | Admitting: Family Medicine

## 2021-01-14 DIAGNOSIS — F3289 Other specified depressive episodes: Secondary | ICD-10-CM

## 2021-01-15 ENCOUNTER — Ambulatory Visit (INDEPENDENT_AMBULATORY_CARE_PROVIDER_SITE_OTHER): Payer: 59 | Admitting: Family Medicine

## 2021-01-15 ENCOUNTER — Other Ambulatory Visit: Payer: Self-pay

## 2021-01-15 ENCOUNTER — Encounter (INDEPENDENT_AMBULATORY_CARE_PROVIDER_SITE_OTHER): Payer: Self-pay | Admitting: Family Medicine

## 2021-01-15 VITALS — BP 146/92 | HR 73 | Temp 98.0°F | Ht 69.0 in | Wt 272.0 lb

## 2021-01-15 DIAGNOSIS — Z9189 Other specified personal risk factors, not elsewhere classified: Secondary | ICD-10-CM

## 2021-01-15 DIAGNOSIS — E7849 Other hyperlipidemia: Secondary | ICD-10-CM | POA: Diagnosis not present

## 2021-01-15 DIAGNOSIS — Z6841 Body Mass Index (BMI) 40.0 and over, adult: Secondary | ICD-10-CM

## 2021-01-15 DIAGNOSIS — R7303 Prediabetes: Secondary | ICD-10-CM | POA: Diagnosis not present

## 2021-01-15 DIAGNOSIS — F3289 Other specified depressive episodes: Secondary | ICD-10-CM

## 2021-01-15 DIAGNOSIS — E559 Vitamin D deficiency, unspecified: Secondary | ICD-10-CM

## 2021-01-15 DIAGNOSIS — I10 Essential (primary) hypertension: Secondary | ICD-10-CM

## 2021-01-15 NOTE — Telephone Encounter (Signed)
Last OV with Dr. Beasley 

## 2021-01-17 LAB — VITAMIN D 25 HYDROXY (VIT D DEFICIENCY, FRACTURES): Vit D, 25-Hydroxy: 34.3 ng/mL (ref 30.0–100.0)

## 2021-01-17 LAB — LIPID PANEL WITH LDL/HDL RATIO
Cholesterol, Total: 170 mg/dL (ref 100–199)
HDL: 46 mg/dL (ref >39)
LDL Chol Calc (NIH): 105 mg/dL — ABNORMAL HIGH (ref 0–99)
LDL/HDL Ratio: 2.3 ratio (ref 0.0–3.6)
Triglycerides: 104 mg/dL (ref 0–149)
VLDL Cholesterol Cal: 19 mg/dL (ref 5–40)

## 2021-01-17 LAB — COMPREHENSIVE METABOLIC PANEL
ALT: 49 IU/L — ABNORMAL HIGH (ref 0–44)
AST: 43 IU/L — ABNORMAL HIGH (ref 0–40)
Albumin/Globulin Ratio: 1.7 (ref 1.2–2.2)
Albumin: 4.5 g/dL (ref 3.8–4.9)
Alkaline Phosphatase: 70 IU/L (ref 44–121)
BUN/Creatinine Ratio: 10 (ref 9–20)
BUN: 11 mg/dL (ref 6–24)
Bilirubin Total: 0.5 mg/dL (ref 0.0–1.2)
CO2: 23 mmol/L (ref 20–29)
Calcium: 9.7 mg/dL (ref 8.7–10.2)
Chloride: 103 mmol/L (ref 96–106)
Creatinine, Ser: 1.05 mg/dL (ref 0.76–1.27)
GFR calc Af Amer: 91 mL/min/{1.73_m2} (ref >59)
GFR calc non Af Amer: 79 mL/min/{1.73_m2} (ref >59)
Globulin, Total: 2.7 g/dL (ref 1.5–4.5)
Glucose: 94 mg/dL (ref 65–99)
Potassium: 4.1 mmol/L (ref 3.5–5.2)
Sodium: 140 mmol/L (ref 134–144)
Total Protein: 7.2 g/dL (ref 6.0–8.5)

## 2021-01-17 LAB — INSULIN, RANDOM: INSULIN: 12.8 u[IU]/mL (ref 2.6–24.9)

## 2021-01-17 LAB — HEMOGLOBIN A1C
Est. average glucose Bld gHb Est-mCnc: 123 mg/dL
Hgb A1c MFr Bld: 5.9 % — ABNORMAL HIGH (ref 4.8–5.6)

## 2021-01-19 ENCOUNTER — Ambulatory Visit (INDEPENDENT_AMBULATORY_CARE_PROVIDER_SITE_OTHER): Payer: 59 | Admitting: Psychology

## 2021-01-19 DIAGNOSIS — F4321 Adjustment disorder with depressed mood: Secondary | ICD-10-CM

## 2021-01-19 MED ORDER — BUPROPION HCL ER (SR) 200 MG PO TB12: 200.0000 mg | ORAL_TABLET | Freq: Two times a day (BID) | ORAL | 0 refills | Status: DC

## 2021-01-19 NOTE — Progress Notes (Signed)
Chief Complaint:   OBESITY Kenneth Green is here to discuss his progress with his obesity treatment plan along with follow-up of his obesity related diagnoses. Kenneth Green is on following a lower carbohydrate, vegetable and lean protein rich diet plan and states he is following his eating plan approximately 75% of the time. Kenneth Green states he is walking for 45 minutes 5 times per week.  Today's visit was #: 30 Starting weight: 308 lbs Starting date: 04/10/2019 Today's weight: 272 lbs Today's date: 01/15/2021 Total lbs lost to date: 36 Total lbs lost since last in-office visit: 0  Interim History: Kenneth Green has done well maintaining his weight. He is struggling with increased stress especially at home and dealing with his wife's poor health. He has minimal time or ability to meal plan, and he has had to "grab and go" more often.  Subjective:   1. Essential hypertension Kenneth Green's blood pressure is elevated today. He feels this may be due to his increased stress. He is taking his blood pressure medications regularly.  2. Pre-diabetes Kenneth Green is working on diet and exercise, and he is due for labs.  3. Other hyperlipidemia Kenneth Green is working on diet and exercise, and his last cholesterol level was above goal. He denies chest pain.  4. Vitamin D deficiency Kenneth Green is on vit D and due for labs today.  5. Other depression with emotional eating Kenneth Green notes increased stress eating recently, and he is frustrated with himself. He has done well avoiding weight gain however, but he is dealing with some  Difficult health issues with his wife.  6. At risk for heart disease Kenneth Green is at a higher than average risk for cardiovascular disease due to obesity.   Assessment/Plan:   1. Essential hypertension We will check labs today. Bobbi is working on healthy weight loss and exercise to improve blood pressure control. We will watch for signs of hypotension as he continues his lifestyle modifications.  We will recheck his blood pressure in 4 weeks, if it is still elevated then we may need to adjust his medications.  - Comprehensive metabolic panel  2. Pre-diabetes Kenneth Green will continue to work on weight loss, exercise, and decreasing simple carbohydrates to help decrease the risk of diabetes. We will check labs today.  - Insulin, random - Hemoglobin A1c  3. Other hyperlipidemia Cardiovascular risk and specific lipid/LDL goals reviewed. We discussed several lifestyle modifications today. Kenneth Green will continue to work on diet, exercise and weight loss efforts. We will check labs today. Orders and follow up as documented in patient record.   - Lipid Panel With LDL/HDL Ratio  4. Vitamin D deficiency Low Vitamin D level contributes to fatigue and are associated with obesity, breast, and colon cancer. We will check labs today. Kenneth Green will follow-up for routine testing of Vitamin D, at least 2-3 times per year to avoid over-replacement.  - VITAMIN D 25 Hydroxy (Vit-D Deficiency, Fractures)  5. Other depression with emotional eating Behavior modification techniques were discussed today to help Kenneth Green deal with his emotional/non-hunger eating behaviors. We will refill Wellbutrin SR 200 mg BID #60 for 1 month. Orders and follow up as documented in patient record.   6. At risk for heart disease Kenneth Green was given approximately 15 minutes of coronary artery disease prevention counseling today. He is 55 y.o. male and has risk factors for heart disease including obesity. We discussed intensive lifestyle modifications today with an emphasis on specific weight loss instructions and strategies.   Repetitive spaced learning was employed  today to elicit superior memory formation and behavioral change.  7. Class 3 severe obesity with serious comorbidity and body mass index (BMI) of 40.0 to 44.9 in adult, unspecified obesity type [facility]) Kenneth Green is currently in the action stage of change. As such, his goal  is to continue with weight loss efforts. He has agreed to keeping a food journal and adhering to recommended goals of 1500-1700 calories and 85+ grams of protein daily.   Eating Out handout was given, and "pre-packaged plan" strategies were discussed.  Exercise goals: As is.  Behavioral modification strategies: increasing lean protein intake and meal planning and cooking strategies.  Kenneth Green has agreed to follow-up with our clinic in 4 weeks. He was informed of the importance of frequent follow-up visits to maximize his success with intensive lifestyle modifications for his multiple health conditions.   Kenneth Green was informed we would discuss his lab results at his next visit unless there is a critical issue that needs to be addressed sooner. Kenneth Green agreed to keep his next visit at the agreed upon time to discuss these results.  Objective:   Blood pressure (!) 146/92, pulse 73, temperature 98 F (36.7 C), height 5\' 9"  (1.753 m), weight 272 lb (123.4 kg), SpO2 99 %. Body mass index is 40.17 kg/m.  General: Cooperative, alert, well developed, in no acute distress. HEENT: Conjunctivae and lids unremarkable. Cardiovascular: Regular rhythm.  Lungs: Normal work of breathing. Neurologic: No focal deficits.   Lab Results  Component Value Date   CREATININE 1.05 01/15/2021   BUN 11 01/15/2021   NA 140 01/15/2021   K 4.1 01/15/2021   CL 103 01/15/2021   CO2 23 01/15/2021   Lab Results  Component Value Date   ALT 49 (H) 01/15/2021   AST 43 (H) 01/15/2021   ALKPHOS 70 01/15/2021   BILITOT 0.5 01/15/2021   Lab Results  Component Value Date   HGBA1C 5.9 (H) 01/15/2021   HGBA1C 5.8 (H) 07/23/2020   HGBA1C 5.6 12/19/2019   HGBA1C 5.7 (H) 09/03/2019   HGBA1C 5.7 05/07/2019   Lab Results  Component Value Date   INSULIN 12.8 01/15/2021   INSULIN 18.1 07/23/2020   INSULIN 27.0 (H) 12/19/2019   INSULIN 24.2 09/03/2019   INSULIN 30.9 (H) 04/10/2019   Lab Results  Component Value  Date   TSH 1.340 04/10/2019   Lab Results  Component Value Date   CHOL 170 01/15/2021   HDL 46 01/15/2021   LDLCALC 105 (H) 01/15/2021   TRIG 104 01/15/2021   Lab Results  Component Value Date   WBC 6.0 04/10/2019   HGB 13.4 04/10/2019   HCT 39.2 04/10/2019   MCV 82 04/10/2019   PLT 235 06/15/2016   No results found for: IRON, TIBC, FERRITIN  Attestation Statements:   Reviewed by clinician on day of visit: allergies, medications, problem list, medical history, surgical history, family history, social history, and previous encounter notes.   I, Trixie Dredge, am acting as transcriptionist for Dennard Nip, MD.  I have reviewed the above documentation for accuracy and completeness, and I agree with the above. -  *Dennard Nip, MD **

## 2021-02-02 ENCOUNTER — Ambulatory Visit (INDEPENDENT_AMBULATORY_CARE_PROVIDER_SITE_OTHER): Payer: 59 | Admitting: Psychology

## 2021-02-02 DIAGNOSIS — F4321 Adjustment disorder with depressed mood: Secondary | ICD-10-CM

## 2021-02-18 ENCOUNTER — Other Ambulatory Visit: Payer: Self-pay

## 2021-02-18 ENCOUNTER — Encounter (INDEPENDENT_AMBULATORY_CARE_PROVIDER_SITE_OTHER): Payer: Self-pay | Admitting: Family Medicine

## 2021-02-18 ENCOUNTER — Ambulatory Visit (INDEPENDENT_AMBULATORY_CARE_PROVIDER_SITE_OTHER): Payer: 59 | Admitting: Family Medicine

## 2021-02-18 VITALS — BP 136/85 | HR 70 | Temp 98.0°F | Ht 69.0 in | Wt 274.0 lb

## 2021-02-18 DIAGNOSIS — F3289 Other specified depressive episodes: Secondary | ICD-10-CM

## 2021-02-18 DIAGNOSIS — Z6841 Body Mass Index (BMI) 40.0 and over, adult: Secondary | ICD-10-CM | POA: Diagnosis not present

## 2021-02-18 MED ORDER — BUPROPION HCL ER (SR) 200 MG PO TB12: 200.0000 mg | ORAL_TABLET | Freq: Two times a day (BID) | ORAL | 0 refills | Status: DC

## 2021-02-23 ENCOUNTER — Ambulatory Visit (INDEPENDENT_AMBULATORY_CARE_PROVIDER_SITE_OTHER): Payer: 59 | Admitting: Psychology

## 2021-02-23 DIAGNOSIS — F4321 Adjustment disorder with depressed mood: Secondary | ICD-10-CM | POA: Diagnosis not present

## 2021-02-25 NOTE — Progress Notes (Signed)
Chief Complaint:   OBESITY Siris is here to discuss his progress with his obesity treatment plan along with follow-up of his obesity related diagnoses. Neeraj is on keeping a food journal and adhering to recommended goals of 1500-1700 calories and 85+ grams of protein daily and states he is following his eating plan approximately 65% of the time. Lannis states he is walking for 45 minutes 5 times per week.  Today's visit was #: 19 Starting weight: 308 lbs Starting date: 04/10/2019 Today's weight: 274 lbs Today's date: 02/18/2021 Total lbs lost to date: 34 Total lbs lost since last in-office visit: 0  Interim History: Tyriek is struggling with increased stress and increased stress eating. He is considering getting back to the Category 4 plan, as he feels he would do better with a more structured plan.  Subjective:   1. Other depression with emotional eating Samael is stable on his medications, but he has struggled with increased stress and possibly increased stress eating.  Assessment/Plan:   1. Other depression with emotional eating Emotional eating behaviors were discussed with [name] today. We will refill Wellbutrin SR for 1 month. Orders and follow up as documented in patient record.   - buPROPion (WELLBUTRIN SR) 200 MG 12 hr tablet; Take 1 tablet (200 mg total) by mouth 2 (two) times daily.  Dispense: 60 tablet; Refill: 0  2. Class 3 severe obesity with serious comorbidity and body mass index (BMI) of 40.0 to 44.9 in adult, unspecified obesity type [facility]) Adewale is currently in the action stage of change. As such, his goal is to continue with weight loss efforts. He has agreed to the Category 4 Plan.   Exercise goals: As is.  Behavioral modification strategies: increasing lean protein intake and emotional eating strategies.  Josie has agreed to follow-up with our clinic in 4 weeks. He was informed of the importance of frequent follow-up visits to maximize his  success with intensive lifestyle modifications for his multiple health conditions.   Objective:   Blood pressure 136/85, pulse 70, temperature 98 F (36.7 C), height 5\' 9"  (1.753 m), weight 274 lb (124.3 kg), SpO2 98 %. Body mass index is 40.46 kg/m.  General: Cooperative, alert, well developed, in no acute distress. HEENT: Conjunctivae and lids unremarkable. Cardiovascular: Regular rhythm.  Lungs: Normal work of breathing. Neurologic: No focal deficits.   Lab Results  Component Value Date   CREATININE 1.05 01/15/2021   BUN 11 01/15/2021   NA 140 01/15/2021   K 4.1 01/15/2021   CL 103 01/15/2021   CO2 23 01/15/2021   Lab Results  Component Value Date   ALT 49 (H) 01/15/2021   AST 43 (H) 01/15/2021   ALKPHOS 70 01/15/2021   BILITOT 0.5 01/15/2021   Lab Results  Component Value Date   HGBA1C 5.9 (H) 01/15/2021   HGBA1C 5.8 (H) 07/23/2020   HGBA1C 5.6 12/19/2019   HGBA1C 5.7 (H) 09/03/2019   HGBA1C 5.7 05/07/2019   Lab Results  Component Value Date   INSULIN 12.8 01/15/2021   INSULIN 18.1 07/23/2020   INSULIN 27.0 (H) 12/19/2019   INSULIN 24.2 09/03/2019   INSULIN 30.9 (H) 04/10/2019   Lab Results  Component Value Date   TSH 1.340 04/10/2019   Lab Results  Component Value Date   CHOL 170 01/15/2021   HDL 46 01/15/2021   LDLCALC 105 (H) 01/15/2021   TRIG 104 01/15/2021   Lab Results  Component Value Date   WBC 6.0 04/10/2019   HGB  13.4 04/10/2019   HCT 39.2 04/10/2019   MCV 82 04/10/2019   PLT 235 06/15/2016   No results found for: IRON, TIBC, FERRITIN  Attestation Statements:   Reviewed by clinician on day of visit: allergies, medications, problem list, medical history, surgical history, family history, social history, and previous encounter notes.  Time spent on visit including pre-visit chart review and post-visit care and charting was 30 minutes.    I, Trixie Dredge, am acting as transcriptionist for Dennard Nip, MD.  I have reviewed the  above documentation for accuracy and completeness, and I agree with the above. -  Dennard Nip, MD

## 2021-03-16 ENCOUNTER — Encounter (INDEPENDENT_AMBULATORY_CARE_PROVIDER_SITE_OTHER): Payer: Self-pay | Admitting: Family Medicine

## 2021-03-16 ENCOUNTER — Other Ambulatory Visit: Payer: Self-pay

## 2021-03-16 ENCOUNTER — Ambulatory Visit (INDEPENDENT_AMBULATORY_CARE_PROVIDER_SITE_OTHER): Payer: 59 | Admitting: Family Medicine

## 2021-03-16 VITALS — BP 134/74 | HR 70 | Temp 97.9°F | Ht 69.0 in | Wt 275.0 lb

## 2021-03-16 DIAGNOSIS — Z6841 Body Mass Index (BMI) 40.0 and over, adult: Secondary | ICD-10-CM

## 2021-03-16 DIAGNOSIS — F3289 Other specified depressive episodes: Secondary | ICD-10-CM | POA: Diagnosis not present

## 2021-03-17 MED ORDER — BUPROPION HCL ER (SR) 200 MG PO TB12: 200.0000 mg | ORAL_TABLET | Freq: Two times a day (BID) | ORAL | 0 refills | Status: DC

## 2021-03-18 NOTE — Progress Notes (Signed)
Chief Complaint:   OBESITY Kenneth Green is here to discuss his progress with his obesity treatment plan along with follow-up of his obesity related diagnoses. Kenneth Green is on the Category 4 Plan and states he is following his eating plan approximately 75% of the time. Kenneth Green states he is walking for 45 minutes 5 times per week.  Today's visit was #: 49 Starting weight: 308 lbs Starting date: 04/10/2019 Today's weight: 275 lbs Today's date: 03/16/2021 Total lbs lost to date: 33 Total lbs lost since last in-office visit: 0  Interim History: Kenneth Green is up approximately 2 lbs of water weight. He had some increased temptations and celebration eating. He is doing a lot of gardening, planting, mulching, and digging, etc in addition to his walking.  Subjective:   1. Other depression with emotional eating Kenneth Green is continuing to work on decreasing emotional eating behaviors, and he has been able to make better choices in general.  Assessment/Plan:   1. Other depression with emotional eating Behavior modification techniques were discussed today to help Kenneth Green deal with his emotional/non-hunger eating behaviors. We will refill Wellbutrin SR for 1 month. Orders and follow up as documented in patient record.   - buPROPion (WELLBUTRIN SR) 200 MG 12 hr tablet; Take 1 tablet (200 mg total) by mouth 2 (two) times daily.  Dispense: 60 tablet; Refill: 0  2. Class 3 severe obesity with serious comorbidity and body mass index (BMI) of 45.0 to 49.9 in adult, unspecified obesity type Kenneth Green) Azhar is currently in the action stage of change. As such, his goal is to continue with weight loss efforts. He has agreed to the Category 4 Plan.   Exercise goals: As is.  Behavioral modification strategies: increasing lean protein intake and meal planning and cooking strategies.  Kenneth Green has agreed to follow-up with our clinic in 4 weeks. He was informed of the importance of frequent follow-up visits to maximize  his success with intensive lifestyle modifications for his multiple health conditions.   Objective:   Blood pressure 134/74, pulse 70, temperature 97.9 F (36.6 C), height 5\' 9"  (1.753 m), weight 275 lb (124.7 kg), SpO2 98 %. Body mass index is 40.61 kg/m.  General: Cooperative, alert, well developed, in no acute distress. HEENT: Conjunctivae and lids unremarkable. Cardiovascular: Regular rhythm.  Lungs: Normal work of breathing. Neurologic: No focal deficits.   Lab Results  Component Value Date   CREATININE 1.05 01/15/2021   BUN 11 01/15/2021   NA 140 01/15/2021   K 4.1 01/15/2021   CL 103 01/15/2021   CO2 23 01/15/2021   Lab Results  Component Value Date   ALT 49 (H) 01/15/2021   AST 43 (H) 01/15/2021   ALKPHOS 70 01/15/2021   BILITOT 0.5 01/15/2021   Lab Results  Component Value Date   HGBA1C 5.9 (H) 01/15/2021   HGBA1C 5.8 (H) 07/23/2020   HGBA1C 5.6 12/19/2019   HGBA1C 5.7 (H) 09/03/2019   HGBA1C 5.7 05/07/2019   Lab Results  Component Value Date   INSULIN 12.8 01/15/2021   INSULIN 18.1 07/23/2020   INSULIN 27.0 (H) 12/19/2019   INSULIN 24.2 09/03/2019   INSULIN 30.9 (H) 04/10/2019   Lab Results  Component Value Date   TSH 1.340 04/10/2019   Lab Results  Component Value Date   CHOL 170 01/15/2021   HDL 46 01/15/2021   LDLCALC 105 (H) 01/15/2021   TRIG 104 01/15/2021   Lab Results  Component Value Date   WBC 6.0 04/10/2019   HGB  13.4 04/10/2019   HCT 39.2 04/10/2019   MCV 82 04/10/2019   PLT 235 06/15/2016   No results found for: IRON, TIBC, FERRITIN  Attestation Statements:   Reviewed by clinician on day of visit: allergies, medications, problem list, medical history, surgical history, family history, social history, and previous encounter notes.  Time spent on visit including pre-visit chart review and post-visit care and charting was 30 minutes.    I, Trixie Dredge, am acting as transcriptionist for Dennard Nip, MD.  I have  reviewed the above documentation for accuracy and completeness, and I agree with the above. -  Dennard Nip, MD

## 2021-03-26 ENCOUNTER — Ambulatory Visit (INDEPENDENT_AMBULATORY_CARE_PROVIDER_SITE_OTHER): Payer: 59 | Admitting: Psychology

## 2021-03-26 DIAGNOSIS — F4321 Adjustment disorder with depressed mood: Secondary | ICD-10-CM | POA: Diagnosis not present

## 2021-04-13 ENCOUNTER — Encounter (INDEPENDENT_AMBULATORY_CARE_PROVIDER_SITE_OTHER): Payer: Self-pay | Admitting: Family Medicine

## 2021-04-13 ENCOUNTER — Ambulatory Visit (INDEPENDENT_AMBULATORY_CARE_PROVIDER_SITE_OTHER): Payer: 59 | Admitting: Family Medicine

## 2021-04-13 ENCOUNTER — Other Ambulatory Visit: Payer: Self-pay

## 2021-04-13 VITALS — BP 122/76 | HR 67 | Temp 97.9°F | Ht 69.0 in | Wt 272.0 lb

## 2021-04-13 DIAGNOSIS — F3289 Other specified depressive episodes: Secondary | ICD-10-CM | POA: Diagnosis not present

## 2021-04-13 DIAGNOSIS — Z9189 Other specified personal risk factors, not elsewhere classified: Secondary | ICD-10-CM | POA: Diagnosis not present

## 2021-04-13 DIAGNOSIS — Z6841 Body Mass Index (BMI) 40.0 and over, adult: Secondary | ICD-10-CM

## 2021-04-13 DIAGNOSIS — I10 Essential (primary) hypertension: Secondary | ICD-10-CM | POA: Diagnosis not present

## 2021-04-13 MED ORDER — BUPROPION HCL ER (SR) 200 MG PO TB12: 200.0000 mg | ORAL_TABLET | Freq: Two times a day (BID) | ORAL | 0 refills | Status: DC

## 2021-04-14 NOTE — Progress Notes (Signed)
Chief Complaint:   OBESITY Kenneth Green is here to discuss his progress with his obesity treatment plan along with follow-up of his obesity related diagnoses. Kenneth Green is on the Category 4 Plan and states he is following his eating plan approximately 70% of the time. Kenneth Green states he is doing 0 minutes 0 times per week.  Today's visit was #: 59 Starting weight: 308 lbs Starting date: 04/10/2019 Today's weight: 272 lbs Today's date: 04/13/2021 Total lbs lost to date: 36 Total lbs lost since last in-office visit: 3  Interim History: Kenneth Green continues to do well with weight loss. He has been more active at work and with [employer] reenactments over the weekend. He feels he eats better during the weekend.  Subjective:   1. Essential hypertension Kenneth Green blood pressure continues to improve with diet and weight loss. He has no feeling of being lightheaded or dizzy. He is stable on Lotensin HCT.  2. Other depression with emotional eating Kenneth Green is stable on Wellbutrin, and he has no issues with insomnia or elevated blood pressure. He is doing well minimizing emotional eating behaviors.  3. At risk for dehydration Kenneth Green is at risk for dehydration due to inadequate water intake.  Assessment/Plan:   1. Essential hypertension Kenneth Green will continue with diet, exercise, and will continue to increase his water intake. We will watch for signs of hypotension as he continues his lifestyle modifications.  2. Other depression with emotional eating Behavior modification techniques were discussed today to help Kenneth Green deal with his emotional/non-hunger eating behaviors. We will refill Wellbutrin SR for 1 month. Orders and follow up as documented in patient record.   - buPROPion (WELLBUTRIN SR) 200 MG 12 hr tablet; Take 1 tablet (200 mg total) by mouth 2 (two) times daily.  Dispense: 60 tablet; Refill: 0  3. At risk for dehydration Kenneth Green was given approximately 15 minutes dehydration  prevention counseling today. Kenneth Green is at risk for dehydration due to weight loss and current medication(s). He was encouraged to hydrate and monitor fluid status to avoid dehydration as well as weight loss plateaus.   4. Obesity with current BMI 40.2 Kenneth Green is currently in the action stage of change. As such, his goal is to continue with weight loss efforts. He has agreed to the Category 4 Plan.   Behavioral modification strategies: increasing lean protein intake and increasing water intake.  Kenneth Green has agreed to follow-up with our clinic in 3 to 4 weeks. He was informed of the importance of frequent follow-up visits to maximize his success with intensive lifestyle modifications for his multiple health conditions.   Objective:   Blood pressure 122/76, pulse 67, temperature 97.9 F (36.6 C), height 5\' 9"  (1.753 m), weight 272 lb (123.4 kg), SpO2 97 %. Body mass index is 40.17 kg/m.  General: Cooperative, alert, well developed, in no acute distress. HEENT: Conjunctivae and lids unremarkable. Cardiovascular: Regular rhythm.  Lungs: Normal work of breathing. Neurologic: No focal deficits.   Lab Results  Component Value Date   CREATININE 1.05 01/15/2021   BUN 11 01/15/2021   NA 140 01/15/2021   K 4.1 01/15/2021   CL 103 01/15/2021   CO2 23 01/15/2021   Lab Results  Component Value Date   ALT 49 (H) 01/15/2021   AST 43 (H) 01/15/2021   ALKPHOS 70 01/15/2021   BILITOT 0.5 01/15/2021   Lab Results  Component Value Date   HGBA1C 5.9 (H) 01/15/2021   HGBA1C 5.8 (H) 07/23/2020   HGBA1C 5.6 12/19/2019  HGBA1C 5.7 (H) 09/03/2019   HGBA1C 5.7 05/07/2019   Lab Results  Component Value Date   INSULIN 12.8 01/15/2021   INSULIN 18.1 07/23/2020   INSULIN 27.0 (H) 12/19/2019   INSULIN 24.2 09/03/2019   INSULIN 30.9 (H) 04/10/2019   Lab Results  Component Value Date   TSH 1.340 04/10/2019   Lab Results  Component Value Date   CHOL 170 01/15/2021   HDL 46 01/15/2021    LDLCALC 105 (H) 01/15/2021   TRIG 104 01/15/2021   Lab Results  Component Value Date   WBC 6.0 04/10/2019   HGB 13.4 04/10/2019   HCT 39.2 04/10/2019   MCV 82 04/10/2019   PLT 235 06/15/2016   No results found for: IRON, TIBC, FERRITIN  Attestation Statements:   Reviewed by clinician on day of visit: allergies, medications, problem list, medical history, surgical history, family history, social history, and previous encounter notes.   I, Trixie Dredge, am acting as transcriptionist for Dennard Nip, MD.  I have reviewed the above documentation for accuracy and completeness, and I agree with the above. -  Dennard Nip, MD

## 2021-04-15 ENCOUNTER — Ambulatory Visit (INDEPENDENT_AMBULATORY_CARE_PROVIDER_SITE_OTHER): Payer: 59 | Admitting: Psychology

## 2021-04-15 DIAGNOSIS — F411 Generalized anxiety disorder: Secondary | ICD-10-CM

## 2021-05-04 ENCOUNTER — Ambulatory Visit (INDEPENDENT_AMBULATORY_CARE_PROVIDER_SITE_OTHER): Payer: 59 | Admitting: Psychology

## 2021-05-04 DIAGNOSIS — F4321 Adjustment disorder with depressed mood: Secondary | ICD-10-CM | POA: Diagnosis not present

## 2021-05-11 LAB — VITAMIN D 25 HYDROXY (VIT D DEFICIENCY, FRACTURES): Vit D, 25-Hydroxy: 35

## 2021-05-11 LAB — COMPREHENSIVE METABOLIC PANEL
Albumin: 4.1 (ref 3.5–5.0)
Calcium: 9.7 (ref 8.7–10.7)
GFR calc non Af Amer: 74

## 2021-05-11 LAB — LIPID PANEL
Cholesterol: 148 (ref 0–200)
LDL Cholesterol: 84
Triglycerides: 109 (ref 40–160)

## 2021-05-11 LAB — BASIC METABOLIC PANEL
BUN: 16 (ref 4–21)
CO2: 29 — AB (ref 13–22)
Chloride: 103 (ref 99–108)
Creatinine: 1.2 (ref 0.6–1.3)
Glucose: 99
Potassium: 3.9 (ref 3.4–5.3)
Sodium: 138 (ref 137–147)

## 2021-05-11 LAB — HEPATIC FUNCTION PANEL
ALT: 32 (ref 10–40)
AST: 25 (ref 14–40)
Bilirubin, Total: 0.4

## 2021-05-11 LAB — PSA: PSA: 0.21

## 2021-05-14 ENCOUNTER — Other Ambulatory Visit (INDEPENDENT_AMBULATORY_CARE_PROVIDER_SITE_OTHER): Payer: Self-pay | Admitting: Family Medicine

## 2021-05-14 ENCOUNTER — Other Ambulatory Visit: Payer: Self-pay

## 2021-05-14 ENCOUNTER — Ambulatory Visit (INDEPENDENT_AMBULATORY_CARE_PROVIDER_SITE_OTHER): Payer: 59 | Admitting: Family Medicine

## 2021-05-14 ENCOUNTER — Encounter (INDEPENDENT_AMBULATORY_CARE_PROVIDER_SITE_OTHER): Payer: Self-pay | Admitting: Family Medicine

## 2021-05-14 VITALS — BP 150/90 | HR 63 | Temp 97.7°F | Ht 69.0 in | Wt 272.0 lb

## 2021-05-14 DIAGNOSIS — F3289 Other specified depressive episodes: Secondary | ICD-10-CM | POA: Diagnosis not present

## 2021-05-14 DIAGNOSIS — Z9189 Other specified personal risk factors, not elsewhere classified: Secondary | ICD-10-CM

## 2021-05-14 DIAGNOSIS — Z6841 Body Mass Index (BMI) 40.0 and over, adult: Secondary | ICD-10-CM

## 2021-05-14 DIAGNOSIS — I10 Essential (primary) hypertension: Secondary | ICD-10-CM | POA: Diagnosis not present

## 2021-05-14 MED ORDER — BUPROPION HCL ER (SR) 200 MG PO TB12: 200.0000 mg | ORAL_TABLET | Freq: Two times a day (BID) | ORAL | 0 refills | Status: DC

## 2021-05-14 MED ORDER — BD PEN NEEDLE NANO U/F 32G X 4 MM MISC: 0 refills | Status: DC

## 2021-05-14 MED ORDER — SAXENDA 18 MG/3ML ~~LOC~~ SOPN: 3.0000 mg | PEN_INJECTOR | Freq: Every day | SUBCUTANEOUS | 0 refills | Status: DC

## 2021-05-14 NOTE — Telephone Encounter (Signed)
Last OV with Dr. Beasley 

## 2021-05-19 ENCOUNTER — Telehealth (INDEPENDENT_AMBULATORY_CARE_PROVIDER_SITE_OTHER): Payer: Self-pay

## 2021-05-19 NOTE — Telephone Encounter (Signed)
Please have the patient contact his insurance pharmacy benefits and ask if they cover any obesity medications and if so, which ones?

## 2021-05-19 NOTE — Telephone Encounter (Signed)
Pt called in and stated that her was prescription Saxenda, The pt said that his pharmacy reached out and stated that his insurance declined the medicine and that he is calling in asking for a alternative  Medicaine. Please advise

## 2021-05-19 NOTE — Telephone Encounter (Signed)
Last OV with Dr. Beasley 

## 2021-05-20 ENCOUNTER — Encounter (INDEPENDENT_AMBULATORY_CARE_PROVIDER_SITE_OTHER): Payer: Self-pay | Admitting: Emergency Medicine

## 2021-05-20 NOTE — Telephone Encounter (Signed)
Will send patient a Mychart msg for him to contact his insurance pharmacy benefits and ask if they cover any obesity medications and if so, which ones?

## 2021-05-20 NOTE — Progress Notes (Signed)
Chief Complaint:   OBESITY Kenneth Green is here to discuss his progress with his obesity treatment plan along with follow-up of his obesity related diagnoses. Kenneth Green is on the Category 4 Plan and states he is following his eating plan approximately 70% of the time. Kenneth Green states he is walking for 45 minutes 3 times per week.  Today's visit was #: 61 Starting weight: 308 lbs Starting date: 04/10/2019 Today's weight: 272 lbs Today's date: 05/14/2021 Total lbs lost to date: 36 Total lbs lost since last in-office visit: 0  Interim History: Kenneth Green has been struggling with food cravings with increased stress. He is also on prednisone and had his gabapentin changed to Lyrica. He has managed to avoid weight gain which is a reassuring sign.  Subjective:   1. Essential hypertension Kenneth Green's blood pressure is elevated today, but normally well controlled on Lotensin. He is working on diet and exercise, but he has had increased stress.  2. Other depression with emotional eating Kenneth Green has had multiple stressors in his life and he notes increased emotional eating behaviors. He is frustrated that he cant seem to help this.  3. At risk for heart disease Kenneth Green is at a higher than average risk for cardiovascular disease due to obesity.   Assessment/Plan:   1. Essential hypertension Kenneth Green will continue his medications, diet, and exercise to improve blood pressure control. We will recheck his blood pressure in 3 weeks, and will watch for signs of hypotension as he continues his lifestyle modifications.  2. Other depression with emotional eating Emotional eating strategies were discussed today to help Kenneth Green deal with his emotional/non-hunger eating behaviors. We will refill Wellbutrin SR for 1 month. Orders and follow up as documented in patient record.   - buPROPion (WELLBUTRIN SR) 200 MG 12 hr tablet; Take 1 tablet (200 mg total) by mouth 2 (two) times daily.  Dispense: 60 tablet; Refill:  0  3. At risk for heart disease Kenneth Green was given approximately 15 minutes of coronary artery disease prevention counseling today. He is 55 y.o. male and has risk factors for heart disease including obesity. We discussed intensive lifestyle modifications today with an emphasis on specific weight loss instructions and strategies.   Repetitive spaced learning was employed today to elicit superior memory formation and behavioral change.  4. Obesity with current BMI 40.2 Kenneth Green is currently in the action stage of change. As such, his goal is to continue with weight loss efforts. He has agreed to the Category 4 Plan.   We discussed various medication options to help Kenneth Green with his weight loss efforts and we both agreed to start Saxenda 3.0 mg with no refills (He is to start at 0.6 mg), and nano needles #100 with no refills.  - Liraglutide -Weight Management (SAXENDA) 18 MG/3ML SOPN; Inject 3 mg into the skin daily at 6 (six) AM. Follow titration direction with patient starting at 0.6 mg daily  Dispense: 15 mL; Refill: 0 - Insulin Pen Needle (BD PEN NEEDLE NANO U/F) 32G X 4 MM MISC; Use Nano needle with Ozempic  Dispense: 100 each; Refill: 0  Exercise goals: As is.  Behavioral modification strategies: emotional eating strategies.  Kenneth Green has agreed to follow-up with our clinic in 3 weeks. He was informed of the importance of frequent follow-up visits to maximize his success with intensive lifestyle modifications for his multiple health conditions.   Objective:   Blood pressure (!) 150/90, pulse 63, temperature 97.7 F (36.5 C), height 5\' 9"  (1.753 m), weight  272 lb (123.4 kg), SpO2 97 %. Body mass index is 40.17 kg/m.  General: Cooperative, alert, well developed, in no acute distress. HEENT: Conjunctivae and lids unremarkable. Cardiovascular: Regular rhythm.  Lungs: Normal work of breathing. Neurologic: No focal deficits.   Lab Results  Component Value Date   CREATININE 1.05  01/15/2021   BUN 11 01/15/2021   NA 140 01/15/2021   K 4.1 01/15/2021   CL 103 01/15/2021   CO2 23 01/15/2021   Lab Results  Component Value Date   ALT 49 (H) 01/15/2021   AST 43 (H) 01/15/2021   ALKPHOS 70 01/15/2021   BILITOT 0.5 01/15/2021   Lab Results  Component Value Date   HGBA1C 5.9 (H) 01/15/2021   HGBA1C 5.8 (H) 07/23/2020   HGBA1C 5.6 12/19/2019   HGBA1C 5.7 (H) 09/03/2019   HGBA1C 5.7 05/07/2019   Lab Results  Component Value Date   INSULIN 12.8 01/15/2021   INSULIN 18.1 07/23/2020   INSULIN 27.0 (H) 12/19/2019   INSULIN 24.2 09/03/2019   INSULIN 30.9 (H) 04/10/2019   Lab Results  Component Value Date   TSH 1.340 04/10/2019   Lab Results  Component Value Date   CHOL 170 01/15/2021   HDL 46 01/15/2021   LDLCALC 105 (H) 01/15/2021   TRIG 104 01/15/2021   Lab Results  Component Value Date   VD25OH 34.3 01/15/2021   VD25OH 51.2 09/03/2020   VD25OH 44.4 07/23/2020   Lab Results  Component Value Date   WBC 6.0 04/10/2019   HGB 13.4 04/10/2019   HCT 39.2 04/10/2019   MCV 82 04/10/2019   PLT 235 06/15/2016   No results found for: IRON, TIBC, FERRITIN  Attestation Statements:   Reviewed by clinician on day of visit: allergies, medications, problem list, medical history, surgical history, family history, social history, and previous encounter notes.   I, Trixie Dredge, am acting as transcriptionist for Dennard Nip, MD.  I have reviewed the above documentation for accuracy and completeness, and I agree with the above. -  Dennard Nip, MD

## 2021-05-26 ENCOUNTER — Ambulatory Visit (INDEPENDENT_AMBULATORY_CARE_PROVIDER_SITE_OTHER): Payer: 59 | Admitting: Psychology

## 2021-05-26 DIAGNOSIS — F4321 Adjustment disorder with depressed mood: Secondary | ICD-10-CM

## 2021-06-04 ENCOUNTER — Ambulatory Visit (INDEPENDENT_AMBULATORY_CARE_PROVIDER_SITE_OTHER): Payer: 59 | Admitting: Family Medicine

## 2021-06-04 ENCOUNTER — Other Ambulatory Visit: Payer: Self-pay

## 2021-06-04 ENCOUNTER — Encounter (INDEPENDENT_AMBULATORY_CARE_PROVIDER_SITE_OTHER): Payer: Self-pay | Admitting: Family Medicine

## 2021-06-04 VITALS — BP 149/87 | HR 67 | Temp 98.0°F | Ht 69.0 in | Wt 268.0 lb

## 2021-06-04 DIAGNOSIS — F3289 Other specified depressive episodes: Secondary | ICD-10-CM

## 2021-06-04 DIAGNOSIS — Z9189 Other specified personal risk factors, not elsewhere classified: Secondary | ICD-10-CM | POA: Diagnosis not present

## 2021-06-04 DIAGNOSIS — Z6841 Body Mass Index (BMI) 40.0 and over, adult: Secondary | ICD-10-CM | POA: Diagnosis not present

## 2021-06-04 DIAGNOSIS — I10 Essential (primary) hypertension: Secondary | ICD-10-CM

## 2021-06-04 MED ORDER — BUPROPION HCL ER (SR) 200 MG PO TB12: 200.0000 mg | ORAL_TABLET | Freq: Two times a day (BID) | ORAL | 0 refills | Status: DC

## 2021-06-15 ENCOUNTER — Ambulatory Visit (INDEPENDENT_AMBULATORY_CARE_PROVIDER_SITE_OTHER): Payer: 59 | Admitting: Psychology

## 2021-06-15 DIAGNOSIS — F4321 Adjustment disorder with depressed mood: Secondary | ICD-10-CM

## 2021-06-15 NOTE — Progress Notes (Signed)
Chief Complaint:   OBESITY Kenneth Green is here to discuss his progress with his obesity treatment plan along with follow-up of his obesity related diagnoses. Kenneth Green is on the Category 4 Plan and states he is following his eating plan approximately 80% of the time. Kenneth Green states he is walking for 45 minutes 4-5 times per week.  Today's visit was #: 72 Starting weight: 308 lbs Starting date: 04/10/2019 Today's weight: 268 lbs Today's date: 06/04/2021 Total lbs lost to date: 40 Total lbs lost since last in-office visit: 4  Interim History: Kenneth Green continues to do well with weight loss on his Category 4 plan. His hunger is mostly controlled. We prescribed Saxenda but his insurance refused to cover medications for obesity.  Subjective:   1. Essential hypertension Kenneth Green's blood pressure is elevated again, even with his continued weight loss. He denies chest pain or headache.  2. Other depression with emotional eating Kenneth Green is stable on Wellbutrin, and he is working decreasing emotional eating behaviors. He is doing well overall.  3. At risk for heart disease Kenneth Green is at a higher than average risk for cardiovascular disease due to obesity.   Assessment/Plan:   1. Essential hypertension Kenneth Green will continue diet, exercise, and his medications to improve blood pressure control. We will recheck his blood pressure in 3-4 weeks. We will watch for signs of hypotension as he continues his lifestyle modifications.  2. Other depression with emotional eating Behavior modification techniques were discussed today to help Kenneth Green deal with his emotional/non-hunger eating behaviors. We will refill Wellbutrin SR for 1 month. Orders and follow up as documented in patient record.   - buPROPion (WELLBUTRIN SR) 200 MG 12 hr tablet; Take 1 tablet (200 mg total) by mouth 2 (two) times daily.  Dispense: 60 tablet; Refill: 0  3. At risk for heart disease Kenneth Green was given approximately 15 minutes  of coronary artery disease prevention counseling today. He is 55 y.o. male and has risk factors for heart disease including obesity. We discussed intensive lifestyle modifications today with an emphasis on specific weight loss instructions and strategies.   Repetitive spaced learning was employed today to elicit superior memory formation and behavioral change.  4. Obesity with current BMI 39.6 Kenneth Green is currently in the action stage of change. As such, his goal is to continue with weight loss efforts. He has agreed to the Category 4 Plan.   Exercise goals: As is.  Behavioral modification strategies: increasing water intake.  Kenneth Green has agreed to follow-up with our clinic in 3 to 4 weeks. He was informed of the importance of frequent follow-up visits to maximize his success with intensive lifestyle modifications for his multiple health conditions.   Objective:   Blood pressure (!) 149/87, pulse 67, temperature 98 F (36.7 C), height '5\' 9"'$  (1.753 m), weight 268 lb (121.6 kg), SpO2 98 %. Body mass index is 39.58 kg/m.  General: Cooperative, alert, well developed, in no acute distress. HEENT: Conjunctivae and lids unremarkable. Cardiovascular: Regular rhythm.  Lungs: Normal work of breathing. Neurologic: No focal deficits.   Lab Results  Component Value Date   CREATININE 1.2 05/11/2021   BUN 16 05/11/2021   NA 138 05/11/2021   K 3.9 05/11/2021   CL 103 05/11/2021   CO2 29 (A) 05/11/2021   Lab Results  Component Value Date   ALT 32 05/11/2021   AST 25 05/11/2021   ALKPHOS 70 01/15/2021   BILITOT 0.5 01/15/2021   Lab Results  Component Value Date  HGBA1C 5.9 (H) 01/15/2021   HGBA1C 5.8 (H) 07/23/2020   HGBA1C 5.6 12/19/2019   HGBA1C 5.7 (H) 09/03/2019   HGBA1C 5.7 05/07/2019   Lab Results  Component Value Date   INSULIN 12.8 01/15/2021   INSULIN 18.1 07/23/2020   INSULIN 27.0 (H) 12/19/2019   INSULIN 24.2 09/03/2019   INSULIN 30.9 (H) 04/10/2019   Lab Results   Component Value Date   TSH 1.340 04/10/2019   Lab Results  Component Value Date   CHOL 148 05/11/2021   HDL 46 01/15/2021   LDLCALC 84 05/11/2021   TRIG 109 05/11/2021   Lab Results  Component Value Date   VD25OH 35.0 05/11/2021   VD25OH 34.3 01/15/2021   VD25OH 51.2 09/03/2020   Lab Results  Component Value Date   WBC 6.0 04/10/2019   HGB 13.4 04/10/2019   HCT 39.2 04/10/2019   MCV 82 04/10/2019   PLT 235 06/15/2016   No results found for: IRON, TIBC, FERRITIN  Attestation Statements:   Reviewed by clinician on day of visit: allergies, medications, problem list, medical history, surgical history, family history, social history, and previous encounter notes.   I, Trixie Dredge, am acting as transcriptionist for Dennard Nip, MD.  I have reviewed the above documentation for accuracy and completeness, and I agree with the above. -  Dennard Nip, MD

## 2021-06-24 ENCOUNTER — Other Ambulatory Visit: Payer: Self-pay

## 2021-06-24 ENCOUNTER — Encounter (INDEPENDENT_AMBULATORY_CARE_PROVIDER_SITE_OTHER): Payer: Self-pay | Admitting: Family Medicine

## 2021-06-24 ENCOUNTER — Ambulatory Visit (INDEPENDENT_AMBULATORY_CARE_PROVIDER_SITE_OTHER): Payer: 59 | Admitting: Family Medicine

## 2021-06-24 VITALS — BP 137/88 | HR 69 | Temp 97.8°F | Ht 69.0 in | Wt 264.0 lb

## 2021-06-24 DIAGNOSIS — F3289 Other specified depressive episodes: Secondary | ICD-10-CM | POA: Diagnosis not present

## 2021-06-24 DIAGNOSIS — Z9189 Other specified personal risk factors, not elsewhere classified: Secondary | ICD-10-CM | POA: Diagnosis not present

## 2021-06-24 DIAGNOSIS — Z6841 Body Mass Index (BMI) 40.0 and over, adult: Secondary | ICD-10-CM | POA: Diagnosis not present

## 2021-06-24 DIAGNOSIS — I10 Essential (primary) hypertension: Secondary | ICD-10-CM | POA: Diagnosis not present

## 2021-06-24 DIAGNOSIS — E66813 Obesity, class 3: Secondary | ICD-10-CM

## 2021-06-24 MED ORDER — BUPROPION HCL ER (SR) 200 MG PO TB12: 200.0000 mg | ORAL_TABLET | Freq: Two times a day (BID) | ORAL | 0 refills | Status: DC

## 2021-06-25 NOTE — Progress Notes (Signed)
Chief Complaint:   OBESITY Kenneth Green is here to discuss his progress with his obesity treatment plan along with follow-up of his obesity related diagnoses. [name] is on the Category 4 Plan and states he is following his eating plan approximately 85% of the time. [name] states he is walking for 45 minutes 4 times per week.  Today's visit was #: 33 Starting weight: 308 lbs Starting date: 04/10/2019 Today's weight: 264 lbs Today's date: 06/24/2021 Total lbs lost to date: 34 Total lbs lost since last in-office visit: 4  Interim History: Kenneth Green continues to do well with weight loss and is at his lowest weight since starting the program. His hunger is controlled and he is struggling active.  Subjective:   1. Essential hypertension Kenneth Green on Lotensin, and the last 2 readings have been elevated, but his blood pressure is back in range today. He is working on diet, exercise, weight loss.  2. Other depression with emotional eating Kenneth Green's mood is stable on Wellbutrin. He is doing well with diet and weight loss. He denies insomnia and her blood pressure is at goal today.  3. At risk for impaired metabolic function Kenneth Green is at increased risk for impaired metabolic function due to current nutrition and muscle mass.  Assessment/Plan:   1. Essential hypertension [name] will continue with diet and his medications as is to improve blood pressure control. We will watch for signs of hypotension as he continues his lifestyle modifications.  2. Other depression with emotional eating Behavior modification techniques were discussed today to help [name] deal with his emotional/non-hunger eating behaviors. We will refill Wellbutrin SR for 1 month. Orders and follow up as documented in patient record.   - buPROPion (WELLBUTRIN SR) 200 MG 12 hr tablet; Take 1 tablet (200 mg total) by mouth 2 (two) times daily.  Dispense: 60 tablet; Refill: 0  3. At risk for impaired metabolic function Kenneth Green  was given approximately 15 minutes of impaired  metabolic function prevention counseling today. We discussed intensive lifestyle modifications today with an emphasis on specific nutrition and exercise instructions and strategies.   Repetitive spaced learning was employed today to elicit superior memory formation and behavioral change.  4. Obesity with current BMI 39.0 Kenneth Green is currently in the action stage of change. As such, his goal is to continue with weight loss efforts. He has agreed to keeping a food journal and adhering to recommended goals of 1800 calories and 100 grams of protein daily.   Exercise goals: As is, core exercise was discussed.  Behavioral modification strategies: increasing lean protein intake.  Artez has agreed to follow-up with our clinic in 2 to 3 weeks. He was informed of the importance of frequent follow-up visits to maximize his success with intensive lifestyle modifications for his multiple health conditions.   Objective:   Blood pressure 137/88, pulse 69, temperature 97.8 F (36.6 C), height '5\' 9"'$  (1.753 m), weight 264 lb (119.7 kg), SpO2 100 %. Body mass index is 38.99 kg/m.  General: Cooperative, alert, well developed, in no acute distress. HEENT: Conjunctivae and lids unremarkable. Cardiovascular: Regular rhythm.  Lungs: Normal work of breathing. Neurologic: No focal deficits.   Lab Results  Component Value Date   CREATININE 1.2 05/11/2021   BUN 16 05/11/2021   NA 138 05/11/2021   K 3.9 05/11/2021   CL 103 05/11/2021   CO2 29 (A) 05/11/2021   Lab Results  Component Value Date   ALT 32 05/11/2021   AST 25 05/11/2021  ALKPHOS 70 01/15/2021   BILITOT 0.5 01/15/2021   Lab Results  Component Value Date   HGBA1C 5.9 (H) 01/15/2021   HGBA1C 5.8 (H) 07/23/2020   HGBA1C 5.6 12/19/2019   HGBA1C 5.7 (H) 09/03/2019   HGBA1C 5.7 05/07/2019   Lab Results  Component Value Date   INSULIN 12.8 01/15/2021   INSULIN 18.1 07/23/2020   INSULIN  27.0 (H) 12/19/2019   INSULIN 24.2 09/03/2019   INSULIN 30.9 (H) 04/10/2019   Lab Results  Component Value Date   TSH 1.340 04/10/2019   Lab Results  Component Value Date   CHOL 148 05/11/2021   HDL 46 01/15/2021   LDLCALC 84 05/11/2021   TRIG 109 05/11/2021   Lab Results  Component Value Date   VD25OH 35.0 05/11/2021   VD25OH 34.3 01/15/2021   VD25OH 51.2 09/03/2020   Lab Results  Component Value Date   WBC 6.0 04/10/2019   HGB 13.4 04/10/2019   HCT 39.2 04/10/2019   MCV 82 04/10/2019   PLT 235 06/15/2016   No results found for: IRON, TIBC, FERRITIN  Attestation Statements:   Reviewed by clinician on day of visit: allergies, medications, problem list, medical history, surgical history, family history, social history, and previous encounter notes.   I, Trixie Dredge, am acting as transcriptionist for Dennard Nip, MD.  I have reviewed the above documentation for accuracy and completeness, and I agree with the above. -  Dennard Nip, MD

## 2021-06-26 ENCOUNTER — Ambulatory Visit (INDEPENDENT_AMBULATORY_CARE_PROVIDER_SITE_OTHER): Payer: 59 | Admitting: Psychology

## 2021-06-26 DIAGNOSIS — F4321 Adjustment disorder with depressed mood: Secondary | ICD-10-CM | POA: Diagnosis not present

## 2021-07-03 ENCOUNTER — Ambulatory Visit: Payer: 59 | Admitting: Psychology

## 2021-07-13 ENCOUNTER — Ambulatory Visit (INDEPENDENT_AMBULATORY_CARE_PROVIDER_SITE_OTHER): Payer: 59 | Admitting: Psychology

## 2021-07-13 DIAGNOSIS — F4321 Adjustment disorder with depressed mood: Secondary | ICD-10-CM | POA: Diagnosis not present

## 2021-07-15 ENCOUNTER — Other Ambulatory Visit (INDEPENDENT_AMBULATORY_CARE_PROVIDER_SITE_OTHER): Payer: Self-pay | Admitting: Family Medicine

## 2021-07-15 ENCOUNTER — Other Ambulatory Visit (HOSPITAL_COMMUNITY): Payer: Self-pay

## 2021-07-15 ENCOUNTER — Encounter (INDEPENDENT_AMBULATORY_CARE_PROVIDER_SITE_OTHER): Payer: Self-pay | Admitting: Family Medicine

## 2021-07-15 ENCOUNTER — Other Ambulatory Visit: Payer: Self-pay

## 2021-07-15 ENCOUNTER — Ambulatory Visit (INDEPENDENT_AMBULATORY_CARE_PROVIDER_SITE_OTHER): Payer: 59 | Admitting: Family Medicine

## 2021-07-15 VITALS — BP 133/80 | HR 78 | Temp 98.4°F | Ht 69.0 in | Wt 268.0 lb

## 2021-07-15 DIAGNOSIS — F3289 Other specified depressive episodes: Secondary | ICD-10-CM

## 2021-07-15 DIAGNOSIS — Z6841 Body Mass Index (BMI) 40.0 and over, adult: Secondary | ICD-10-CM | POA: Diagnosis not present

## 2021-07-15 DIAGNOSIS — R7303 Prediabetes: Secondary | ICD-10-CM

## 2021-07-15 MED ORDER — TIRZEPATIDE 2.5 MG/0.5ML ~~LOC~~ SOAJ
2.5000 mg | SUBCUTANEOUS | 0 refills | Status: DC
  Filled 2021-07-15: qty 2, 28d supply, fill #0

## 2021-07-15 MED ORDER — BUPROPION HCL ER (SR) 200 MG PO TB12: 200.0000 mg | ORAL_TABLET | Freq: Two times a day (BID) | ORAL | 0 refills | Status: DC

## 2021-07-15 MED ORDER — TIRZEPATIDE 2.5 MG/0.5ML ~~LOC~~ SOAJ: 2.5000 mg | SUBCUTANEOUS | 0 refills | Status: DC

## 2021-07-16 NOTE — Progress Notes (Signed)
Chief Complaint:   OBESITY Kenneth Green is here to discuss his progress with his obesity treatment plan along with follow-up of his obesity related diagnoses. Kenneth Green is on keeping a food journal and adhering to recommended goals of 1800 calories and 100 grams of protein daily and states he is following his eating plan approximately 90% of the time. Kenneth Green states he is walking and doing physical therapy for 45 minutes 4 times per week.  Today's visit was #: 83 Starting weight: 308 lbs Starting date: 04/10/2019 Today's weight: 268 lbs Today's date: 07/15/2021 Total lbs lost to date: 40 Total lbs lost since last in-office visit: 0  Interim History: Kenneth Green has had more stressors and he has done more stress eating regularly.  Subjective:   1. Pre-diabetes Kenneth Green is working on diet and weight loss. He is working on decreasing simple carbohydrates, but he has increased stress eating.  2. Other depression with emotional eating Kenneth Green notes some increased emotional eating behaviors, but he is mindful and is working on reducing this.  Assessment/Plan:   1. Pre-diabetes Kenneth Green greed to start Mounjaro 2.5 mg q weekly with no refills. He will continue to work on weight loss, exercise, and decreasing simple carbohydrates to help decrease the risk of diabetes.   - tirzepatide [facility]) 2.5 MG/0.5ML Pen; Inject 2.5 mg into the skin once a week.  Dispense: 2 mL; Refill: 0  2. Other depression with emotional eating Behavior modification techniques were discussed today to help Loren deal with his emotional/non-hunger eating behaviors. We will refill Wellbutrin SR for 1 month. Orders and follow up as documented in patient record.   - buPROPion (WELLBUTRIN SR) 200 MG 12 hr tablet; Take 1 tablet (200 mg total) by mouth 2 (two) times daily.  Dispense: 60 tablet; Refill: 0  3. Obesity with current BMI 39.6 Kenneth Green is currently in the action stage of change. As such, his goal is to continue with  weight loss efforts. He has agreed to keeping a food journal and adhering to recommended goals of 1800 calories and 100 grams of protein daily.   Exercise goals: As is.  Behavioral modification strategies: emotional eating strategies.  Kenneth Green has agreed to follow-up with our clinic in 3 weeks. He was informed of the importance of frequent follow-up visits to maximize his success with intensive lifestyle modifications for his multiple health conditions.   Objective:   Blood pressure 133/80, pulse 78, temperature 98.4 F (36.9 C), height '5\' 9"'$  (1.753 m), weight 268 lb (121.6 kg), SpO2 98 %. Body mass index is 39.58 kg/m.  General: Cooperative, alert, well developed, in no acute distress. HEENT: Conjunctivae and lids unremarkable. Cardiovascular: Regular rhythm.  Lungs: Normal work of breathing. Neurologic: No focal deficits.   Lab Results  Component Value Date   CREATININE 1.2 05/11/2021   BUN 16 05/11/2021   NA 138 05/11/2021   K 3.9 05/11/2021   CL 103 05/11/2021   CO2 29 (A) 05/11/2021   Lab Results  Component Value Date   ALT 32 05/11/2021   AST 25 05/11/2021   ALKPHOS 70 01/15/2021   BILITOT 0.5 01/15/2021   Lab Results  Component Value Date   HGBA1C 5.9 (H) 01/15/2021   HGBA1C 5.8 (H) 07/23/2020   HGBA1C 5.6 12/19/2019   HGBA1C 5.7 (H) 09/03/2019   HGBA1C 5.7 05/07/2019   Lab Results  Component Value Date   INSULIN 12.8 01/15/2021   INSULIN 18.1 07/23/2020   INSULIN 27.0 (H) 12/19/2019   INSULIN 24.2 09/03/2019  INSULIN 30.9 (H) 04/10/2019   Lab Results  Component Value Date   TSH 1.340 04/10/2019   Lab Results  Component Value Date   CHOL 148 05/11/2021   HDL 46 01/15/2021   LDLCALC 84 05/11/2021   TRIG 109 05/11/2021   Lab Results  Component Value Date   VD25OH 35.0 05/11/2021   VD25OH 34.3 01/15/2021   VD25OH 51.2 09/03/2020   Lab Results  Component Value Date   WBC 6.0 04/10/2019   HGB 13.4 04/10/2019   HCT 39.2 04/10/2019   MCV 82  04/10/2019   PLT 235 06/15/2016   No results found for: IRON, TIBC, FERRITIN  Attestation Statements:   Reviewed by clinician on day of visit: allergies, medications, problem list, medical history, surgical history, family history, social history, and previous encounter notes.  Time spent on visit including pre-visit chart review and post-visit care and charting was 43 minutes.    I, Trixie Dredge, am acting as transcriptionist for Dennard Nip, MD.  I have reviewed the above documentation for accuracy and completeness, and I agree with the above. -  Dennard Nip, MD

## 2021-08-07 ENCOUNTER — Ambulatory Visit (INDEPENDENT_AMBULATORY_CARE_PROVIDER_SITE_OTHER): Payer: 59 | Admitting: Behavioral Health

## 2021-08-07 ENCOUNTER — Other Ambulatory Visit: Payer: Self-pay

## 2021-08-07 ENCOUNTER — Encounter: Payer: Self-pay | Admitting: Behavioral Health

## 2021-08-07 VITALS — BP 132/84 | HR 72 | Ht 69.0 in | Wt 276.0 lb

## 2021-08-07 DIAGNOSIS — F411 Generalized anxiety disorder: Secondary | ICD-10-CM | POA: Diagnosis not present

## 2021-08-07 DIAGNOSIS — Z638 Other specified problems related to primary support group: Secondary | ICD-10-CM

## 2021-08-07 DIAGNOSIS — F331 Major depressive disorder, recurrent, moderate: Secondary | ICD-10-CM | POA: Diagnosis not present

## 2021-08-07 MED ORDER — SERTRALINE HCL 50 MG PO TABS: ORAL_TABLET | ORAL | 1 refills | Status: DC

## 2021-08-07 NOTE — Progress Notes (Signed)
[facility] MD/PA/NP Initial Note  08/07/2021 1:11 PM Kenneth Green  MRN:  [id number]  Chief Complaint:  Chief Complaint   Anxiety; Depression; Establish Care; Medication Problem; Family Problem; Stress     HPI:  55 year old male presents to this office for initial visit and to establish care. He says that he is seeing Dr. Dennison Bulla for therapy. Says that he has so many factors and complex dynamics that made him seek out therapy for depression and anxiety. He is primary caregiver for spouse diagnosed with fibromyalgia, and Rheumatoid arthritis. She is 19 years older than him. She has pending hip replacement surgery. He says that he works full time job and does everything to take care of home and wife. Says that she stays in the bed most of day. Says he is also taking care of his elderly moms house as well. He declares that he is a loyal and dedicated husband and worker but he is worried that all these things are becoming more than he can bare. He feels depressed and worries about many different things. He feels that he does not have enough time to take care of himself. He endorses stress eating, trouble losing weight, racing thoughts, and lack of concentration. He says that he sometimes feels inadequate and that he is not smart or good enough. Says he craves to be validated. He says his anxiety today is 6/10 and depression 5/10. He does sleep 7-8 hours per night. He agrees that he is at the place where he feels medication therapy is warranted. He denies mania, no psychosis. No SI/HI  No Past medication failures        Visit Diagnosis:    ICD-10-CM   1. Generalized anxiety disorder  F41.1 sertraline (ZOLOFT) 50 MG tablet    2. Major depressive disorder, recurrent episode, moderate (HCC)  F33.1 sertraline (ZOLOFT) 50 MG tablet    3. Caregiver role strain  Z63.8       Past Psychiatric History:None  Past Medical History:  Past Medical History:  Diagnosis Date   Anxiety    Back  pain    Binge eating disorder    Dyspnea    Glaucoma    Hypertension    Lower extremity edema    Major depressive disorder, recurrent, unspecified ([place])    Mixed hyperlipidemia    Obesity    OSA (obstructive sleep apnea)    Prediabetes    Slow transit constipation     Past Surgical History:  Procedure Laterality Date   SPINAL FUSION      Family Psychiatric History: see chart  Family History:  Family History  Problem Relation Age of Onset   Heart disease Mother    Hypertension Mother    Kidney disease Mother    Obesity Mother    Stroke Father    Hypertension Father    Heart disease Father    Sleep apnea Father    Obesity Father    Heart disease Sister    Diabetes Sister     Social History:  Social History   Socioeconomic History   Marital status: Married    Spouse name: [name]    Number of children: 2   Years of education: Not on file   Highest education level: Associate degree: academic program  Occupational History   Occupation: Public house manager  Tobacco Use   Smoking status: Former   Smokeless tobacco: Never  Substance and Sexual Activity   Alcohol use: Yes    Comment: 2-3  beers weekly   Drug use: No   Sexual activity: Not Currently  Other Topics Concern   Not on file  Social History Narrative   Live with wife in [place]. 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    Social Determinants of Health   Financial Resource Strain: Not on file  Food Insecurity: Not on file  Transportation Needs: Not on file  Physical Activity: Not on file  Stress: Not on file  Social Connections: Not on file    Allergies:  Allergies  Allergen Reactions   Iohexol Hives and Rash    Patient needs pre medication per Dr. Zigmund Daniel    Metabolic Disorder Labs: Lab Results  Component Value Date   HGBA1C 5.9 (H) 01/15/2021   No results found for: PROLACTIN Lab Results  Component Value Date   CHOL 148 05/11/2021   TRIG 109 05/11/2021    HDL 46 01/15/2021   LDLCALC 84 05/11/2021   LDLCALC 105 (H) 01/15/2021   Lab Results  Component Value Date   TSH 1.340 04/10/2019    Therapeutic Level Labs: No results found for: LITHIUM No results found for: VALPROATE No components found for:  CBMZ  Current Medications: Current Outpatient Medications  Medication Sig Dispense Refill   benazepril-hydrochlorthiazide (LOTENSIN HCT) 10-12.5 MG tablet Take 1 tablet by mouth 2 (two) times daily. 60 tablet 0   buPROPion (WELLBUTRIN SR) 200 MG 12 hr tablet Take 1 tablet (200 mg total) by mouth 2 (two) times daily. 60 tablet 0   fexofenadine (ALLEGRA) 180 MG tablet Take 180 mg by mouth daily.     pregabalin (LYRICA) 150 MG capsule Take 150 mg by mouth 2 (two) times daily.     sertraline (ZOLOFT) 50 MG tablet Take 1/2 tablet 25 mg daily for 7 days, then take one whole tablet 50 mg total daily. 30 tablet 1   tirzepatide (MOUNJARO) 2.5 MG/0.5ML Pen Inject 2.5 mg into the skin once a week. 2 mL 0   Travoprost, BAK Free, (TRAVATAN) 0.004 % SOLN ophthalmic solution Place 1 drop into both eyes at bedtime.     No current facility-administered medications for this visit.    Medication Side Effects: none  Orders placed this visit:  No orders of the defined types were placed in this encounter.   Psychiatric Specialty Exam:  Review of Systems  HENT:  Positive for hearing loss and tinnitus.   Musculoskeletal:  Positive for back pain and neck pain.  Neurological:  Positive for speech difficulty.  Hematological:  Bruises/bleeds easily.   There were no vitals taken for this visit.There is no height or weight on file to calculate BMI.  General Appearance:  Neat, well groomed  Eye Contact:  Good  Speech:  Clear and Coherent  Volume:  Normal  Mood:  Anxious and Depressed  Affect:  Appropriate, Non-Congruent, and Anxious  Thought Process:  Coherent  Orientation:  Full (Time, Place, and Person)  Thought Content: Logical   Suicidal Thoughts:  No   Homicidal Thoughts:  No  Memory:  WNL  Judgement:  Good  Insight:  Good  Psychomotor Activity:  Normal  Concentration:  Concentration: Good  Recall:  Good  Fund of Knowledge: Good  Language: Good  Assets:  Desire for Improvement Physical Health Resilience Social Support  ADL's:  Intact  Cognition: WNL  Prognosis:  Good   Screenings:  GAD-7    Flowsheet Row Office Visit from 08/07/2021 in [place] [facility]  Total GAD-7 Score 15      PHQ2-9    [place] Visit from 08/07/2021 in [facility] Visit from 04/10/2019 in [place]  PHQ-2 Total Score 1 5  PHQ-9 Total Score -- 18       Receiving Psychotherapy: Yes Dennison Bulla  Treatment Plan/Recommendations:  Greater than 50% of  60 min face to face time with patient was spent on counseling and coordination of care. We discussed his his history of anxiety and depression. He went over 20 years on Wellbutrin without medication change. He originally went on medication to quit smoking but just stayed on medication. 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 Discussed family dynamics and the heavy demand on self. He will continue psychotherapy with Dennison Bulla. We discussed the following plan: Will start Zoloft 25 mg for 7 days, then will increase to 50 mg daily. To report side effects or worsening symptoms promptly Provided emergency contact information To follow up in 4 weeks to reassess. Reviewed PDMP    Elwanda Brooklyn, NP

## 2021-08-12 ENCOUNTER — Ambulatory Visit (INDEPENDENT_AMBULATORY_CARE_PROVIDER_SITE_OTHER): Payer: 59 | Admitting: Family Medicine

## 2021-08-17 ENCOUNTER — Ambulatory Visit (INDEPENDENT_AMBULATORY_CARE_PROVIDER_SITE_OTHER): Payer: 59 | Admitting: Psychology

## 2021-08-17 DIAGNOSIS — F4321 Adjustment disorder with depressed mood: Secondary | ICD-10-CM | POA: Diagnosis not present

## 2021-08-19 ENCOUNTER — Encounter (INDEPENDENT_AMBULATORY_CARE_PROVIDER_SITE_OTHER): Payer: Self-pay | Admitting: Family Medicine

## 2021-08-19 ENCOUNTER — Other Ambulatory Visit (INDEPENDENT_AMBULATORY_CARE_PROVIDER_SITE_OTHER): Payer: Self-pay | Admitting: Family Medicine

## 2021-08-19 DIAGNOSIS — R7303 Prediabetes: Secondary | ICD-10-CM

## 2021-08-19 NOTE — Telephone Encounter (Signed)
Last OV with Dr. Beasley 

## 2021-08-20 ENCOUNTER — Encounter (INDEPENDENT_AMBULATORY_CARE_PROVIDER_SITE_OTHER): Payer: Self-pay | Admitting: Family Medicine

## 2021-08-20 NOTE — Telephone Encounter (Signed)
Pt last seen by Dr. Beasley.  

## 2021-08-21 ENCOUNTER — Other Ambulatory Visit (INDEPENDENT_AMBULATORY_CARE_PROVIDER_SITE_OTHER): Payer: Self-pay | Admitting: Family Medicine

## 2021-08-21 ENCOUNTER — Other Ambulatory Visit (HOSPITAL_COMMUNITY): Payer: Self-pay

## 2021-08-21 DIAGNOSIS — R7303 Prediabetes: Secondary | ICD-10-CM

## 2021-08-24 ENCOUNTER — Other Ambulatory Visit (INDEPENDENT_AMBULATORY_CARE_PROVIDER_SITE_OTHER): Payer: Self-pay | Admitting: Family Medicine

## 2021-08-24 ENCOUNTER — Other Ambulatory Visit (HOSPITAL_COMMUNITY): Payer: Self-pay

## 2021-08-24 DIAGNOSIS — R7303 Prediabetes: Secondary | ICD-10-CM

## 2021-08-24 NOTE — Telephone Encounter (Signed)
LAST APPOINTMENT DATE: 07/15/21 NEXT APPOINTMENT DATE: 08/26/21 patient is due for his next shot today. He started a week late he stated   [id number] - [place], [address] [address] Phone: [phone] Fax: [phone]  [place] [address] Phone: [phone] Fax: [phone]  Patient is requesting a refill of the following medications: Requested Prescriptions    No prescriptions requested or ordered in this encounter    Date last filled: 07/15/21 Previously prescribed by [facility]  Lab Results  Component Value Date   HGBA1C 5.9 (H) 01/15/2021   HGBA1C 5.8 (H) 07/23/2020   HGBA1C 5.6 12/19/2019   Lab Results  Component Value Date   MICROALBUR <0.7 05/07/2020   LDLCALC 84 05/11/2021   CREATININE 1.2 05/11/2021   Lab Results  Component Value Date   VD25OH 35.0 05/11/2021   VD25OH 34.3 01/15/2021   VD25OH 51.2 09/03/2020    BP Readings from Last 3 Encounters:  07/15/21 133/80  06/24/21 137/88  06/04/21 (!) 149/87

## 2021-08-24 NOTE — Telephone Encounter (Signed)
Last OV with Dr. Beasley 

## 2021-08-24 NOTE — Telephone Encounter (Signed)
Kenneth Green is the medication this patient is referring to

## 2021-08-25 ENCOUNTER — Other Ambulatory Visit (HOSPITAL_COMMUNITY): Payer: Self-pay

## 2021-08-25 ENCOUNTER — Other Ambulatory Visit (INDEPENDENT_AMBULATORY_CARE_PROVIDER_SITE_OTHER): Payer: Self-pay | Admitting: Emergency Medicine

## 2021-08-25 DIAGNOSIS — R7303 Prediabetes: Secondary | ICD-10-CM

## 2021-08-25 MED ORDER — TIRZEPATIDE 2.5 MG/0.5ML ~~LOC~~ SOAJ
2.5000 mg | SUBCUTANEOUS | 0 refills | Status: DC
  Filled 2021-08-25 (×2): qty 2, 28d supply, fill #0

## 2021-08-26 ENCOUNTER — Other Ambulatory Visit (HOSPITAL_COMMUNITY): Payer: Self-pay

## 2021-08-26 ENCOUNTER — Ambulatory Visit (INDEPENDENT_AMBULATORY_CARE_PROVIDER_SITE_OTHER): Payer: 59 | Admitting: Family Medicine

## 2021-08-26 ENCOUNTER — Other Ambulatory Visit: Payer: Self-pay

## 2021-08-26 ENCOUNTER — Encounter (INDEPENDENT_AMBULATORY_CARE_PROVIDER_SITE_OTHER): Payer: Self-pay | Admitting: Family Medicine

## 2021-08-26 VITALS — BP 123/79 | HR 67 | Temp 97.8°F | Ht 69.0 in

## 2021-08-26 DIAGNOSIS — I10 Essential (primary) hypertension: Secondary | ICD-10-CM

## 2021-08-26 DIAGNOSIS — Z9189 Other specified personal risk factors, not elsewhere classified: Secondary | ICD-10-CM

## 2021-08-26 DIAGNOSIS — F3289 Other specified depressive episodes: Secondary | ICD-10-CM

## 2021-08-26 DIAGNOSIS — R7303 Prediabetes: Secondary | ICD-10-CM

## 2021-08-26 DIAGNOSIS — Z6841 Body Mass Index (BMI) 40.0 and over, adult: Secondary | ICD-10-CM

## 2021-08-26 DIAGNOSIS — K5900 Constipation, unspecified: Secondary | ICD-10-CM | POA: Diagnosis not present

## 2021-08-26 MED ORDER — POLYETHYLENE GLYCOL 3350 17 GM/SCOOP PO POWD
17.0000 g | Freq: Two times a day (BID) | ORAL | 0 refills | Status: AC | PRN
Start: 2021-08-26 — End: 2021-09-25
  Filled 2021-08-26: qty 1190, 35d supply, fill #0

## 2021-08-26 MED ORDER — TIRZEPATIDE 5 MG/0.5ML ~~LOC~~ SOAJ
5.0000 mg | SUBCUTANEOUS | 0 refills | Status: DC
  Filled 2021-08-26 – 2021-09-02 (×3): qty 2, 28d supply, fill #0

## 2021-08-26 MED ORDER — BUPROPION HCL ER (SR) 200 MG PO TB12
200.0000 mg | ORAL_TABLET | Freq: Two times a day (BID) | ORAL | 0 refills | Status: DC
  Filled 2021-08-26: qty 60, 30d supply, fill #0

## 2021-08-26 NOTE — Progress Notes (Signed)
Chief Complaint:   OBESITY Kenneth Green is here to discuss his progress with his obesity treatment plan along with follow-up of his obesity related diagnoses. Kenneth Green is on keeping a food journal and adhering to recommended goals of 1800 calories and 100 grams of protein daily and states he is following his eating plan approximately 85% of the time. Kenneth Green states he is walking for 45 minutes 4 times per week.  Today's visit was #: 49 Starting weight: 308 lbs Starting date: 04/10/2019 Today's weight: 271 lbs Today's date: 08/26/2021 Total lbs lost to date: 37 Total lbs lost since last in-office visit: 0  Interim History: Kenneth Green's wife had hip replacement surgery and he has been taking care of her, and he hasn't been able to meal plan as much.  Subjective:   1. Pre-diabetes Kenneth Green is on [place], and he is working on weight loss.  2. Constipation, unspecified constipation type Kenneth Green noted decreased BM especially after starting Mounjaro.  3. Essential hypertension Kenneth Green blood pressure is stable on his medications, and with diet and weight loss.  4. Other depression with emotional eating Kenneth Green 's mood is stable on his medications, and he is due for a refill. His blood pressure is stable and no insomnia was noted.  5. At risk for heart disease Kenneth Green is at a higher than average risk for cardiovascular disease due to obesity.   Assessment/Plan:   1. Pre-diabetes Kenneth Green agreed to increase Mounjaro to 5 mg q weekly with no refills. He will continue to work on weight loss, exercise, and decreasing simple carbohydrates to help decrease the risk of diabetes.   2. Constipation, unspecified constipation type Kenneth Green agreed to start miralax 17 grams q daily and increase his water intake. Orders and follow up as documented in patient record.   3. Essential hypertension Kenneth Green will continue with his diet, medications, and increased GLP-1. He will continue working on healthy  weight loss and exercise to improve blood pressure control. We will watch for signs of hypotension as he continues his lifestyle modifications.  4. Other depression with emotional eating Behavior modification techniques were discussed today to help Kenneth Green deal with his emotional/non-hunger eating behaviors. We will refill Wellbutrin SR 200 mg BID #60 for 1 month. Orders and follow up as documented in patient record.   5. At risk for heart disease Kenneth Green was given approximately 15 minutes of coronary artery disease prevention counseling today. He is 55 y.o. male and has risk factors for heart disease including obesity. We discussed intensive lifestyle modifications today with an emphasis on specific weight loss instructions and strategies.   Repetitive spaced learning was employed today to elicit superior memory formation and behavioral change.  6. Obesity with current BMI 40.0 Kenneth Green is currently in the action stage of change. As such, his goal is to continue with weight loss efforts. He has agreed to keeping a food journal and adhering to recommended goals of 1800 calories and 100+ grams of protein daily.   Exercise goals: As is.  Behavioral modification strategies: increasing lean protein intake, emotional eating strategies, dealing with family or coworker sabotage, and planning for success.  Kenneth Green has agreed to follow-up with our clinic in 4 weeks. He was informed of the importance of frequent follow-up visits to maximize his success with intensive lifestyle modifications for his multiple health conditions.   Objective:   Blood pressure 123/79, pulse 67, temperature 97.8 F (36.6 C), height 5\' 9"  (1.753 m), SpO2 98 %. Body mass index is 40.76  kg/m.  General: Cooperative, alert, well developed, in no acute distress. HEENT: Conjunctivae and lids unremarkable. Cardiovascular: Regular rhythm.  Lungs: Normal work of breathing. Neurologic: No focal deficits.   Lab Results   Component Value Date   CREATININE 1.2 05/11/2021   BUN 16 05/11/2021   NA 138 05/11/2021   K 3.9 05/11/2021   CL 103 05/11/2021   CO2 29 (A) 05/11/2021   Lab Results  Component Value Date   ALT 32 05/11/2021   AST 25 05/11/2021   ALKPHOS 70 01/15/2021   BILITOT 0.5 01/15/2021   Lab Results  Component Value Date   HGBA1C 5.9 (H) 01/15/2021   HGBA1C 5.8 (H) 07/23/2020   HGBA1C 5.6 12/19/2019   HGBA1C 5.7 (H) 09/03/2019   HGBA1C 5.7 05/07/2019   Lab Results  Component Value Date   INSULIN 12.8 01/15/2021   INSULIN 18.1 07/23/2020   INSULIN 27.0 (H) 12/19/2019   INSULIN 24.2 09/03/2019   INSULIN 30.9 (H) 04/10/2019   Lab Results  Component Value Date   TSH 1.340 04/10/2019   Lab Results  Component Value Date   CHOL 148 05/11/2021   HDL 46 01/15/2021   LDLCALC 84 05/11/2021   TRIG 109 05/11/2021   Lab Results  Component Value Date   VD25OH 35.0 05/11/2021   VD25OH 34.3 01/15/2021   VD25OH 51.2 09/03/2020   Lab Results  Component Value Date   WBC 6.0 04/10/2019   HGB 13.4 04/10/2019   HCT 39.2 04/10/2019   MCV 82 04/10/2019   PLT 235 06/15/2016   No results found for: IRON, TIBC, FERRITIN  Attestation Statements:   Reviewed by clinician on day of visit: allergies, medications, problem list, medical history, surgical history, family history, social history, and previous encounter notes.   I, Trixie Dredge, am acting as transcriptionist for Dennard Nip, MD.  I have reviewed the above documentation for accuracy and completeness, and I agree with the above. -  Dennard Nip, MD

## 2021-08-31 ENCOUNTER — Other Ambulatory Visit (HOSPITAL_COMMUNITY): Payer: Self-pay

## 2021-08-31 ENCOUNTER — Other Ambulatory Visit: Payer: Self-pay | Admitting: Behavioral Health

## 2021-08-31 DIAGNOSIS — F331 Major depressive disorder, recurrent, moderate: Secondary | ICD-10-CM

## 2021-08-31 DIAGNOSIS — F411 Generalized anxiety disorder: Secondary | ICD-10-CM

## 2021-09-02 ENCOUNTER — Ambulatory Visit (INDEPENDENT_AMBULATORY_CARE_PROVIDER_SITE_OTHER): Payer: 59 | Admitting: Family Medicine

## 2021-09-02 ENCOUNTER — Other Ambulatory Visit (HOSPITAL_COMMUNITY): Payer: Self-pay

## 2021-09-02 ENCOUNTER — Ambulatory Visit (INDEPENDENT_AMBULATORY_CARE_PROVIDER_SITE_OTHER): Payer: 59 | Admitting: Psychology

## 2021-09-02 DIAGNOSIS — F4321 Adjustment disorder with depressed mood: Secondary | ICD-10-CM

## 2021-09-03 ENCOUNTER — Encounter (INDEPENDENT_AMBULATORY_CARE_PROVIDER_SITE_OTHER): Payer: Self-pay

## 2021-09-03 ENCOUNTER — Other Ambulatory Visit (HOSPITAL_COMMUNITY): Payer: Self-pay

## 2021-09-07 ENCOUNTER — Other Ambulatory Visit (HOSPITAL_COMMUNITY): Payer: Self-pay

## 2021-09-08 ENCOUNTER — Ambulatory Visit: Payer: 59 | Admitting: Behavioral Health

## 2021-09-10 ENCOUNTER — Other Ambulatory Visit (HOSPITAL_COMMUNITY): Payer: Self-pay

## 2021-09-14 ENCOUNTER — Encounter: Payer: Self-pay | Admitting: Behavioral Health

## 2021-09-14 ENCOUNTER — Other Ambulatory Visit: Payer: Self-pay

## 2021-09-14 ENCOUNTER — Ambulatory Visit (INDEPENDENT_AMBULATORY_CARE_PROVIDER_SITE_OTHER): Payer: 59 | Admitting: Behavioral Health

## 2021-09-14 DIAGNOSIS — Z638 Other specified problems related to primary support group: Secondary | ICD-10-CM

## 2021-09-14 DIAGNOSIS — F331 Major depressive disorder, recurrent, moderate: Secondary | ICD-10-CM

## 2021-09-14 DIAGNOSIS — F411 Generalized anxiety disorder: Secondary | ICD-10-CM

## 2021-09-14 MED ORDER — SERTRALINE HCL 50 MG PO TABS: 75.0000 mg | ORAL_TABLET | Freq: Every day | ORAL | 3 refills | Status: DC

## 2021-09-14 NOTE — Progress Notes (Signed)
[facility] Check  [name],  MRN: [id number]  PCP: Cari Caraway, MD  Date of Evaluation: 09/14/2021 Time spent:20 minutes  Chief Complaint:  Chief Complaint   Depression; Anxiety; Family Problem; Follow-up; Medication Refill     HISTORY/CURRENT STATUS: HPI  55 year old male presents to this office for follow up and medication management. He is very pleased with how the Zoloft is working so far. He says that he feels much calmer and things do not seem to bother him as much. He says he still has some A&D remaining and would like to consider a small increase with the Zoloft. He recently took two weeks of vacation which he says helped him tremendously. Wife is schedule for hip replacement surgery soon. He reports his anxiety today at 5/10  and depression at 4/10. Says he is sleeping 7-8 hours nightly. Denies mania, no psychosis. No SI/HI.   No Past medication failures    Individual Medical History/ Review of Systems: Changes? :No   Allergies: Iohexol  Current Medications:  Current Outpatient Medications:    sertraline (ZOLOFT) 50 MG tablet, Take 1.5 tablets (75 mg total) by mouth daily., Disp: 45 tablet, Rfl: 3   benazepril-hydrochlorthiazide (LOTENSIN HCT) 10-12.5 MG tablet, Take 1 tablet by mouth 2 (two) times daily., Disp: 60 tablet, Rfl: 0   buPROPion (WELLBUTRIN SR) 200 MG 12 hr tablet, Take 1 tablet (200 mg total) by mouth 2 (two) times daily., Disp: 60 tablet, Rfl: 0   fexofenadine (ALLEGRA) 180 MG tablet, Take 180 mg by mouth daily., Disp: , Rfl:    polyethylene glycol powder (GLYCOLAX/MIRALAX) 17 GM/SCOOP powder, Take 17 g by mouth 2 (two) times daily as needed., Disp: 1020 g, Rfl: 0   pregabalin (LYRICA) 150 MG capsule, Take 150 mg by mouth 2 (two) times daily., Disp: , Rfl:    tirzepatide (MOUNJARO) 5 MG/0.5ML Pen, Inject 1 pen (5 mg) into the skin once a week., Disp: 2 mL, Rfl: 0   Travoprost, BAK Free, (TRAVATAN) 0.004 % SOLN ophthalmic  solution, Place 1 drop into both eyes at bedtime., Disp: , Rfl:  Medication Side Effects: none  Family Medical/ Social History: Changes? No  MENTAL HEALTH EXAM:  There were no vitals taken for this visit.There is no height or weight on file to calculate BMI.  General Appearance: Casual and Neat  Eye Contact:  Good  Speech:  Clear and Coherent  Volume:  Normal  Mood:  Anxious and Depressed  Affect:  Appropriate and Non-Congruent  Thought Process:  Coherent  Orientation:  Full (Time, Place, and Person)  Thought Content: Logical   Suicidal Thoughts:  No  Homicidal Thoughts:  No  Memory:  WNL  Judgement:  Good  Insight:  Good  Psychomotor Activity:  Normal  Concentration:  Concentration: Good  Recall:  Good  Fund of Knowledge: Good  Language: Good  Assets:  Desire for Improvement  ADL's:  Intact  Cognition: WNL  Prognosis:  Good    DIAGNOSES:    ICD-10-CM   1. Caregiver role strain  Z63.8     2. Generalized anxiety disorder  F41.1 sertraline (ZOLOFT) 50 MG tablet    3. Major depressive disorder, recurrent episode, moderate (HCC)  F33.1 sertraline (ZOLOFT) 50 MG tablet      Receiving Psychotherapy:yes, Dennison Bulla   RECOMMENDATIONS:   Greater than 50% of  60 min face to face time with patient was spent on counseling and coordination of care. We discussed his significant improvement with  depression and anxiety. 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 He did finally take a two week vacation in which he said he enjoyed and felt much better. His spouse has upcoming hip replacement surgery soon.  He will continue psychotherapy with Dennison Bulla. We discussed the following plan: To increase Zoloft to 75 mg daily Continue Wellbutrin 200 mg SR two times daily Will plan to reduce Wellbutrin next visit.  To report side effects or worsening symptoms promptly Provided  emergency contact information To follow up in 2 months to reassess. Reviewed PDMP        Elwanda Brooklyn, NP

## 2021-09-16 ENCOUNTER — Other Ambulatory Visit (HOSPITAL_COMMUNITY): Payer: Self-pay

## 2021-09-23 ENCOUNTER — Other Ambulatory Visit: Payer: Self-pay

## 2021-09-23 ENCOUNTER — Ambulatory Visit (INDEPENDENT_AMBULATORY_CARE_PROVIDER_SITE_OTHER): Payer: 59 | Admitting: Family Medicine

## 2021-09-23 ENCOUNTER — Encounter (INDEPENDENT_AMBULATORY_CARE_PROVIDER_SITE_OTHER): Payer: Self-pay | Admitting: Family Medicine

## 2021-09-23 VITALS — BP 136/81 | HR 73 | Temp 98.0°F | Ht 69.0 in | Wt 271.0 lb

## 2021-09-23 DIAGNOSIS — Z6841 Body Mass Index (BMI) 40.0 and over, adult: Secondary | ICD-10-CM | POA: Diagnosis not present

## 2021-09-23 DIAGNOSIS — R7303 Prediabetes: Secondary | ICD-10-CM | POA: Diagnosis not present

## 2021-09-23 MED ORDER — TIRZEPATIDE 5 MG/0.5ML ~~LOC~~ SOAJ: 5.0000 mg | SUBCUTANEOUS | 0 refills | Status: DC

## 2021-09-23 NOTE — Progress Notes (Signed)
Chief Complaint:   OBESITY Kenneth Green is here to discuss his progress with his obesity treatment plan along with follow-up of his obesity related diagnoses. Kenneth Green is on keeping a food journal and adhering to recommended goals of 1800 calories and 100+ grams of protein daily and states he is following his eating plan approximately 75% of the time. Kenneth Green states he is walking for 45 minutes 3-4 times per week.  Today's visit was #: 55 Starting weight: 308 lbs Starting date: 04/10/2019 Today's weight: 271 lbs Today's date: 09/23/2021 Total lbs lost to date: 37 Total lbs lost since last in-office visit: 0  Interim History: Kenneth Green has done well avoiding weight gain over Halloween. He is considering doing a work weight loss plan, which has a lot of accountability, but it is higher fat than is recommended for him.  Subjective:   1. Pre-diabetes Kenneth Green is stable on Mounjaro, and he increased to 0.5 mg and is starting to feel a decrease in polyphagia. He notes increased nausea if he eats too quickly or if his portions are too large.  Assessment/Plan:   1. Pre-diabetes Kenneth Green will continue to work on weight loss, exercise, and decreasing simple carbohydrates to help decrease the risk of diabetes. We will refill Mounjaro for 1 month.  - tirzepatide (MOUNJARO) 5 MG/0.5ML Pen; Inject 1 pen (5 mg) into the skin once a week.  Dispense: 2 mL; Refill: 0  2. Obesity BMI today 40 Kenneth Green is currently in the action stage of change. As such, his goal is to continue with weight loss efforts. He has agreed to keeping a food journal and adhering to recommended goals of 1800 calories and 120+ grams of protein daily.   Kenneth Green is ok to do a modified version of his plan that is lower in fat and no added sodium.  Exercise goals: As is.  Behavioral modification strategies: holiday eating strategies .  Kenneth Green has agreed to follow-up with our clinic in 4 weeks. He was informed of the importance of  frequent follow-up visits to maximize his success with intensive lifestyle modifications for his multiple health conditions.   Objective:   Blood pressure 136/81, pulse 73, temperature 98 F (36.7 C), temperature source Oral, height 5\' 9"  (1.753 m), weight 271 lb (122.9 kg), SpO2 (!) 70 %. Body mass index is 40.02 kg/m.  General: Cooperative, alert, well developed, in no acute distress. HEENT: Conjunctivae and lids unremarkable. Cardiovascular: Regular rhythm.  Lungs: Normal work of breathing. Neurologic: No focal deficits.   Lab Results  Component Value Date   CREATININE 1.2 05/11/2021   BUN 16 05/11/2021   NA 138 05/11/2021   K 3.9 05/11/2021   CL 103 05/11/2021   CO2 29 (A) 05/11/2021   Lab Results  Component Value Date   ALT 32 05/11/2021   AST 25 05/11/2021   ALKPHOS 70 01/15/2021   BILITOT 0.5 01/15/2021   Lab Results  Component Value Date   HGBA1C 5.9 (H) 01/15/2021   HGBA1C 5.8 (H) 07/23/2020   HGBA1C 5.6 12/19/2019   HGBA1C 5.7 (H) 09/03/2019   HGBA1C 5.7 05/07/2019   Lab Results  Component Value Date   INSULIN 12.8 01/15/2021   INSULIN 18.1 07/23/2020   INSULIN 27.0 (H) 12/19/2019   INSULIN 24.2 09/03/2019   INSULIN 30.9 (H) 04/10/2019   Lab Results  Component Value Date   TSH 1.340 04/10/2019   Lab Results  Component Value Date   CHOL 148 05/11/2021   HDL 46 01/15/2021  LDLCALC 84 05/11/2021   TRIG 109 05/11/2021   Lab Results  Component Value Date   VD25OH 35.0 05/11/2021   VD25OH 34.3 01/15/2021   VD25OH 51.2 09/03/2020   Lab Results  Component Value Date   WBC 6.0 04/10/2019   HGB 13.4 04/10/2019   HCT 39.2 04/10/2019   MCV 82 04/10/2019   PLT 235 06/15/2016   No results found for: IRON, TIBC, FERRITIN  Attestation Statements:   Reviewed by clinician on day of visit: allergies, medications, problem list, medical history, surgical history, family history, social history, and previous encounter notes.  Time spent on visit  including pre-visit chart review and post-visit care and charting was 35 minutes.    I, Trixie Dredge, am acting as transcriptionist for Dennard Nip, MD.  I have reviewed the above documentation for accuracy and completeness, and I agree with the above. -  Dennard Nip, MD

## 2021-09-29 ENCOUNTER — Ambulatory Visit: Payer: 59 | Admitting: Psychology

## 2021-10-04 ENCOUNTER — Other Ambulatory Visit: Payer: Self-pay | Admitting: Behavioral Health

## 2021-10-04 DIAGNOSIS — F411 Generalized anxiety disorder: Secondary | ICD-10-CM

## 2021-10-04 DIAGNOSIS — F331 Major depressive disorder, recurrent, moderate: Secondary | ICD-10-CM

## 2021-10-12 ENCOUNTER — Other Ambulatory Visit (INDEPENDENT_AMBULATORY_CARE_PROVIDER_SITE_OTHER): Payer: Self-pay | Admitting: Family Medicine

## 2021-10-12 ENCOUNTER — Telehealth (INDEPENDENT_AMBULATORY_CARE_PROVIDER_SITE_OTHER): Payer: Self-pay

## 2021-10-12 DIAGNOSIS — R7303 Prediabetes: Secondary | ICD-10-CM

## 2021-10-12 NOTE — Telephone Encounter (Signed)
Patient request refill on RX Mounjaro. Patient having issues with my chart. Any questions please call [phone]

## 2021-10-12 NOTE — Telephone Encounter (Signed)
Pt last seen by Dr. Beasley.  

## 2021-10-13 ENCOUNTER — Other Ambulatory Visit (HOSPITAL_COMMUNITY): Payer: Self-pay

## 2021-10-13 ENCOUNTER — Other Ambulatory Visit (INDEPENDENT_AMBULATORY_CARE_PROVIDER_SITE_OTHER): Payer: Self-pay | Admitting: Family Medicine

## 2021-10-13 ENCOUNTER — Ambulatory Visit (INDEPENDENT_AMBULATORY_CARE_PROVIDER_SITE_OTHER): Payer: 59 | Admitting: Psychology

## 2021-10-13 DIAGNOSIS — R7303 Prediabetes: Secondary | ICD-10-CM

## 2021-10-13 DIAGNOSIS — F4321 Adjustment disorder with depressed mood: Secondary | ICD-10-CM | POA: Diagnosis not present

## 2021-10-13 NOTE — Telephone Encounter (Signed)
What medicine does he need?

## 2021-10-14 ENCOUNTER — Other Ambulatory Visit (HOSPITAL_COMMUNITY): Payer: Self-pay

## 2021-10-14 MED ORDER — TIRZEPATIDE 5 MG/0.5ML ~~LOC~~ SOAJ
5.0000 mg | SUBCUTANEOUS | 0 refills | Status: DC
  Filled 2021-10-14: qty 2, 28d supply, fill #0

## 2021-10-14 NOTE — Addendum Note (Signed)
Addended by: Neomia Dear on: 10/14/2021 10:28 AM   Modules accepted: Orders

## 2021-10-21 ENCOUNTER — Encounter (INDEPENDENT_AMBULATORY_CARE_PROVIDER_SITE_OTHER): Payer: Self-pay | Admitting: Family Medicine

## 2021-10-21 ENCOUNTER — Other Ambulatory Visit (HOSPITAL_COMMUNITY): Payer: Self-pay

## 2021-10-21 ENCOUNTER — Telehealth (INDEPENDENT_AMBULATORY_CARE_PROVIDER_SITE_OTHER): Payer: 59 | Admitting: Family Medicine

## 2021-10-21 VITALS — BP 122/74 | HR 79 | Temp 98.8°F

## 2021-10-21 DIAGNOSIS — Z6841 Body Mass Index (BMI) 40.0 and over, adult: Secondary | ICD-10-CM

## 2021-10-21 DIAGNOSIS — R7303 Prediabetes: Secondary | ICD-10-CM | POA: Diagnosis not present

## 2021-10-21 MED ORDER — TIRZEPATIDE 5 MG/0.5ML ~~LOC~~ SOAJ
5.0000 mg | SUBCUTANEOUS | 0 refills | Status: DC
  Filled 2021-10-21: qty 2, 28d supply, fill #0

## 2021-10-22 NOTE — Progress Notes (Signed)
TeleHealth Visit:  Due to the COVID-19 pandemic, this visit was completed with telemedicine (audio/video) technology to reduce patient and provider exposure as well as to preserve personal protective equipment.   Kenneth Green has verbally consented to this TeleHealth visit. The patient is located at home, the provider is located at the [facility] office. The participants in this visit include the listed provider and patient. The visit was conducted today via MyChart video.   Chief Complaint: OBESITY Kenneth Green is here to discuss his progress with his obesity treatment plan along with follow-up of his obesity related diagnoses. Kenneth Green is on keeping a food journal and adhering to recommended goals of 1800 calories and 120+ grams of protein daily and states he is following his eating plan approximately 98% of the time. Kenneth Green states he was doing some walking.   Today's visit was #: 40 Starting weight: 308 lbs Starting date: 04/10/2019  Interim History: Kenneth Green is sick with COVID and he was changed to a virtual visit. He has been doing more journaling along with his work weight loss program. He notes his hunger is controlled.  Subjective:   1. Pre-diabetes Kenneth Green is doing well on Mounjaro, and he notes decreased polyphagia. He has been working on eating slower. He denies nausea or vomiting.  Assessment/Plan:   1. Pre-diabetes Kenneth Green will continue to work on weight loss, exercise, and decreasing simple carbohydrates to help decrease the risk of diabetes. We will refill Mounjaro for 1 month.  - tirzepatide (MOUNJARO) 5 MG/0.5ML Pen; Inject 1 pen (5 mg) into the skin once a week.  Dispense: 2 mL; Refill: 0  2. Class 3 severe obesity with serious comorbidity and body mass index (BMI) of 45.0 to 49.9 in adult, unspecified obesity type Kenneth Green) Kenneth Green is currently in the action stage of change. As such, his goal is to continue with weight loss efforts. He has agreed to keeping a  food journal and adhering to recommended goals of 1800 calories and 100+ grams of protein daily.   Behavioral modification strategies: increasing water intake and meal planning and cooking strategies.  Kenneth Green has agreed to follow-up with our clinic in 3 weeks. He was informed of the importance of frequent follow-up visits to maximize his success with intensive lifestyle modifications for his multiple health conditions.  Objective:   VITALS: Per patient if applicable, see vitals. GENERAL: Alert and in no acute distress. CARDIOPULMONARY: No increased WOB. Speaking in clear sentences.  PSYCH: Pleasant and cooperative. Speech normal rate and rhythm. Affect is appropriate. Insight and judgement are appropriate. Attention is focused, linear, and appropriate.  NEURO: Oriented as arrived to appointment on time with no prompting.   Lab Results  Component Value Date   CREATININE 1.2 05/11/2021   BUN 16 05/11/2021   NA 138 05/11/2021   K 3.9 05/11/2021   CL 103 05/11/2021   CO2 29 (A) 05/11/2021   Lab Results  Component Value Date   ALT 32 05/11/2021   AST 25 05/11/2021   ALKPHOS 70 01/15/2021   BILITOT 0.5 01/15/2021   Lab Results  Component Value Date   HGBA1C 5.9 (H) 01/15/2021   HGBA1C 5.8 (H) 07/23/2020   HGBA1C 5.6 12/19/2019   HGBA1C 5.7 (H) 09/03/2019   HGBA1C 5.7 05/07/2019   Lab Results  Component Value Date   INSULIN 12.8 01/15/2021   INSULIN 18.1 07/23/2020   INSULIN 27.0 (H) 12/19/2019   INSULIN 24.2 09/03/2019   INSULIN 30.9 (H) 04/10/2019   Lab Results  Component Value Date   TSH 1.340 04/10/2019   Lab Results  Component Value Date   CHOL 148 05/11/2021   HDL 46 01/15/2021   LDLCALC 84 05/11/2021   TRIG 109 05/11/2021   Lab Results  Component Value Date   VD25OH 35.0 05/11/2021   VD25OH 34.3 01/15/2021   VD25OH 51.2 09/03/2020   Lab Results  Component Value Date   WBC 6.0 04/10/2019   HGB 13.4 04/10/2019   HCT 39.2 04/10/2019   MCV 82  04/10/2019   PLT 235 06/15/2016   No results found for: IRON, TIBC, FERRITIN  Attestation Statements:   Reviewed by clinician on day of visit: allergies, medications, problem list, medical history, surgical history, family history, social history, and previous encounter notes.   I, Trixie Dredge, am acting as transcriptionist for Dennard Nip, MD.  I have reviewed the above documentation for accuracy and completeness, and I agree with the above. - Dennard Nip, MD

## 2021-10-27 ENCOUNTER — Other Ambulatory Visit: Payer: Self-pay | Admitting: Behavioral Health

## 2021-10-27 DIAGNOSIS — F331 Major depressive disorder, recurrent, moderate: Secondary | ICD-10-CM

## 2021-10-27 DIAGNOSIS — F411 Generalized anxiety disorder: Secondary | ICD-10-CM

## 2021-11-09 ENCOUNTER — Encounter (INDEPENDENT_AMBULATORY_CARE_PROVIDER_SITE_OTHER): Payer: Self-pay

## 2021-11-11 ENCOUNTER — Encounter (INDEPENDENT_AMBULATORY_CARE_PROVIDER_SITE_OTHER): Payer: Self-pay | Admitting: Family Medicine

## 2021-11-11 ENCOUNTER — Other Ambulatory Visit (INDEPENDENT_AMBULATORY_CARE_PROVIDER_SITE_OTHER): Payer: Self-pay | Admitting: Family Medicine

## 2021-11-11 ENCOUNTER — Other Ambulatory Visit: Payer: Self-pay

## 2021-11-11 ENCOUNTER — Other Ambulatory Visit (HOSPITAL_COMMUNITY): Payer: Self-pay

## 2021-11-11 ENCOUNTER — Ambulatory Visit (INDEPENDENT_AMBULATORY_CARE_PROVIDER_SITE_OTHER): Payer: 59 | Admitting: Family Medicine

## 2021-11-11 VITALS — BP 131/82 | HR 61 | Temp 97.7°F | Ht 69.0 in | Wt 247.0 lb

## 2021-11-11 DIAGNOSIS — E7849 Other hyperlipidemia: Secondary | ICD-10-CM | POA: Diagnosis not present

## 2021-11-11 DIAGNOSIS — R7303 Prediabetes: Secondary | ICD-10-CM

## 2021-11-11 DIAGNOSIS — Z6841 Body Mass Index (BMI) 40.0 and over, adult: Secondary | ICD-10-CM

## 2021-11-11 DIAGNOSIS — Z9189 Other specified personal risk factors, not elsewhere classified: Secondary | ICD-10-CM | POA: Diagnosis not present

## 2021-11-11 DIAGNOSIS — E559 Vitamin D deficiency, unspecified: Secondary | ICD-10-CM | POA: Diagnosis not present

## 2021-11-11 MED ORDER — TIRZEPATIDE 5 MG/0.5ML ~~LOC~~ SOAJ
5.0000 mg | SUBCUTANEOUS | 0 refills | Status: DC
  Filled 2021-11-11 – 2021-11-18 (×2): qty 2, 28d supply, fill #0

## 2021-11-11 NOTE — Progress Notes (Signed)
° ° ° °Chief Complaint:  ° °OBESITY °Kenneth Green is here to discuss his progress with his obesity treatment plan along with follow-up of his obesity related diagnoses. Kenneth Green is on keeping a food journal and adhering to recommended goals of 1800 calories and 100+ grams of protein daily and states he is following his eating plan approximately 95% of the time. Kenneth Green states he is walking for 45-60 minutes 5 times per week. ° °Today's visit was #: 41 °Starting weight: 308 lbs °Starting date: 04/10/2019 °Today's weight: 247 lbs °Today's date: 11/11/2021 °Total lbs lost to date: 61 °Total lbs lost since last in-office visit: 24 ° °Interim History: Kenneth Green has done very well with weight loss and he has actually lost weight faster than ideal. His hunger is controlled and his portions are much smaller. ° °Subjective:  ° °1. Pre-diabetes °Kenneth Green is stable on Mounjaro, and he notes decreased polyphagia. He denies nausea or vomiting. ° °2. Other hyperlipidemia °Kenneth Green is working on diet and exercise, and he is due for labs. ° °3. Vitamin D deficiency °Kenneth Green is stable on Vit D, and he denies nausea, vomiting, or muscle weakness. ° °4. At risk for impaired metabolic function °Kenneth Green is at increased risk for impaired metabolic function due to current nutrition and muscle mass. ° °Assessment/Plan:  ° °1. Pre-diabetes °Kenneth Green will continue to work on weight loss, exercise, and decreasing simple carbohydrates to help decrease the risk of diabetes. We will check labs today, and we will refill Mounjaro for 1 month. ° °- tirzepatide (MOUNJARO) 5 MG/0.5ML Pen; Inject 1 pen (5 mg) into the skin once a week.  Dispense: 2 mL; Refill: 0 °- [id number]+EGFR °- Insulin, random °- Hemoglobin A1c ° °2. Other hyperlipidemia °Cardiovascular risk and specific lipid/LDL goals reviewed. We discussed several lifestyle modifications today. We will check labs today. Kenneth Green will continue to work on diet, exercise and weight loss efforts. Orders and  follow up as documented in patient record.  ° °- Lipid Panel With LDL/HDL Ratio ° °3. Vitamin D deficiency °Low Vitamin D level contributes to fatigue and are associated with obesity, breast, and colon cancer. We will refill check labs today. Kenneth Green will follow-up for routine testing of Vitamin D, at least 2-3 times per year to avoid over-replacement. ° °- VITAMIN D 25 Hydroxy (Vit-D Deficiency, Fractures) ° °4. At risk for impaired metabolic function °Kenneth Green was given approximately 15 minutes of impaired metabolic function prevention counseling today. We discussed intensive lifestyle modifications today with an emphasis on specific nutrition and exercise instructions and strategies.  ° °Repetitive spaced learning was employed today to elicit superior memory formation and behavioral change.  ° °5. Obesity BMI today is 36 °Kenneth Green is currently in the action stage of change. As such, his goal is to continue with weight loss efforts. He has agreed to keeping a food journal and adhering to recommended goals of 1500-1800 calories and 100+ grams of protein daily.  ° °Exercise goals: As is. ° °Behavioral modification strategies: meal planning and cooking strategies and holiday eating strategies . ° °Kenneth Green has agreed to follow-up with our clinic in 4 weeks. He was informed of the importance of frequent follow-up visits to maximize his success with intensive lifestyle modifications for his multiple health conditions.  ° °Kenneth Green was informed we would discuss his lab results at his next visit unless there is a critical issue that needs to be addressed sooner. Kenneth Green agreed to keep his next visit at the agreed upon time to discuss these results. ° °Objective:  ° °  Blood pressure 131/82, pulse 61, temperature 97.7 °F (36.5 °C), height 5' 9" (1.753 m), weight 247 lb (112 kg), SpO2 99 %. °Body mass index is 36.48 kg/m². ° °General: Cooperative, alert, well developed, in no acute distress. °HEENT: Conjunctivae and lids  unremarkable. °Cardiovascular: Regular rhythm.  °Lungs: Normal work of breathing. °Neurologic: No focal deficits.  ° °Lab Results  °Component Value Date  ° CREATININE 1.2 05/11/2021  ° BUN 16 05/11/2021  ° NA 138 05/11/2021  ° K 3.9 05/11/2021  ° CL 103 05/11/2021  ° CO2 29 (A) 05/11/2021  ° °Lab Results  °Component Value Date  ° ALT 32 05/11/2021  ° AST 25 05/11/2021  ° ALKPHOS 70 01/15/2021  ° BILITOT 0.5 01/15/2021  ° °Lab Results  °Component Value Date  ° HGBA1C 5.9 (H) 01/15/2021  ° HGBA1C 5.8 (H) 07/23/2020  ° HGBA1C 5.6 12/19/2019  ° HGBA1C 5.7 (H) 09/03/2019  ° HGBA1C 5.7 05/07/2019  ° °Lab Results  °Component Value Date  ° INSULIN 12.8 01/15/2021  ° INSULIN 18.1 07/23/2020  ° INSULIN 27.0 (H) 12/19/2019  ° INSULIN 24.2 09/03/2019  ° INSULIN 30.9 (H) 04/10/2019  ° °Lab Results  °Component Value Date  ° TSH 1.340 04/10/2019  ° °Lab Results  °Component Value Date  ° CHOL 148 05/11/2021  ° HDL 46 01/15/2021  ° LDLCALC 84 05/11/2021  ° TRIG 109 05/11/2021  ° °Lab Results  °Component Value Date  ° VD25OH 35.0 05/11/2021  ° VD25OH 34.3 01/15/2021  ° VD25OH 51.2 09/03/2020  ° °Lab Results  °Component Value Date  ° WBC 6.0 04/10/2019  ° HGB 13.4 04/10/2019  ° HCT 39.2 04/10/2019  ° MCV 82 04/10/2019  ° PLT 235 06/15/2016  ° °No results found for: IRON, TIBC, FERRITIN ° °Attestation Statements:  ° °Reviewed by clinician on day of visit: allergies, medications, problem list, medical history, surgical history, family history, social history, and previous encounter notes. ° ° °I, Sharon Martin, am acting as transcriptionist for Caren Beasley, MD. ° °I have reviewed the above documentation for accuracy and completeness, and I agree with the above. -  Caren Beasley, MD ° ° °

## 2021-11-12 LAB — CMP14+EGFR
ALT: 37 IU/L (ref 0–44)
AST: 35 IU/L (ref 0–40)
Albumin/Globulin Ratio: 2.1 (ref 1.2–2.2)
Albumin: 4.7 g/dL (ref 3.8–4.9)
Alkaline Phosphatase: 78 IU/L (ref 44–121)
BUN/Creatinine Ratio: 17 (ref 9–20)
BUN: 17 mg/dL (ref 6–24)
Bilirubin Total: 0.5 mg/dL (ref 0.0–1.2)
CO2: 21 mmol/L (ref 20–29)
Calcium: 9.4 mg/dL (ref 8.7–10.2)
Chloride: 102 mmol/L (ref 96–106)
Creatinine, Ser: 1.02 mg/dL (ref 0.76–1.27)
Globulin, Total: 2.2 g/dL (ref 1.5–4.5)
Glucose: 82 mg/dL (ref 70–99)
Potassium: 3.7 mmol/L (ref 3.5–5.2)
Sodium: 139 mmol/L (ref 134–144)
Total Protein: 6.9 g/dL (ref 6.0–8.5)
eGFR: 86 mL/min/{1.73_m2} (ref >59)

## 2021-11-12 LAB — HEMOGLOBIN A1C
Est. average glucose Bld gHb Est-mCnc: 108 mg/dL
Hgb A1c MFr Bld: 5.4 % (ref 4.8–5.6)

## 2021-11-12 LAB — LIPID PANEL WITH LDL/HDL RATIO
Cholesterol, Total: 182 mg/dL (ref 100–199)
HDL: 51 mg/dL (ref >39)
LDL Chol Calc (NIH): 111 mg/dL — ABNORMAL HIGH (ref 0–99)
LDL/HDL Ratio: 2.2 ratio (ref 0.0–3.6)
Triglycerides: 111 mg/dL (ref 0–149)
VLDL Cholesterol Cal: 20 mg/dL (ref 5–40)

## 2021-11-12 LAB — VITAMIN D 25 HYDROXY (VIT D DEFICIENCY, FRACTURES): Vit D, 25-Hydroxy: 34.5 ng/mL (ref 30.0–100.0)

## 2021-11-12 LAB — INSULIN, RANDOM: INSULIN: 20.5 u[IU]/mL (ref 2.6–24.9)

## 2021-11-17 ENCOUNTER — Ambulatory Visit (INDEPENDENT_AMBULATORY_CARE_PROVIDER_SITE_OTHER): Payer: 59 | Admitting: Psychology

## 2021-11-17 DIAGNOSIS — F4323 Adjustment disorder with mixed anxiety and depressed mood: Secondary | ICD-10-CM | POA: Diagnosis not present

## 2021-11-17 NOTE — Progress Notes (Signed)
[facility] Counselor/Therapist Progress Note  [name], MRN: [id number],    Date: 11/17/2021  Time Spent: 50 mins  Treatment Type: Individual Therapy  Reported Symptoms: Pt presented via webex video for follow up session, granting consent for the session.  Pt stated he was in his home and no one else was present.  I shared with pt that I am in my office and no one else is present.  Pt shares their Christmas "was very low key but it was nice."  Mental Status Exam: Appearance:  Casual     Behavior: Appropriate  Motor: Normal  Speech/Language:  Clear and Coherent  Affect: Appropriate  Mood: normal  Thought process: normal  Thought content:   WNL  Sensory/Perceptual disturbances:   WNL  Orientation: oriented to person, place, and time/date  Attention: Good  Concentration: Good  Memory: WNL  Fund of knowledge:  Good  Insight:   Good  Judgment:  Good  Impulse Control: Good   Risk Assessment: Danger to Self:  No Self-injurious Behavior: No Danger to Others: No Duty to Warn:no Physical Aggression / Violence:No  Access to Firearms a concern: No  Gang Involvement:No   Subjective: Pt shares that he has been "getting used to my new job responsibilities and it has been cold.  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  We are hoping it will pick up soon."  Pt is working 5p to 5a, M-W, and F.  Pt shares that his sleep has been pretty good since our last session.  Pt shares that his back "is doing a lot better.  I am having less pain in my leg and it seems to be better all around."  Pt shares he continues to lose weight with this new program through work; he had lost 26.5 pounds before the holiday; he started in Nov and is really pleased with the results as of this time.  Pt shares that his daughter who is a Pharmacist, hospital is enjoying her break from school.  The daughter who is a Psychologist, clinical had to work some on Christmas but they got to see her  and the baby on Christmas day as well.  Pt shares that Kenneth Green continues to heal well from her second knee replacement (6 wks ago).  Pt shares that he "is feeling OK; just kind of floating along right now."  Pt continues to have campfires with his daughter and her boyfriend most evenings; he also played [place] at a corporate event for [employer] and he enjoyed it; he also used the suit to do [place] for his grand daughter.  He has also been playing with the church band during the holidays as well.  Pt shares he is drinking less than he was before.  He knows it is helping with his weight loss as well.  Pt shares that his mood continues to be good; he is taking his medications daily and has a follow up with his doctor tomorrow.  Pt shares that Kenneth Green has been back to a pattern of complaining; "I am afraid we are never going to get her out of the bedroom; I am afraid that the rest of my life will be spent care taking; if that is how it is, that will be OK."  Encouraged pt to encourage [name] to engage in as much crafting as she can do, since she still has an interest in that.  Also encouraged pt to try to come up  with one or two additional self care activities for the next couple of months until he can get back outside to start his Spring clean up.  We will meet in 4 wks for a follow up session.   Interventions: Cognitive Behavioral Therapy  Diagnosis:Adjustment disorder with mixed anxiety and depressed mood  Plan: Treatment Plan Strengths/Abilities:  Intelligent, Intuitive, Willing to participate in therapy Treatment Preferences:  Outpatient Individual Therapy Statement of Needs:  Patient is to use CBT, mindfulness and coping skills to help manage and/or decrease symptoms associated with their diagnosis. Symptoms:  Depressed/Irritable mood, worry, social withdrawal Problems Addressed:  Depressive thoughts, Sadness, Sleep issues, etc. Long Term Goals:  Pt to reduce overall level, frequency, and intensity of the  feelings of depression as evidenced by decreased irritability, negative self talk, and helpless feelings from 6 to 7 days/week to 0 to 1 days/week, per client report, for at least 3 consecutive months.  Progress: 20% Short Term Goals:  Pt to verbally express understanding of the relationship between feelings of depression and their impact on thinking patterns and behaviors.  Pt to verbalize an understanding of the role that distorted thinking plays in creating fears, excessive worry, and ruminations.  Progress: 20% Target Date:  11/17/2022 Frequency:  Bi-weekly Modality:  Cognitive Behavioral Therapy Interventions by Therapist:  Therapist will use CBT, Mindfulness exercises, Coping skills and Referrals, as needed by client. Client has verbally approved this treatment plan.  Ivan Anchors, [facility]

## 2021-11-18 ENCOUNTER — Ambulatory Visit: Payer: 59 | Admitting: Behavioral Health

## 2021-11-18 ENCOUNTER — Other Ambulatory Visit (HOSPITAL_COMMUNITY): Payer: Self-pay

## 2021-11-24 ENCOUNTER — Encounter: Payer: Self-pay | Admitting: Behavioral Health

## 2021-11-24 ENCOUNTER — Ambulatory Visit: Payer: 59 | Admitting: Behavioral Health

## 2021-11-24 ENCOUNTER — Other Ambulatory Visit: Payer: Self-pay

## 2021-11-24 DIAGNOSIS — F331 Major depressive disorder, recurrent, moderate: Secondary | ICD-10-CM

## 2021-11-24 DIAGNOSIS — F411 Generalized anxiety disorder: Secondary | ICD-10-CM | POA: Diagnosis not present

## 2021-11-24 MED ORDER — SERTRALINE HCL 50 MG PO TABS: 75.0000 mg | ORAL_TABLET | Freq: Every day | ORAL | 3 refills | Status: DC

## 2021-11-24 NOTE — Progress Notes (Signed)
[facility] Check  [name],  MRN: [id number]  PCP: Cari Caraway, MD  Date of Evaluation: 11/24/2021 Time spent:20 minutes  Chief Complaint:  Chief Complaint   Anxiety; Depression; Follow-up; Medication Refill     HISTORY/CURRENT STATUS: HPI 56 year old male presents to this office for follow up and medication management. He is very pleased with how the Zoloft is working so far. He says that he feels much calmer and things do not seem to bother him as much. Says the small increase in his Zoloft last visit has improved his anxiety and depression even more. Says he still struggles to get his wife active and even after successful surgeries, she finds reasons to not participate in activities outside of bed. He reports his anxiety today at 3/10  and depression at 2/10. Says he is sleeping 7-8 hours nightly. Denies mania, no psychosis. No SI/HI.    No Past medication failures        Individual Medical History/ Review of Systems: Changes? :No   Allergies: Iohexol  Current Medications:  Current Outpatient Medications:    benazepril-hydrochlorthiazide (LOTENSIN HCT) 10-12.5 MG tablet, Take 1 tablet by mouth 2 (two) times daily., Disp: 60 tablet, Rfl: 0   buPROPion (WELLBUTRIN SR) 200 MG 12 hr tablet, Take 1 tablet (200 mg total) by mouth 2 (two) times daily., Disp: 60 tablet, Rfl: 0   fexofenadine (ALLEGRA) 180 MG tablet, Take 180 mg by mouth daily., Disp: , Rfl:    pregabalin (LYRICA) 150 MG capsule, Take 150 mg by mouth 2 (two) times daily., Disp: , Rfl:    sertraline (ZOLOFT) 50 MG tablet, Take 1.5 tablets (75 mg total) by mouth daily., Disp: 45 tablet, Rfl: 3   tirzepatide (MOUNJARO) 5 MG/0.5ML Pen, Inject 1 pen (5 mg) into the skin once a week., Disp: 2 mL, Rfl: 0   Travoprost, BAK Free, (TRAVATAN) 0.004 % SOLN ophthalmic solution, Place 1 drop into both eyes at bedtime., Disp: , Rfl:  Medication Side Effects: none  Family Medical/ Social History:  Changes? No  MENTAL HEALTH EXAM:  There were no vitals taken for this visit.There is no height or weight on file to calculate BMI.  General Appearance: Casual, Neat, and Well Groomed  Eye Contact:  Good  Speech:  Clear and Coherent  Volume:  Normal  Mood:  NA  Affect:  Appropriate  Thought Process:  Coherent  Orientation:  Full (Time, Place, and Person)  Thought Content: Logical   Suicidal Thoughts:  No  Homicidal Thoughts:  No  Memory:  WNL  Judgement:  Good  Insight:  Good  Psychomotor Activity:  Normal  Concentration:  Concentration: Good  Recall:  Good  Fund of Knowledge: Good  Language: Good  Assets:  Desire for Improvement  ADL's:  Intact  Cognition: WNL  Prognosis:  Good    DIAGNOSES:    ICD-10-CM   1. Generalized anxiety disorder  F41.1 sertraline (ZOLOFT) 50 MG tablet    2. Major depressive disorder, recurrent episode, moderate (HCC)  F33.1 sertraline (ZOLOFT) 50 MG tablet      Receiving Psychotherapy: No    RECOMMENDATIONS:  Greater than 50% of  20 min face to face time with patient was spent on counseling and coordination of care. We discussed his significant improvement with depression and anxiety. 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 He did finally take a two  week vacation in which he said he enjoyed and felt much better. His spouse completed hip surgery but is still making excuses to not be active. We discussed the following plan: Continue Zoloft to 75 mg daily Continue Wellbutrin 200 mg SR two times daily To report side effects or worsening symptoms promptly Provided emergency contact information To follow up in 2 months to reassess. Reviewed PDMP   Elwanda [place], NP

## 2021-11-30 ENCOUNTER — Ambulatory Visit (INDEPENDENT_AMBULATORY_CARE_PROVIDER_SITE_OTHER): Payer: 59 | Admitting: Family Medicine

## 2021-11-30 ENCOUNTER — Other Ambulatory Visit: Payer: Self-pay

## 2021-11-30 ENCOUNTER — Encounter (INDEPENDENT_AMBULATORY_CARE_PROVIDER_SITE_OTHER): Payer: Self-pay | Admitting: Family Medicine

## 2021-11-30 ENCOUNTER — Other Ambulatory Visit (HOSPITAL_COMMUNITY): Payer: Self-pay

## 2021-11-30 VITALS — BP 107/72 | HR 65 | Temp 98.2°F | Ht 69.0 in | Wt 246.0 lb

## 2021-11-30 DIAGNOSIS — F3289 Other specified depressive episodes: Secondary | ICD-10-CM | POA: Diagnosis not present

## 2021-11-30 DIAGNOSIS — Z6836 Body mass index (BMI) 36.0-36.9, adult: Secondary | ICD-10-CM

## 2021-11-30 DIAGNOSIS — E8881 Metabolic syndrome: Secondary | ICD-10-CM

## 2021-11-30 DIAGNOSIS — Z9189 Other specified personal risk factors, not elsewhere classified: Secondary | ICD-10-CM | POA: Diagnosis not present

## 2021-11-30 DIAGNOSIS — E559 Vitamin D deficiency, unspecified: Secondary | ICD-10-CM

## 2021-11-30 DIAGNOSIS — Z6841 Body Mass Index (BMI) 40.0 and over, adult: Secondary | ICD-10-CM

## 2021-11-30 MED ORDER — BUPROPION HCL ER (SR) 200 MG PO TB12
200.0000 mg | ORAL_TABLET | Freq: Two times a day (BID) | ORAL | 0 refills | Status: DC
  Filled 2021-11-30: qty 60, 30d supply, fill #0

## 2021-11-30 MED ORDER — VITAMIN D (ERGOCALCIFEROL) 1.25 MG (50000 UNIT) PO CAPS
50000.0000 [IU] | ORAL_CAPSULE | ORAL | 0 refills | Status: DC
  Filled 2021-11-30: qty 4, 28d supply, fill #0

## 2021-11-30 MED ORDER — TIRZEPATIDE 5 MG/0.5ML ~~LOC~~ SOAJ
5.0000 mg | SUBCUTANEOUS | 0 refills | Status: DC
  Filled 2021-11-30: qty 2, 28d supply, fill #0

## 2021-12-01 NOTE — Progress Notes (Signed)
Chief Complaint:   OBESITY Kenneth Green is here to discuss his progress with his obesity treatment plan along with follow-up of his obesity related diagnoses. Kenneth Green is on keeping a food journal and adhering to recommended goals of 1500-1800 calories and 100+ grams of protein daily and states he is following his eating plan approximately 90% of the time. Kenneth Green states he is walking for 90 minutes 5 times per week.  Today's visit was #: 25 Starting weight: 308 lbs Starting date: 04/10/2019 Today's weight: 246 lbs Today's date: 11/30/2021 Total lbs lost to date: 62 Total lbs lost since last in-office visit: 1  Interim History: Kenneth Green has done well with avoiding holiday weight gain. He continue to work on diet and exercise, and decreasing simple carbohydrates. Her hunger is mostly controlled.  Subjective:   1. Insulin resistance Kenneth Green was unable to get Kenneth Green last month due to pharmacy error. He asks if I can send the prescription in again.  2. Vitamin D deficiency Kenneth Green's Vit D level is below goal which is expected over the Winter. I discussed labs with the patient.  3. Other depression with emotional eating Kenneth Green's mood is stable on his medications, with no side effects noted. He notes decreased emotional eating even over Christmas.   4. At risk for diabetes mellitus Kenneth Green is at higher than average risk for developing diabetes due to obesity.   Assessment/Plan:   1. Insulin resistance Kenneth Green will continue to work on weight loss, exercise, and decreasing simple carbohydrates to help decrease the risk of diabetes. We will refill Mounjaro for 1 month. Kenneth Green agreed to follow-up with [place] as directed to closely monitor his progress.  - tirzepatide (MOUNJARO) 5 MG/0.5ML Pen; Inject 1 pen (5 mg) into the skin once a week.  Dispense: 2 mL; Refill: 0  2. Vitamin D deficiency We will refill prescription Vitamin D for 1 month. We will recheck labs in 3 months, and Kenneth Green  follow-up for routine testing of Vitamin D, at least 2-3 times per year to avoid over-replacement.  - Vitamin D, Ergocalciferol, (DRISDOL) 1.25 MG (50000 UNIT) CAPS capsule; Take 1 capsule (50,000 Units total) by mouth every 7 (seven) days.  Dispense: 4 capsule; Refill: 0  3. Other depression with emotional eating Behavior modification techniques were discussed today to help Kenneth Green deal with his emotional/non-hunger eating behaviors. We will refill Wellbutrin SR for 1 month. Orders and follow up as documented in patient record.   - buPROPion (WELLBUTRIN SR) 200 MG 12 hr tablet; Take 1 tablet (200 mg total) by mouth 2 (two) times daily.  Dispense: 60 tablet; Refill: 0  4. At risk for diabetes mellitus Kenneth Green was given approximately 15 minutes of diabetes education and counseling today. We discussed intensive lifestyle modifications today with an emphasis on weight loss as well as increasing exercise and decreasing simple carbohydrates in his diet. We also reviewed medication options with an emphasis on risk versus benefit of those discussed.   Repetitive spaced learning was employed today to elicit superior memory formation and behavioral change.  5. Obesity BMI today is 80 Kenneth Green is currently in the action stage of change. As such, his goal is to continue with weight loss efforts. He has agreed to keeping a food journal and adhering to recommended goals of 1500-1800 calories and 100+ grams of protein daily.   Exercise goals: As is.  Behavioral modification strategies: meal planning and cooking strategies.  Kenneth Green has agreed to follow-up with our clinic in 3 weeks. He was  informed of the importance of frequent follow-up visits to maximize his success with intensive lifestyle modifications for his multiple health conditions.   Objective:   Blood pressure 107/72, pulse 65, temperature 98.2 F (36.8 C), temperature source Oral, height 5\' 9"  (1.753 m), weight 246 lb (111.6 kg), SpO2 99  %. Body mass index is 36.33 kg/m.  General: Cooperative, alert, well developed, in no acute distress. HEENT: Conjunctivae and lids unremarkable. Cardiovascular: Regular rhythm.  Lungs: Normal work of breathing. Neurologic: No focal deficits.   Lab Results  Component Value Date   CREATININE 1.02 11/11/2021   BUN 17 11/11/2021   NA 139 11/11/2021   K 3.7 11/11/2021   CL 102 11/11/2021   CO2 21 11/11/2021   Lab Results  Component Value Date   ALT 37 11/11/2021   AST 35 11/11/2021   ALKPHOS 78 11/11/2021   BILITOT 0.5 11/11/2021   Lab Results  Component Value Date   HGBA1C 5.4 11/11/2021   HGBA1C 5.9 (H) 01/15/2021   HGBA1C 5.8 (H) 07/23/2020   HGBA1C 5.6 12/19/2019   HGBA1C 5.7 (H) 09/03/2019   Lab Results  Component Value Date   INSULIN 20.5 11/11/2021   INSULIN 12.8 01/15/2021   INSULIN 18.1 07/23/2020   INSULIN 27.0 (H) 12/19/2019   INSULIN 24.2 09/03/2019   Lab Results  Component Value Date   TSH 1.340 04/10/2019   Lab Results  Component Value Date   CHOL 182 11/11/2021   HDL 51 11/11/2021   LDLCALC 111 (H) 11/11/2021   TRIG 111 11/11/2021   Lab Results  Component Value Date   VD25OH 34.5 11/11/2021   VD25OH 35.0 05/11/2021   VD25OH 34.3 01/15/2021   Lab Results  Component Value Date   WBC 6.0 04/10/2019   HGB 13.4 04/10/2019   HCT 39.2 04/10/2019   MCV 82 04/10/2019   PLT 235 06/15/2016   No results found for: IRON, TIBC, FERRITIN  Attestation Statements:   Reviewed by clinician on day of visit: allergies, medications, problem list, medical history, surgical history, family history, social history, and previous encounter notes.   I, Trixie Dredge, am acting as transcriptionist for Dennard Nip, MD.  I have reviewed the above documentation for accuracy and completeness, and I agree with the above. -  Dennard Nip, MD

## 2021-12-15 ENCOUNTER — Ambulatory Visit: Payer: 59 | Admitting: Psychology

## 2021-12-22 ENCOUNTER — Other Ambulatory Visit (HOSPITAL_COMMUNITY): Payer: Self-pay

## 2021-12-22 ENCOUNTER — Ambulatory Visit (INDEPENDENT_AMBULATORY_CARE_PROVIDER_SITE_OTHER): Payer: 59 | Admitting: Family Medicine

## 2021-12-22 ENCOUNTER — Other Ambulatory Visit: Payer: Self-pay

## 2021-12-22 ENCOUNTER — Encounter (INDEPENDENT_AMBULATORY_CARE_PROVIDER_SITE_OTHER): Payer: Self-pay | Admitting: Family Medicine

## 2021-12-22 VITALS — BP 118/77 | HR 71 | Temp 97.9°F | Ht 69.0 in | Wt 242.0 lb

## 2021-12-22 DIAGNOSIS — Z6835 Body mass index (BMI) 35.0-35.9, adult: Secondary | ICD-10-CM | POA: Diagnosis not present

## 2021-12-22 DIAGNOSIS — E559 Vitamin D deficiency, unspecified: Secondary | ICD-10-CM

## 2021-12-22 DIAGNOSIS — E669 Obesity, unspecified: Secondary | ICD-10-CM

## 2021-12-22 DIAGNOSIS — R7303 Prediabetes: Secondary | ICD-10-CM

## 2021-12-22 DIAGNOSIS — Z9189 Other specified personal risk factors, not elsewhere classified: Secondary | ICD-10-CM

## 2021-12-22 DIAGNOSIS — Z6841 Body Mass Index (BMI) 40.0 and over, adult: Secondary | ICD-10-CM

## 2021-12-22 MED ORDER — VITAMIN D (ERGOCALCIFEROL) 1.25 MG (50000 UNIT) PO CAPS
50000.0000 [IU] | ORAL_CAPSULE | ORAL | 0 refills | Status: DC
  Filled 2021-12-22: qty 4, 28d supply, fill #0

## 2021-12-22 MED ORDER — TIRZEPATIDE 5 MG/0.5ML ~~LOC~~ SOAJ
5.0000 mg | SUBCUTANEOUS | 0 refills | Status: DC
Start: 2021-12-22 — End: 2022-01-18
  Filled 2021-12-22: qty 2, 28d supply, fill #0

## 2021-12-22 NOTE — Progress Notes (Signed)
Chief Complaint:   OBESITY Kenneth Green is here to discuss his progress with his obesity treatment plan along with follow-up of his obesity related diagnoses. Kenneth Green is on keeping a food journal and adhering to recommended goals of 1500-1800 calories and 100+ grams of protein daily and states he is following his eating plan approximately 95% of the time. Kenneth Green states he is walking for 90 minutes 5 times per week.  Today's visit was #: 75 Starting weight: 308 lbs Starting date: 04/10/2019 Today's weight: 242 lbs Today's date: 12/22/2021 Total lbs lost to date: 66 Total lbs lost since last in-office visit: 4  Interim History: Haley continues to do well with weight loss. He is doing well with meeting his protein and calories goals, and he is doing well with journaling most days.  Subjective:   1. Pre-diabetes Kenneth Green's last A1c was well controlled at 5.4. He notes decreased polyphagia, and he denies polyuria or polydipsia.  2. Vitamin D deficiency Kenneth Green is on Vit D, but his level is not yet at goal. No side effects were noted.  3. At risk for heart disease Kenneth Green is at higher than average risk for cardiovascular disease due to obesity.  Assessment/Plan:   1. Pre-diabetes We will refill Mounjaro for 1 month. Drury will continue to work on weight loss, exercise, and decreasing simple carbohydrates to help decrease the risk of diabetes.   - tirzepatide (MOUNJARO) 5 MG/0.5ML Pen; Inject 1 pen (5 mg) into the skin once a week.  Dispense: 2 mL; Refill: 0  2. Vitamin D deficiency We will refill prescription Vitamin D for 1 month. Kenneth Green will follow-up for routine testing of Vitamin D, at least 2-3 times per year to avoid over-replacement.  - Vitamin D, Ergocalciferol, (DRISDOL) 1.25 MG (50000 UNIT) CAPS capsule; Take 1 capsule by mouth every 7 days.  Dispense: 4 capsule; Refill: 0  3. At risk for heart disease Kenneth Green was given approximately 15 minutes of coronary artery  disease prevention counseling today. He is 56 y.o. male and has risk factors for heart disease including obesity. We discussed intensive lifestyle modifications today with an emphasis on specific weight loss instructions and strategies.  Repetitive spaced learning was employed today to elicit superior memory formation and behavioral change.   4. Obesity BMI today is 35.8 Kenneth Green is currently in the action stage of change. As such, his goal is to continue with weight loss efforts. He has agreed to keeping a food journal and adhering to recommended goals of 1500-1800 calories and 100+ grams of protein daily.   Exercise goals: For substantial health benefits, adults should do at least 150 minutes (2 hours and 30 minutes) a week of moderate-intensity, or 75 minutes (1 hour and 15 minutes) a week of vigorous-intensity aerobic physical activity, or an equivalent combination of moderate- and vigorous-intensity aerobic activity. Aerobic activity should be performed in episodes of at least 10 minutes, and preferably, it should be spread throughout the week.  Behavioral modification strategies: increasing lean protein intake.  Kenneth Green has agreed to follow-up with our clinic in 3 to 4 weeks. He was informed of the importance of frequent follow-up visits to maximize his success with intensive lifestyle modifications for his multiple health conditions.   Objective:   Blood pressure 118/77, pulse 71, temperature 97.9 F (36.6 C), height 5\' 9"  (1.753 m), weight 242 lb (109.8 kg), SpO2 99 %. Body mass index is 35.74 kg/m.  General: Cooperative, alert, well developed, in no acute distress. HEENT: Conjunctivae and  lids unremarkable. Cardiovascular: Regular rhythm.  Lungs: Normal work of breathing. Neurologic: No focal deficits.   Lab Results  Component Value Date   CREATININE 1.02 11/11/2021   BUN 17 11/11/2021   NA 139 11/11/2021   K 3.7 11/11/2021   CL 102 11/11/2021   CO2 21 11/11/2021   Lab  Results  Component Value Date   ALT 37 11/11/2021   AST 35 11/11/2021   ALKPHOS 78 11/11/2021   BILITOT 0.5 11/11/2021   Lab Results  Component Value Date   HGBA1C 5.4 11/11/2021   HGBA1C 5.9 (H) 01/15/2021   HGBA1C 5.8 (H) 07/23/2020   HGBA1C 5.6 12/19/2019   HGBA1C 5.7 (H) 09/03/2019   Lab Results  Component Value Date   INSULIN 20.5 11/11/2021   INSULIN 12.8 01/15/2021   INSULIN 18.1 07/23/2020   INSULIN 27.0 (H) 12/19/2019   INSULIN 24.2 09/03/2019   Lab Results  Component Value Date   TSH 1.340 04/10/2019   Lab Results  Component Value Date   CHOL 182 11/11/2021   HDL 51 11/11/2021   LDLCALC 111 (H) 11/11/2021   TRIG 111 11/11/2021   Lab Results  Component Value Date   VD25OH 34.5 11/11/2021   VD25OH 35.0 05/11/2021   VD25OH 34.3 01/15/2021   Lab Results  Component Value Date   WBC 6.0 04/10/2019   HGB 13.4 04/10/2019   HCT 39.2 04/10/2019   MCV 82 04/10/2019   PLT 235 06/15/2016   No results found for: IRON, TIBC, FERRITIN  Attestation Statements:   Reviewed by clinician on day of visit: allergies, medications, problem list, medical history, surgical history, family history, social history, and previous encounter notes.   I, Trixie Dredge, am acting as transcriptionist for Dennard Nip, MD.  I have reviewed the above documentation for accuracy and completeness, and I agree with the above. -  Dennard Nip, MD

## 2021-12-23 ENCOUNTER — Other Ambulatory Visit (HOSPITAL_COMMUNITY): Payer: Self-pay

## 2021-12-24 ENCOUNTER — Ambulatory Visit: Payer: 59 | Admitting: Psychology

## 2022-01-06 ENCOUNTER — Ambulatory Visit (INDEPENDENT_AMBULATORY_CARE_PROVIDER_SITE_OTHER): Payer: 59 | Admitting: Psychology

## 2022-01-06 DIAGNOSIS — F4323 Adjustment disorder with mixed anxiety and depressed mood: Secondary | ICD-10-CM | POA: Diagnosis not present

## 2022-01-06 NOTE — Progress Notes (Signed)
[place] Counselor/Therapist Progress Note  [name], MRN: [id number],    Date: 01/06/2022  Time Spent: 50 mins  Treatment Type: Individual Therapy  Reported Symptoms: Pt presented via webex video for follow up session, granting consent for the session.  Pt stated he was in his home and no one else was present.  I shared with pt that I am in my office and no one else is present.  Pt shares he has lost more weight on his eating program and is pleased with the results; he has lost about 42 pounds since Nov and about 60+ pounds overall.  Mental Status Exam: Appearance:  Casual     Behavior: Appropriate  Motor: Normal  Speech/Language:  Clear and Coherent  Affect: Appropriate  Mood: normal  Thought process: normal  Thought content:   WNL  Sensory/Perceptual disturbances:   WNL  Orientation: oriented to person, place, and time/date  Attention: Good  Concentration: Good  Memory: WNL  Fund of knowledge:  Good  Insight:   Good  Judgment:  Good  Impulse Control: Good   Risk Assessment: Danger to Self:  No Self-injurious Behavior: No Danger to Others: No Duty to Warn:no Physical Aggression / Violence:No  Access to Firearms a concern: No  Gang Involvement:No   Subjective: Pt shares that "work is going.  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  She is recovering and will be doing PT eventually for that as well.  He is not sure what the rehab schedule is for this most recent procedure.  Pt shares that he has been eating right and he has done a couple of re-enactment events since our last session and has one this weekend in [facility].  Pt is looking forward to going to the event.  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  He is also  keeping the grand baby once per week and he enjoys that.  Both of his daughters are doing well at this point.  His mom continues to improve after her heart procedure.  Pt shares his back is doing OK; "I have to be mindful at work not to do things that I shouldn't do."  Pt continues to work 5p-5a, M-W, and F.  Pt shares "I have been sleeping pretty good; I had a sleep study done recently and I am doing well with my CPAP machine."  Pt shares that he is proud of his weight loss work to this point; "snacks are my weakness so I have to stay as far away from them as I can." Pt shares his PCP has mentioned to him that he may be able decrease or eliminate his BP medication, if he continues to lose weight.  Pt shares, "I am still struggling with the [place] and [employer] thing.  I am ready to give it up then we gather for an event and I enjoy it so I am less inclined to quit.  I am not sure what to do.  I am trying to get the group together for a Zoom mtg for a future planning mtg.  I would like for one of the other members of the group to take over the point person role."  Encouraged pt to continue to celebrate his successes and try to be as intentional as possible about doing so.  We will talk more about this topic in our follow up session in 4 wks.  Interventions: Cognitive Behavioral Therapy  Diagnosis:Adjustment disorder with mixed anxiety and depressed mood  Plan: Treatment Plan Strengths/Abilities:  Intelligent, Intuitive, Willing to participate in therapy Treatment Preferences:  Outpatient Individual Therapy Statement of Needs:  Patient is to use CBT, mindfulness and coping skills to help manage and/or decrease symptoms associated with their diagnosis. Symptoms:  Depressed/Irritable mood, worry, social withdrawal Problems Addressed:  Depressive thoughts, Sadness, Sleep issues, etc. Long Term Goals:  Pt to reduce overall level, frequency, and intensity of the feelings of depression as evidenced by decreased  irritability, negative self talk, and helpless feelings from 6 to 7 days/week to 0 to 1 days/week, per client report, for at least 3 consecutive months.  Progress: 20% Short Term Goals:  Pt to verbally express understanding of the relationship between feelings of depression and their impact on thinking patterns and behaviors.  Pt to verbalize an understanding of the role that distorted thinking plays in creating fears, excessive worry, and ruminations.  Progress: 20% Target Date:  11/17/2022 Frequency:  Bi-weekly Modality:  Cognitive Behavioral Therapy Interventions by Therapist:  Therapist will use CBT, Mindfulness exercises, Coping skills and Referrals, as needed by client. Client has verbally approved this treatment plan.  Ivan Anchors, [facility]

## 2022-01-11 ENCOUNTER — Other Ambulatory Visit: Payer: Self-pay | Admitting: Behavioral Health

## 2022-01-11 ENCOUNTER — Other Ambulatory Visit (INDEPENDENT_AMBULATORY_CARE_PROVIDER_SITE_OTHER): Payer: Self-pay | Admitting: Family Medicine

## 2022-01-11 DIAGNOSIS — F331 Major depressive disorder, recurrent, moderate: Secondary | ICD-10-CM

## 2022-01-11 DIAGNOSIS — F411 Generalized anxiety disorder: Secondary | ICD-10-CM

## 2022-01-11 DIAGNOSIS — F3289 Other specified depressive episodes: Secondary | ICD-10-CM

## 2022-01-11 NOTE — Telephone Encounter (Signed)
Mychart message sent.

## 2022-01-18 ENCOUNTER — Other Ambulatory Visit: Payer: Self-pay

## 2022-01-18 ENCOUNTER — Ambulatory Visit (INDEPENDENT_AMBULATORY_CARE_PROVIDER_SITE_OTHER): Payer: 59 | Admitting: Family Medicine

## 2022-01-18 ENCOUNTER — Encounter (INDEPENDENT_AMBULATORY_CARE_PROVIDER_SITE_OTHER): Payer: Self-pay | Admitting: Family Medicine

## 2022-01-18 ENCOUNTER — Other Ambulatory Visit (HOSPITAL_COMMUNITY): Payer: Self-pay

## 2022-01-18 VITALS — BP 102/68 | HR 68 | Temp 98.0°F | Ht 69.0 in | Wt 236.0 lb

## 2022-01-18 DIAGNOSIS — E669 Obesity, unspecified: Secondary | ICD-10-CM

## 2022-01-18 DIAGNOSIS — Z6835 Body mass index (BMI) 35.0-35.9, adult: Secondary | ICD-10-CM | POA: Diagnosis not present

## 2022-01-18 DIAGNOSIS — E119 Type 2 diabetes mellitus without complications: Secondary | ICD-10-CM

## 2022-01-18 DIAGNOSIS — F3289 Other specified depressive episodes: Secondary | ICD-10-CM

## 2022-01-18 DIAGNOSIS — Z9189 Other specified personal risk factors, not elsewhere classified: Secondary | ICD-10-CM

## 2022-01-18 MED ORDER — BUPROPION HCL ER (SR) 200 MG PO TB12
200.0000 mg | ORAL_TABLET | Freq: Two times a day (BID) | ORAL | 0 refills | Status: DC
  Filled 2022-01-18: qty 60, 30d supply, fill #0

## 2022-01-18 MED ORDER — TIRZEPATIDE 5 MG/0.5ML ~~LOC~~ SOAJ
5.0000 mg | SUBCUTANEOUS | 0 refills | Status: DC
  Filled 2022-01-18: qty 2, 28d supply, fill #0

## 2022-01-19 LAB — BASIC METABOLIC PANEL
BUN: 19 (ref 4–21)
CO2: 26 — AB (ref 13–22)
Chloride: 99 (ref 99–108)
Creatinine: 1.1 (ref 0.6–1.3)
Glucose: 89
Potassium: 3.9 mEq/L (ref 3.5–5.1)
Sodium: 134 — AB (ref 137–147)

## 2022-01-19 LAB — LIPID PANEL
HDL: 47 (ref 35–70)
LDL Cholesterol: 93
Triglycerides: 166 — AB (ref 40–160)

## 2022-01-19 LAB — COMPREHENSIVE METABOLIC PANEL WITH GFR
Albumin: 4.5 (ref 3.5–5.0)
Calcium: 9.9 (ref 8.7–10.7)
Globulin: 2.2
eGFR: 76

## 2022-01-19 LAB — HEPATIC FUNCTION PANEL
ALT: 22 U/L (ref 10–40)
AST: 20 (ref 14–40)
Alkaline Phosphatase: 69 (ref 25–125)
Bilirubin, Total: 0.3

## 2022-01-19 LAB — HEMOGLOBIN A1C: Hemoglobin A1C: 5.4

## 2022-01-19 LAB — MICROALBUMIN / CREATININE URINE RATIO: Microalb Creat Ratio: 2

## 2022-01-19 NOTE — Progress Notes (Signed)
Chief Complaint:   OBESITY Kenneth Green is here to discuss his progress with his obesity treatment plan along with follow-up of his obesity related diagnoses. Kenneth Green is on keeping a food journal and adhering to recommended goals of 1500-1800 calories and 100+ grams of protein daily and states he is following his eating plan approximately 90% of the time. Kenneth Green states he is walking for 60 minutes 4-5 times per week.  Today's visit was #: 99 Starting weight: 308 lbs Starting date: 04/10/2019 Today's weight: 236 lbs Today's date: 01/18/2022 Total lbs lost to date: 72 Total lbs lost since last in-office visit: 6  Interim History: Kenneth Green continues to do well with weight loss. He is working on decreasing simple carbohydrates, but this has been especially challenging. He notes increased cravings on weekends more than weekdays.  Subjective:   1. Controlled type 2 diabetes mellitus without complication, without long-term current use of insulin (HCC) Kenneth Green continues to work on diet and weight loss. He is doing well on Mounjaro with no side effects noted.  2. Other depression with emotional eating Kenneth Green had his Zoloft increased approximately 1 month ago, and he feels his mood has improved. He notes some increased cravings which may or may not be related. He is stable on Wellbutrin.  3. At risk for heart disease [place] is at higher than average risk for cardiovascular disease due to obesity.  Assessment/Plan:   1. Controlled type 2 diabetes mellitus without complication, without long-term current use of insulin ([place]) Jesselee will continue [place], and we will refill for 1 month. Good blood sugar control is important to decrease the likelihood of diabetic complications such as nephropathy, neuropathy, limb loss, blindness, coronary artery disease, and death. Intensive lifestyle modification including diet, exercise and weight loss are the first line of treatment for diabetes.   -  tirzepatide (MOUNJARO) 5 MG/0.5ML Pen; Inject 1 pen (5 mg) into the skin once a week.  Dispense: 2 mL; Refill: 0  2. Other depression with emotional eating We will refill Wellbutrin SR for 1 month. Kenneth Green will continue Zoloft at 1.5 pills daily, and will continue to monitor. Orders and follow up as documented in patient record.    - buPROPion (WELLBUTRIN SR) 200 MG 12 hr tablet; Take 1 tablet (200 mg total) by mouth 2 (two) times daily.  Dispense: 60 tablet; Refill: 0  3. At risk for heart disease Kenneth Green was given approximately 15 minutes of coronary artery disease prevention counseling today. He is 56 y.o. male and has risk factors for heart disease including obesity. We discussed intensive lifestyle modifications today with an emphasis on specific weight loss instructions and strategies.  Repetitive spaced learning was employed today to elicit superior memory formation and behavioral change.   4. Obesity with current BMI 35.0 [place] is currently in the action stage of change. As such, his goal is to continue with weight loss efforts. He has agreed to keeping a food journal and adhering to recommended goals of 1500-1800 calories and 100+ grams of protein daily.   Kenneth Green was encouraged to continue to be mindful of stressors or situations which contribute to carbohydrate cravings.  Exercise goals: As is.  Behavioral modification strategies: increasing lean protein intake and emotional eating strategies.  Kenneth Green has agreed to follow-up with our clinic in 3 to 4 weeks. He was informed of the importance of frequent follow-up visits to maximize his success with intensive lifestyle modifications for his multiple health conditions.   Objective:   Blood pressure  102/68, pulse 68, temperature 98 F (36.7 C), height 5\' 9"  (1.753 m), weight 236 lb (107 kg), SpO2 99 %. Body mass index is 34.85 kg/m.  General: Cooperative, alert, well developed, in no acute distress. HEENT: Conjunctivae and  lids unremarkable. Cardiovascular: Regular rhythm.  Lungs: Normal work of breathing. Neurologic: No focal deficits.   Lab Results  Component Value Date   CREATININE 1.02 11/11/2021   BUN 17 11/11/2021   NA 139 11/11/2021   K 3.7 11/11/2021   CL 102 11/11/2021   CO2 21 11/11/2021   Lab Results  Component Value Date   ALT 37 11/11/2021   AST 35 11/11/2021   ALKPHOS 78 11/11/2021   BILITOT 0.5 11/11/2021   Lab Results  Component Value Date   HGBA1C 5.4 11/11/2021   HGBA1C 5.9 (H) 01/15/2021   HGBA1C 5.8 (H) 07/23/2020   HGBA1C 5.6 12/19/2019   HGBA1C 5.7 (H) 09/03/2019   Lab Results  Component Value Date   INSULIN 20.5 11/11/2021   INSULIN 12.8 01/15/2021   INSULIN 18.1 07/23/2020   INSULIN 27.0 (H) 12/19/2019   INSULIN 24.2 09/03/2019   Lab Results  Component Value Date   TSH 1.340 04/10/2019   Lab Results  Component Value Date   CHOL 182 11/11/2021   HDL 51 11/11/2021   LDLCALC 111 (H) 11/11/2021   TRIG 111 11/11/2021   Lab Results  Component Value Date   VD25OH 34.5 11/11/2021   VD25OH 35.0 05/11/2021   VD25OH 34.3 01/15/2021   Lab Results  Component Value Date   WBC 6.0 04/10/2019   HGB 13.4 04/10/2019   HCT 39.2 04/10/2019   MCV 82 04/10/2019   PLT 235 06/15/2016   No results found for: IRON, TIBC, FERRITIN  Attestation Statements:   Reviewed by clinician on day of visit: allergies, medications, problem list, medical history, surgical history, family history, social history, and previous encounter notes.   I, Trixie Dredge, am acting as transcriptionist for Dennard Nip, MD.  I have reviewed the above documentation for accuracy and completeness, and I agree with the above. -  Dennard Nip, MD

## 2022-01-25 ENCOUNTER — Ambulatory Visit (INDEPENDENT_AMBULATORY_CARE_PROVIDER_SITE_OTHER): Payer: 59 | Admitting: Family Medicine

## 2022-02-04 ENCOUNTER — Ambulatory Visit: Payer: 59 | Admitting: Psychology

## 2022-02-04 DIAGNOSIS — F4323 Adjustment disorder with mixed anxiety and depressed mood: Secondary | ICD-10-CM | POA: Diagnosis not present

## 2022-02-04 NOTE — Progress Notes (Signed)
[place] Counselor/Therapist Progress Note ? ?[name], MRN: [id number],   ? ?Date: 02/04/2022 ? ?Time Spent: 50 mins ? ?Treatment Type: Individual Therapy ? ?Reported Symptoms: Pt presented via webex video for follow up session, granting consent for the session.  Pt stated he was in his car and no one else was present.  I shared with pt that I am in my office and no one else is present. ? ?Mental Status Exam: ?Appearance:  Casual     ?Behavior: Appropriate  ?Motor: Normal  ?Speech/Language:  Clear and Coherent  ?Affect: Appropriate  ?Mood: normal  ?Thought process: normal  ?Thought content:   WNL  ?Sensory/Perceptual disturbances:   WNL  ?Orientation: oriented to person, place, and time/date  ?Attention: Good  ?Concentration: Good  ?Memory: WNL  ?Fund of knowledge:  Good  ?Insight:   Good  ?Judgment:  Good  ?Impulse Control: Good  ? ?Risk Assessment: ?Danger to Self:  No ?Self-injurious Behavior: No ?Danger to Others: No ?Duty to Warn:no ?Physical Aggression / Violence:No  ?Access to Firearms a concern: No  ?Gang Involvement:No  ? ?Subjective: Pt shares that Kenneth Green has recently started talking about the possibility of seeing a therapist for her.  "She knows she is struggling with depression and wants to feel better."  Pt shares that Kenneth Green continues to do physical therapy for her elbow replacement and she is seeing pt's chiropractor and that seems to be helping her better than other options.  Pt is very busy taking Kenneth Green to various appts.  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  Pt shares that he has been less successful at eating healthy recently; "I am afraid that I am turning into an alcoholic or a sugar addict.  I do well for a few days and then I fall off the wagon and I feel guilty, so I will eat more sugar or drink more."  Pt shares he does not drink during the week.   On the weekends, he believes he drinks more than he needs to.  He shares that he does not get drunk but he feels like he drinks more than he wants to do.  Pt shares he is probably happier right now than at any other time in his life.  Asked pt to think about if his use of alcohol and cookies gives pt the opportunity to control aspects of his life because he has so little control of other aspects of his life. I also asked pt to think about decreasing the "demonization" of the cookies and the alcohol.  We will talk more about these topics in our follow up session in 4 wks. ? ?Interventions: Cognitive Behavioral Therapy ? ?Diagnosis:Adjustment disorder with mixed anxiety and depressed mood ? ?Plan: Treatment Plan ?Strengths/Abilities:  Intelligent, Intuitive, Willing to participate in therapy ?Treatment Preferences:  Outpatient Individual Therapy ?Statement of Needs:  Patient is to use CBT, mindfulness and coping skills to help manage and/or decrease symptoms associated with their diagnosis. ?Symptoms:  Depressed/Irritable mood, worry, social withdrawal ?Problems Addressed:  Depressive thoughts, Sadness, Sleep issues, etc. ?Long Term Goals:  Pt to reduce overall level, frequency, and intensity of the feelings of depression as evidenced by decreased irritability, negative self talk, and helpless feelings from 6 to 7 days/week to 0 to 1 days/week, per client report, for at least 3 consecutive months.  Progress: 20% ?Short  Term Goals:  Pt to verbally express understanding of the relationship between feelings of depression and their impact on thinking patterns and behaviors.  Pt to verbalize an understanding of the role that distorted thinking plays in creating fears, excessive worry, and ruminations.  Progress: 20% ?Target Date:  11/17/2022 ?Frequency:  Bi-weekly ?Modality:  Cognitive Behavioral Therapy ?Interventions by Therapist:  Therapist will use CBT, Mindfulness exercises, Coping skills and Referrals, as needed by  client. ?Client has verbally approved this treatment plan. ? ?Ivan Anchors, [facility] ?

## 2022-02-09 ENCOUNTER — Other Ambulatory Visit: Payer: Self-pay

## 2022-02-09 ENCOUNTER — Encounter (INDEPENDENT_AMBULATORY_CARE_PROVIDER_SITE_OTHER): Payer: Self-pay | Admitting: Family Medicine

## 2022-02-09 ENCOUNTER — Ambulatory Visit (INDEPENDENT_AMBULATORY_CARE_PROVIDER_SITE_OTHER): Payer: 59 | Admitting: Family Medicine

## 2022-02-09 ENCOUNTER — Other Ambulatory Visit (HOSPITAL_COMMUNITY): Payer: Self-pay

## 2022-02-09 VITALS — BP 120/78 | HR 61 | Temp 97.7°F | Ht 69.0 in | Wt 239.0 lb

## 2022-02-09 DIAGNOSIS — Z7985 Long-term (current) use of injectable non-insulin antidiabetic drugs: Secondary | ICD-10-CM

## 2022-02-09 DIAGNOSIS — Z9189 Other specified personal risk factors, not elsewhere classified: Secondary | ICD-10-CM

## 2022-02-09 DIAGNOSIS — I10 Essential (primary) hypertension: Secondary | ICD-10-CM

## 2022-02-09 DIAGNOSIS — E1169 Type 2 diabetes mellitus with other specified complication: Secondary | ICD-10-CM

## 2022-02-09 DIAGNOSIS — E669 Obesity, unspecified: Secondary | ICD-10-CM | POA: Diagnosis not present

## 2022-02-09 DIAGNOSIS — E119 Type 2 diabetes mellitus without complications: Secondary | ICD-10-CM

## 2022-02-09 DIAGNOSIS — Z6835 Body mass index (BMI) 35.0-35.9, adult: Secondary | ICD-10-CM

## 2022-02-09 MED ORDER — TIRZEPATIDE 7.5 MG/0.5ML ~~LOC~~ SOAJ
7.5000 mg | SUBCUTANEOUS | 0 refills | Status: DC
  Filled 2022-02-09 – 2022-02-23 (×3): qty 2, 28d supply, fill #0

## 2022-02-11 NOTE — Progress Notes (Signed)
? ? ? ?Chief Complaint:  ? ?OBESITY ?Kenneth Green is here to discuss his progress with his obesity treatment plan along with follow-up of his obesity related diagnoses. Kenneth Green is on keeping a food journal and adhering to recommended goals of 1500-1800 calories and 100+ grams of protein daily and states he is following his eating plan approximately 85% of the time. Kenneth Green states he is walking for 45-60 minutes 4 times per week. ? ?Today's visit was #: 69 ?Starting weight: 308 lbs ?Starting date: 04/10/2019 ?Today's weight: 239 lbs ?Today's date: 02/09/2022 ?Total lbs lost to date: 30 ?Total lbs lost since last in-office visit: 0 ? ?Interim History: Kenneth Green notes his hunger is controlled but he has increased cravings which may be stress related. He wants to set a new goal to reach, which he feels will motivate him. ? ?Subjective:  ? ?1. Controlled type 2 diabetes mellitus without complication, without long-term current use of insulin (Kenneth Green) ?Kenneth Green notes increased cravings for simple carbohydrates. He is tolerating Mounjaro well. ? ?2. Essential hypertension ?Kenneth Green's blood pressure is stable on his medications, and he is doing well overall with weight loss despite some recent challenges. He has no signs of hypotension. ? ?3. At risk for heart disease ?Kenneth Green is at higher than average risk for cardiovascular disease due to obesity. ? ?Assessment/Plan:  ? ?1. Controlled type 2 diabetes mellitus without complication, without long-term current use of insulin ([place]) ?Kenneth Green agreed to increase Mounjaro to 7.5 mg q week, and we will refill for 1 month. ? ?- tirzepatide (MOUNJARO) 7.5 MG/0.5ML Pen; Inject 1 pen (7.5 mg) into the skin once a week.  Dispense: 2 mL; Refill: 0 ? ?2. Essential hypertension ?Kenneth Green will continue his diet and exercise, and will continue to monitor.  ? ?3. At risk for heart disease ?Kenneth Green was given approximately 15 minutes of coronary artery disease prevention counseling today. He is 56 y.o. male  and has risk factors for heart disease including obesity. We discussed intensive lifestyle modifications today with an emphasis on specific weight loss instructions and strategies. ? ?Repetitive spaced learning was employed today to elicit superior memory formation and behavioral change.  ? ?4. Obesity with current BMI 35.4 ?Kenneth Green is currently in the action stage of change. As such, his goal is to continue with weight loss efforts. He has agreed to keeping a food journal and adhering to recommended goals of 1500-1600 calories and 100+ grams of protein daily.  ? ?Kenneth Green's visceral fat rating goal is 12 or below. ? ?Exercise goals: As is. ? ?Behavioral modification strategies: increasing lean protein intake and emotional eating strategies. ? ?Kenneth Green has agreed to follow-up with our clinic in 4 weeks. He was informed of the importance of frequent follow-up visits to maximize his success with intensive lifestyle modifications for his multiple health conditions.  ? ?Objective:  ? ?Blood pressure 120/78, pulse 61, temperature 97.7 ?F (36.5 ?C), height '5\' 9"'$  (1.753 m), weight 239 lb (108.4 kg), SpO2 100 %. ?Body mass index is 35.29 kg/m?. ? ?General: Cooperative, alert, well developed, in no acute distress. ?HEENT: Conjunctivae and lids unremarkable. ?Cardiovascular: Regular rhythm.  ?Lungs: Normal work of breathing. ?Neurologic: No focal deficits.  ? ?Lab Results  ?Component Value Date  ? CREATININE 1.1 01/19/2022  ? BUN 19 01/19/2022  ? NA 134 (A) 01/19/2022  ? K 3.9 01/19/2022  ? CL 99 01/19/2022  ? CO2 26 (A) 01/19/2022  ? ?Lab Results  ?Component Value Date  ? ALT 22 01/19/2022  ? AST 20 01/19/2022  ?  ALKPHOS 69 01/19/2022  ? BILITOT 0.5 11/11/2021  ? ?Lab Results  ?Component Value Date  ? HGBA1C 5.4 01/19/2022  ? HGBA1C 5.4 11/11/2021  ? HGBA1C 5.9 (H) 01/15/2021  ? HGBA1C 5.8 (H) 07/23/2020  ? HGBA1C 5.6 12/19/2019  ? ?Lab Results  ?Component Value Date  ? INSULIN 20.5 11/11/2021  ? INSULIN 12.8 01/15/2021  ?  INSULIN 18.1 07/23/2020  ? INSULIN 27.0 (H) 12/19/2019  ? INSULIN 24.2 09/03/2019  ? ?Lab Results  ?Component Value Date  ? TSH 1.340 04/10/2019  ? ?Lab Results  ?Component Value Date  ? CHOL 182 11/11/2021  ? HDL 47 01/19/2022  ? [place] 93 01/19/2022  ? TRIG 166 (A) 01/19/2022  ? ?Lab Results  ?Component Value Date  ? VD25OH 34.5 11/11/2021  ? VD25OH 35.0 05/11/2021  ? VD25OH 34.3 01/15/2021  ? ?Lab Results  ?Component Value Date  ? WBC 6.0 04/10/2019  ? HGB 13.4 04/10/2019  ? HCT 39.2 04/10/2019  ? MCV 82 04/10/2019  ? PLT 235 06/15/2016  ? ?No results found for: IRON, TIBC, FERRITIN ? ?Attestation Statements:  ? ?Reviewed by clinician on day of visit: allergies, medications, problem list, medical history, surgical history, family history, social history, and previous encounter notes. ? ? ?I, Trixie Dredge, am acting as transcriptionist for Dennard Nip, MD. ? ?I have reviewed the above documentation for accuracy and completeness, and I agree with the above. -  Dennard Nip, MD ? ? ?

## 2022-02-18 ENCOUNTER — Other Ambulatory Visit: Payer: Self-pay

## 2022-02-18 ENCOUNTER — Other Ambulatory Visit (INDEPENDENT_AMBULATORY_CARE_PROVIDER_SITE_OTHER): Payer: Self-pay | Admitting: Family Medicine

## 2022-02-18 ENCOUNTER — Other Ambulatory Visit (HOSPITAL_COMMUNITY): Payer: Self-pay

## 2022-02-18 DIAGNOSIS — F411 Generalized anxiety disorder: Secondary | ICD-10-CM

## 2022-02-18 DIAGNOSIS — E119 Type 2 diabetes mellitus without complications: Secondary | ICD-10-CM

## 2022-02-18 DIAGNOSIS — F331 Major depressive disorder, recurrent, moderate: Secondary | ICD-10-CM

## 2022-02-19 ENCOUNTER — Other Ambulatory Visit (HOSPITAL_COMMUNITY): Payer: Self-pay

## 2022-02-22 ENCOUNTER — Ambulatory Visit: Payer: 59 | Admitting: Behavioral Health

## 2022-02-22 ENCOUNTER — Encounter: Payer: Self-pay | Admitting: Behavioral Health

## 2022-02-22 ENCOUNTER — Other Ambulatory Visit (HOSPITAL_COMMUNITY): Payer: Self-pay

## 2022-02-22 DIAGNOSIS — Z638 Other specified problems related to primary support group: Secondary | ICD-10-CM

## 2022-02-22 DIAGNOSIS — F411 Generalized anxiety disorder: Secondary | ICD-10-CM | POA: Diagnosis not present

## 2022-02-22 DIAGNOSIS — F331 Major depressive disorder, recurrent, moderate: Secondary | ICD-10-CM | POA: Diagnosis not present

## 2022-02-22 DIAGNOSIS — F4323 Adjustment disorder with mixed anxiety and depressed mood: Secondary | ICD-10-CM | POA: Diagnosis not present

## 2022-02-22 DIAGNOSIS — F3289 Other specified depressive episodes: Secondary | ICD-10-CM

## 2022-02-22 MED ORDER — SERTRALINE HCL 50 MG PO TABS: 75.0000 mg | ORAL_TABLET | Freq: Every day | ORAL | 3 refills | Status: DC

## 2022-02-22 MED ORDER — BUPROPION HCL ER (SR) 200 MG PO TB12: 200.0000 mg | ORAL_TABLET | Freq: Two times a day (BID) | ORAL | 3 refills | Status: DC

## 2022-02-22 NOTE — Progress Notes (Signed)
[facility] Med Check ? ?[name],  ?MRN: [id number] ? ?PCP: Cari Caraway, MD ? ?Date of Evaluation: 02/22/2022 ?Time spent:30 minutes ? ?Chief Complaint:  ?Chief Complaint   ?Depression; Follow-up; Anxiety; Caregiver Role Strain; Medication Refill ?  ? ? ?HISTORY/CURRENT STATUS: ?HPI ? ?56 year old male presents to this office for follow up and medication management. He is very pleased with how the Zoloft is working so far. He says that he feels much calmer and things do not seem to bother him as much. Says the small increase in his Zoloft last visit has improved his anxiety and depression even more. Says his wife has made small improvements. She is driving now. He says he is trying to convince her to seek therapy. He reports his anxiety today at 3/10  and depression at 2/10. Says he is sleeping 7-8 hours nightly. Denies mania, no psychosis. No SI/HI.  ?  ?No Past medication failures ? ?Individual Medical History/ Review of Systems: Changes? :No  ? ?Allergies: Iohexol ? ?Current Medications:  ?Current Outpatient Medications:  ?  benazepril-hydrochlorthiazide (LOTENSIN HCT) 10-12.5 MG tablet, Take 1 tablet by mouth 2 (two) times daily., Disp: 60 tablet, Rfl: 0 ?  buPROPion (WELLBUTRIN SR) 200 MG 12 hr tablet, Take 1 tablet (200 mg total) by mouth 2 (two) times daily., Disp: 60 tablet, Rfl: 3 ?  fexofenadine (ALLEGRA) 180 MG tablet, Take 180 mg by mouth daily., Disp: , Rfl:  ?  pregabalin (LYRICA) 150 MG capsule, Take 150 mg by mouth 2 (two) times daily., Disp: , Rfl:  ?  sertraline (ZOLOFT) 50 MG tablet, Take 1.5 tablets (75 mg total) by mouth daily., Disp: 45 tablet, Rfl: 3 ?  tirzepatide (MOUNJARO) 7.5 MG/0.5ML Pen, Inject 1 pen (7.5 mg) into the skin once a week., Disp: 2 mL, Rfl: 0 ?  Travoprost, BAK Free, (TRAVATAN) 0.004 % SOLN ophthalmic solution, Place 1 drop into both eyes at bedtime., Disp: , Rfl:  ?  Vitamin D, Ergocalciferol, (DRISDOL) 1.25 MG (50000 UNIT) CAPS capsule, Take 1  capsule by mouth every 7 days., Disp: 4 capsule, Rfl: 0 ?Medication Side Effects: none ? ?Family Medical/ Social History: Changes? No ? ?MENTAL HEALTH EXAM: ? ?There were no vitals taken for this visit.There is no height or weight on file to calculate BMI.  ?General Appearance: Casual, Neat, and Well Groomed  ?Eye Contact:  Good  ?Speech:  Clear and Coherent  ?Volume:  Normal  ?Mood:  NA  ?Affect:  Appropriate  ?Thought Process:  Coherent  ?Orientation:  Full (Time, Place, and Person)  ?Thought Content: Logical   ?Suicidal Thoughts:  No  ?Homicidal Thoughts:  No  ?Memory:  WNL  ?Judgement:  Good  ?Insight:  Good  ?Psychomotor Activity:  Normal  ?Concentration:  Concentration: Good  ?Recall:  Good  ?Fund of Knowledge: Good  ?Language: Good  ?Assets:  Desire for Improvement  ?ADL's:  Intact  ?Cognition: WNL  ?Prognosis:  Good  ? ? ?DIAGNOSES:  ?  ICD-10-CM   ?1. Adjustment disorder with mixed anxiety and depressed mood  F43.23   ?  ?2. Generalized anxiety disorder  F41.1 sertraline (ZOLOFT) 50 MG tablet  ?  ?3. Major depressive disorder, recurrent episode, moderate (HCC)  F33.1 sertraline (ZOLOFT) 50 MG tablet  ?  ?4. Caregiver role strain  Z63.8   ?  ?5. Other depression with emotional eating  F32.89 buPROPion (WELLBUTRIN SR) 200 MG 12 hr tablet  ?  ? ? ?Receiving Psychotherapy: No  ? ? ?  RECOMMENDATIONS:  ? ?Greater than 50% of  30  min face to face time with patient was spent on counseling and coordination of care. We discussed his significant improvement with depression and anxiety. 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 We discussed the following plan: ?Continue Zoloft to 75 mg daily ?Continue Wellbutrin 200 mg SR two times daily ?To report side effects or worsening symptoms promptly ?Provided emergency contact information ?To follow up in 3 months to reassess. ?Reviewed PDMP ? ? ? ? ? ?Elwanda Brooklyn,  NP  ?

## 2022-02-23 ENCOUNTER — Other Ambulatory Visit (HOSPITAL_COMMUNITY): Payer: Self-pay

## 2022-02-23 ENCOUNTER — Other Ambulatory Visit (INDEPENDENT_AMBULATORY_CARE_PROVIDER_SITE_OTHER): Payer: Self-pay | Admitting: Family Medicine

## 2022-02-23 DIAGNOSIS — E119 Type 2 diabetes mellitus without complications: Secondary | ICD-10-CM

## 2022-03-01 ENCOUNTER — Other Ambulatory Visit (HOSPITAL_COMMUNITY): Payer: Self-pay

## 2022-03-03 ENCOUNTER — Ambulatory Visit (INDEPENDENT_AMBULATORY_CARE_PROVIDER_SITE_OTHER): Payer: 59 | Admitting: Psychology

## 2022-03-03 DIAGNOSIS — F4323 Adjustment disorder with mixed anxiety and depressed mood: Secondary | ICD-10-CM | POA: Diagnosis not present

## 2022-03-03 NOTE — Progress Notes (Signed)
[place] Counselor/Therapist Progress Note ? ?[name], MRN: [id number],   ? ?Date: 03/03/2022 ? ?Time Spent: 50 mins ? ?Treatment Type: Individual Therapy ? ?Reported Symptoms: Pt presented via webex video for follow up session, granting consent for the session.  Pt stated he was in his car and no one else was present.  I shared with pt that I am in my office and no one else is present. ? ?Mental Status Exam: ?Appearance:  Casual     ?Behavior: Appropriate  ?Motor: Normal  ?Speech/Language:  Clear and Coherent  ?Affect: Appropriate  ?Mood: normal  ?Thought process: normal  ?Thought content:   WNL  ?Sensory/Perceptual disturbances:   WNL  ?Orientation: oriented to person, place, and time/date  ?Attention: Good  ?Concentration: Good  ?Memory: WNL  ?Fund of knowledge:  Good  ?Insight:   Good  ?Judgment:  Good  ?Impulse Control: Good  ? ?Risk Assessment: ?Danger to Self:  No ?Self-injurious Behavior: No ?Danger to Others: No ?Duty to Warn:no ?Physical Aggression / Violence:No  ?Access to Firearms a concern: No  ?Gang Involvement:No  ? ?Subjective: Pt shares that "everything is about the same.  [name] is getting a little better; she is driving again and is able to take herself to some of her appts."  She has been driving for the past week.  Pt has talked with [name] about her seeing a counselor and she is open to that option.  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  His boss has talked with pt about a job that will becoming open due to a retirement that is upcoming.  [name] continues to see a chiropractor for her back and PT for her elbow and is getting better with each.  Talked again with pt about his eating cookies or other sugar items.  He feels like he is better off staying away from cookies all together.  He has a similar attitude towards alcohol.  Pt shares that abstinence is better for him and abstinence is hard.  Pt has  found that he has been hiding his cookies and alcohol from [place].  Talked with pt about looking for a copy of the AA book; asked pt to think about doing so and beginning to read it.  He is afraid that [name] might find it.  We will talk more about these topics in our follow up session in 4 wks. ? ?Interventions: Cognitive Behavioral Therapy ? ?Diagnosis:Adjustment disorder with mixed anxiety and depressed mood ? ?Plan: Treatment Plan ?Strengths/Abilities:  Intelligent, Intuitive, Willing to participate in therapy ?Treatment Preferences:  Outpatient Individual Therapy ?Statement of Needs:  Patient is to use CBT, mindfulness and coping skills to help manage and/or decrease symptoms associated with their diagnosis. ?Symptoms:  Depressed/Irritable mood, worry, social withdrawal ?Problems Addressed:  Depressive thoughts, Sadness, Sleep issues, etc. ?Long Term Goals:  Pt to reduce overall level, frequency, and intensity of the feelings of depression as evidenced by decreased irritability, negative self talk, and helpless feelings from 6 to 7 days/week to 0 to 1 days/week, per client report, for at least 3 consecutive months.  Progress: 20% ?Short Term Goals:  Pt to verbally express understanding of the relationship between feelings of depression and their impact on thinking patterns and behaviors.  Pt to verbalize an understanding of the role that distorted thinking plays in creating fears, excessive worry, and ruminations.  Progress: 20% ?Target Date:  11/17/2022 ?Frequency:  Bi-weekly ?Modality:  Cognitive Behavioral Therapy ?Interventions by Therapist:  Therapist will use CBT, Mindfulness exercises, Coping skills and Referrals, as needed by client. ?Client has verbally approved this treatment plan. ? ?Ivan Anchors, [facility] ?

## 2022-03-09 ENCOUNTER — Ambulatory Visit (INDEPENDENT_AMBULATORY_CARE_PROVIDER_SITE_OTHER): Payer: 59 | Admitting: Family Medicine

## 2022-03-15 ENCOUNTER — Ambulatory Visit (INDEPENDENT_AMBULATORY_CARE_PROVIDER_SITE_OTHER): Payer: 59 | Admitting: Family Medicine

## 2022-03-15 ENCOUNTER — Other Ambulatory Visit (HOSPITAL_COMMUNITY): Payer: Self-pay

## 2022-03-15 VITALS — BP 117/74 | HR 63 | Temp 97.7°F | Ht 69.0 in | Wt 225.0 lb

## 2022-03-15 DIAGNOSIS — Z9189 Other specified personal risk factors, not elsewhere classified: Secondary | ICD-10-CM

## 2022-03-15 DIAGNOSIS — E669 Obesity, unspecified: Secondary | ICD-10-CM | POA: Diagnosis not present

## 2022-03-15 DIAGNOSIS — E559 Vitamin D deficiency, unspecified: Secondary | ICD-10-CM | POA: Diagnosis not present

## 2022-03-15 DIAGNOSIS — E66813 Obesity, class 3: Secondary | ICD-10-CM

## 2022-03-15 DIAGNOSIS — F3289 Other specified depressive episodes: Secondary | ICD-10-CM

## 2022-03-15 DIAGNOSIS — Z6834 Body mass index (BMI) 34.0-34.9, adult: Secondary | ICD-10-CM

## 2022-03-15 DIAGNOSIS — E119 Type 2 diabetes mellitus without complications: Secondary | ICD-10-CM | POA: Diagnosis not present

## 2022-03-15 DIAGNOSIS — Z7985 Long-term (current) use of injectable non-insulin antidiabetic drugs: Secondary | ICD-10-CM

## 2022-03-15 MED ORDER — BUPROPION HCL ER (SR) 200 MG PO TB12
200.0000 mg | ORAL_TABLET | Freq: Two times a day (BID) | ORAL | 0 refills | Status: DC
  Filled 2022-03-15: qty 60, 30d supply, fill #0

## 2022-03-15 MED ORDER — VITAMIN D (ERGOCALCIFEROL) 1.25 MG (50000 UNIT) PO CAPS
50000.0000 [IU] | ORAL_CAPSULE | ORAL | 0 refills | Status: DC
  Filled 2022-03-15: qty 4, 28d supply, fill #0

## 2022-03-15 MED ORDER — TIRZEPATIDE 5 MG/0.5ML ~~LOC~~ SOAJ
5.0000 mg | SUBCUTANEOUS | 0 refills | Status: DC
  Filled 2022-03-15: qty 2, 28d supply, fill #0

## 2022-03-17 ENCOUNTER — Other Ambulatory Visit (HOSPITAL_COMMUNITY): Payer: Self-pay

## 2022-03-18 ENCOUNTER — Other Ambulatory Visit (HOSPITAL_COMMUNITY): Payer: Self-pay

## 2022-03-25 NOTE — Progress Notes (Signed)
? ? ? ?Chief Complaint:  ? ?OBESITY ?[name] is here to discuss his progress with his obesity treatment plan along with follow-up of his obesity related diagnoses. [name] is on keeping a food journal and adhering to recommended goals of 1500-1600 calories and 100+ grams of protein daily and states he is following his eating plan approximately 85% of the time. [name] states he is walking for 1.5 hours 5 time per week.  ? ?Today's visit was #: 46 ?Starting weight: 308 lbs ?Starting date: 04/10/2019 ?Today's weight: 235 lbs ?Today's date: 03/15/2022 ?Total lbs lost to date: 68 ?Total lbs lost since last in-office visit: 5 ? ?Interim History: [name] continues to do well with weight loss. His hunger is mostly controlled and he is working on increasing walking and gardening.  ? ?Subjective:  ? ?1. Controlled type 2 diabetes mellitus without complication, without long-term current use of insulin ([place]) ?Linkyn's Mounjaro was increased but the pharmacy was out. He would like a 5 mg script until he can get the 7.5 mg dose.  ? ?2. Vitamin D deficiency ?[name] has a history of Vitamin D deficiency and he has came close to being overreplaced. ? ?3. Other depression with emotional eating ?[name] is currently stable on his medications. ? ?4. At risk for heart disease ?[name] is at higher than average risk for cardiovascular disease due to obesity. ? ?Assessment/Plan:  ? ?1. Controlled type 2 diabetes mellitus without complication, without long-term current use of insulin ([place]) ?[name] will continue Mounjaro at 5 mg q week, and we will refill for 1 month. ? ?- tirzepatide (MOUNJARO) 5 MG/0.5ML Pen; Inject 5 mg into the skin once a week.  Dispense: 6 mL; Refill: 0 ? ?2. Vitamin D deficiency ?We will check labs today, and we will refill prescription Vitamin D for 1 month. ? ?- VITAMIN D 25 Hydroxy (Vit-D Deficiency, Fractures) ?- Vitamin D, Ergocalciferol, (DRISDOL) 1.25 MG (50000 UNIT) CAPS capsule; Take 1 capsule by mouth  every 7 days.  Dispense: 4 capsule; Refill: 0 ? ?3. Other depression with emotional eating ?Mustapha will continue Wellbutrin SR 200 mg BID and we will refill for 1 month. ? ?- buPROPion (WELLBUTRIN SR) 200 MG 12 hr tablet; Take 1 tablet (200 mg total) by mouth 2 (two) times daily.  Dispense: 60 tablet; Refill: 0 ? ?4. At risk for heart disease ?[name] was given approximately 15 minutes of coronary artery disease prevention counseling today. He is 56 y.o. male and has risk factors for heart disease including obesity. We discussed intensive lifestyle modifications today with an emphasis on specific weight loss instructions and strategies. ? ?Repetitive spaced learning was employed today to elicit superior memory formation and behavioral change.  ? ?5. Obesity with current BMI of 34.8 ?[name] is currently in the action stage of change. As such, his goal is to continue with weight loss efforts. He has agreed to the Category 4 Plan.  ? ?Exercise goals: As is. ? ?Behavioral modification strategies: increasing lean protein intake and keeping healthy foods in the home. ? ?[name] has agreed to follow-up with our clinic in 6 weeks. He was informed of the importance of frequent follow-up visits to maximize his success with intensive lifestyle modifications for his multiple health conditions.  ? ?[name] was informed we would discuss his lab results at his next visit unless there is a critical issue that needs to be addressed sooner. [name] agreed to keep his next visit at the agreed upon time to discuss these results. ? ?Objective:  ? ?  Blood pressure 117/74, pulse 63, temperature 97.7 ?F (36.5 ?C), temperature source Oral, height '5\' 9"'$  (1.753 m), weight 225 lb (102.1 kg), SpO2 99 %. ?Body mass index is 33.23 kg/m?. ? ?General: Cooperative, alert, well developed, in no acute distress. ?HEENT: Conjunctivae and lids unremarkable. ?Cardiovascular: Regular rhythm.  ?Lungs: Normal work of breathing. ?Neurologic: No focal  deficits.  ? ?Lab Results  ?Component Value Date  ? CREATININE 1.1 01/19/2022  ? BUN 19 01/19/2022  ? NA 134 (A) 01/19/2022  ? K 3.9 01/19/2022  ? CL 99 01/19/2022  ? CO2 26 (A) 01/19/2022  ? ?Lab Results  ?Component Value Date  ? ALT 22 01/19/2022  ? AST 20 01/19/2022  ? ALKPHOS 69 01/19/2022  ? BILITOT 0.5 11/11/2021  ? ?Lab Results  ?Component Value Date  ? HGBA1C 5.4 01/19/2022  ? HGBA1C 5.4 11/11/2021  ? HGBA1C 5.9 (H) 01/15/2021  ? HGBA1C 5.8 (H) 07/23/2020  ? HGBA1C 5.6 12/19/2019  ? ?Lab Results  ?Component Value Date  ? INSULIN 20.5 11/11/2021  ? INSULIN 12.8 01/15/2021  ? INSULIN 18.1 07/23/2020  ? INSULIN 27.0 (H) 12/19/2019  ? INSULIN 24.2 09/03/2019  ? ?Lab Results  ?Component Value Date  ? TSH 1.340 04/10/2019  ? ?Lab Results  ?Component Value Date  ? CHOL 182 11/11/2021  ? HDL 47 01/19/2022  ? [place] 93 01/19/2022  ? TRIG 166 (A) 01/19/2022  ? ?Lab Results  ?Component Value Date  ? VD25OH 34.5 11/11/2021  ? VD25OH 35.0 05/11/2021  ? VD25OH 34.3 01/15/2021  ? ?Lab Results  ?Component Value Date  ? WBC 6.0 04/10/2019  ? HGB 13.4 04/10/2019  ? HCT 39.2 04/10/2019  ? MCV 82 04/10/2019  ? PLT 235 06/15/2016  ? ?No results found for: IRON, TIBC, FERRITIN ? ?Attestation Statements:  ? ?Reviewed by clinician on day of visit: allergies, medications, problem list, medical history, surgical history, family history, social history, and previous encounter notes. ? ? ?I, Trixie Dredge, am acting as transcriptionist for Dennard Nip, MD. ? ?I have reviewed the above documentation for accuracy and completeness, and I agree with the above. -  Dennard Nip, MD ? ? ?

## 2022-04-14 ENCOUNTER — Ambulatory Visit (INDEPENDENT_AMBULATORY_CARE_PROVIDER_SITE_OTHER): Payer: 59 | Admitting: Psychology

## 2022-04-14 DIAGNOSIS — F4323 Adjustment disorder with mixed anxiety and depressed mood: Secondary | ICD-10-CM

## 2022-04-14 NOTE — Progress Notes (Signed)
[facility] Counselor/Therapist Progress Note  [name], MRN: [id number],    Date: 04/14/2022  Time Spent: 50 mins  Treatment Type: Individual Therapy  Reported Symptoms: Pt presented via webex video for follow up session, granting consent for the session.  Pt stated he was in his home and no one else was present.  I shared with pt that I am in my office and no one else is present.  Mental Status Exam: Appearance:  Casual     Behavior: Appropriate  Motor: Normal  Speech/Language:  Clear and Coherent  Affect: Appropriate  Mood: normal  Thought process: normal  Thought content:   WNL  Sensory/Perceptual disturbances:   WNL  Orientation: oriented to person, place, and time/date  Attention: Good  Concentration: Good  Memory: WNL  Fund of knowledge:  Good  Insight:   Good  Judgment:  Good  Impulse Control: Good   Risk Assessment: Danger to Self:  No Self-injurious Behavior: No Danger to Others: No Duty to Warn:no Physical Aggression / Violence:No  Access to Firearms a concern: No  Gang Involvement:No   Subjective: Pt shares that "he has not been too good" since our last session, 6 wks ago.  Pt shares that [name] is still having issues, this time with her wrists and hands.  She has been seen by doctors again and pt shares it is frustrating for him.  [name] claims she is not able to use her wrists and hands without pain; she does have arthritis in her joints.  Pt is somewhat isolated; he can talk to his daughter about his frustrations.  He advises that there are some changes going on at [employer] and that is leading to changes for the company.  Pt shares that [name] no longer drives herself to appts; she states that he pain is too much to drive.  Pt shares that he attended a re-enactment at [facility] in early May; he enjoyed the event; there were about [phone] re-enactors that participated.  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  She is expected to live only another couple of weeks.  The company commander Clair Gulling) was treated for pancreatic cancer in the past and was clear, but it has returned and he starts chemo tomorrow.  We talked about how pt has been feeling about [name]'s health issues and how they impact him.  Pt has been gardening and caring for the yards of his, his mom's and his daughter's.  His daughter Joellen [place]) is planning to leave his boyfriend as soon as school is out.  They have lived together for 10 yrs; he is still married to his estranged wife and has never divorced her.  He is entitled and likes to be cared for.  The guy has a 63 yo daughter who also lives with them.  They rent the home from pt's aunt.  Pt shares he is somewhat afraid for his daughter but will see that she is safe.  Pt shares he is having frustration, anger, and sadness over all of these situations.  Pt did buy a copy of the AA book and he is beginning to read it.  He is interested in what he is reading so far.  Encouraged pt to continue reading the AA book and to try to do something everyday that is good for him.  We will meet in 2 wks for a follow up session.  Interventions: Cognitive  Behavioral Therapy  Diagnosis:Adjustment disorder with mixed anxiety and depressed mood  Plan: Treatment Plan Strengths/Abilities:  Intelligent, Intuitive, Willing to participate in therapy Treatment Preferences:  Outpatient Individual Therapy Statement of Needs:  Patient is to use CBT, mindfulness and coping skills to help manage and/or decrease symptoms associated with their diagnosis. Symptoms:  Depressed/Irritable mood, worry, social withdrawal Problems Addressed:  Depressive thoughts, Sadness, Sleep issues, etc. Long Term Goals:  Pt to reduce overall level, frequency, and intensity of the feelings of depression as evidenced by decreased irritability, negative self talk, and helpless feelings from 6 to 7 days/week to 0 to 1  days/week, per client report, for at least 3 consecutive months.  Progress: 20% Short Term Goals:  Pt to verbally express understanding of the relationship between feelings of depression and their impact on thinking patterns and behaviors.  Pt to verbalize an understanding of the role that distorted thinking plays in creating fears, excessive worry, and ruminations.  Progress: 20% Target Date:  11/17/2022 Frequency:  Bi-weekly Modality:  Cognitive Behavioral Therapy Interventions by Therapist:  Therapist will use CBT, Mindfulness exercises, Coping skills and Referrals, as needed by client. Client has verbally approved this treatment plan.  Ivan Anchors, [facility]

## 2022-04-26 ENCOUNTER — Ambulatory Visit (INDEPENDENT_AMBULATORY_CARE_PROVIDER_SITE_OTHER): Payer: 59 | Admitting: Family Medicine

## 2022-04-26 ENCOUNTER — Encounter (INDEPENDENT_AMBULATORY_CARE_PROVIDER_SITE_OTHER): Payer: Self-pay | Admitting: Family Medicine

## 2022-04-26 ENCOUNTER — Other Ambulatory Visit (HOSPITAL_COMMUNITY): Payer: Self-pay

## 2022-04-26 VITALS — BP 119/73 | HR 65 | Temp 97.6°F | Ht 69.0 in | Wt 243.0 lb

## 2022-04-26 DIAGNOSIS — E785 Hyperlipidemia, unspecified: Secondary | ICD-10-CM

## 2022-04-26 DIAGNOSIS — F3289 Other specified depressive episodes: Secondary | ICD-10-CM | POA: Diagnosis not present

## 2022-04-26 DIAGNOSIS — E669 Obesity, unspecified: Secondary | ICD-10-CM

## 2022-04-26 DIAGNOSIS — E559 Vitamin D deficiency, unspecified: Secondary | ICD-10-CM | POA: Diagnosis not present

## 2022-04-26 DIAGNOSIS — Z9189 Other specified personal risk factors, not elsewhere classified: Secondary | ICD-10-CM

## 2022-04-26 DIAGNOSIS — E119 Type 2 diabetes mellitus without complications: Secondary | ICD-10-CM | POA: Diagnosis not present

## 2022-04-26 DIAGNOSIS — Z7985 Long-term (current) use of injectable non-insulin antidiabetic drugs: Secondary | ICD-10-CM

## 2022-04-26 DIAGNOSIS — Z6836 Body mass index (BMI) 36.0-36.9, adult: Secondary | ICD-10-CM

## 2022-04-26 MED ORDER — TIRZEPATIDE 5 MG/0.5ML ~~LOC~~ SOAJ
5.0000 mg | SUBCUTANEOUS | 0 refills | Status: DC
  Filled 2022-04-26: qty 2, 28d supply, fill #0
  Filled 2022-05-24: qty 2, 28d supply, fill #1

## 2022-04-26 MED ORDER — VITAMIN D (ERGOCALCIFEROL) 1.25 MG (50000 UNIT) PO CAPS
50000.0000 [IU] | ORAL_CAPSULE | ORAL | 0 refills | Status: DC
  Filled 2022-04-26: qty 4, 28d supply, fill #0

## 2022-04-26 MED ORDER — BUPROPION HCL ER (SR) 200 MG PO TB12
200.0000 mg | ORAL_TABLET | Freq: Two times a day (BID) | ORAL | 0 refills | Status: DC
  Filled 2022-04-26: qty 60, 30d supply, fill #0

## 2022-04-27 LAB — CBC WITH DIFFERENTIAL/PLATELET
Basophils Absolute: 0.1 10*3/uL (ref 0.0–0.2)
Basos: 1 %
EOS (ABSOLUTE): 0.4 10*3/uL (ref 0.0–0.4)
Eos: 8 %
Hematocrit: 42.7 % (ref 37.5–51.0)
Hemoglobin: 13.8 g/dL (ref 13.0–17.7)
Immature Grans (Abs): 0 10*3/uL (ref 0.0–0.1)
Immature Granulocytes: 0 %
Lymphocytes Absolute: 1.4 10*3/uL (ref 0.7–3.1)
Lymphs: 29 %
MCH: 27.1 pg (ref 26.6–33.0)
MCHC: 32.3 g/dL (ref 31.5–35.7)
MCV: 84 fL (ref 79–97)
Monocytes Absolute: 0.5 10*3/uL (ref 0.1–0.9)
Monocytes: 10 %
Neutrophils Absolute: 2.3 10*3/uL (ref 1.4–7.0)
Neutrophils: 52 %
Platelets: 223 10*3/uL (ref 150–450)
RBC: 5.09 x10E6/uL (ref 4.14–5.80)
RDW: 13.7 % (ref 11.6–15.4)
WBC: 4.6 10*3/uL (ref 3.4–10.8)

## 2022-04-27 LAB — CMP14+EGFR
ALT: 31 IU/L (ref 0–44)
AST: 23 IU/L (ref 0–40)
Albumin/Globulin Ratio: 2 (ref 1.2–2.2)
Albumin: 4.7 g/dL (ref 3.8–4.9)
Alkaline Phosphatase: 64 IU/L (ref 44–121)
BUN/Creatinine Ratio: 17 (ref 9–20)
BUN: 17 mg/dL (ref 6–24)
Bilirubin Total: 0.3 mg/dL (ref 0.0–1.2)
CO2: 23 mmol/L (ref 20–29)
Calcium: 9.8 mg/dL (ref 8.7–10.2)
Chloride: 102 mmol/L (ref 96–106)
Creatinine, Ser: 0.98 mg/dL (ref 0.76–1.27)
Globulin, Total: 2.3 g/dL (ref 1.5–4.5)
Glucose: 98 mg/dL (ref 70–99)
Potassium: 3.4 mmol/L — ABNORMAL LOW (ref 3.5–5.2)
Sodium: 138 mmol/L (ref 134–144)
Total Protein: 7 g/dL (ref 6.0–8.5)
eGFR: 90 mL/min/{1.73_m2} (ref >59)

## 2022-04-27 LAB — LIPID PANEL WITH LDL/HDL RATIO
Cholesterol, Total: 193 mg/dL (ref 100–199)
HDL: 58 mg/dL (ref >39)
LDL Chol Calc (NIH): 116 mg/dL — ABNORMAL HIGH (ref 0–99)
LDL/HDL Ratio: 2 ratio (ref 0.0–3.6)
Triglycerides: 106 mg/dL (ref 0–149)
VLDL Cholesterol Cal: 19 mg/dL (ref 5–40)

## 2022-04-27 LAB — INSULIN, RANDOM: INSULIN: 23.5 u[IU]/mL (ref 2.6–24.9)

## 2022-04-27 LAB — HEMOGLOBIN A1C
Est. average glucose Bld gHb Est-mCnc: 108 mg/dL
Hgb A1c MFr Bld: 5.4 % (ref 4.8–5.6)

## 2022-04-27 LAB — VITAMIN D 25 HYDROXY (VIT D DEFICIENCY, FRACTURES): Vit D, 25-Hydroxy: 46.4 ng/mL (ref 30.0–100.0)

## 2022-04-28 ENCOUNTER — Telehealth (INDEPENDENT_AMBULATORY_CARE_PROVIDER_SITE_OTHER): Payer: Self-pay | Admitting: Family Medicine

## 2022-04-28 ENCOUNTER — Encounter (INDEPENDENT_AMBULATORY_CARE_PROVIDER_SITE_OTHER): Payer: Self-pay

## 2022-04-28 NOTE — Progress Notes (Signed)
Chief Complaint:   OBESITY Kenneth Green is here to discuss his progress with his obesity treatment plan along with follow-up of his obesity related diagnoses. Kenneth Green is on the Category 4 Plan and states he is following his eating plan approximately 80% of the time. Kenneth Green states he is walking and gardening for 60 minutes 5 times per week.  Today's visit was #: 74 Starting weight: 308 lbs Starting date: 04/10/2019 Today's weight: 243 lbs Today's date: 04/26/2022 Total lbs lost to date: 65 Total lbs lost since last in-office visit: 0  Interim History: Kenneth Green has been dealing with family and work stress as well as 2 friends poor health and recent death. He notes increased stress eating and feeling guilty about getting off track. He is ready to get back on track with his eating plan.   Subjective:   1. Hyperlipidemia, unspecified hyperlipidemia type Kenneth Green is working on decreasing cholesterol in his diet. He is not on statin and he is due for labs.   2. Vitamin D deficiency Kenneth Green is on Vitamin D, and his last level was not at goal. He is due for labs.   3. Controlled type 2 diabetes mellitus without complication, without long-term current use of insulin (Kenneth Green) Kenneth Green notes increased simple carbohydrates recently, and having increased GI bloating due to this. He is working on getting back on track.   4. Other depression with emotional eating Kenneth Green is stable on Wellbutrin. He has been dealing with family and work stress recently.   5. At risk for heart disease Kenneth Green is at higher than average risk for cardiovascular disease due to obesity.  Assessment/Plan:   1. Hyperlipidemia, unspecified hyperlipidemia type Cardiovascular risk and specific lipid/LDL goals reviewed.  We discussed several lifestyle modifications today. We will check labs today. Kenneth Green will continue to work on diet, exercise and weight loss efforts. Orders and follow up as documented in patient record.   -  Lipid Panel With LDL/HDL Ratio  2. Vitamin D deficiency We will check labs today, and we will refill prescription Vitamin D for 1 month. Kenneth Green will follow-up for routine testing of Vitamin D, at least 2-3 times per year to avoid over-replacement.  - Vitamin D, Ergocalciferol, (DRISDOL) 1.25 MG (50000 UNIT) CAPS capsule; Take 1 capsule by mouth every 7 days.  Dispense: 4 capsule; Refill: 0 - VITAMIN D 25 Hydroxy (Vit-D Deficiency, Fractures)  3. Controlled type 2 diabetes mellitus without complication, without long-term current use of insulin (Kenneth Green) We will check labs today, and we will refill Mounjaro for 1 month. Kenneth Green will continue with his diet and exercise. Good blood sugar control is important to decrease the likelihood of diabetic complications such as nephropathy, neuropathy, limb loss, blindness, coronary artery disease, and death. Intensive lifestyle modification including diet, exercise and weight loss are the first line of treatment for diabetes.   - tirzepatide [facility]) 5 MG/0.5ML Pen; Inject 5 mg into the skin once a week.  Dispense: 6 mL; Refill: 0 - CMP14+EGFR - CBC with Differential/Platelet - Insulin, random - Hemoglobin A1c  4. Other depression with emotional eating We will refill Wellbutrin SR for 1 month. Behavior modification techniques were discussed today to help Kenneth Green deal with his emotional/non-hunger eating behaviors.  Orders and follow up as documented in patient record.   - buPROPion (WELLBUTRIN SR) 200 MG 12 hr tablet; Take 1 tablet (200 mg total) by mouth 2 (two) times daily.  Dispense: 60 tablet; Refill: 0  5. At risk for heart disease Kenneth Green  was given approximately 15 minutes of coronary artery disease prevention counseling today. He is 56 y.o. male and has risk factors for heart disease including obesity. We discussed intensive lifestyle modifications today with an emphasis on specific weight loss instructions and strategies.  Repetitive spaced  learning was employed today to elicit superior memory formation and behavioral change.   6. Obesity with current BMI of 36.0 Kenneth Green is currently in the action stage of change. As such, his goal is to continue with weight loss efforts. He has agreed to the Category 4 Plan.   Exercise goals: As is.  Behavioral modification strategies: no skipping meals and emotional eating strategies.  Kenneth Green has agreed to follow-up with our clinic in 4 weeks. He was informed of the importance of frequent follow-up visits to maximize his success with intensive lifestyle modifications for his multiple health conditions.   Kenneth Green was informed we would discuss his lab results at his next visit unless there is a critical issue that needs to be addressed sooner. Kenneth Green agreed to keep his next visit at the agreed upon time to discuss these results.  Objective:   Blood pressure 119/73, pulse 65, temperature 97.6 F (36.4 C), height 5' 9"  (1.753 m), weight 243 lb (110.2 kg), SpO2 99 %. Body mass index is 35.88 kg/m.  General: Cooperative, alert, well developed, in no acute distress. HEENT: Conjunctivae and lids unremarkable. Cardiovascular: Regular rhythm.  Lungs: Normal work of breathing. Neurologic: No focal deficits.   Lab Results  Component Value Date   CREATININE 0.98 04/26/2022   BUN 17 04/26/2022   NA 138 04/26/2022   K 3.4 (L) 04/26/2022   CL 102 04/26/2022   CO2 23 04/26/2022   Lab Results  Component Value Date   ALT 31 04/26/2022   AST 23 04/26/2022   ALKPHOS 64 04/26/2022   BILITOT 0.3 04/26/2022   Lab Results  Component Value Date   HGBA1C 5.4 04/26/2022   HGBA1C 5.4 01/19/2022   HGBA1C 5.4 11/11/2021   HGBA1C 5.9 (H) 01/15/2021   HGBA1C 5.8 (H) 07/23/2020   Lab Results  Component Value Date   INSULIN 23.5 04/26/2022   INSULIN 20.5 11/11/2021   INSULIN 12.8 01/15/2021   INSULIN 18.1 07/23/2020   INSULIN 27.0 (H) 12/19/2019   Lab Results  Component Value Date   TSH  1.340 04/10/2019   Lab Results  Component Value Date   CHOL 193 04/26/2022   HDL 58 04/26/2022   LDLCALC 116 (H) 04/26/2022   TRIG 106 04/26/2022   Lab Results  Component Value Date   VD25OH 46.4 04/26/2022   VD25OH 34.5 11/11/2021   VD25OH 35.0 05/11/2021   Lab Results  Component Value Date   WBC 4.6 04/26/2022   HGB 13.8 04/26/2022   HCT 42.7 04/26/2022   MCV 84 04/26/2022   PLT 223 04/26/2022   No results found for: IRON, TIBC, FERRITIN  Attestation Statements:   Reviewed by clinician on day of visit: allergies, medications, problem list, medical history, surgical history, family history, social history, and previous encounter notes.   I, Kenneth Green, am acting as transcriptionist for Dennard Nip, MD.  I have reviewed the above documentation for accuracy and completeness, and I agree with the above. -  Dennard Nip, MD

## 2022-04-28 NOTE — Telephone Encounter (Signed)
Dr. Leafy Ro - Prior authorization denied for [facility]. Patient already uses copay card. Patient sent denial message via mychart.

## 2022-04-29 ENCOUNTER — Ambulatory Visit (INDEPENDENT_AMBULATORY_CARE_PROVIDER_SITE_OTHER): Payer: 59 | Admitting: Psychology

## 2022-04-29 ENCOUNTER — Other Ambulatory Visit (HOSPITAL_COMMUNITY): Payer: Self-pay

## 2022-04-29 DIAGNOSIS — F4323 Adjustment disorder with mixed anxiety and depressed mood: Secondary | ICD-10-CM | POA: Diagnosis not present

## 2022-04-29 NOTE — Progress Notes (Signed)
[facility] Counselor/Therapist Progress Note  [name], MRN: [id number],    Date: 04/29/2022  Time Spent: 50 mins  Treatment Type: Individual Therapy  Reported Symptoms: Pt presented via webex video for follow up session, granting consent for the session.  Pt stated he was in his home and no one else was present.  I shared with pt that I am in my office and no one else is present.  Mental Status Exam: Appearance:  Casual     Behavior: Appropriate  Motor: Normal  Speech/Language:  Clear and Coherent  Affect: Appropriate  Mood: normal  Thought process: normal  Thought content:   WNL  Sensory/Perceptual disturbances:   WNL  Orientation: oriented to person, place, and time/date  Attention: Good  Concentration: Good  Memory: WNL  Fund of knowledge:  Good  Insight:   Good  Judgment:  Good  Impulse Control: Good   Risk Assessment: Danger to Self:  No Self-injurious Behavior: No Danger to Others: No Duty to Warn:no Physical Aggression / Violence:No  Access to Firearms a concern: No  Gang Involvement:No   Subjective: Pt shares that "I have not been too great.  I have a friend in [place], [place] who has just been diagnosed again with pancreatic cancer.  He is now doing chemo again.  I have another friend there who was also being treated for cancer and she passed away the day we visited."  Pt also shares that work is being really busy; the position he wanted to apply for is being posted today and he plans to apply for it today.  He is sad about the passing of his friend and the illness of other friend; "I am stress eating again."  Pt shares, "I have zero motivation and zero desire to do anything.  There is so much to do" and pt has trouble keeping up.  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  Pt shares his lone self care activity has been mowing the grass;  he has not been as active as usual because of his lack of motivation.  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  Pt shares his sleep has not been great lately; he slept all day yesterday before getting ready to go back to work last night; that was an unusually long period of sleep for him.  Encouraged pt to continue reading the AA book and to try to do something everyday that is good for him.  We will meet in 2 wks for a follow up session.  Interventions: Cognitive Behavioral Therapy  Diagnosis:Adjustment disorder with mixed anxiety and depressed mood  Plan: Treatment Plan Strengths/Abilities:  Intelligent, Intuitive, Willing to participate in therapy Treatment Preferences:  Outpatient Individual Therapy Statement of Needs:  Patient is to use CBT, mindfulness and coping skills to help manage and/or decrease symptoms associated with their diagnosis. Symptoms:  Depressed/Irritable mood, worry, social withdrawal Problems Addressed:  Depressive thoughts, Sadness, Sleep issues, etc. Long Term Goals:  Pt to reduce overall level, frequency, and intensity of the feelings of depression as evidenced by decreased irritability, negative self talk, and helpless feelings from 6 to 7 days/week to 0 to 1 days/week, per client report, for at least 3 consecutive months.  Progress: 20%  Short Term Goals:  Pt to verbally express understanding of the relationship between feelings of depression and their impact on thinking patterns and behaviors.  Pt to verbalize an understanding of the role that distorted thinking plays in creating fears, excessive worry, and ruminations.  Progress: 20% Target Date:  11/17/2022 Frequency:  Bi-weekly Modality:  Cognitive Behavioral Therapy Interventions by Therapist:  Therapist will use CBT, Mindfulness exercises, Coping skills and Referrals, as needed by client. Client has  verbally approved this treatment plan.  Ivan Anchors, [facility]

## 2022-05-05 ENCOUNTER — Other Ambulatory Visit: Payer: Self-pay | Admitting: Behavioral Health

## 2022-05-05 DIAGNOSIS — F331 Major depressive disorder, recurrent, moderate: Secondary | ICD-10-CM

## 2022-05-05 DIAGNOSIS — F411 Generalized anxiety disorder: Secondary | ICD-10-CM

## 2022-05-06 ENCOUNTER — Other Ambulatory Visit: Payer: Self-pay | Admitting: Behavioral Health

## 2022-05-06 DIAGNOSIS — F331 Major depressive disorder, recurrent, moderate: Secondary | ICD-10-CM

## 2022-05-06 DIAGNOSIS — F411 Generalized anxiety disorder: Secondary | ICD-10-CM

## 2022-05-07 ENCOUNTER — Other Ambulatory Visit: Payer: Self-pay | Admitting: Behavioral Health

## 2022-05-07 DIAGNOSIS — F3289 Other specified depressive episodes: Secondary | ICD-10-CM

## 2022-05-12 ENCOUNTER — Ambulatory Visit (INDEPENDENT_AMBULATORY_CARE_PROVIDER_SITE_OTHER): Payer: 59 | Admitting: Psychology

## 2022-05-12 DIAGNOSIS — F4323 Adjustment disorder with mixed anxiety and depressed mood: Secondary | ICD-10-CM | POA: Diagnosis not present

## 2022-05-12 NOTE — Progress Notes (Signed)
[facility] Counselor/Therapist Progress Note  [name], MRN: [id number],    Date: 05/12/2022  Time Spent: 45 mins  Treatment Type: Individual Therapy  Reported Symptoms: Pt presented via webex video for follow up session, granting consent for the session.  Pt stated he was in his home and no one else was present.  I shared with pt that I am in my office and no one else is present.  Mental Status Exam: Appearance:  Casual     Behavior: Appropriate  Motor: Normal  Speech/Language:  Clear and Coherent  Affect: Appropriate  Mood: normal  Thought process: normal  Thought content:   WNL  Sensory/Perceptual disturbances:   WNL  Orientation: oriented to person, place, and time/date  Attention: Good  Concentration: Good  Memory: WNL  Fund of knowledge:  Good  Insight:   Good  Judgment:  Good  Impulse Control: Good   Risk Assessment: Danger to Self:  No Self-injurious Behavior: No Danger to Others: No Duty to Warn:no Physical Aggression / Violence:No  Access to Firearms a concern: No  Gang Involvement:No   Subjective: Pt shares that "I have been OK since our last session."  Pt seems to have more energy than he showed at our last session.  Pt shares his daughter left last week and her boyfriend went to [place] to visit family; he is supposed to return on Friday.  Pt has applied for the new position at work and he is to be interviewed for it next week.  His friend in [place] is doing chemo and is feeling OK right now.  Pt shares that he has been more motivated in the past couple weeks and he is happy about that.  Pt shares that his next re-enactment is scheduled in mid-July in [place].  Pt shares that he has been sleeping pretty well since our last session.  Pt shares that he has only had a drink or two in the past week and feels good about that direction.  Pt shares he is back on his eating plan this week and has been doing well with it since Monday.  [name] is  back on Prednisone to help with her wrist and hand pain and it seems to be helping.  Pt shares he has been continuing to read his Big Book (AA) and is enjoying what he is reading.  Encouraged pt to continue with his self care activities and we will meet in 2 wks for a follow up session.  Interventions: Cognitive Behavioral Therapy  Diagnosis:Adjustment disorder with mixed anxiety and depressed mood  Plan: Treatment Plan Strengths/Abilities:  Intelligent, Intuitive, Willing to participate in therapy Treatment Preferences:  Outpatient Individual Therapy Statement of Needs:  Patient is to use CBT, mindfulness and coping skills to help manage and/or decrease symptoms associated with their diagnosis. Symptoms:  Depressed/Irritable mood, worry, social withdrawal Problems Addressed:  Depressive thoughts, Sadness, Sleep issues, etc. Long Term Goals:  Pt to reduce overall level, frequency, and intensity of the feelings of depression as evidenced by decreased irritability, negative self talk, and helpless feelings from 6 to 7 days/week to 0 to 1 days/week, per client report, for at least 3 consecutive months.  Progress: 20% Short Term Goals:  Pt to verbally express understanding of the relationship between feelings of depression and their impact on thinking patterns and behaviors.  Pt to verbalize an understanding of the role that distorted thinking plays in creating fears, excessive worry, and ruminations.  Progress: 20% Target Date:  11/17/2022 Frequency:  Bi-weekly Modality:  Cognitive Behavioral Therapy Interventions by Therapist:  Therapist will use CBT, Mindfulness exercises, Coping skills and Referrals, as needed by client. Client has verbally approved this treatment plan.  Ivan Anchors, [facility]

## 2022-05-24 ENCOUNTER — Ambulatory Visit: Payer: Self-pay | Admitting: Behavioral Health

## 2022-05-26 ENCOUNTER — Ambulatory Visit (INDEPENDENT_AMBULATORY_CARE_PROVIDER_SITE_OTHER): Payer: 59 | Admitting: Psychology

## 2022-05-26 ENCOUNTER — Ambulatory Visit (INDEPENDENT_AMBULATORY_CARE_PROVIDER_SITE_OTHER): Payer: 59 | Admitting: Family Medicine

## 2022-05-26 ENCOUNTER — Other Ambulatory Visit (HOSPITAL_COMMUNITY): Payer: Self-pay

## 2022-05-26 ENCOUNTER — Encounter (INDEPENDENT_AMBULATORY_CARE_PROVIDER_SITE_OTHER): Payer: Self-pay | Admitting: Family Medicine

## 2022-05-26 VITALS — BP 122/80 | HR 88 | Temp 97.5°F | Ht 69.0 in | Wt 240.2 lb

## 2022-05-26 DIAGNOSIS — E669 Obesity, unspecified: Secondary | ICD-10-CM | POA: Diagnosis not present

## 2022-05-26 DIAGNOSIS — E559 Vitamin D deficiency, unspecified: Secondary | ICD-10-CM | POA: Diagnosis not present

## 2022-05-26 DIAGNOSIS — Z6835 Body mass index (BMI) 35.0-35.9, adult: Secondary | ICD-10-CM | POA: Diagnosis not present

## 2022-05-26 DIAGNOSIS — F4323 Adjustment disorder with mixed anxiety and depressed mood: Secondary | ICD-10-CM | POA: Diagnosis not present

## 2022-05-26 DIAGNOSIS — Z7985 Long-term (current) use of injectable non-insulin antidiabetic drugs: Secondary | ICD-10-CM

## 2022-05-26 DIAGNOSIS — E119 Type 2 diabetes mellitus without complications: Secondary | ICD-10-CM | POA: Diagnosis not present

## 2022-05-26 MED ORDER — TIRZEPATIDE 5 MG/0.5ML ~~LOC~~ SOAJ
5.0000 mg | SUBCUTANEOUS | 0 refills | Status: DC
  Filled 2022-05-26 (×2): qty 2, 28d supply, fill #0

## 2022-05-26 MED ORDER — VITAMIN D (ERGOCALCIFEROL) 1.25 MG (50000 UNIT) PO CAPS
50000.0000 [IU] | ORAL_CAPSULE | ORAL | 0 refills | Status: DC
  Filled 2022-05-26: qty 4, 28d supply, fill #0

## 2022-05-26 NOTE — Progress Notes (Signed)
[facility] Counselor/Therapist Progress Note  [name], MRN: [id number],    Date: 05/26/2022  Time Spent: 45 mins  Treatment Type: Individual Therapy  Reported Symptoms: Pt presented via webex video for follow up session, granting consent for the session.  Pt stated he was in his home and no one else was present.  I shared with pt that I am in my office and no one else is present.  Mental Status Exam: Appearance:  Casual     Behavior: Appropriate  Motor: Normal  Speech/Language:  Clear and Coherent  Affect: Appropriate  Mood: normal  Thought process: normal  Thought content:   WNL  Sensory/Perceptual disturbances:   WNL  Orientation: oriented to person, place, and time/date  Attention: Good  Concentration: Good  Memory: WNL  Fund of knowledge:  Good  Insight:   Good  Judgment:  Good  Impulse Control: Good   Risk Assessment: Danger to Self:  No Self-injurious Behavior: No Danger to Others: No Duty to Warn:no Physical Aggression / Violence:No  Access to Firearms a concern: No  Gang Involvement:No   Subjective: Pt shares that "I had a good Fourth of July; played a couple of times with the [place] and [employer].  I had a good turn out for both shows."  He has filled out the application for the new position and he interviewed for it last Friday.  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  He would like to have this new position but is satisfied if he stays in his current position.  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  Pt shares that his daughter's boyfriend has talked about moving out but does not appear to be taking steps to get his stuff together.  Pt and other family have offered to help him pack up and get out of the house.  Pt shares that [name] had a doctor's appt today related to her right wrist; it is probably related to a tendon issue.  She is  being scheduled for surgery to replace her left elbow.  Pt shares that "I am not sure how much longer I can deal with all of this stuff [name], his daughter, her boyfriend, etc.).  He shares that he had his yearly physical last week and all of his lab work was WNL; this was the first time in his memory that this was the case.  He feels really good about that as well.  Encouraged pt to make a list of the things he needs to accomplish and to mark things off as he gets them done.  He enjoys spending time with his granddaughter as much as he can.  He also has spent time with his daughter's dog and he knows how helpful that is for him.  Pt shares he has a re-enactment to attend this weekend and is looking forward to that event.  Encouraged pt to continue reading the [place] and we will meet in 3 wks for a follow up session.  Interventions: Cognitive Behavioral Therapy  Diagnosis:Adjustment disorder with mixed anxiety and depressed mood  Plan: Treatment Plan Strengths/Abilities:  Intelligent, Intuitive, Willing to participate in therapy Treatment Preferences:  Outpatient Individual Therapy Statement of Needs:  Patient is to use CBT, mindfulness and coping skills to help manage and/or decrease symptoms associated with their diagnosis. Symptoms:  Depressed/Irritable mood,  worry, social withdrawal Problems Addressed:  Depressive thoughts, Sadness, Sleep issues, etc. Long Term Goals:  Pt to reduce overall level, frequency, and intensity of the feelings of depression as evidenced by decreased irritability, negative self talk, and helpless feelings from 6 to 7 days/week to 0 to 1 days/week, per client report, for at least 3 consecutive months.  Progress: 20% Short Term Goals:  Pt to verbally express understanding of the relationship between feelings of depression and their impact on thinking patterns and behaviors.  Pt to verbalize an understanding of the role that distorted thinking plays in creating fears,  excessive worry, and ruminations.  Progress: 20% Target Date:  11/17/2022 Frequency:  Bi-weekly Modality:  Cognitive Behavioral Therapy Interventions by Therapist:  Therapist will use CBT, Mindfulness exercises, Coping skills and Referrals, as needed by client. Client has verbally approved this treatment plan.  Ivan Anchors, [facility]

## 2022-05-27 NOTE — Progress Notes (Signed)
Chief Complaint:   OBESITY Kenneth Green is here to discuss his progress with his obesity treatment plan along with follow-up of his obesity related diagnoses. Kenneth Green is on the Category 4 Plan and states he is following his eating plan approximately 75% of the time. Kenneth Green states he is walking and doing outdoor activity for 90 minutes 5 times per week.  Today's visit was #: 21 Starting weight: 308 lbs Starting date: 04/10/2019 Today's weight: 240 lbs Today's date: 05/26/2022 Total lbs lost to date: 68 Total lbs lost since last in-office visit: 3  Interim History: Kenneth Green continues to work on his diet and weight loss.  His hunger is controlled and he is doing well with meal planning and prepping.  He is doing civil war reenactment's and he is at high risk of dehydration especially in the recent heat.  Subjective:   1. Controlled type 2 diabetes mellitus without complication, without long-term current use of insulin (HCC) Kenneth Green's recent A1c was well controlled at 5.4.  He is stable on Mounjaro.  2. Vitamin D deficiency Kenneth Green's vitamin D level was 42 at [facility].  He is on vitamin D prescription, with no side effects noted.  Assessment/Plan:   1. Controlled type 2 diabetes mellitus without complication, without long-term current use of insulin (Kenneth Green) Kenneth Green will continue Mounjaro 5 mg once weekly, and we will refill for 1 month.  - tirzepatide [facility]) 5 MG/0.5ML Pen; Inject 5 mg into the skin once a week.  Dispense: 2 mL; Refill: 0  2. Vitamin D deficiency Kenneth Green will continue prescription vitamin D 50,000 units once weekly, and we will refill for 1 month.  - Vitamin D, Ergocalciferol, (DRISDOL) 1.25 MG (50000 UNIT) CAPS capsule; Take 1 capsule by mouth every 7 days.  Dispense: 4 capsule; Refill: 0  3. Obesity, Current BMI 35.5 Kenneth Green is currently in the action stage of change. As such, his goal is to continue with weight loss efforts. He has agreed to the Category 4 Plan.    Exercise goals: As is.  Behavioral modification strategies: Increase electrolytes as well as water. increasing lean protein intake, increasing water intake, and meal planning and cooking strategies.  Kenneth Green has agreed to follow-up with our clinic in 4 weeks. He was informed of the importance of frequent follow-up visits to maximize his success with intensive lifestyle modifications for his multiple health conditions.   Objective:   Blood pressure 122/80, pulse 88, temperature (!) 97.5 F (36.4 C), height '5\' 9"'$  (1.753 m), weight 240 lb 3.2 oz (109 kg), SpO2 99 %. Body mass index is 35.47 kg/m.  General: Cooperative, alert, well developed, in no acute distress. HEENT: Conjunctivae and lids unremarkable. Cardiovascular: Regular rhythm.  Lungs: Normal work of breathing. Neurologic: No focal deficits.   Lab Results  Component Value Date   CREATININE 0.98 04/26/2022   BUN 17 04/26/2022   NA 138 04/26/2022   K 3.4 (L) 04/26/2022   CL 102 04/26/2022   CO2 23 04/26/2022   Lab Results  Component Value Date   ALT 31 04/26/2022   AST 23 04/26/2022   ALKPHOS 64 04/26/2022   BILITOT 0.3 04/26/2022   Lab Results  Component Value Date   HGBA1C 5.4 04/26/2022   HGBA1C 5.4 01/19/2022   HGBA1C 5.4 11/11/2021   HGBA1C 5.9 (H) 01/15/2021   HGBA1C 5.8 (H) 07/23/2020   Lab Results  Component Value Date   INSULIN 23.5 04/26/2022   INSULIN 20.5 11/11/2021   INSULIN 12.8 01/15/2021   INSULIN  18.1 07/23/2020   INSULIN 27.0 (H) 12/19/2019   Lab Results  Component Value Date   TSH 1.340 04/10/2019   Lab Results  Component Value Date   CHOL 193 04/26/2022   HDL 58 04/26/2022   LDLCALC 116 (H) 04/26/2022   TRIG 106 04/26/2022   Lab Results  Component Value Date   VD25OH 46.4 04/26/2022   VD25OH 34.5 11/11/2021   VD25OH 35.0 05/11/2021   Lab Results  Component Value Date   WBC 4.6 04/26/2022   HGB 13.8 04/26/2022   HCT 42.7 04/26/2022   MCV 84 04/26/2022   PLT 223  04/26/2022   No results found for: "IRON", "TIBC", "FERRITIN"  Attestation Statements:   Reviewed by clinician on day of visit: allergies, medications, problem list, medical history, surgical history, family history, social history, and previous encounter notes.   I, Trixie Dredge, am acting as transcriptionist for Dennard Nip, MD.  I have reviewed the above documentation for accuracy and completeness, and I agree with the above. -  Dennard Nip, MD

## 2022-05-31 ENCOUNTER — Telehealth (INDEPENDENT_AMBULATORY_CARE_PROVIDER_SITE_OTHER): Payer: Self-pay | Admitting: Family Medicine

## 2022-05-31 ENCOUNTER — Encounter (INDEPENDENT_AMBULATORY_CARE_PROVIDER_SITE_OTHER): Payer: Self-pay

## 2022-05-31 NOTE — Telephone Encounter (Signed)
Dr. Leafy Ro - Prior authorization denied for [facility]. Patient has type 2 diabetes. Patient already uses copay card. Patient sent denial message via mychart.

## 2022-06-08 ENCOUNTER — Encounter: Payer: Self-pay | Admitting: Behavioral Health

## 2022-06-08 ENCOUNTER — Ambulatory Visit (INDEPENDENT_AMBULATORY_CARE_PROVIDER_SITE_OTHER): Payer: 59 | Admitting: Behavioral Health

## 2022-06-08 DIAGNOSIS — F331 Major depressive disorder, recurrent, moderate: Secondary | ICD-10-CM | POA: Diagnosis not present

## 2022-06-08 DIAGNOSIS — Z638 Other specified problems related to primary support group: Secondary | ICD-10-CM | POA: Diagnosis not present

## 2022-06-08 DIAGNOSIS — F4323 Adjustment disorder with mixed anxiety and depressed mood: Secondary | ICD-10-CM

## 2022-06-08 DIAGNOSIS — F411 Generalized anxiety disorder: Secondary | ICD-10-CM

## 2022-06-08 MED ORDER — SERTRALINE HCL 100 MG PO TABS: 100.0000 mg | ORAL_TABLET | Freq: Every day | ORAL | 1 refills | Status: DC

## 2022-06-08 NOTE — Progress Notes (Signed)
[facility]  [name],  MRN: [id number]  PCP: Cari Caraway, MD  Date of Evaluation: 06/08/2022 Time spent:30 minutes  Chief Complaint:  Chief Complaint   Anxiety; Depression; Medication Refill; Family Problem; Stress; Caregiver Role Strain     HISTORY/CURRENT STATUS: HPI  56 year old male presents to this office for follow up and medication management. He is very pleased with how the Zoloft is working so far but is having some slight increase in depression recently. He feels like it may be time to increase slightly. Says his wife has made small improvements, but is now back to staying at home most of the time. He is continuing psychotherapy and says that he has convinced his wife to get therapy as well. He reports his anxiety today at 3/10  and depression at 5/10. Says he is sleeping 7-8 hours nightly. Denies mania, no psychosis. No SI/HI.    No Past medication failures  Individual Medical History/ Review of Systems: Changes? :No   Allergies: Iohexol  Current Medications:  Current Outpatient Medications:    sertraline (ZOLOFT) 100 MG tablet, Take 1 tablet (100 mg total) by mouth daily., Disp: 90 tablet, Rfl: 1   benazepril-hydrochlorthiazide (LOTENSIN HCT) 10-12.5 MG tablet, Take 1 tablet by mouth 2 (two) times daily., Disp: 60 tablet, Rfl: 0   buPROPion (WELLBUTRIN SR) 200 MG 12 hr tablet, TAKE 1 TABLET BY MOUTH TWICE  DAILY, Disp: 180 tablet, Rfl: 0   fexofenadine (ALLEGRA) 180 MG tablet, Take 180 mg by mouth daily., Disp: , Rfl:    pregabalin (LYRICA) 150 MG capsule, Take 150 mg by mouth 2 (two) times daily., Disp: , Rfl:    sertraline (ZOLOFT) 50 MG tablet, TAKE 1 AND 1/2 TABLETS BY MOUTH  DAILY, Disp: 135 tablet, Rfl: 0   tirzepatide (MOUNJARO) 5 MG/0.5ML Pen, Inject 5 mg into the skin once a week., Disp: 2 mL, Rfl: 0   Travoprost, BAK Free, (TRAVATAN) 0.004 % SOLN ophthalmic solution, Place 1 drop into both eyes at bedtime., Disp: , Rfl:     Vitamin D, Ergocalciferol, (DRISDOL) 1.25 MG (50000 UNIT) CAPS capsule, Take 1 capsule by mouth every 7 days., Disp: 4 capsule, Rfl: 0 Medication Side Effects: none  Family Medical/ Social History: Changes? No  MENTAL HEALTH EXAM:  There were no vitals taken for this visit.There is no height or weight on file to calculate BMI.  General Appearance: Casual, Neat, and Well Groomed  Eye Contact:  Good  Speech:  Clear and Coherent  Volume:  Normal  Mood:  Anxious  Affect:  Appropriate  Thought Process:  Coherent  Orientation:  Full (Time, Place, and Person)  Thought Content: Logical   Suicidal Thoughts:  No  Homicidal Thoughts:  No  Memory:  WNL  Judgement:  Good  Insight:  Good  Psychomotor Activity:  Normal  Concentration:  Concentration: Good  Recall:  Good  Fund of Knowledge: Good  Language: Good  Assets:  Desire for Improvement  ADL's:  Intact  Cognition: WNL  Prognosis:  Good    DIAGNOSES:    ICD-10-CM   1. Adjustment disorder with mixed anxiety and depressed mood  F43.23     2. Generalized anxiety disorder  F41.1 sertraline (ZOLOFT) 100 MG tablet    3. Major depressive disorder, recurrent episode, moderate (HCC)  F33.1 sertraline (ZOLOFT) 100 MG tablet    4. Caregiver role strain  Z63.8 sertraline (ZOLOFT) 100 MG tablet      Receiving Psychotherapy: No  RECOMMENDATIONS:   Greater than 50% of  30  min face to face time with patient was spent on counseling and coordination of care. No changes this visit.  We discussed his significant improvement with depression and anxiety. 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 We discussed the following plan: Increase  Zoloft 100 mg daily Continue Wellbutrin 200 mg SR two times daily To report side effects or worsening symptoms promptly Provided emergency contact information To follow up in 3 months to  reassess. Reviewed PDMP    Elwanda Brooklyn, NP

## 2022-06-14 ENCOUNTER — Other Ambulatory Visit (HOSPITAL_COMMUNITY): Payer: Self-pay

## 2022-06-17 ENCOUNTER — Ambulatory Visit (INDEPENDENT_AMBULATORY_CARE_PROVIDER_SITE_OTHER): Payer: 59 | Admitting: Psychology

## 2022-06-17 DIAGNOSIS — F4323 Adjustment disorder with mixed anxiety and depressed mood: Secondary | ICD-10-CM

## 2022-06-17 NOTE — Progress Notes (Signed)
[facility] Counselor/Therapist Progress Note  [name], MRN: [id number],    Date: 06/17/2022  Time Spent: 45 mins  Treatment Type: Individual Therapy  Reported Symptoms: Pt presented via webex video for follow up session, granting consent for the session.  Pt stated he was in his home and no one else was present.  I shared with pt that I am in my office and no one else is present.  Mental Status Exam: Appearance:  Casual     Behavior: Appropriate  Motor: Normal  Speech/Language:  Clear and Coherent  Affect: Appropriate  Mood: normal  Thought process: normal  Thought content:   WNL  Sensory/Perceptual disturbances:   WNL  Orientation: oriented to person, place, and time/date  Attention: Good  Concentration: Good  Memory: WNL  Fund of knowledge:  Good  Insight:   Good  Judgment:  Good  Impulse Control: Good   Risk Assessment: Danger to Self:  No Self-injurious Behavior: No Danger to Others: No Duty to Warn:no Physical Aggression / Violence:No  Access to Firearms a concern: No  Gang Involvement:No   Subjective: Pt shares that "I got the new job but it hasn't started yet."  Congratulated pt on going out and getting the new job; he has not been told when he will start the new position.  It will also be a night position as well, which works for pt because he has to take [name] to her doctors appts.  [name]'s elbow replacement surgery is next Thursday (8/3).  This will be both knees, a shoulder, both elbows, and both hips.  Pt shares that his daughter's boyfriend has moved out of the house next door and into a home owned by his boss in [place].  They will be changing the locks on his daughter's home.  She is planning to come back home on 8/16.  Pt shares that he thinks his daughter may come home with a new boyfriend who also has a daughter.  If she is in a relationship with this new guy, pt has known him for a long time.  Pt shares that his granddaughter  Orion Modest) will be one yr old in Sept; they see her at least once per week.  His daughter and son-in-law are still doing well and she is enjoying being a Psychologist, clinical.  Pt shares while there is a lot going on in his life right now, mostly he sees it all as very positive.  Pt shares that he missed a re-enactment a couple of weeks ago because he had the dates wrong, but he is OK with how it worked out.  Pt shares he has not been reading any of the AA book lately; encouraged pt to continue reading as he has occasion to do so.  Talked with pt about his self care activities; they are mostly working outside and in his garden.  He is taking lots of produce to work to share with coworkers.  Pt shares he has not been eating well;  "I know I am addicted to carbs and sugar."  Encouraged pt to give himself the opportunity to choose differently with his next set of decisions.  We will meet in 3 wks for a follow up session.  Interventions: Cognitive Behavioral Therapy  Diagnosis:Adjustment disorder with mixed anxiety and depressed mood  Plan: Treatment Plan Strengths/Abilities:  Intelligent, Intuitive, Willing to participate in therapy Treatment Preferences:  Outpatient Individual Therapy Statement of Needs:  Patient is to use CBT, mindfulness and coping  skills to help manage and/or decrease symptoms associated with their diagnosis. Symptoms:  Depressed/Irritable mood, worry, social withdrawal Problems Addressed:  Depressive thoughts, Sadness, Sleep issues, etc. Long Term Goals:  Pt to reduce overall level, frequency, and intensity of the feelings of depression as evidenced by decreased irritability, negative self talk, and helpless feelings from 6 to 7 days/week to 0 to 1 days/week, per client report, for at least 3 consecutive months.  Progress: 20% Short Term Goals:  Pt to verbally express understanding of the relationship between feelings of depression and their impact on thinking patterns and behaviors.  Pt to verbalize  an understanding of the role that distorted thinking plays in creating fears, excessive worry, and ruminations.  Progress: 20% Target Date:  11/17/2022 Frequency:  Bi-weekly Modality:  Cognitive Behavioral Therapy Interventions by Therapist:  Therapist will use CBT, Mindfulness exercises, Coping skills and Referrals, as needed by client. Client has verbally approved this treatment plan.  Ivan Anchors, [facility]

## 2022-06-23 ENCOUNTER — Ambulatory Visit (INDEPENDENT_AMBULATORY_CARE_PROVIDER_SITE_OTHER): Payer: 59 | Admitting: Family Medicine

## 2022-06-23 ENCOUNTER — Encounter (INDEPENDENT_AMBULATORY_CARE_PROVIDER_SITE_OTHER): Payer: Self-pay | Admitting: Family Medicine

## 2022-06-23 ENCOUNTER — Other Ambulatory Visit (HOSPITAL_COMMUNITY): Payer: Self-pay

## 2022-06-23 VITALS — BP 118/75 | HR 57 | Temp 97.6°F | Ht 69.0 in | Wt 249.0 lb

## 2022-06-23 DIAGNOSIS — Z6836 Body mass index (BMI) 36.0-36.9, adult: Secondary | ICD-10-CM

## 2022-06-23 DIAGNOSIS — F3289 Other specified depressive episodes: Secondary | ICD-10-CM

## 2022-06-23 DIAGNOSIS — E1169 Type 2 diabetes mellitus with other specified complication: Secondary | ICD-10-CM

## 2022-06-23 DIAGNOSIS — E669 Obesity, unspecified: Secondary | ICD-10-CM

## 2022-06-23 DIAGNOSIS — E66813 Obesity, class 3: Secondary | ICD-10-CM

## 2022-06-23 DIAGNOSIS — Z7985 Long-term (current) use of injectable non-insulin antidiabetic drugs: Secondary | ICD-10-CM

## 2022-06-23 MED ORDER — BD PEN NEEDLE NANO U/F 32G X 4 MM MISC
1.0000 [IU] | 0 refills | Status: AC
  Filled 2022-06-23 – 2022-06-24 (×2): qty 100, 90d supply, fill #0

## 2022-06-23 MED ORDER — SEMAGLUTIDE (1 MG/DOSE) 4 MG/3ML ~~LOC~~ SOPN
1.0000 mg | PEN_INJECTOR | SUBCUTANEOUS | 0 refills | Status: DC
  Filled 2022-06-23: qty 3, 28d supply, fill #0

## 2022-06-24 ENCOUNTER — Other Ambulatory Visit (HOSPITAL_COMMUNITY): Payer: Self-pay

## 2022-06-28 ENCOUNTER — Encounter (INDEPENDENT_AMBULATORY_CARE_PROVIDER_SITE_OTHER): Payer: Self-pay | Admitting: Family Medicine

## 2022-06-28 ENCOUNTER — Other Ambulatory Visit: Payer: Self-pay | Admitting: Behavioral Health

## 2022-06-28 ENCOUNTER — Encounter (INDEPENDENT_AMBULATORY_CARE_PROVIDER_SITE_OTHER): Payer: Self-pay

## 2022-06-28 ENCOUNTER — Other Ambulatory Visit (HOSPITAL_COMMUNITY): Payer: Self-pay

## 2022-06-28 ENCOUNTER — Telehealth (INDEPENDENT_AMBULATORY_CARE_PROVIDER_SITE_OTHER): Payer: Self-pay | Admitting: Family Medicine

## 2022-06-28 ENCOUNTER — Other Ambulatory Visit (INDEPENDENT_AMBULATORY_CARE_PROVIDER_SITE_OTHER): Payer: Self-pay | Admitting: Family Medicine

## 2022-06-28 DIAGNOSIS — F411 Generalized anxiety disorder: Secondary | ICD-10-CM

## 2022-06-28 DIAGNOSIS — E1169 Type 2 diabetes mellitus with other specified complication: Secondary | ICD-10-CM

## 2022-06-28 DIAGNOSIS — F331 Major depressive disorder, recurrent, moderate: Secondary | ICD-10-CM

## 2022-06-28 MED ORDER — METFORMIN HCL 500 MG PO TABS
500.0000 mg | ORAL_TABLET | Freq: Every day | ORAL | 0 refills | Status: DC
  Filled 2022-06-28: qty 30, 30d supply, fill #0

## 2022-06-28 NOTE — Telephone Encounter (Signed)
Dr. Leafy Ro - Prior authorization denied for Ozempic. Office notes with labs attached to prior authorization request.  Per insurance:   All of the following: (1) One of the following: (A) Your doctor submits medical records (for example, chart notes) confirming diagnosis of type 2 diabetes mellitus as evidenced by one of the following laboratory values: (I) A1C greater than or equal to 6.5%. (II) Fasting plasma glucose greater than or equal to '126mg'$ /dL. (III) 2-hour plasma glucose greater than or equal to '200mg'$ /dL during oral glucose tolerance test. (B) If you require ongoing treatment for type 2 diabetes mellitus (that is, diagnosed greater than 2 years ago), your doctor submits medical records (for example, chart notes) confirming diagnosis of type 2 diabetes mellitus. (2) You have a history of suboptimal response (after a three-month trial), contraindication or intolerance to metformin (generic Glucophage, Glucophage XR. Patient sent denial message via mychart.

## 2022-06-30 ENCOUNTER — Encounter (INDEPENDENT_AMBULATORY_CARE_PROVIDER_SITE_OTHER): Payer: Self-pay

## 2022-07-03 ENCOUNTER — Other Ambulatory Visit: Payer: Self-pay | Admitting: Behavioral Health

## 2022-07-03 DIAGNOSIS — F3289 Other specified depressive episodes: Secondary | ICD-10-CM

## 2022-07-04 NOTE — Progress Notes (Unsigned)
Chief Complaint:   OBESITY Kenneth Green is here to discuss his progress with his obesity treatment plan along with follow-up of his obesity related diagnoses. Kenneth Green is on the Category 4 Plan and states he is following his eating plan approximately 75% of the time. Kenneth Green states he is walking for 90 minutes 5 times per week.  Today's visit was #: 44 Starting weight: 308 lbs Starting date: 04/10/2019 Today's weight: 249 lbs Today's date: 06/23/2022 Total lbs lost to date: 59 Total lbs lost since last in-office visit: 0  Interim History: Kenneth Green is struggling with increased stress/comfort eating recently.  He also notes increased hunger while off his GLP-1.  His wife is having surgery soon and he is in change of planning and prepping.  Subjective:   1. Type 2 diabetes mellitus with other specified complication, unspecified whether long term insulin use (HCC) Kenneth Green is doing well with his diet and weight loss.  His insurance company will no longer cover Mounjaro but his glucose and polyphagia is not as well-controlled.  2. Other depression with emotional eating Kenneth Green notes increased emotional eating behaviors.  His stress has increased and despite being on Wellbutrin and Zoloft he has been doing more comfort eating.  Assessment/Plan:   1. Type 2 diabetes mellitus with other specified complication, unspecified whether long term insulin use (HCC) Kenneth Green agreed to discontinue Mounjaro, and start Ozempic 1 mg once weekly with no refills and Nano needles #100 with no refills.  - Semaglutide, 1 MG/DOSE, 4 MG/3ML SOPN; Inject 1 mg as directed once a week.  Dispense: 3 mL; Refill: 0 - Insulin Pen Needle (BD PEN NEEDLE NANO U/F) 32G X 4 MM MISC; Use 1 pen needle as directed.  Dispense: 100 each; Refill: 0  2. Other depression with emotional eating Kenneth Green will continue his medications, and emotional eating behavior strategies were discussed in depth today.  3. Obesity, Current BMI  36.8 Kenneth Green is currently in the action stage of change. As such, his goal is to continue with weight loss efforts. He has agreed to the Category 4 Plan.   Exercise goals: As is.   Behavioral modification strategies: decreasing eating out and emotional eating strategies.  Kenneth Green has agreed to follow-up with our clinic in 3 to 4 weeks. He was informed of the importance of frequent follow-up visits to maximize his success with intensive lifestyle modifications for his multiple health conditions.   Objective:   Blood pressure 118/75, pulse (!) 57, temperature 97.6 F (36.4 C), height '5\' 9"'$  (1.753 m), weight 249 lb (112.9 kg), SpO2 99 %. Body mass index is 36.77 kg/m.  General: Cooperative, alert, well developed, in no acute distress. HEENT: Conjunctivae and lids unremarkable. Cardiovascular: Regular rhythm.  Lungs: Normal work of breathing. Neurologic: No focal deficits.   Lab Results  Component Value Date   CREATININE 0.98 04/26/2022   BUN 17 04/26/2022   NA 138 04/26/2022   K 3.4 (L) 04/26/2022   CL 102 04/26/2022   CO2 23 04/26/2022   Lab Results  Component Value Date   ALT 31 04/26/2022   AST 23 04/26/2022   ALKPHOS 64 04/26/2022   BILITOT 0.3 04/26/2022   Lab Results  Component Value Date   HGBA1C 5.4 04/26/2022   HGBA1C 5.4 01/19/2022   HGBA1C 5.4 11/11/2021   HGBA1C 5.9 (H) 01/15/2021   HGBA1C 5.8 (H) 07/23/2020   Lab Results  Component Value Date   INSULIN 23.5 04/26/2022   INSULIN 20.5 11/11/2021   INSULIN 12.8  01/15/2021   INSULIN 18.1 07/23/2020   INSULIN 27.0 (H) 12/19/2019   Lab Results  Component Value Date   TSH 1.340 04/10/2019   Lab Results  Component Value Date   CHOL 193 04/26/2022   HDL 58 04/26/2022   LDLCALC 116 (H) 04/26/2022   TRIG 106 04/26/2022   Lab Results  Component Value Date   VD25OH 46.4 04/26/2022   VD25OH 34.5 11/11/2021   VD25OH 35.0 05/11/2021   Lab Results  Component Value Date   WBC 4.6 04/26/2022   HGB 13.8  04/26/2022   HCT 42.7 04/26/2022   MCV 84 04/26/2022   PLT 223 04/26/2022   No results found for: "IRON", "TIBC", "FERRITIN"  Attestation Statements:   Reviewed by clinician on day of visit: allergies, medications, problem list, medical history, surgical history, family history, social history, and previous encounter notes.  Time spent on visit including pre-visit chart review and post-visit care and charting was 44 minutes.   I, Trixie Dredge, am acting as transcriptionist for Dennard Nip, MD.  I have reviewed the above documentation for accuracy and completeness, and I agree with the above. -  Dennard Nip, MD

## 2022-07-06 ENCOUNTER — Other Ambulatory Visit (HOSPITAL_COMMUNITY): Payer: Self-pay

## 2022-07-07 ENCOUNTER — Ambulatory Visit (INDEPENDENT_AMBULATORY_CARE_PROVIDER_SITE_OTHER): Payer: 59 | Admitting: Psychology

## 2022-07-07 DIAGNOSIS — F4323 Adjustment disorder with mixed anxiety and depressed mood: Secondary | ICD-10-CM

## 2022-07-07 NOTE — Progress Notes (Signed)
[facility] Counselor/Therapist Progress Note  Patient ID: NIRVAAN FRETT, MRN: [id number],    Date: 07/07/2022  Time Spent: 45 mins  Treatment Type: Individual Therapy  Reported Symptoms: Pt presented via webex video for follow up session, granting consent for the session.  Pt stated he was in his home and no one else was present.  I shared with pt that I am in my office and no one else is present.  Mental Status Exam: Appearance:  Casual     Behavior: Appropriate  Motor: Normal  Speech/Language:  Clear and Coherent  Affect: Appropriate  Mood: normal  Thought process: normal  Thought content:   WNL  Sensory/Perceptual disturbances:   WNL  Orientation: oriented to person, place, and time/date  Attention: Good  Concentration: Good  Memory: WNL  Fund of knowledge:  Good  Insight:   Good  Judgment:  Good  Impulse Control: Good   Risk Assessment: Danger to Self:  No Self-injurious Behavior: No Danger to Others: No Duty to Warn:no Physical Aggression / Violence:No  Access to Firearms a concern: No  Gang Involvement:No   Subjective: Pt shares that "Joellen Jersey is back home and she is alone now."  Her former boyfriend is gone and has continued to stay away.  Pt shares he has still not started his new job at work yet.  He does not have a time table on when he will start it.  He feels frustrated that it has not moved forward yet.  Pt shares [name] had her elbow replacement surgery on 8/3 and she is recovering nicely.  She is now having issues with her wrists and hands "so I am having to do even more things for her."  Pt continues to garden and work in his yard; he continues to take vegetables to work to give away.  Pt shares that he is drinking less, mostly due to his daughter being gone for the summer and staying up at the house more.  "I have substituted food for the alcohol."  Pt shares that he can tie eating issues with stresses in his life.  Pt shares he has not been able  to read much more of his AA book.  Talked about pt being intentional about setting aside time for him to read the book more and talked about why that will help him with his eating and drinking habits.  Encouraged pt to be willing to talk in his sessions about the things he reads and any questions he runs into in he reading.  Pt shares that he is not getting enough sleep.  Pt will start working T-F; 5 pm to 530 am.  He is only paid for 40 hrs per week as he is salaried.  Encouraged pt to continue with his self care activities and we will meet in 4 wks for a follow up session.  Interventions: Cognitive Behavioral Therapy  Diagnosis:Adjustment disorder with mixed anxiety and depressed mood  Plan: Treatment Plan Strengths/Abilities:  Intelligent, Intuitive, Willing to participate in therapy Treatment Preferences:  Outpatient Individual Therapy Statement of Needs:  Patient is to use CBT, mindfulness and coping skills to help manage and/or decrease symptoms associated with their diagnosis. Symptoms:  Depressed/Irritable mood, worry, social withdrawal Problems Addressed:  Depressive thoughts, Sadness, Sleep issues, etc. Long Term Goals:  Pt to reduce overall level, frequency, and intensity of the feelings of depression as evidenced by decreased irritability, negative self talk, and helpless feelings from 6 to 7 days/week to 0 to 1  days/week, per client report, for at least 3 consecutive months.  Progress: 20% Short Term Goals:  Pt to verbally express understanding of the relationship between feelings of depression and their impact on thinking patterns and behaviors.  Pt to verbalize an understanding of the role that distorted thinking plays in creating fears, excessive worry, and ruminations.  Progress: 20% Target Date:  11/17/2022 Frequency:  Bi-weekly Modality:  Cognitive Behavioral Therapy Interventions by Therapist:  Therapist will use CBT, Mindfulness exercises, Coping skills and Referrals, as needed  by client. Client has verbally approved this treatment plan.  Ivan Anchors, [facility]

## 2022-07-21 ENCOUNTER — Ambulatory Visit (INDEPENDENT_AMBULATORY_CARE_PROVIDER_SITE_OTHER): Payer: 59 | Admitting: Family Medicine

## 2022-07-21 ENCOUNTER — Encounter (INDEPENDENT_AMBULATORY_CARE_PROVIDER_SITE_OTHER): Payer: Self-pay | Admitting: Family Medicine

## 2022-07-21 VITALS — BP 109/71 | HR 67 | Temp 98.0°F | Ht 69.0 in | Wt 254.0 lb

## 2022-07-21 DIAGNOSIS — E559 Vitamin D deficiency, unspecified: Secondary | ICD-10-CM

## 2022-07-21 DIAGNOSIS — Z6837 Body mass index (BMI) 37.0-37.9, adult: Secondary | ICD-10-CM

## 2022-07-21 DIAGNOSIS — Z7984 Long term (current) use of oral hypoglycemic drugs: Secondary | ICD-10-CM

## 2022-07-21 DIAGNOSIS — E1169 Type 2 diabetes mellitus with other specified complication: Secondary | ICD-10-CM | POA: Diagnosis not present

## 2022-07-21 DIAGNOSIS — E119 Type 2 diabetes mellitus without complications: Secondary | ICD-10-CM

## 2022-07-21 DIAGNOSIS — E669 Obesity, unspecified: Secondary | ICD-10-CM

## 2022-07-22 ENCOUNTER — Other Ambulatory Visit (INDEPENDENT_AMBULATORY_CARE_PROVIDER_SITE_OTHER): Payer: Self-pay | Admitting: Family Medicine

## 2022-07-22 ENCOUNTER — Other Ambulatory Visit (HOSPITAL_COMMUNITY): Payer: Self-pay

## 2022-07-22 DIAGNOSIS — E1169 Type 2 diabetes mellitus with other specified complication: Secondary | ICD-10-CM

## 2022-07-22 DIAGNOSIS — E559 Vitamin D deficiency, unspecified: Secondary | ICD-10-CM

## 2022-07-24 ENCOUNTER — Other Ambulatory Visit: Payer: Self-pay | Admitting: Behavioral Health

## 2022-07-24 DIAGNOSIS — F411 Generalized anxiety disorder: Secondary | ICD-10-CM

## 2022-07-24 DIAGNOSIS — F331 Major depressive disorder, recurrent, moderate: Secondary | ICD-10-CM

## 2022-07-29 ENCOUNTER — Other Ambulatory Visit (HOSPITAL_COMMUNITY): Payer: Self-pay

## 2022-08-02 ENCOUNTER — Other Ambulatory Visit (HOSPITAL_COMMUNITY): Payer: Self-pay

## 2022-08-02 MED ORDER — VITAMIN D (ERGOCALCIFEROL) 1.25 MG (50000 UNIT) PO CAPS
50000.0000 [IU] | ORAL_CAPSULE | ORAL | 0 refills | Status: DC
  Filled 2022-08-02: qty 4, 28d supply, fill #0

## 2022-08-02 MED ORDER — METFORMIN HCL 500 MG PO TABS
500.0000 mg | ORAL_TABLET | Freq: Two times a day (BID) | ORAL | 0 refills | Status: DC
  Filled 2022-08-02: qty 60, 30d supply, fill #0

## 2022-08-04 ENCOUNTER — Ambulatory Visit (INDEPENDENT_AMBULATORY_CARE_PROVIDER_SITE_OTHER): Payer: 59 | Admitting: Psychology

## 2022-08-04 DIAGNOSIS — F4323 Adjustment disorder with mixed anxiety and depressed mood: Secondary | ICD-10-CM

## 2022-08-04 NOTE — Progress Notes (Addendum)
TeleHealth Visit:  This visit was completed with telemedicine (audio/video) technology. Kenneth Green has verbally consented to this TeleHealth visit. The patient is located at home, the provider is located at home. The participants in this visit include the listed provider and patient. The visit was conducted today via MyChart video.  OBESITY Kenneth Green is here to discuss his progress with his obesity treatment plan along with follow-up of his obesity related diagnoses.   Today's visit was # 65 Starting weight: 308 lbs Starting date: 04/10/2019 Weight at last in office visit: 254 lbs on 07/21/22 Total weight loss: 14 lbs at last in office visit on 07/21/22. Today's reported weight: No weight reported.   Nutrition Plan: keeping a food journal and adhering to recommended goals of 1500-1800 calories and 100+ gms protein.   Current exercise:  10000-12000 steps daily at work.   Interim History: This is Kenneth Green's first visit with me.  His wife is also a patient.  Kenneth Green started the program in May 2020 at 308 pounds.  He lost down to 236 pounds (01/18/2022) but has regained since then and was 254 pounds at his last in office visit on 07/21/2022. He says that stress eating has caused his weight regain.  Reported stressors -daughter has issues with her ex-husband, wife has worsening chronic health conditions, work is stressful.  He tends to eat carbohydrates when he is stressed-chips, cookies, honey buns, etc.  He denies excessive hunger. He and his wife sometimes eat out at [employer] common places to eat or [place] or [employer].  He makes good choices at the [employer] but tends to get a double hamburger with fries at [employer].  He usually grocery shops after church when he is hungry. He works night shift as a Librarian, academic at [employer]. Denies intake of sugar sweetened beverages. Journals about 4 days/week and his calorie goal but goes over on calories by about 500  cal/day.  Assessment/Plan:  1. Type II Diabetes HgbA1c is at goal. Last A1c was 5.4 on 04/26/2022. He was on Mounjaro until his coupon ran out.  Insurance will not cover Ozempic until he takes metformin for 3 months with suboptimal response. Medication(s): Metformin 500 mg twice daily.  Tolerating well.  Lab Results  Component Value Date   HGBA1C 5.4 04/26/2022   HGBA1C 5.4 01/19/2022   HGBA1C 5.4 11/11/2021   Lab Results  Component Value Date   MICROALBUR <0.7 05/07/2020   LDLCALC 116 (H) 04/26/2022   CREATININE 0.98 04/26/2022    Plan: Continue metformin 500 mg twice daily. Work on reducing simple carbohydrates.   2.  Other depression with emotional eating Food cravings are poorly controlled.  He is on bupropion 200 mg twice daily and Zoloft 50 mg daily.  He sees Darci Current at [facility].  Also sees Dennison Bulla, [facility] regularly.  Plan: Continue bupropion and sertraline as directed. Continue to follow-up with psychiatry and counselor as directed.  3. Obesity: Current BMI 37.5 [place] is currently in the action stage of change. As such, his goal is to continue with weight loss efforts.  He has agreed to keeping a food journal and adhering to recommended goals of 1500-1800 calories and 100 gms protein.   1.  Grocery shop on a full stomach. 2.  Strategies to make better choices when eating out. 3.  Avoid bringing simple carbohydrates into the home. 4.  Download lose it app.  Exercise goals: as is  Behavioral modification strategies: increasing lean protein intake, decreasing simple  carbohydrates, keeping healthy foods in the home, avoiding temptations, and decreasing junk food.  Kenneth Green has agreed to follow-up with our clinic in 2 weeks.   No orders of the defined types were placed in this encounter.   Medications Discontinued During This Encounter  Medication Reason   Semaglutide, 1 MG/DOSE, 4 MG/3ML SOPN Cost of medication     No orders of  the defined types were placed in this encounter.     Objective:   VITALS: Per patient if applicable, see vitals. GENERAL: Alert and in no acute distress. CARDIOPULMONARY: No increased WOB. Speaking in clear sentences.  PSYCH: Pleasant and cooperative. Speech normal rate and rhythm. Affect is appropriate. Insight and judgement are appropriate. Attention is focused, linear, and appropriate.  NEURO: Oriented as arrived to appointment on time with no prompting.   Lab Results  Component Value Date   CREATININE 0.98 04/26/2022   BUN 17 04/26/2022   NA 138 04/26/2022   K 3.4 (L) 04/26/2022   CL 102 04/26/2022   CO2 23 04/26/2022   Lab Results  Component Value Date   ALT 31 04/26/2022   AST 23 04/26/2022   ALKPHOS 64 04/26/2022   BILITOT 0.3 04/26/2022   Lab Results  Component Value Date   HGBA1C 5.4 04/26/2022   HGBA1C 5.4 01/19/2022   HGBA1C 5.4 11/11/2021   HGBA1C 5.9 (H) 01/15/2021   HGBA1C 5.8 (H) 07/23/2020   Lab Results  Component Value Date   INSULIN 23.5 04/26/2022   INSULIN 20.5 11/11/2021   INSULIN 12.8 01/15/2021   INSULIN 18.1 07/23/2020   INSULIN 27.0 (H) 12/19/2019   Lab Results  Component Value Date   TSH 1.340 04/10/2019   Lab Results  Component Value Date   CHOL 193 04/26/2022   HDL 58 04/26/2022   LDLCALC 116 (H) 04/26/2022   TRIG 106 04/26/2022   Lab Results  Component Value Date   WBC 4.6 04/26/2022   HGB 13.8 04/26/2022   HCT 42.7 04/26/2022   MCV 84 04/26/2022   PLT 223 04/26/2022   No results found for: "IRON", "TIBC", "FERRITIN" Lab Results  Component Value Date   VD25OH 46.4 04/26/2022   VD25OH 34.5 11/11/2021   VD25OH 35.0 05/11/2021    Attestation Statements:   Reviewed by clinician on day of visit: allergies, medications, problem list, medical history, surgical history, family history, social history, and previous encounter notes.  Time spent on visit including the items listed below was 33 minutes.  -preparing to see the  patient (e.g., review of tests, history, previous notes) -obtaining and/or reviewing separately obtained history -counseling and educating the patient/family/caregiver -documenting clinical information in the electronic or other health record

## 2022-08-04 NOTE — Progress Notes (Signed)
Chief Complaint:   OBESITY Kenneth Green is here to discuss his progress with his obesity treatment plan along with follow-up of his obesity related diagnoses. Kenneth Green is on the Category 4 Plan and states he is following his eating plan approximately 35% of the time. Kenneth Green states he is walking for 45-60 minutes 5 times per week.  Today's visit was #: 51 Starting weight: 308 lbs Starting date: 04/10/2019 Today's weight: 254 lbs Today's date: 07/21/2022 Total lbs lost to date: 14 Total lbs lost since last in-office visit: 0  Interim History: Kenneth Green has been struggling with weight gain the last few visits. He is eating out more and has had some binge eating episodes with simple carbohydrates.   Subjective:   1. Type 2 diabetes mellitus with other specified complication, unspecified whether long term insulin use (Kenneth Green) Kenneth Green notes increased polyphagia and he is struggling to follow his eating plan.   2. Vitamin D deficiency Kenneth Green is on Vitamin D, and his level is slowly improving and close to goal.   Assessment/Plan:   1. Type 2 diabetes mellitus with other specified complication, unspecified whether long term insulin use (Kenneth Green) Kenneth Green agreed to increase metformin 500 mg BID, and we will refill for 1 month.  - metFORMIN (GLUCOPHAGE) 500 MG tablet; Take 1 tablet (500 mg total) by mouth 2 (two) times daily with a meal.  Dispense: 60 tablet; Refill: 0  2. Vitamin D deficiency Kenneth Green will continue prescription Vitamin D 50,000 IU once weekly, and we will refill for 1 month.   - Vitamin D, Ergocalciferol, (DRISDOL) 1.25 MG (50000 UNIT) CAPS capsule; Take 1 capsule by mouth every 7 days.  Dispense: 4 capsule; Refill: 0  3. Obesity, Current BMI 37.6 Kenneth Green is currently in the action stage of change. As such, his goal is to continue with weight loss efforts. He has agreed to keeping a food journal and adhering to recommended goals of 1500-1800 calories and 100+ grams of protein daily.    Exercise goals: As is.   Behavioral modification strategies: decreasing eating out.  Kenneth Green has agreed to follow-up with our clinic in 3 to 4 weeks. He was informed of the importance of frequent follow-up visits to maximize his success with intensive lifestyle modifications for his multiple health conditions.   Objective:   Blood pressure 109/71, pulse 67, temperature 98 F (36.7 C), height '5\' 9"'$  (1.753 m), weight 254 lb (115.2 kg), SpO2 98 %. Body mass index is 37.51 kg/m.  General: Cooperative, alert, well developed, in no acute distress. HEENT: Conjunctivae and lids unremarkable. Cardiovascular: Regular rhythm.  Lungs: Normal work of breathing. Neurologic: No focal deficits.   Lab Results  Component Value Date   CREATININE 0.98 04/26/2022   BUN 17 04/26/2022   NA 138 04/26/2022   K 3.4 (L) 04/26/2022   CL 102 04/26/2022   CO2 23 04/26/2022   Lab Results  Component Value Date   ALT 31 04/26/2022   AST 23 04/26/2022   ALKPHOS 64 04/26/2022   BILITOT 0.3 04/26/2022   Lab Results  Component Value Date   HGBA1C 5.4 04/26/2022   HGBA1C 5.4 01/19/2022   HGBA1C 5.4 11/11/2021   HGBA1C 5.9 (H) 01/15/2021   HGBA1C 5.8 (H) 07/23/2020   Lab Results  Component Value Date   INSULIN 23.5 04/26/2022   INSULIN 20.5 11/11/2021   INSULIN 12.8 01/15/2021   INSULIN 18.1 07/23/2020   INSULIN 27.0 (H) 12/19/2019   Lab Results  Component Value Date   TSH  1.340 04/10/2019   Lab Results  Component Value Date   CHOL 193 04/26/2022   HDL 58 04/26/2022   LDLCALC 116 (H) 04/26/2022   TRIG 106 04/26/2022   Lab Results  Component Value Date   VD25OH 46.4 04/26/2022   VD25OH 34.5 11/11/2021   VD25OH 35.0 05/11/2021   Lab Results  Component Value Date   WBC 4.6 04/26/2022   HGB 13.8 04/26/2022   HCT 42.7 04/26/2022   MCV 84 04/26/2022   PLT 223 04/26/2022   No results found for: "IRON", "TIBC", "FERRITIN"  Attestation Statements:   Reviewed by clinician on day of  visit: allergies, medications, problem list, medical history, surgical history, family history, social history, and previous encounter notes.   I, Trixie Dredge, am acting as transcriptionist for Dennard Nip, MD.  I have reviewed the above documentation for accuracy and completeness, and I agree with the above. -  Dennard Nip, MD

## 2022-08-04 NOTE — Progress Notes (Signed)
[facility] Counselor/Therapist Progress Note  [name], MRN: [id number],    Date: 08/04/2022  Time Spent: 45 mins  Treatment Type: Individual Therapy  Reported Symptoms: Pt presented via webex video for follow up session, granting consent for the session.  Pt stated he was in his car and no one else was present.  I shared with pt that I am in my office and no one else is present.  Mental Status Exam: Appearance:  Casual     Behavior: Appropriate  Motor: Normal  Speech/Language:  Clear and Coherent  Affect: Appropriate  Mood: normal  Thought process: normal  Thought content:   WNL  Sensory/Perceptual disturbances:   WNL  Orientation: oriented to person, place, and time/date  Attention: Good  Concentration: Good  Memory: WNL  Fund of knowledge:  Good  Insight:   Good  Judgment:  Good  Impulse Control: Good   Risk Assessment: Danger to Self:  No Self-injurious Behavior: No Danger to Others: No Duty to Warn:no Physical Aggression / Violence:No  Access to Firearms a concern: No  Gang Involvement:No   Subjective: Pt shares that "I am tired; I have been taking [name] all over the place today.  I took her to her PCP this morning and this is the third trip to the ortho office.  She is likely to have to have another surgery on her shoulder and she is talking to a hand specialist about surgery on her hand and she has an appt Friday with the Spine surgeon."  Pt shares he only got an hour of sleep this morning after work and has only had one more hour of sleep while he has been waiting on [place] at appts.  Pt shares that [name]'s former boyfriend has been staying away but called pt at the beginning of our session; pt did not take the call.  Pt shares that he normally sleeps pretty well.  Pt shares that [name] was crying earlier today, telling pt that she knows she is a burden to him.  [name] is now connected with a therapist of her own; pt is hopeful that it  will take some of the emotional load off of pt.  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  Pt shares he now officially has the new job title but he is not doing the job and is not doing any training for the new position.  Pt is continuing to train the new hires and thinks he may have to continue to do that after he takes over the new role.  Encouraged pt to advocate for himself to get more money for the additional responsibility.  Pt is dog-sitting for [name] this weekend and he loves her dog.  They had a first birthday party for his granddaughter and that was fun for pt.  Pt shares that his mom is still doing well "tinkering around the yard."  Pt continues to work outside, mowing three yards, put the garden to rest for the season, working on his equipment, etc as self care activities.  Pt shares that he has his drinking "under control at this point.  I think I need to stop but I don't think I am willing to yet."   Pt also shares that he continues to "stress eat" and feels guilty about that.  Asked pt to consider whether or not the guilt that he feels from stress eating does anything positive for him or  not.  Encouraged pt to continue his reading of the [place].  We will revisit his stress eating and alcohol use in our follow up session in 4 wks.  Interventions: Cognitive Behavioral Therapy  Diagnosis:Adjustment disorder with mixed anxiety and depressed mood  Plan: Treatment Plan Strengths/Abilities:  Intelligent, Intuitive, Willing to participate in therapy Treatment Preferences:  Outpatient Individual Therapy Statement of Needs:  Patient is to use CBT, mindfulness and coping skills to help manage and/or decrease symptoms associated with their diagnosis. Symptoms:  Depressed/Irritable mood, worry, social withdrawal Problems Addressed:  Depressive thoughts, Sadness, Sleep issues, etc. Long Term Goals:  Pt to reduce overall level, frequency, and intensity of  the feelings of depression as evidenced by decreased irritability, negative self talk, and helpless feelings from 6 to 7 days/week to 0 to 1 days/week, per client report, for at least 3 consecutive months.  Progress: 20% Short Term Goals:  Pt to verbally express understanding of the relationship between feelings of depression and their impact on thinking patterns and behaviors.  Pt to verbalize an understanding of the role that distorted thinking plays in creating fears, excessive worry, and ruminations.  Progress: 20% Target Date:  11/17/2022 Frequency:  Bi-weekly Modality:  Cognitive Behavioral Therapy Interventions by Therapist:  Therapist will use CBT, Mindfulness exercises, Coping skills and Referrals, as needed by client. Client has verbally approved this treatment plan.  Ivan Anchors, [facility]

## 2022-08-05 ENCOUNTER — Encounter (INDEPENDENT_AMBULATORY_CARE_PROVIDER_SITE_OTHER): Payer: Self-pay | Admitting: Family Medicine

## 2022-08-05 ENCOUNTER — Telehealth (INDEPENDENT_AMBULATORY_CARE_PROVIDER_SITE_OTHER): Payer: 59 | Admitting: Family Medicine

## 2022-08-05 DIAGNOSIS — Z6837 Body mass index (BMI) 37.0-37.9, adult: Secondary | ICD-10-CM

## 2022-08-05 DIAGNOSIS — F3289 Other specified depressive episodes: Secondary | ICD-10-CM | POA: Diagnosis not present

## 2022-08-05 DIAGNOSIS — E1169 Type 2 diabetes mellitus with other specified complication: Secondary | ICD-10-CM | POA: Diagnosis not present

## 2022-08-05 DIAGNOSIS — E669 Obesity, unspecified: Secondary | ICD-10-CM | POA: Diagnosis not present

## 2022-08-05 DIAGNOSIS — Z7985 Long-term (current) use of injectable non-insulin antidiabetic drugs: Secondary | ICD-10-CM

## 2022-08-16 ENCOUNTER — Encounter (INDEPENDENT_AMBULATORY_CARE_PROVIDER_SITE_OTHER): Payer: Self-pay | Admitting: Family Medicine

## 2022-08-16 ENCOUNTER — Ambulatory Visit (INDEPENDENT_AMBULATORY_CARE_PROVIDER_SITE_OTHER): Payer: 59 | Admitting: Family Medicine

## 2022-08-16 ENCOUNTER — Other Ambulatory Visit (HOSPITAL_COMMUNITY): Payer: Self-pay

## 2022-08-16 VITALS — BP 120/79 | HR 63 | Temp 97.7°F | Ht 69.0 in | Wt 253.0 lb

## 2022-08-16 DIAGNOSIS — E1169 Type 2 diabetes mellitus with other specified complication: Secondary | ICD-10-CM | POA: Diagnosis not present

## 2022-08-16 DIAGNOSIS — F32A Depression, unspecified: Secondary | ICD-10-CM | POA: Insufficient documentation

## 2022-08-16 DIAGNOSIS — Z6837 Body mass index (BMI) 37.0-37.9, adult: Secondary | ICD-10-CM

## 2022-08-16 DIAGNOSIS — Z6841 Body Mass Index (BMI) 40.0 and over, adult: Secondary | ICD-10-CM

## 2022-08-16 DIAGNOSIS — E559 Vitamin D deficiency, unspecified: Secondary | ICD-10-CM

## 2022-08-16 DIAGNOSIS — F3289 Other specified depressive episodes: Secondary | ICD-10-CM

## 2022-08-16 DIAGNOSIS — E875 Hyperkalemia: Secondary | ICD-10-CM | POA: Diagnosis not present

## 2022-08-16 DIAGNOSIS — E669 Obesity, unspecified: Secondary | ICD-10-CM

## 2022-08-16 DIAGNOSIS — F418 Other specified anxiety disorders: Secondary | ICD-10-CM | POA: Insufficient documentation

## 2022-08-16 MED ORDER — METFORMIN HCL 500 MG PO TABS
500.0000 mg | ORAL_TABLET | Freq: Two times a day (BID) | ORAL | 0 refills | Status: DC
  Filled 2022-08-16 – 2022-08-31 (×2): qty 60, 30d supply, fill #0

## 2022-08-16 MED ORDER — VITAMIN D (ERGOCALCIFEROL) 1.25 MG (50000 UNIT) PO CAPS
50000.0000 [IU] | ORAL_CAPSULE | ORAL | 0 refills | Status: DC
  Filled 2022-08-16: qty 4, 28d supply, fill #0

## 2022-08-18 NOTE — Progress Notes (Unsigned)
Chief Complaint:   OBESITY Kenneth Green is here to discuss his progress with his obesity treatment plan along with follow-up of his obesity related diagnoses. Kenneth Green is on {MWMwtlossportion/plan2:23431} and states he is following his eating plan approximately ***% of the time. Happy states he is *** *** minutes *** times per week.  Today's visit was #: *** Starting weight: *** Starting date: *** Today's weight: *** Today's date: 08/16/2022 Total lbs lost to date: *** Total lbs lost since last in-office visit: ***  Interim History: ***  Subjective:   1. Vitamin D deficiency ***  2. Type 2 diabetes mellitus with other specified complication, unspecified whether long term insulin use (HCC) ***  3. Hyperkalemia ***  4. Other depression with emotional eating ***  Assessment/Plan:   1. Vitamin D deficiency *** - Vitamin D, Ergocalciferol, (DRISDOL) 1.25 MG (50000 UNIT) CAPS capsule; Take 1 capsule by mouth every 7 days.  Dispense: 4 capsule; Refill: 0  2. Type 2 diabetes mellitus with other specified complication, unspecified whether long term insulin use (HCC) *** - metFORMIN (GLUCOPHAGE) 500 MG tablet; Take 1 tablet (500 mg total) by mouth 2 (two) times daily with a meal.  Dispense: 60 tablet; Refill: 0  3. Hyperkalemia ***  4. Other depression with emotional eating ***  5. Obesity, Current BMI 37.4 [place] {CHL AMB IS/IS NOT:210130109} currently in the action stage of change. As such, his goal is to {MWMwtloss#1:210800005}. He has agreed to {MWMwtlossportion/plan2:23431}.   Exercise goals: {MWM EXERCISE RECS:23473}  Behavioral modification strategies: {MWMwtlossdietstrategies3:23432}.  Kenneth Green has agreed to follow-up with our clinic in {NUMBER 1-10:22536} weeks. He was informed of the importance of frequent follow-up visits to maximize his success with intensive lifestyle modifications for his multiple health conditions.   Objective:   Blood pressure 120/79,  pulse 63, temperature 97.7 F (36.5 C), height '5\' 9"'$  (1.753 m), weight 253 lb (114.8 kg), SpO2 99 %. Body mass index is 37.36 kg/m.  General: Cooperative, alert, well developed, in no acute distress. HEENT: Conjunctivae and lids unremarkable. Cardiovascular: Regular rhythm.  Lungs: Normal work of breathing. Neurologic: No focal deficits.   Lab Results  Component Value Date   CREATININE 0.98 04/26/2022   BUN 17 04/26/2022   NA 138 04/26/2022   K 3.4 (L) 04/26/2022   CL 102 04/26/2022   CO2 23 04/26/2022   Lab Results  Component Value Date   ALT 31 04/26/2022   AST 23 04/26/2022   ALKPHOS 64 04/26/2022   BILITOT 0.3 04/26/2022   Lab Results  Component Value Date   HGBA1C 5.4 04/26/2022   HGBA1C 5.4 01/19/2022   HGBA1C 5.4 11/11/2021   HGBA1C 5.9 (H) 01/15/2021   HGBA1C 5.8 (H) 07/23/2020   Lab Results  Component Value Date   INSULIN 23.5 04/26/2022   INSULIN 20.5 11/11/2021   INSULIN 12.8 01/15/2021   INSULIN 18.1 07/23/2020   INSULIN 27.0 (H) 12/19/2019   Lab Results  Component Value Date   TSH 1.340 04/10/2019   Lab Results  Component Value Date   CHOL 193 04/26/2022   HDL 58 04/26/2022   LDLCALC 116 (H) 04/26/2022   TRIG 106 04/26/2022   Lab Results  Component Value Date   VD25OH 46.4 04/26/2022   VD25OH 34.5 11/11/2021   VD25OH 35.0 05/11/2021   Lab Results  Component Value Date   WBC 4.6 04/26/2022   HGB 13.8 04/26/2022   HCT 42.7 04/26/2022   MCV 84 04/26/2022   PLT 223 04/26/2022   No  results found for: "IRON", "TIBC", "FERRITIN"  Attestation Statements:   Reviewed by clinician on day of visit: allergies, medications, problem list, medical history, surgical history, family history, social history, and previous encounter notes.   I, Trixie Dredge, am acting as transcriptionist for Dennard Nip, MD.  I have reviewed the above documentation for accuracy and completeness, and I agree with the above. -  ***

## 2022-08-23 ENCOUNTER — Other Ambulatory Visit (HOSPITAL_COMMUNITY): Payer: Self-pay

## 2022-08-31 ENCOUNTER — Ambulatory Visit: Payer: 59 | Admitting: Psychology

## 2022-08-31 ENCOUNTER — Other Ambulatory Visit (HOSPITAL_COMMUNITY): Payer: Self-pay

## 2022-09-01 ENCOUNTER — Ambulatory Visit (INDEPENDENT_AMBULATORY_CARE_PROVIDER_SITE_OTHER): Payer: 59 | Admitting: Psychology

## 2022-09-01 DIAGNOSIS — F4323 Adjustment disorder with mixed anxiety and depressed mood: Secondary | ICD-10-CM

## 2022-09-01 NOTE — Progress Notes (Signed)
[facility] Counselor/Therapist Progress Note  Patient ID: LORON WEIMER, MRN: [id number],    Date: 09/01/2022  Time Spent: 45 mins  Treatment Type: Individual Therapy  Reported Symptoms: Pt presented via webex video for follow up session, granting consent for the session.  Pt stated he was in his home and no one else was present.  I shared with pt that I am in my office and no one else is present.  Mental Status Exam: Appearance:  Casual     Behavior: Appropriate  Motor: Normal  Speech/Language:  Clear and Coherent  Affect: Appropriate  Mood: normal  Thought process: normal  Thought content:   WNL  Sensory/Perceptual disturbances:   WNL  Orientation: oriented to person, place, and time/date  Attention: Good  Concentration: Good  Memory: WNL  Fund of knowledge:  Good  Insight:   Good  Judgment:  Good  Impulse Control: Good   Risk Assessment: Danger to Self:  No Self-injurious Behavior: No Danger to Others: No Duty to Warn:no Physical Aggression / Violence:No  Access to Firearms a concern: No  Gang Involvement:No   Subjective: Pt shares that "I have been really stressed lately; home life is stressful and work life is stressful so that is my life.  [name] is having trouble with her hands, back and wrists and I am having to do a lot of things for her (he has to help her bathe, dress, get her in and out of the car, almost daily doctor's appts, etc.).  Pt shares he is truly drained emotionally by this situation.  "I have also been stress eating because of all of this and then I get down on myself."  Pt shares that he has been given the new job title but he is not able to do the job yet because they still have him doing his previous job.  Pt has been talking with the HR rep at work to find a way to fix the money and the different duties that have been added to the original job description.  Pt shares that he continues to be stressed by everything that [name] needs  him to be and do for her.  Talked with pt about ways to talk with [name] and ask her for her help in being supportive of what he does for her.  Pt shares that he is going to a weekend event, leaving this afternoon until Sunday.  He also did a re-enactment last weekend as well and he really enjoyed that event.  Pt also took this week off from work between those two weekend events.  Pt shares that [name] has an appt coming up with a Pain Mgmt provider soon and pt is hopeful that might help her.  Pt shares that he has been drinking less recently; he has also continued reading the [place]; he is also getting connected with a 12 step group for people with eating issues and they follow the [place].  Encouraged pt to continue his reading of the [place].  We will revisit his stress eating and alcohol use in our follow up session in 3 wks.  Interventions: Cognitive Behavioral Therapy  Diagnosis:Adjustment disorder with mixed anxiety and depressed mood  Plan: Treatment Plan Strengths/Abilities:  Intelligent, Intuitive, Willing to participate in therapy Treatment Preferences:  Outpatient Individual Therapy Statement of Needs:  Patient is to use CBT, mindfulness and coping skills to help manage and/or decrease symptoms associated with their diagnosis. Symptoms:  Depressed/Irritable mood, worry,  social withdrawal Problems Addressed:  Depressive thoughts, Sadness, Sleep issues, etc. Long Term Goals:  Pt to reduce overall level, frequency, and intensity of the feelings of depression as evidenced by decreased irritability, negative self talk, and helpless feelings from 6 to 7 days/week to 0 to 1 days/week, per client report, for at least 3 consecutive months.  Progress: 20% Short Term Goals:  Pt to verbally express understanding of the relationship between feelings of depression and their impact on thinking patterns and behaviors.  Pt to verbalize an understanding of the role that distorted thinking plays in  creating fears, excessive worry, and ruminations.  Progress: 20% Target Date:  11/17/2022 Frequency:  Bi-weekly Modality:  Cognitive Behavioral Therapy Interventions by Therapist:  Therapist will use CBT, Mindfulness exercises, Coping skills and Referrals, as needed by client. Client has verbally approved this treatment plan.  Ivan Anchors, [facility]

## 2022-09-08 ENCOUNTER — Encounter: Payer: Self-pay | Admitting: Behavioral Health

## 2022-09-08 ENCOUNTER — Ambulatory Visit (INDEPENDENT_AMBULATORY_CARE_PROVIDER_SITE_OTHER): Payer: 59 | Admitting: Behavioral Health

## 2022-09-08 DIAGNOSIS — F4323 Adjustment disorder with mixed anxiety and depressed mood: Secondary | ICD-10-CM

## 2022-09-08 DIAGNOSIS — F331 Major depressive disorder, recurrent, moderate: Secondary | ICD-10-CM

## 2022-09-08 DIAGNOSIS — F411 Generalized anxiety disorder: Secondary | ICD-10-CM

## 2022-09-08 DIAGNOSIS — F3289 Other specified depressive episodes: Secondary | ICD-10-CM

## 2022-09-08 MED ORDER — SERTRALINE HCL 50 MG PO TABS: 50.0000 mg | ORAL_TABLET | Freq: Every day | ORAL | 1 refills | Status: DC

## 2022-09-08 MED ORDER — BUPROPION HCL ER (SR) 200 MG PO TB12: ORAL_TABLET | ORAL | 1 refills | Status: DC

## 2022-09-08 NOTE — Progress Notes (Signed)
[facility]  [name],  MRN: [id number]  PCP: Cari Caraway, MD  Date of Evaluation: 09/08/2022 Time spent:30 minutes  Chief Complaint:  Chief Complaint   Anxiety; Depression; Follow-up; Medication Refill; Medication Problem; Patient Education     HISTORY/CURRENT STATUS: HPI 56 year old male presents to this office for follow up and medication management. He is calm but flat affect. Says he started to get mentally foggy with increase of Zoloft. Says his wife was concerned about him. He reduced the dose and symptoms resolved. He will stay at 50 mg for now and will see how things go over the next couple of months. He is continuing psychotherapy and says that he has convinced his wife to get therapy as well. He reports his anxiety today at 3/10  and depression at 3/10. Says he is sleeping 7-8 hours nightly. Denies mania, no psychosis. No SI/HI.    No Past medication failures  Individual Medical History/ Review of Systems: Changes? :No   Allergies: Iohexol  Current Medications:  Current Outpatient Medications:    benazepril-hydrochlorthiazide (LOTENSIN HCT) 10-12.5 MG tablet, Take 1 tablet by mouth 2 (two) times daily., Disp: 60 tablet, Rfl: 0   buPROPion (WELLBUTRIN SR) 200 MG 12 hr tablet, Take one tablet twice daily (200 mg total)., Disp: 180 tablet, Rfl: 1   fexofenadine (ALLEGRA) 180 MG tablet, Take 180 mg by mouth daily., Disp: , Rfl:    metFORMIN (GLUCOPHAGE) 500 MG tablet, Take 1 tablet (500 mg total) by mouth 2 (two) times daily with a meal., Disp: 60 tablet, Rfl: 0   pregabalin (LYRICA) 150 MG capsule, Take 150 mg by mouth 2 (two) times daily., Disp: , Rfl:    sertraline (ZOLOFT) 50 MG tablet, Take 1 tablet (50 mg total) by mouth daily., Disp: 90 tablet, Rfl: 1   Travoprost, BAK Free, (TRAVATAN) 0.004 % SOLN ophthalmic solution, Place 1 drop into both eyes at bedtime., Disp: , Rfl:    Vitamin D, Ergocalciferol, (DRISDOL) 1.25 MG (50000 UNIT)  CAPS capsule, Take 1 capsule by mouth every 7 days., Disp: 4 capsule, Rfl: 0 Medication Side Effects: none  Family Medical/ Social History: Changes? No  MENTAL HEALTH EXAM:  There were no vitals taken for this visit.There is no height or weight on file to calculate BMI.  General Appearance: Casual, Neat, and Well Groomed  Eye Contact:  Good  Speech:  Clear and Coherent  Volume:  Normal  Mood:  NA  Affect:  Appropriate  Thought Process:  Coherent  Orientation:  Full (Time, Place, and Person)  Thought Content: Logical   Suicidal Thoughts:  No  Homicidal Thoughts:  No  Memory:  WNL  Judgement:  Good  Insight:  Good  Psychomotor Activity:  Normal  Concentration:  Concentration: Good  Recall:  Good  Fund of Knowledge: Good  Language: Good  Assets:  Desire for Improvement  ADL's:  Intact  Cognition: WNL  Prognosis:  Good    DIAGNOSES:    ICD-10-CM   1. Generalized anxiety disorder  F41.1 sertraline (ZOLOFT) 50 MG tablet    2. Adjustment disorder with mixed anxiety and depressed mood  F43.23 sertraline (ZOLOFT) 50 MG tablet    3. Major depressive disorder, recurrent episode, moderate (HCC)  F33.1 sertraline (ZOLOFT) 50 MG tablet    4. Other depression with emotional eating  F32.89 buPROPion (WELLBUTRIN SR) 200 MG 12 hr tablet      Receiving Psychotherapy: No    RECOMMENDATIONS:   Greater than  50% of 30  min face to face time with patient was spent on counseling and coordination of care.  Pt says that when he increased his Zoloft to 100 mg he started to get mentally foggy. So he reduced back to 50 mg and symptoms resolved. He does not want to make any further changes this visit but will continue to monitor anxiety and depression closely.   We discussed his significant improvement with depression and anxiety. 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 We discussed the  following plan: To reduce Zoloft to 50 mg daily Continue Wellbutrin 200 mg SR two times daily To report side effects or worsening symptoms promptly Provided emergency contact information To follow up in 3 months to reassess. Reviewed PDMP       Elwanda Brooklyn, NP

## 2022-09-13 ENCOUNTER — Encounter (INDEPENDENT_AMBULATORY_CARE_PROVIDER_SITE_OTHER): Payer: Self-pay | Admitting: Family Medicine

## 2022-09-13 ENCOUNTER — Ambulatory Visit (INDEPENDENT_AMBULATORY_CARE_PROVIDER_SITE_OTHER): Payer: 59 | Admitting: Family Medicine

## 2022-09-13 VITALS — BP 174/94 | HR 58 | Temp 97.6°F | Ht 69.0 in | Wt 262.0 lb

## 2022-09-13 DIAGNOSIS — E1169 Type 2 diabetes mellitus with other specified complication: Secondary | ICD-10-CM | POA: Diagnosis not present

## 2022-09-13 DIAGNOSIS — E782 Mixed hyperlipidemia: Secondary | ICD-10-CM

## 2022-09-13 DIAGNOSIS — F3289 Other specified depressive episodes: Secondary | ICD-10-CM

## 2022-09-13 DIAGNOSIS — Z7984 Long term (current) use of oral hypoglycemic drugs: Secondary | ICD-10-CM

## 2022-09-13 DIAGNOSIS — Z6838 Body mass index (BMI) 38.0-38.9, adult: Secondary | ICD-10-CM

## 2022-09-13 DIAGNOSIS — I10 Essential (primary) hypertension: Secondary | ICD-10-CM

## 2022-09-13 DIAGNOSIS — E559 Vitamin D deficiency, unspecified: Secondary | ICD-10-CM

## 2022-09-13 DIAGNOSIS — E669 Obesity, unspecified: Secondary | ICD-10-CM

## 2022-09-13 MED ORDER — METFORMIN HCL 500 MG PO TABS: 500.0000 mg | ORAL_TABLET | Freq: Two times a day (BID) | ORAL | 0 refills | Status: DC

## 2022-09-13 MED ORDER — VITAMIN D (ERGOCALCIFEROL) 1.25 MG (50000 UNIT) PO CAPS: 50000.0000 [IU] | ORAL_CAPSULE | ORAL | 0 refills | Status: DC

## 2022-09-13 MED ORDER — BENAZEPRIL-HYDROCHLOROTHIAZIDE 10-12.5 MG PO TABS: 1.0000 | ORAL_TABLET | Freq: Two times a day (BID) | ORAL | 0 refills | Status: DC

## 2022-09-15 LAB — CMP14+EGFR
ALT: 30 IU/L (ref 0–44)
AST: 21 IU/L (ref 0–40)
Albumin/Globulin Ratio: 1.9 (ref 1.2–2.2)
Albumin: 4.5 g/dL (ref 3.8–4.9)
Alkaline Phosphatase: 65 IU/L (ref 44–121)
BUN/Creatinine Ratio: 14 (ref 9–20)
BUN: 16 mg/dL (ref 6–24)
Bilirubin Total: 0.4 mg/dL (ref 0.0–1.2)
CO2: 19 mmol/L — ABNORMAL LOW (ref 20–29)
Calcium: 9.4 mg/dL (ref 8.7–10.2)
Chloride: 104 mmol/L (ref 96–106)
Creatinine, Ser: 1.11 mg/dL (ref 0.76–1.27)
Globulin, Total: 2.4 g/dL (ref 1.5–4.5)
Glucose: 96 mg/dL (ref 70–99)
Potassium: 4.4 mmol/L (ref 3.5–5.2)
Sodium: 139 mmol/L (ref 134–144)
Total Protein: 6.9 g/dL (ref 6.0–8.5)
eGFR: 77 mL/min/{1.73_m2} (ref >59)

## 2022-09-15 LAB — LIPID PANEL WITH LDL/HDL RATIO
Cholesterol, Total: 177 mg/dL (ref 100–199)
HDL: 52 mg/dL (ref >39)
LDL Chol Calc (NIH): 105 mg/dL — ABNORMAL HIGH (ref 0–99)
LDL/HDL Ratio: 2 ratio (ref 0.0–3.6)
Triglycerides: 111 mg/dL (ref 0–149)
VLDL Cholesterol Cal: 20 mg/dL (ref 5–40)

## 2022-09-15 LAB — HEMOGLOBIN A1C
Est. average glucose Bld gHb Est-mCnc: 108 mg/dL
Hgb A1c MFr Bld: 5.4 % (ref 4.8–5.6)

## 2022-09-15 LAB — VITAMIN D 25 HYDROXY (VIT D DEFICIENCY, FRACTURES): Vit D, 25-Hydroxy: 41 ng/mL (ref 30.0–100.0)

## 2022-09-15 LAB — VITAMIN B12: Vitamin B-12: 408 pg/mL (ref 232–1245)

## 2022-09-15 LAB — INSULIN, RANDOM: INSULIN: 10.2 u[IU]/mL (ref 2.6–24.9)

## 2022-09-19 NOTE — Progress Notes (Unsigned)
Chief Complaint:   OBESITY Kenneth Green is here to discuss his progress with his obesity treatment plan along with follow-up of his obesity related diagnoses. Kenneth Green is on keeping a food journal and adhering to recommended goals of 1500-1800 calories and 100+ grams of protein daily and states he is following his eating plan approximately 70% of the time. Kenneth Green states he is walking for 75 minutes 5 times per week.  Today's visit was #: 53 Starting weight: 308 lbs Starting date: 04/10/2019 Today's weight: 262 lbs Today's date: 09/13/2022 Total lbs lost to date: 46 Total lbs lost since last in-office visit: 0  Interim History: Kenneth Green is dealing with a lot of stress at work and at home. 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    Subjective:   1. Type 2 diabetes mellitus with other specified complication, unspecified whether long term insulin use (HCC) Kenneth Green is due for labs, and he is struggling with his diet. He denies hypoglycemia.   2. Essential hypertension Kenneth Green's blood pressure is elevated, and this may be due to stress. He may also be related to his his weight loss.   3. Mixed hyperlipidemia Kenneth Green is on a low cholesterol diet, and he is due for labs.   4. Vitamin D deficiency Kenneth Green is on Vitamin, and he is due for labs.   5. Other depression Kenneth Green is seeing a psychiatrist. He had his Zoloft was decreased as it made him feel slow and sluggish mentally. He is still on the Wellbutrin.   Assessment/Plan:   1. Type 2 diabetes mellitus with other specified complication, unspecified whether long term insulin use (HCC) We will check labs today, and we will refill metformin for 1 month.   - metFORMIN (GLUCOPHAGE) 500 MG tablet; Take 1 tablet (500 mg total) by mouth 2 (two) times daily with a meal.  Dispense: 60 tablet; Refill: 0 - Hemoglobin A1c - Insulin, random - Vitamin B12  2. Essential hypertension We will check labs  today, and we will refill benazepril-hydrochlorothiazide for 1 month. Kenneth Green will get back to weight loss.   - benazepril-hydrochlorthiazide (LOTENSIN HCT) 10-12.5 MG tablet; Take 1 tablet by mouth 2 (two) times daily.  Dispense: 60 tablet; Refill: 0 - CMP14+EGFR  3. Mixed hyperlipidemia We will check labs today. Kenneth Green will continue to work on diet, exercise, and weight loss efforts. Orders and follow up as documented in patient record.   - Lipid Panel With LDL/HDL Ratio  4. Vitamin D deficiency We will check labs today, and we will refill prescription Vitamin D for 1 month. Kenneth Green will follow-up for routine testing of Vitamin D, at least 2-3 times per year to avoid over-replacement.  - Vitamin D, Ergocalciferol, (DRISDOL) 1.25 MG (50000 UNIT) CAPS capsule; Take 1 capsule by mouth every 7 days.  Dispense: 4 capsule; Refill: 0 - VITAMIN D 25 Hydroxy (Vit-D Deficiency, Fractures)  5. Other depression Kenneth Green will continue to work on his stress techniques.   6. Obesity, Current BMI 38.8 Kenneth Green is currently in the action stage of change. As such, his goal is to maintain weight for now. He has agreed to keeping a food journal and adhering to recommended goals of 1500-1800 calories and 100+ grams of protein daily.   Kenneth Green's goal is to maintain his weight while dealing with his life stressors.   Exercise goals: As is.   Behavioral modification strategies: increasing lean protein intake, meal planning and cooking strategies, and emotional eating strategies.  Kenneth Green has agreed to follow-up with our clinic in 4 weeks. He was informed of the importance of frequent follow-up visits to maximize his success with intensive lifestyle modifications for his multiple health conditions.   Kenneth Green was informed we would discuss his lab results at his next visit unless there is a critical issue that needs to be addressed sooner. Kenneth Green agreed to keep his next visit at the agreed upon time to discuss  these results.  Objective:   Blood pressure (!) 174/94, pulse (!) 58, temperature 97.6 F (36.4 C), height _0  (1.753 m), weight 262 lb (118.8 kg), SpO2 100 %. Body mass index is 38.69 kg/m.  General: Cooperative, alert, well developed, in no acute distress. HEENT: Conjunctivae and lids unremarkable. Cardiovascular: Regular rhythm.  Lungs: Normal work of breathing. Neurologic: No focal deficits.   Lab Results  Component Value Date   CREATININE 1.11 09/13/2022   BUN 16 09/13/2022   NA 139 09/13/2022   K 4.4 09/13/2022   CL 104 09/13/2022   CO2 19 (L) 09/13/2022   Lab Results  Component Value Date   ALT 30 09/13/2022   AST 21 09/13/2022   ALKPHOS 65 09/13/2022   BILITOT 0.4 09/13/2022   Lab Results  Component Value Date   HGBA1C 5.4 09/13/2022   HGBA1C 5.4 04/26/2022   HGBA1C 5.4 01/19/2022   HGBA1C 5.4 11/11/2021   HGBA1C 5.9 (H) 01/15/2021   Lab Results  Component Value Date   INSULIN 10.2 09/13/2022   INSULIN 23.5 04/26/2022   INSULIN 20.5 11/11/2021   INSULIN 12.8 01/15/2021   INSULIN 18.1 07/23/2020   Lab Results  Component Value Date   TSH 1.340 04/10/2019   Lab Results  Component Value Date   CHOL 177 09/13/2022   HDL 52 09/13/2022   LDLCALC 105 (H) 09/13/2022   TRIG 111 09/13/2022   Lab Results  Component Value Date   VD25OH 41.0 09/13/2022   VD25OH 46.4 04/26/2022   VD25OH 34.5 11/11/2021   Lab Results  Component Value Date   WBC 4.6 04/26/2022   HGB 13.8 04/26/2022   HCT 42.7 04/26/2022   MCV 84 04/26/2022   PLT 223 04/26/2022   No results found for: "IRON", "TIBC", "FERRITIN"  Attestation Statements:   Reviewed by clinician on day of visit: allergies, medications, problem list, medical history, surgical history, family history, social history, and previous encounter notes.  Time spent on visit including pre-visit chart review and post-visit care and charting was 45 minutes.   I, Trixie Dredge, am acting as transcriptionist for  Dennard Nip, MD.  I have reviewed the above documentation for accuracy and completeness, and I agree with the above. -  ***

## 2022-09-21 ENCOUNTER — Ambulatory Visit: Payer: 59 | Admitting: Psychology

## 2022-09-21 DIAGNOSIS — F4323 Adjustment disorder with mixed anxiety and depressed mood: Secondary | ICD-10-CM | POA: Diagnosis not present

## 2022-09-21 NOTE — Progress Notes (Signed)
[facility] Counselor/Therapist Progress Note  Patient ID: LAFE CLERK, MRN: [id number],    Date: 09/21/2022  Time Spent: 45 mins  Treatment Type: Individual Therapy  Reported Symptoms: Pt presented via webex video for follow up session, granting consent for the session.  Pt stated he was in his home and no one else was present.  I shared with pt that I am in my office and no one else is present.  Mental Status Exam: Appearance:  Casual     Behavior: Appropriate  Motor: Normal  Speech/Language:  Clear and Coherent  Affect: Appropriate  Mood: normal  Thought process: normal  Thought content:   WNL  Sensory/Perceptual disturbances:   WNL  Orientation: oriented to person, place, and time/date  Attention: Good  Concentration: Good  Memory: WNL  Fund of knowledge:  Good  Insight:   Good  Judgment:  Good  Impulse Control: Good   Risk Assessment: Danger to Self:  No Self-injurious Behavior: No Danger to Others: No Duty to Warn:no Physical Aggression / Violence:No  Access to Firearms a concern: No  Gang Involvement:No   Subjective: Pt shares that "It is OK.  I have finally started my new position but I have not yet had any training for it.  That is starting to work itself out slowly now.  [name] is much worse.  She will have to have both shoulders revised, the first one before the end of the year.  I am still taking her to all of her appts."  Pt shares he may get to spend a day or two with the other guy who does the job during the day; he will shadow that guy.  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  He was off work on Friday to go to a HS band reunion and then went to [place] near [place], [place] with his sister and brother-in-law.  Pt has not worked a full week in Oct and will not work a full week in Nov due to weekend plans and Thanksgiving.  He also has knee surgery scheduled for 11/30 and he plans to  be out of work for about 5 days.  His new job is more sedentary than before, so he thinks he will be OK.  He is not able to focus on his weight loss at this time so he is trying to to worry about that.  Pt continues to stress eat and shares that he continues to do so without even thinking.  We talked about making the best choices that he can when he is able to do so.  [name] is talking to him about getting involved with an overeating group that follows the 12 step model.  He went to [place], [place] on Sunday to see his friend who has cancer; they enjoyed their visit.  They will both be at the re-enactment event in [place], [place] in 2 wks.  Pt has been talking with the HR rep at work to find a way to fix the money and the different duties that have been added to the original job description; she is supportive of him getting more money.  [name] is getting more depressed about her ongoing health issues and that is hard for pt as well.  Pt shares that his daughter is feeling much better without her old boyfriend being around.  Talked with pt about considering getting someone to  come in the home a couple of days a week to let him get more rest during the week.  Pt shows me a photo of his granddaughter and also says his mom is doing well too.  Pt shares he has not had opportunity to read much of the Big Book lately because of his weekend plans; encouraged pt to look for opportunities to read when he can.  We will meet in 2 wks for a follow up session.  Interventions: Cognitive Behavioral Therapy  Diagnosis:Adjustment disorder with mixed anxiety and depressed mood  Plan: Treatment Plan Strengths/Abilities:  Intelligent, Intuitive, Willing to participate in therapy Treatment Preferences:  Outpatient Individual Therapy Statement of Needs:  Patient is to use CBT, mindfulness and coping skills to help manage and/or decrease symptoms associated with their diagnosis. Symptoms:  Depressed/Irritable mood, worry, social  withdrawal Problems Addressed:  Depressive thoughts, Sadness, Sleep issues, etc. Long Term Goals:  Pt to reduce overall level, frequency, and intensity of the feelings of depression as evidenced by decreased irritability, negative self talk, and helpless feelings from 6 to 7 days/week to 0 to 1 days/week, per client report, for at least 3 consecutive months.  Progress: 20% Short Term Goals:  Pt to verbally express understanding of the relationship between feelings of depression and their impact on thinking patterns and behaviors.  Pt to verbalize an understanding of the role that distorted thinking plays in creating fears, excessive worry, and ruminations.  Progress: 20% Target Date:  11/17/2022 Frequency:  Bi-weekly Modality:  Cognitive Behavioral Therapy Interventions by Therapist:  Therapist will use CBT, Mindfulness exercises, Coping skills and Referrals, as needed by client. Client has verbally approved this treatment plan.  Ivan Anchors, [facility]

## 2022-10-08 ENCOUNTER — Ambulatory Visit: Payer: 59 | Admitting: Psychology

## 2022-10-08 DIAGNOSIS — F4323 Adjustment disorder with mixed anxiety and depressed mood: Secondary | ICD-10-CM | POA: Diagnosis not present

## 2022-10-08 NOTE — Progress Notes (Signed)
[facility] Counselor/Therapist Progress Note  [name], MRN: [id number],    Date: 10/08/2022  Time Spent: 45 mins  Treatment Type: Individual Therapy  Reported Symptoms: Pt presented via webex video for follow up session, granting consent for the session.  Pt stated he was in his home and no one else was present.  I shared with pt that I am in my office and no one else is present.  Mental Status Exam: Appearance:  Casual     Behavior: Appropriate  Motor: Normal  Speech/Language:  Clear and Coherent  Affect: Appropriate  Mood: normal  Thought process: normal  Thought content:   WNL  Sensory/Perceptual disturbances:   WNL  Orientation: oriented to person, place, and time/date  Attention: Good  Concentration: Good  Memory: WNL  Fund of knowledge:  Good  Insight:   Good  Judgment:  Good  Impulse Control: Good   Risk Assessment: Danger to Self:  No Self-injurious Behavior: No Danger to Others: No Duty to Warn:no Physical Aggression / Violence:No  Access to Firearms a concern: No  Gang Involvement:No   Subjective: Pt shares that "It is OK.  I took [name] to the Pain Mgmt doctor this morning and that was helpful to have better understanding of the nature of her pain (muscular, neurological, etc.).  Work is getting a little more difficult; my boss got a little snippy with me and I told him I was having to do the work of 3 jobs (training, reporting, his new job) and I only have the time of one person.  My boss is being asked to do extra work as well so he is frustrated as well."  Pt shares that his boss backed off of pt after he told them that he has too much to do in the time he has to do it.  With [name]'s health getting worse, he is overworked at work and at home.  He is not sure how to succeed at home or at work.  Pt shares that he is also eating poorly and too much and he shares he "should have more control of my eating, work, and Clinical cytogeneticist."  We  talked about the power of the word 'should' and how that makes it almost impossible for [place] to succeed.  Pt has knee surgery on 11/30 and [name]'s left shoulder 2nd surgery is 12/7.  Pt went to a [place] and [employer] in [place] "and that was great.  I also had a great time at the [place] re-enactment and I went [place] with my sister and her husband that was a lot of fun."  Encouraged pt to talk with his bosses at work about clarifying his responsibilities and talking to [place] about allowing someone else to come in to help her.  Pt shares he has not had opportunity to read much of the Big Book lately because of his weekend plans; encouraged pt to look for opportunities to read when he can.  We will meet in 3 wks for a follow up session.  Interventions: Cognitive Behavioral Therapy  Diagnosis:Adjustment disorder with mixed anxiety and depressed mood  Plan: Treatment Plan Strengths/Abilities:  Intelligent, Intuitive, Willing to participate in therapy Treatment Preferences:  Outpatient Individual Therapy Statement of Needs:  Patient is to use CBT, mindfulness and coping skills to help manage and/or decrease symptoms associated with their diagnosis. Symptoms:  Depressed/Irritable mood, worry, social withdrawal Problems Addressed:  Depressive thoughts, Sadness, Sleep issues, etc. Long Term  Goals:  Pt to reduce overall level, frequency, and intensity of the feelings of depression as evidenced by decreased irritability, negative self talk, and helpless feelings from 6 to 7 days/week to 0 to 1 days/week, per client report, for at least 3 consecutive months.  Progress: 20% Short Term Goals:  Pt to verbally express understanding of the relationship between feelings of depression and their impact on thinking patterns and behaviors.  Pt to verbalize an understanding of the role that distorted thinking plays in creating fears, excessive worry, and ruminations.  Progress: 20% Target Date:   11/17/2022 Frequency:  Bi-weekly Modality:  Cognitive Behavioral Therapy Interventions by Therapist:  Therapist will use CBT, Mindfulness exercises, Coping skills and Referrals, as needed by client. Client has verbally approved this treatment plan.  Ivan Anchors, [facility]

## 2022-10-11 ENCOUNTER — Encounter (INDEPENDENT_AMBULATORY_CARE_PROVIDER_SITE_OTHER): Payer: Self-pay

## 2022-10-11 ENCOUNTER — Encounter (INDEPENDENT_AMBULATORY_CARE_PROVIDER_SITE_OTHER): Payer: Self-pay | Admitting: Family Medicine

## 2022-10-11 ENCOUNTER — Ambulatory Visit (INDEPENDENT_AMBULATORY_CARE_PROVIDER_SITE_OTHER): Payer: Self-pay | Admitting: Family Medicine

## 2022-10-15 ENCOUNTER — Other Ambulatory Visit (INDEPENDENT_AMBULATORY_CARE_PROVIDER_SITE_OTHER): Payer: Self-pay | Admitting: Family Medicine

## 2022-10-15 DIAGNOSIS — E1169 Type 2 diabetes mellitus with other specified complication: Secondary | ICD-10-CM

## 2022-10-27 ENCOUNTER — Ambulatory Visit (INDEPENDENT_AMBULATORY_CARE_PROVIDER_SITE_OTHER): Payer: 59 | Admitting: Psychology

## 2022-10-27 DIAGNOSIS — F4323 Adjustment disorder with mixed anxiety and depressed mood: Secondary | ICD-10-CM

## 2022-10-27 NOTE — Progress Notes (Signed)
[place] Counselor/Therapist Progress Note  [name], MRN: [id number],    Date: 10/27/2022  Time Spent: 45 mins  Treatment Type: Individual Therapy  Reported Symptoms: Pt presented via webex video for follow up session, granting consent for the session.  Pt stated he was in his home and no one else was present.  I shared with pt that I am in my office and no one else is present.  Mental Status Exam: Appearance:  Casual     Behavior: Appropriate  Motor: Normal  Speech/Language:  Clear and Coherent  Affect: Appropriate  Mood: normal  Thought process: normal  Thought content:   WNL  Sensory/Perceptual disturbances:   WNL  Orientation: oriented to person, place, and time/date  Attention: Good  Concentration: Good  Memory: WNL  Fund of knowledge:  Good  Insight:   Good  Judgment:  Good  Impulse Control: Good   Risk Assessment: Danger to Self:  No Self-injurious Behavior: No Danger to Others: No Duty to Warn:no Physical Aggression / Violence:No  Access to Firearms a concern: No  Gang Involvement:No   Subjective: Pt shares that "I am recovering from my knee procedure and it went like it was supposed to go.  I have a little bit of pain but it is not bad."  [name] is having her second surgery on her left shoulder tomorrow (first one was 8 yrs ago).  [name] has been back to the Pain Mgmt doctor but she is saying that they are not helping her.  Pt shares he returned to work on Tuesday of this week; he has had to keep is knee straight but the doctor told him today is begin to use it lightly and it is OK to begin to bend it.  Pt continues to take care of [name] daily as well.  Pt shares that he did have a conversation with his boss about what he should be focusing on during his work day.  His boss was not sure about what he had been told/not told to focus on; they are making changes that are beneficial for pt in his work day.  Asked pt about his eating  program since our last session; "I have been terrible; I keep thinking that today will be the day I will start eating better.  I felt great when I got down to the weight I was in HS; I am resigned to the fact that [name] will never get better and I don't know how to live my life this way."  Pt shares that his daughter is feeling better without her old boyfriend; he recently moved to [place] and pt is thankful for that.  Pt shares that he has continued to do events with re-enactments since our last session.  Encouraged pt to fully engage in his re-enactments (1st and 2nd weekends of Jan).  Pt shares he has not had opportunity to read much of the Big Book lately because of his weekend plans; encouraged pt to look for opportunities to read when he can.  We will meet in 3 wks for a follow up session.  Interventions: Cognitive Behavioral Therapy  Diagnosis:Adjustment disorder with mixed anxiety and depressed mood  Plan: Treatment Plan Strengths/Abilities:  Intelligent, Intuitive, Willing to participate in therapy Treatment Preferences:  Outpatient Individual Therapy Statement of Needs:  Patient is to use CBT, mindfulness and coping skills to help manage and/or decrease symptoms associated with their diagnosis. Symptoms:  Depressed/Irritable mood, worry, social withdrawal Problems Addressed:  Depressive thoughts, Sadness, Sleep issues, etc. Long Term Goals:  Pt to reduce overall level, frequency, and intensity of the feelings of depression as evidenced by decreased irritability, negative self talk, and helpless feelings from 6 to 7 days/week to 0 to 1 days/week, per client report, for at least 3 consecutive months.  Progress: 20% Short Term Goals:  Pt to verbally express understanding of the relationship between feelings of depression and their impact on thinking patterns and behaviors.  Pt to verbalize an understanding of the role that distorted thinking plays in creating fears, excessive worry, and ruminations.   Progress: 20% Target Date:  11/17/2022 Frequency:  Bi-weekly Modality:  Cognitive Behavioral Therapy Interventions by Therapist:  Therapist will use CBT, Mindfulness exercises, Coping skills and Referrals, as needed by client. Client has verbally approved this treatment plan.  Ivan Anchors, [facility]

## 2022-11-01 ENCOUNTER — Encounter (INDEPENDENT_AMBULATORY_CARE_PROVIDER_SITE_OTHER): Payer: Self-pay | Admitting: Family Medicine

## 2022-11-01 ENCOUNTER — Ambulatory Visit (INDEPENDENT_AMBULATORY_CARE_PROVIDER_SITE_OTHER): Payer: 59 | Admitting: Family Medicine

## 2022-11-01 VITALS — BP 146/82 | HR 70 | Temp 97.8°F | Ht 69.0 in | Wt 271.0 lb

## 2022-11-01 DIAGNOSIS — Z7984 Long term (current) use of oral hypoglycemic drugs: Secondary | ICD-10-CM

## 2022-11-01 DIAGNOSIS — E669 Obesity, unspecified: Secondary | ICD-10-CM

## 2022-11-01 DIAGNOSIS — E1169 Type 2 diabetes mellitus with other specified complication: Secondary | ICD-10-CM

## 2022-11-01 DIAGNOSIS — Z6841 Body Mass Index (BMI) 40.0 and over, adult: Secondary | ICD-10-CM

## 2022-11-01 DIAGNOSIS — I10 Essential (primary) hypertension: Secondary | ICD-10-CM

## 2022-11-01 DIAGNOSIS — E559 Vitamin D deficiency, unspecified: Secondary | ICD-10-CM | POA: Diagnosis not present

## 2022-11-01 DIAGNOSIS — E66813 Obesity, class 3: Secondary | ICD-10-CM

## 2022-11-02 MED ORDER — METFORMIN HCL 500 MG PO TABS: 500.0000 mg | ORAL_TABLET | Freq: Two times a day (BID) | ORAL | 1 refills | Status: DC

## 2022-11-02 MED ORDER — BENAZEPRIL-HYDROCHLOROTHIAZIDE 10-12.5 MG PO TABS: 1.0000 | ORAL_TABLET | Freq: Two times a day (BID) | ORAL | 1 refills | Status: DC

## 2022-11-02 MED ORDER — VITAMIN D (ERGOCALCIFEROL) 1.25 MG (50000 UNIT) PO CAPS: 50000.0000 [IU] | ORAL_CAPSULE | ORAL | 1 refills | Status: DC

## 2022-11-17 ENCOUNTER — Ambulatory Visit (INDEPENDENT_AMBULATORY_CARE_PROVIDER_SITE_OTHER): Payer: 59 | Admitting: Psychology

## 2022-11-17 DIAGNOSIS — F4323 Adjustment disorder with mixed anxiety and depressed mood: Secondary | ICD-10-CM | POA: Diagnosis not present

## 2022-11-17 NOTE — Progress Notes (Signed)
Chief Complaint:   OBESITY Kenneth Green is here to discuss his progress with his obesity treatment plan along with follow-up of his obesity related diagnoses. Kenneth Green is on keeping a food journal and adhering to recommended goals of 1500-1800 calories and 100+ grams of protein daily and states he is following his eating plan approximately 80% of the time. Kenneth Green states he is walking for 60 minutes 5 times per week.  Today's visit was #: 66 Starting weight: 308 lbs Starting date: 04/10/2019 Today's weight: 271 lbs Today's date: 11/01/2022 Total lbs lost to date: 37 Total lbs lost since last in-office visit: 0  Interim History: Kenneth Green is recovering from surgery, and he is retaining some fluid.  He has gone up and really weight however, and he is frustrated and he is losing his motivation.  Subjective:   1. Essential hypertension Kenneth Green's blood pressure is elevated, and he is dealing with increased work and home stress and increased pain in his knee after surgery.  2. Vitamin D deficiency Kenneth Green is stable on vitamin D with no side effects noted.  He requests a refill today.  3. Type 2 diabetes mellitus with other specified complication, unspecified whether long term insulin use (HCC) Kenneth Green is struggling with increased snacking and carbohydrate cravings.  No side effects were noted with metformin.  Assessment/Plan:   1. Essential hypertension Kenneth Green Kenneth Green continue Lotensin HCT 10-12.5 mg twice daily, and we Kenneth Green refill for 2 months.  We Kenneth Green recheck his blood pressure in 1 month.  - benazepril-hydrochlorthiazide (LOTENSIN HCT) 10-12.5 MG tablet; Take 1 tablet by mouth 2 (two) times daily.  Dispense: 60 tablet; Refill: 1  2. Vitamin D deficiency Kenneth Green Kenneth Green continue prescription vitamin D 50,000 IU every week, and we Kenneth Green refill for 2 months.  - Vitamin D, Ergocalciferol, (DRISDOL) 1.25 MG (50000 UNIT) CAPS capsule; Take 1 capsule by mouth every 7 days.  Dispense: 4 capsule;  Refill: 1  3. Type 2 diabetes mellitus with other specified complication, unspecified whether long term insulin use (HCC) We Kenneth Green refill metformin for 2 months.  Kenneth Green Kenneth Green check to see if his insurance Kenneth Green cover a GLP-1 next month at the start of a new year.  - metFORMIN (GLUCOPHAGE) 500 MG tablet; Take 1 tablet (500 mg total) by mouth 2 (two) times daily with a meal.  Dispense: 60 tablet; Refill: 1  4. Obesity, Current BMI 40.0 Kenneth Green is currently in the action stage of change. As such, his goal is to continue with weight loss efforts. He has agreed to practicing portion control and making smarter food choices, such as increasing vegetables and decreasing simple carbohydrates.   Kenneth Green is to work on "damage control" strategies over the holidays and we Kenneth Green work on getting back to a structured plan in January.  Exercise goals: As is.   Behavioral modification strategies: increasing lean protein intake.  Kenneth Green has agreed to follow-up with our clinic in 5 to 6 weeks. He was informed of the importance of frequent follow-up visits to maximize his success with intensive lifestyle modifications for his multiple health conditions.   Objective:   Blood pressure (!) 146/82, pulse 70, temperature 97.8 F (36.6 C), height '5\' 9"'$  (1.753 m), weight 271 lb (122.9 kg), SpO2 98 %. Body mass index is 40.02 kg/m.  General: Cooperative, alert, well developed, in no acute distress. HEENT: Conjunctivae and lids unremarkable. Cardiovascular: Regular rhythm.  Lungs: Normal work of breathing. Neurologic: No focal deficits.   Lab Results  Component Value Date  CREATININE 1.11 09/13/2022   BUN 16 09/13/2022   NA 139 09/13/2022   K 4.4 09/13/2022   CL 104 09/13/2022   CO2 19 (L) 09/13/2022   Lab Results  Component Value Date   ALT 30 09/13/2022   AST 21 09/13/2022   ALKPHOS 65 09/13/2022   BILITOT 0.4 09/13/2022   Lab Results  Component Value Date   HGBA1C 5.4 09/13/2022   HGBA1C 5.4  04/26/2022   HGBA1C 5.4 01/19/2022   HGBA1C 5.4 11/11/2021   HGBA1C 5.9 (H) 01/15/2021   Lab Results  Component Value Date   INSULIN 10.2 09/13/2022   INSULIN 23.5 04/26/2022   INSULIN 20.5 11/11/2021   INSULIN 12.8 01/15/2021   INSULIN 18.1 07/23/2020   Lab Results  Component Value Date   TSH 1.340 04/10/2019   Lab Results  Component Value Date   CHOL 177 09/13/2022   HDL 52 09/13/2022   LDLCALC 105 (H) 09/13/2022   TRIG 111 09/13/2022   Lab Results  Component Value Date   VD25OH 41.0 09/13/2022   VD25OH 46.4 04/26/2022   VD25OH 34.5 11/11/2021   Lab Results  Component Value Date   WBC 4.6 04/26/2022   HGB 13.8 04/26/2022   HCT 42.7 04/26/2022   MCV 84 04/26/2022   PLT 223 04/26/2022   No results found for: "IRON", "TIBC", "FERRITIN"  Attestation Statements:   Reviewed by clinician on day of visit: allergies, medications, problem list, medical history, surgical history, family history, social history, and previous encounter notes.   I, Trixie Dredge, am acting as transcriptionist for Dennard Nip, MD.  I have reviewed the above documentation for accuracy and completeness, and I agree with the above. -  Dennard Nip, MD

## 2022-11-17 NOTE — Progress Notes (Signed)
[facility] Counselor/Therapist Progress Note  [name], MRN: [id number],    Date: 11/17/2022  Time Spent: 45 mins  Treatment Type: Individual Therapy  Reported Symptoms: Pt presented via webex video for follow up session, granting consent for the session.  Pt stated he was in his home and no one else was present.  I shared with pt that I am in my office and no one else is present.  Mental Status Exam: Appearance:  Casual     Behavior: Appropriate  Motor: Normal  Speech/Language:  Clear and Coherent  Affect: Appropriate  Mood: normal  Thought process: normal  Thought content:   WNL  Sensory/Perceptual disturbances:   WNL  Orientation: oriented to person, place, and time/date  Attention: Good  Concentration: Good  Memory: WNL  Fund of knowledge:  Good  Insight:   Good  Judgment:  Good  Impulse Control: Good   Risk Assessment: Danger to Self:  No Self-injurious Behavior: No Danger to Others: No Duty to Warn:no Physical Aggression / Violence:No  Access to Firearms a concern: No  Gang Involvement:No   Subjective: Pt shares that "I split my knee incision open while I was at work.  I am seeing the surgeon weekly to check the incision."  Pt shares that "Christmas is not my favorite holiday.  There is a lot of pressure in giving and receiving gifts.  There is also the pressure of wondering what [name] is going to say to someone."  Pt shares that [name] is is still recovering from her shoulder surgery; she is still in a lot of pain and is already talking about needing to have the other shoulder done as well.  Pt is going back to work Midwife.  Pt shares that his work responsibilities are starting to settle out for him and he is able to do many of the things he is supposed to do for his new job.  They have a new person who is going to take over some of his responsibilities from his old job.  Pt shares that he continues to have difficulty eating  correctly.  "I talked to [employer] and we both know we need to get back to it."  Pt shares it should be easier if they are both working in the same direction.  Pt is hopeful that his job responsibilities will, in fact, settle out into his new role.  Pt shares he has re-enactments events for the next two weekends.  He continues to read the [employer] and is now beginning to read more about the 12 Step model of recovery.  We will meet in 3 wks for a follow up session.  Interventions: Cognitive Behavioral Therapy  Diagnosis:Adjustment disorder with mixed anxiety and depressed mood  Plan: Treatment Plan Strengths/Abilities:  Intelligent, Intuitive, Willing to participate in therapy Treatment Preferences:  Outpatient Individual Therapy Statement of Needs:  Patient is to use CBT, mindfulness and coping skills to help manage and/or decrease symptoms associated with their diagnosis. Symptoms:  Depressed/Irritable mood, worry, social withdrawal Problems Addressed:  Depressive thoughts, Sadness, Sleep issues, etc. Long Term Goals:  Pt to reduce overall level, frequency, and intensity of the feelings of depression as evidenced by decreased irritability, negative self talk, and helpless feelings from 6 to 7 days/week to 0 to 1 days/week, per client report, for at least 3 consecutive months.  Progress: 20% Short Term Goals:  Pt to verbally express understanding of the relationship between feelings of depression and their impact  on thinking patterns and behaviors.  Pt to verbalize an understanding of the role that distorted thinking plays in creating fears, excessive worry, and ruminations.  Progress: 20% Target Date:  11/18/2023 Frequency:  Bi-weekly Modality:  Cognitive Behavioral Therapy Interventions by Therapist:  Therapist will use CBT, Mindfulness exercises, Coping skills and Referrals, as needed by client. Client has verbally approved this treatment plan.  Ivan Anchors, [facility]

## 2022-11-24 ENCOUNTER — Other Ambulatory Visit (INDEPENDENT_AMBULATORY_CARE_PROVIDER_SITE_OTHER): Payer: Self-pay | Admitting: Family Medicine

## 2022-11-24 DIAGNOSIS — E559 Vitamin D deficiency, unspecified: Secondary | ICD-10-CM

## 2022-11-26 ENCOUNTER — Other Ambulatory Visit (INDEPENDENT_AMBULATORY_CARE_PROVIDER_SITE_OTHER): Payer: Self-pay | Admitting: Family Medicine

## 2022-11-26 DIAGNOSIS — E1169 Type 2 diabetes mellitus with other specified complication: Secondary | ICD-10-CM

## 2022-12-05 ENCOUNTER — Other Ambulatory Visit (INDEPENDENT_AMBULATORY_CARE_PROVIDER_SITE_OTHER): Payer: Self-pay | Admitting: Family Medicine

## 2022-12-05 DIAGNOSIS — I10 Essential (primary) hypertension: Secondary | ICD-10-CM

## 2022-12-08 ENCOUNTER — Ambulatory Visit (INDEPENDENT_AMBULATORY_CARE_PROVIDER_SITE_OTHER): Payer: 59 | Admitting: Family Medicine

## 2022-12-13 ENCOUNTER — Encounter (HOSPITAL_BASED_OUTPATIENT_CLINIC_OR_DEPARTMENT_OTHER): Payer: 59 | Attending: General Surgery | Admitting: General Surgery

## 2022-12-13 DIAGNOSIS — Z87891 Personal history of nicotine dependence: Secondary | ICD-10-CM | POA: Diagnosis not present

## 2022-12-13 DIAGNOSIS — I10 Essential (primary) hypertension: Secondary | ICD-10-CM | POA: Insufficient documentation

## 2022-12-13 DIAGNOSIS — E11622 Type 2 diabetes mellitus with other skin ulcer: Secondary | ICD-10-CM | POA: Diagnosis present

## 2022-12-13 DIAGNOSIS — F32A Depression, unspecified: Secondary | ICD-10-CM | POA: Diagnosis not present

## 2022-12-13 DIAGNOSIS — L97812 Non-pressure chronic ulcer of other part of right lower leg with fat layer exposed: Secondary | ICD-10-CM | POA: Diagnosis not present

## 2022-12-13 DIAGNOSIS — T8131XS Disruption of external operation (surgical) wound, not elsewhere classified, sequela: Secondary | ICD-10-CM | POA: Diagnosis not present

## 2022-12-13 DIAGNOSIS — X58XXXS Exposure to other specified factors, sequela: Secondary | ICD-10-CM | POA: Diagnosis not present

## 2022-12-13 DIAGNOSIS — Z8249 Family history of ischemic heart disease and other diseases of the circulatory system: Secondary | ICD-10-CM | POA: Diagnosis not present

## 2022-12-13 DIAGNOSIS — Z6839 Body mass index (BMI) 39.0-39.9, adult: Secondary | ICD-10-CM | POA: Insufficient documentation

## 2022-12-13 DIAGNOSIS — E1169 Type 2 diabetes mellitus with other specified complication: Secondary | ICD-10-CM | POA: Insufficient documentation

## 2022-12-13 DIAGNOSIS — Z833 Family history of diabetes mellitus: Secondary | ICD-10-CM | POA: Insufficient documentation

## 2022-12-13 NOTE — Progress Notes (Signed)
CHIDI, SHIRER ([id number]) [id number] Page 1 of 9 Visit Report for 12/13/2022 Chief Complaint Document Details Patient Name: Date of Service: Kenneth Green 12/13/2022 9:00 A M Medical Record Number: [id number] Patient Account Number: [id number] Date of Birth/Sex: Treating RN: 11/24/1965 (57 y.o. M) Primary Care Provider: Leitha Bleak Other Clinician: Referring Provider: Treating Provider/Extender: Gwenlyn Fudge in Treatment: 0 Information Obtained from: Patient Chief Complaint Patient presents to the wound care center with open non-healing surgical wound(s) Electronic Signature(s) Signed: 12/13/2022 10:10:15 AM By: Fredirick Maudlin MD FACS Entered By: Fredirick Maudlin on 12/13/2022 10:10:15 -------------------------------------------------------------------------------- Debridement Details Patient Name: Date of Service: Kenneth Hansen EL Green. 12/13/2022 9:00 A M Medical Record Number: [id number] Patient Account Number: [id number] Date of Birth/Sex: Treating RN: 10/23/1966 (57 y.o. [name], [name] Primary Care Provider: Leitha Bleak Other Clinician: Referring Provider: Treating Provider/Extender: Alver Fisher Weeks in Treatment: 0 Debridement Performed for Assessment: Wound #1 Right,Anterior Knee Performed By: Physician Fredirick Maudlin, MD Debridement Type: Debridement Level of Consciousness (Pre-procedure): Awake and Alert Pre-procedure Verification/Time Out Yes - 10:00 Taken: Start Time: 10:03 Pain Control: Lidocaine 4% T opical Solution T Area Debrided (L x W): otal 2.4 (cm) x 0.4 (cm) = 0.96 (cm) Tissue and other material debrided: Viable, Non-Viable, Eschar, Slough, Subcutaneous, Slough Level: Skin/Subcutaneous Tissue Debridement Description: Excisional Instrument: Curette Bleeding: Minimum Hemostasis Achieved: Pressure Procedural Pain: 0 Post Procedural Pain: 0 Response to  Treatment: Procedure was tolerated well Level of Consciousness (Post- Awake and Alert procedure): Post Debridement Measurements of Total Wound Length: (cm) 2.4 Width: (cm) 0.4 Depth: (cm) 0.2 Volume: (cm) 0.151 Character of Wound/Ulcer Post Debridement: Improved Post Procedure Diagnosis Same as Kenneth Green, Kenneth Green ([id number]) [id number] Page 2 of 9 Notes scribed by Baruch Gouty, RN for Dr. Celine Ahr Electronic Signature(s) Signed: 12/13/2022 11:23:06 AM By: Fredirick Maudlin MD FACS Signed: 12/13/2022 5:34:39 PM By: Baruch Gouty RN, BSN Entered By: Baruch Gouty on 12/13/2022 10:06:43 -------------------------------------------------------------------------------- HPI Details Patient Name: Date of Service: Kenneth Hansen EL Green. 12/13/2022 9:00 A M Medical Record Number: [id number] Patient Account Number: [id number] Date of Birth/Sex: Treating RN: December 28, 1965 (57 y.o. M) Primary Care Provider: Leitha Bleak Other Clinician: Referring Provider: Treating Provider/Extender: Alver Fisher Weeks in Treatment: 0 History of Present Illness HPI Description: ADMISSION 12/13/2022 This is a 57 year old man with a past medical history of "prediabetes" (hemoglobin A1c 5.7), morbid obesity, and hypertension. He had chronic bursitis in his right knee that had been drained on multiple occasions and ultimately he underwent a prepatellar bursectomy on October 21, 2022. Unfortunately, the wound dehisced. Initially, it was reapproximated with Steri-Strips, but this failed and he has just been applying a bandage or leaving it open to air. He showed the photographs over the course of the couple of months since his operation and there has been considerable wound closure since that time, but due to the long duration since his surgery, his orthopedic surgeon referred him to the wound care center for further evaluation and management. On the  anterior aspect of his right knee, there is a linear wound, consistent with prior surgical intervention. There is some eschar accumulation around the edges and slough on the wound surface. There is no erythema, induration, malodor, or purulent drainage to suggest infection. Electronic Signature(s) Signed: 12/13/2022 10:14:18 AM By: Fredirick Maudlin MD FACS Entered By: Fredirick Maudlin on 12/13/2022 10:14:18 -------------------------------------------------------------------------------- Physical Exam Details Patient Name: Date of Service: Kenneth Hansen EL  Green. 12/13/2022 9:00 A M Medical Record Number: [id number] Patient Account Number: [id number] Date of Birth/Sex: Treating RN: 1966-05-14 (57 y.o. M) Primary Care Provider: Leitha Bleak Other Clinician: Referring Provider: Treating Provider/Extender: Alver Fisher Weeks in Treatment: 0 Constitutional . . . . No acute distress. Respiratory Normal work of breathing on room air. Notes 12/13/2022: On the anterior aspect of his right knee, there is a linear wound, consistent with prior surgical intervention. There is some eschar accumulation around the edges and slough on the wound surface. There is no erythema, induration, malodor, or purulent drainage to suggest infection. Electronic Signature(s) Signed: 12/13/2022 10:14:51 AM By: Fredirick Maudlin MD FACS Entered By: Fredirick Maudlin on 12/13/2022 10:14:51 Kenneth Green, Kenneth Green ([id number]) [id number] Page 3 of 9 -------------------------------------------------------------------------------- Physician Orders Details Patient Name: Date of Service: Kenneth Crumble Green. 12/13/2022 9:00 A M Medical Record Number: [id number] Patient Account Number: [id number] Date of Birth/Sex: Treating RN: [birth year] (57 y.o. [name] Primary Care Provider: Leitha Bleak Other Clinician: Referring Provider: Treating Provider/Extender: Gwenlyn Fudge in Treatment: 0 Verbal / Phone Orders: No Diagnosis Coding ICD-10 Coding Code Description [phone] Non-pressure chronic ulcer of other part of right lower leg with fat layer exposed T81.31XS Disruption of external operation (surgical) wound, not elsewhere classified, sequela E66.01 Morbid (severe) obesity due to excess calories E11.69 Type 2 diabetes mellitus with other specified complication V56 Essential (primary) hypertension F32.A Depression, unspecified Follow-up Appointments ppointment in 1 week. - Dr. Celine Ahr RM 1 Return A Monday 1/29 @ 0730 am Anesthetic Wound #1 Right,Anterior Knee (In clinic) Topical Lidocaine 4% applied to wound bed Bathing/ Shower/ Hygiene May shower and wash wound with soap and water. Wound Treatment Wound #1 - Knee Wound Laterality: Right, Anterior Prim Dressing: Aquacel Ag Dressing, 2x2 (in/in) (DME) (Generic) 1 x Per Day/30 Days ary Discharge Instructions: insert into wound bed Secondary Dressing: Zetuvit Plus Silicone Border Dressing 4x4 (in/in) (DME) (Generic) 1 x Per Day/30 Days Discharge Instructions: Apply silicone border over primary dressing as directed. Patient Medications llergies: iodine A Notifications Medication Indication Start End prior to debridement 12/13/2022 lidocaine DOSE topical 4 % cream - cream topical Electronic Signature(s) Signed: 12/13/2022 10:15:01 AM By: Fredirick Maudlin MD FACS Entered By: Fredirick Maudlin on 12/13/2022 10:15:01 Problem List Details -------------------------------------------------------------------------------- Kenneth Green ([id number]) [id number] Page 4 of 9 Patient Name: Date of Service: Kenneth Green 12/13/2022 9:00 A M Medical Record Number: [id number] Patient Account Number: [id number] Date of Birth/Sex: Treating RN: 1965/12/02 (57 y.o. M) Primary Care Provider: Leitha Bleak Other Clinician: Referring  Provider: Treating Provider/Extender: Alver Fisher Weeks in Treatment: 0 Active Problems ICD-10 Encounter Code Description Active Date MDM Diagnosis [phone] Non-pressure chronic ulcer of other part of right lower leg with fat layer 12/13/2022 No Yes exposed T81.31XS Disruption of external operation (surgical) wound, not elsewhere classified, 12/13/2022 No Yes sequela E66.01 Morbid (severe) obesity due to excess calories 12/13/2022 No Yes E11.69 Type 2 diabetes mellitus with other specified complication 2/35/5732 No Yes I10 Essential (primary) hypertension 12/13/2022 No Yes F32.A Depression, unspecified 12/13/2022 No Yes Inactive Problems Resolved Problems Electronic Signature(s) Signed: 12/13/2022 10:09:05 AM By: Fredirick Maudlin MD FACS Entered By: Fredirick Maudlin on 12/13/2022 10:09:05 -------------------------------------------------------------------------------- Progress Note Details Patient Name: Date of Service: Kenneth Hansen EL Green. 12/13/2022 9:00 A M Medical Record Number: [id number] Patient Account Number: [id number] Date of Birth/Sex: Treating RN: July 25, 1966 (57 y.o. M) Primary Care Provider: Cari Caraway  W Other Clinician: Referring Provider: Treating Provider/Extender: Gwenlyn Fudge in Treatment: 0 Subjective Chief Complaint Information obtained from Patient Patient presents to the wound care center with open non-healing surgical wound(s) History of Present Illness (HPI) ADMISSION 12/13/2022 This is a 57 year old man with a past medical history of "prediabetes" (hemoglobin A1c 5.7), morbid obesity, and hypertension. He had chronic bursitis in his right knee that had been drained on multiple occasions and ultimately he underwent a prepatellar bursectomy on October 21, 2022. Unfortunately, the wound Kenneth Green, Kenneth Green ([id number]) [id number] Page 5 of 9 dehisced. Initially, it was  reapproximated with Steri-Strips, but this failed and he has just been applying a bandage or leaving it open to air. He showed the photographs over the course of the couple of months since his operation and there has been considerable wound closure since that time, but due to the long duration since his surgery, his orthopedic surgeon referred him to the wound care center for further evaluation and management. On the anterior aspect of his right knee, there is a linear wound, consistent with prior surgical intervention. There is some eschar accumulation around the edges and slough on the wound surface. There is no erythema, induration, malodor, or purulent drainage to suggest infection. Patient History Information obtained from Patient, Chart. Allergies iodine (Severity: Moderate, Reaction: hives) Family History Diabetes - Siblings, Heart Disease - Siblings,Mother,Maternal Grandparents, Hypertension - Mother,Maternal Grandparents, Stroke - Father, No family history of Cancer, Hereditary Spherocytosis, Kidney Disease, Lung Disease, Thyroid Problems, Tuberculosis. Social History Former smoker, Marital Status - Married, Alcohol Use - Rarely, Drug Use - No History, Caffeine Use - Daily - coffee. Medical History Eyes Patient has history of Glaucoma Denies history of Cataracts, Optic Neuritis Respiratory Patient has history of Sleep Apnea - CPAP Cardiovascular Patient has history of Hypertension Endocrine Patient has history of Type II Diabetes Denies history of Type I Diabetes Genitourinary Denies history of End Stage Renal Disease Integumentary (Skin) Denies history of History of Burn Oncologic Denies history of Received Chemotherapy, Received Radiation Psychiatric Denies history of Anorexia/bulimia, Confinement Anxiety Patient is treated with Oral Agents. Blood sugar is tested. Hospitalization/Surgery History - spinal fusion. - bursa removal right knee 10/21/2022. Medical A Surgical  History Notes nd Cardiovascular hyperlipidemia Neurologic spinal stenosis Review of Systems (ROS) Constitutional Symptoms (General Health) Denies complaints or symptoms of Fatigue, Fever, Chills, Marked Weight Change. Eyes Complains or has symptoms of Glasses / Contacts. Ear/Nose/Mouth/Throat Denies complaints or symptoms of Chronic sinus problems or rhinitis. Respiratory Denies complaints or symptoms of Chronic or frequent coughs, Shortness of Breath. Cardiovascular Denies complaints or symptoms of Chest pain. Gastrointestinal Denies complaints or symptoms of Frequent diarrhea, Nausea, Vomiting. Endocrine Denies complaints or symptoms of Heat/cold intolerance. Genitourinary Denies complaints or symptoms of Frequent urination. Integumentary (Skin) Complains or has symptoms of Wounds - right knee. Musculoskeletal Denies complaints or symptoms of Muscle Pain, Muscle Weakness. Neurologic Denies complaints or symptoms of Numbness/parasthesias. Psychiatric Denies complaints or symptoms of Claustrophobia. Objective Constitutional No acute distress. Kenneth Green, Kenneth Green ([id number]) [id number] Page 6 of 9 Vitals Time Taken: 9:08 AM, Height: 69 in, Source: Stated, Weight: 270 lbs, Source: Stated, BMI: 39.9, Temperature: 97.8 F, Pulse: 71 bpm, Respiratory Rate: 16 breaths/min, Blood Pressure: 137/79 mmHg, Capillary Blood Glucose: 98 mg/dl. General Notes: glucose per pt report this am Respiratory Normal work of breathing on room air. General Notes: 12/13/2022: On the anterior aspect of his right knee, there is a linear wound, consistent with prior surgical intervention. There  is some eschar accumulation around the edges and slough on the wound surface. There is no erythema, induration, malodor, or purulent drainage to suggest infection. Integumentary (Hair, Skin) Wound #1 status is Open. Original cause of wound was Surgical Injury. The date acquired was:  10/28/2022. The wound is located on the Right,Anterior Knee. The wound measures 2.4cm length x 0.4cm width x 0.2cm depth; 0.754cm^2 area and 0.151cm^3 volume. There is Fat Layer (Subcutaneous Tissue) exposed. There is no tunneling noted, however, there is undermining starting at 1:00 and ending at 4:00 with a maximum distance of 0.6cm. There is a medium amount of serous drainage noted. The wound margin is well defined and not attached to the wound base. There is large (67-100%) red granulation within the wound bed. There is a small (1-33%) amount of necrotic tissue within the wound bed including Eschar and Adherent Slough. The periwound skin appearance had no abnormalities noted for texture. The periwound skin appearance had no abnormalities noted for moisture. The periwound skin appearance had no abnormalities noted for color. Periwound temperature was noted as No Abnormality. The periwound has tenderness on palpation. Assessment Active Problems ICD-10 Non-pressure chronic ulcer of other part of right lower leg with fat layer exposed Disruption of external operation (surgical) wound, not elsewhere classified, sequela Morbid (severe) obesity due to excess calories Type 2 diabetes mellitus with other specified complication Essential (primary) hypertension Depression, unspecified Procedures Wound #1 Pre-procedure diagnosis of Wound #1 is a Dehisced Wound located on the Right,Anterior Knee . There was a Excisional Skin/Subcutaneous Tissue Debridement with a total area of 0.96 sq cm performed by Fredirick Maudlin, MD. With the following instrument(s): Curette to remove Viable and Non-Viable tissue/material. Material removed includes Eschar, Subcutaneous Tissue, and Slough after achieving pain control using Lidocaine 4% T opical Solution. No specimens were taken. A time out was conducted at 10:00, prior to the start of the procedure. A Minimum amount of bleeding was controlled with Pressure. The  procedure was tolerated well with a pain level of 0 throughout and a pain level of 0 following the procedure. Post Debridement Measurements: 2.4cm length x 0.4cm width x 0.2cm depth; 0.151cm^3 volume. Character of Wound/Ulcer Post Debridement is improved. Post procedure Diagnosis Wound #1: Same as Pre-Procedure General Notes: scribed by Baruch Gouty, RN for Dr. Celine Ahr. Plan Follow-up Appointments: Return Appointment in 1 week. - Dr. Celine Ahr RM 1 Monday 1/29 @ 0730 am Anesthetic: Wound #1 Right,Anterior Knee: (In clinic) Topical Lidocaine 4% applied to wound bed Bathing/ Shower/ Hygiene: May shower and wash wound with soap and water. The following medication(s) was prescribed: lidocaine topical 4 % cream cream topical for prior to debridement was prescribed at facility WOUND #1: - Knee Wound Laterality: Right, Anterior Prim Dressing: Aquacel Ag Dressing, 2x2 (in/in) (DME) (Generic) 1 x Per Day/30 Days ary Discharge Instructions: insert into wound bed Secondary Dressing: Zetuvit Plus Silicone Border Dressing 4x4 (in/in) (DME) (Generic) 1 x Per Day/30 Days Discharge Instructions: Apply silicone border over primary dressing as directed. 12/13/2022: This is a 57 year old man who underwent a prepatellar bursectomy in November. His wound dehisced about a week after surgery. Due to the prolonged healing, he was referred to the wound care center for further evaluation and management. On the anterior aspect of his right knee, there is a linear wound, consistent with prior surgical intervention. There is some eschar accumulation around the edges and slough on the wound surface. There is no erythema, induration, malodor, or purulent drainage to suggest infection. I used a curette  to debride slough, eschar, and nonviable subcutaneous tissue from the wound. I am going to use silver alginate with a foam border dressing. Follow-up in 1 week. Kenneth Green, Kenneth Green ([id number])  [id number] Page 7 of 9 Electronic Signature(s) Signed: 12/13/2022 10:17:39 AM By: Fredirick Maudlin MD FACS Entered By: Fredirick Maudlin on 12/13/2022 10:17:38 -------------------------------------------------------------------------------- HxROS Details Patient Name: Date of Service: Kenneth Hansen EL Green. 12/13/2022 9:00 A M Medical Record Number: [id number] Patient Account Number: [id number] Date of Birth/Sex: Treating RN: 07-26-1966 (57 y.o. [name] Primary Care Provider: Leitha Bleak Other Clinician: Referring Provider: Treating Provider/Extender: Alver Fisher Weeks in Treatment: 0 Information Obtained From Patient Chart Constitutional Symptoms (General Health) Complaints and Symptoms: Negative for: Fatigue; Fever; Chills; Marked Weight Change Eyes Complaints and Symptoms: Positive for: Glasses / Contacts Medical History: Positive for: Glaucoma Negative for: Cataracts; Optic Neuritis Ear/Nose/Mouth/Throat Complaints and Symptoms: Negative for: Chronic sinus problems or rhinitis Respiratory Complaints and Symptoms: Negative for: Chronic or frequent coughs; Shortness of Breath Medical History: Positive for: Sleep Apnea - CPAP Cardiovascular Complaints and Symptoms: Negative for: Chest pain Medical History: Positive for: Hypertension Past Medical History Notes: hyperlipidemia Gastrointestinal Complaints and Symptoms: Negative for: Frequent diarrhea; Nausea; Vomiting Endocrine Complaints and Symptoms: Negative for: Heat/cold intolerance Medical History: Positive for: Type II Diabetes Negative for: Type I Diabetes Time with diabetes: 2 months Treated with: Oral agents Blood sugar tested every day: Yes Tested : daily Kenneth Green, Kenneth Green ([id number]) [id number] Page 8 of 9 Genitourinary Complaints and Symptoms: Negative for: Frequent urination Medical History: Negative for:  End Stage Renal Disease Integumentary (Skin) Complaints and Symptoms: Positive for: Wounds - right knee Medical History: Negative for: History of Burn Musculoskeletal Complaints and Symptoms: Negative for: Muscle Pain; Muscle Weakness Neurologic Complaints and Symptoms: Negative for: Numbness/parasthesias Medical History: Past Medical History Notes: spinal stenosis Psychiatric Complaints and Symptoms: Negative for: Claustrophobia Medical History: Negative for: Anorexia/bulimia; Confinement Anxiety Hematologic/Lymphatic Immunological Oncologic Medical History: Negative for: Received Chemotherapy; Received Radiation HBO Extended History Items Eyes: Glaucoma Immunizations Pneumococcal Vaccine: Received Pneumococcal Vaccination: No Implantable Devices None Hospitalization / Surgery History Type of Hospitalization/Surgery spinal fusion bursa removal right knee 10/21/2022 Family and Social History Cancer: No; Diabetes: Yes - Siblings; Heart Disease: Yes - Siblings,Mother,Maternal Grandparents; Hereditary Spherocytosis: No; Hypertension: Yes - Mother,Maternal Grandparents; Kidney Disease: No; Lung Disease: No; Stroke: Yes - Father; Thyroid Problems: No; Tuberculosis: No; Former smoker; Marital Status - Married; Alcohol Use: Rarely; Drug Use: No History; Caffeine Use: Daily - coffee; Financial Concerns: No; Food, Clothing or Shelter Needs: No; Support System Lacking: No; Transportation Concerns: No Electronic Signature(s) Signed: 12/13/2022 11:23:06 AM By: Fredirick Maudlin MD FACS Signed: 12/13/2022 5:34:39 PM By: Baruch Gouty RN, BSN Entered By: Baruch Gouty on 12/13/2022 09:46:55 Kenneth Green, Kenneth Green ([id number]) [id number] Page 9 of 9 -------------------------------------------------------------------------------- SuperBill Details Patient Name: Date of Service: Kenneth Green 12/13/2022 Medical Record Number: [id number] Patient Account  Number: [id number] Date of Birth/Sex: Treating RN: Feb 21, 1966 (57 y.o. M) Primary Care Provider: Leitha Bleak Other Clinician: Referring Provider: Treating Provider/Extender: Alver Fisher Weeks in Treatment: 0 Diagnosis Coding ICD-10 Codes Code Description [phone] Non-pressure chronic ulcer of other part of right lower leg with fat layer exposed T81.31XS Disruption of external operation (surgical) wound, not elsewhere classified, sequela E66.01 Morbid (severe) obesity due to excess calories E11.69 Type 2 diabetes mellitus with other specified complication T26 Essential (primary) hypertension F32.A Depression, unspecified Facility Procedures : CPT4 Code: [id number] Description: [phone] -  WOUND CARE VISIT-LEV 3 EST PT Modifier: 25 Quantity: 1 : CPT4 Code: [id number] Description: 02111 - DEB SUBQ TISSUE 20 SQ CM/< ICD-10 Diagnosis Description B52.080 Non-pressure chronic ulcer of other part of right lower leg with fat layer expo T81.31XS Disruption of external operation (surgical) wound, not elsewhere classified, se Modifier: sed quela Quantity: 1 Physician Procedures : CPT4 Code Description Modifier [id number] 24497 - WC PHYS LEVEL 4 - NEW PT 25 ICD-10 Diagnosis Description N30.051 Non-pressure chronic ulcer of other part of right lower leg with fat layer exposed T81.31XS Disruption of external operation (surgical)  wound, not elsewhere classified, sequela E66.01 Morbid (severe) obesity due to excess calories E11.69 Type 2 diabetes mellitus with other specified complication Quantity: 1 : [id number] 11042 - WC PHYS SUBQ TISS 20 SQ CM ICD-10 Diagnosis Description L97.812 Non-pressure chronic ulcer of other part of right lower leg with fat layer exposed T81.31XS Disruption of external operation (surgical) wound, not elsewhere classified,  sequela Quantity: 1 Electronic Signature(s) Signed: 12/13/2022 11:23:06 AM By: Fredirick Maudlin MD FACS Signed: 12/13/2022 5:34:39 PM By:  Baruch Gouty RN, BSN Previous Signature: 12/13/2022 10:17:59 AM Version By: Fredirick Maudlin MD FACS Entered By: Baruch Gouty on 12/13/2022 11:17:35

## 2022-12-13 NOTE — Progress Notes (Signed)
MILLS, MITTON ([id number]) [id number] Page 1 of 8 Visit Report for 12/13/2022 Allergy List Details Patient Name: Date of Service: Kenneth Green 12/13/2022 9:00 A M Medical Record Number: [id number] Patient Account Number: [id number] Date of Birth/Sex: Treating RN: 01-14-1966 (57 y.o. Kenneth Green Primary Care Kenneth Green: Kenneth Green Other Clinician: Referring Kenneth Green: Treating Kenneth Green: Kenneth Green Weeks in Treatment: 0 Allergies Active Allergies iodine Reaction: hives Severity: Moderate Allergy Notes Electronic Signature(s) Signed: 12/13/2022 4:52:32 PM By: Kenneth East RN Entered By: Kenneth Green on 12/13/2022 09:10:19 -------------------------------------------------------------------------------- Arrival Information Details Patient Name: Date of Service: Kenneth Green EL E. 12/13/2022 9:00 A M Medical Record Number: [id number] Patient Account Number: [id number] Date of Birth/Sex: Treating RN: [birth year] (57 y.o. Kenneth Green Primary Care Kenneth Green: Kenneth Green Other Clinician: Referring Kenneth Green: Treating Kenneth Green: Kenneth Green in Treatment: 0 Visit Information Patient Arrived: Ambulatory Arrival Time: 09:07 Accompanied By: self Transfer Assistance: None Patient Identification Verified: Yes Secondary Verification Process Completed: Yes Patient Requires Transmission-Based Precautions: No Patient Has Alerts: No Electronic Signature(s) Signed: 12/13/2022 4:52:32 PM By: Kenneth East RN Entered By: Kenneth Green on 12/13/2022 09:08:24 Clinic Level of Care Assessment Details -------------------------------------------------------------------------------- Kenneth Green ([id number]) [id number] Page 2 of 8 Patient Name: Date of Service: Kenneth Green 12/13/2022 9:00 A M Medical Record Number: [id number] Patient Account  Number: [id number] Date of Birth/Sex: Treating RN: 02/16/1966 (57 y.o. Kenneth Green Primary Care Kenneth Green: Kenneth Green Other Clinician: Referring Kenneth Green: Treating Kenneth Green: Kenneth Green Weeks in Treatment: 0 Clinic Level of Care Assessment Items TOOL 1 Quantity Score '[]'$  - 0 Use when EandM and Procedure is performed on INITIAL visit ASSESSMENTS - Nursing Assessment / Reassessment X- 1 20 General Physical Exam (combine w/ comprehensive assessment (listed just below) when performed on new pt. evals) X- 1 25 Comprehensive Assessment (HX, ROS, Risk Assessments, Wounds Hx, etc.) ASSESSMENTS - Wound and Skin Assessment / Reassessment '[]'$  - 0 Dermatologic / Skin Assessment (not related to wound area) ASSESSMENTS - Ostomy and/or Continence Assessment and Care '[]'$  - 0 Incontinence Assessment and Management '[]'$  - 0 Ostomy Care Assessment and Management (repouching, etc.) PROCESS - Coordination of Care X - Simple Patient / Family Education for ongoing care 1 15 '[]'$  - 0 Complex (extensive) Patient / Family Education for ongoing care X- 1 10 Staff obtains Programmer, systems, Records, T Results / Process Orders est '[]'$  - 0 Staff telephones HHA, Nursing Homes / Clarify orders / etc '[]'$  - 0 Routine Transfer to another Facility (non-emergent condition) '[]'$  - 0 Routine Hospital Admission (non-emergent condition) X- 1 15 New Admissions / Biomedical engineer / Ordering NPWT Apligraf, etc. , '[]'$  - 0 Emergency Hospital Admission (emergent condition) PROCESS - Special Needs '[]'$  - 0 Pediatric / Minor Patient Management '[]'$  - 0 Isolation Patient Management '[]'$  - 0 Hearing / Language / Visual special needs '[]'$  - 0 Assessment of Community assistance (transportation, D/C planning, etc.) '[]'$  - 0 Additional assistance / Altered mentation '[]'$  - 0 Support Surface(s) Assessment (bed, cushion, seat, etc.) INTERVENTIONS - Miscellaneous '[]'$  - 0 External ear exam '[]'$  -  0 Patient Transfer (multiple staff / Civil Service fast streamer / Similar devices) '[]'$  - 0 Simple Staple / Suture removal (25 or less) '[]'$  - 0 Complex Staple / Suture removal (26 or more) '[]'$  - 0 Hypo/Hyperglycemic Management (do not check if billed separately) '[]'$  - 0 Ankle / Brachial Index (ABI) - do not check  if billed separately Has the patient been seen at the hospital within the last three years: Yes Total Score: 85 Level Of Care: New/Established - Level 3 Electronic Signature(s) Signed: 12/13/2022 5:34:39 PM By: Baruch Gouty RN, BSN Entered By: Baruch Gouty on 12/13/2022 10:04:28 Kenneth Green, Kenneth Green ([id number]) [id number] Page 3 of 8 -------------------------------------------------------------------------------- Encounter Discharge Information Details Patient Name: Date of Service: Kenneth Green 12/13/2022 9:00 A M Medical Record Number: [id number] Patient Account Number: [id number] Date of Birth/Sex: Treating RN: 10-11-1966 (57 y.o. Kenneth Green Primary Care Kenneth Green: Kenneth Green Other Clinician: Referring Kenneth Green: Treating Kenneth Green: Kenneth Green in Treatment: 0 Encounter Discharge Information Items Post Procedure Vitals Discharge Condition: Stable Temperature (F): 97.8 Ambulatory Status: Ambulatory Pulse (bpm): 71 Discharge Destination: Home Respiratory Rate (breaths/min): 18 Transportation: Private Auto Blood Pressure (mmHg): 137/79 Accompanied By: self Schedule Follow-up Appointment: Yes Clinical Summary of Care: Patient Declined Electronic Signature(s) Signed: 12/13/2022 5:34:39 PM By: Baruch Gouty RN, BSN Entered By: Baruch Gouty on 12/13/2022 11:19:31 -------------------------------------------------------------------------------- Lower Extremity Assessment Details Patient Name: Date of Service: Kenneth Green EL E. 12/13/2022 9:00 A M Medical Record Number: [id number] Patient Account  Number: [id number] Date of Birth/Sex: Treating RN: 08-Oct-1966 (57 y.o. Kenneth Green Primary Care Kenneth Green: Kenneth Green Other Clinician: Referring Kristl Morioka: Treating Kenneth Green: Kenneth Green Weeks in Treatment: 0 Edema Assessment Assessed: [Left: No] [Right: No] Edema: [Left: N] [Right: o] Electronic Signature(s) Signed: 12/13/2022 4:52:32 PM By: Kenneth East RN Signed: 12/13/2022 5:34:39 PM By: Baruch Gouty RN, BSN Entered By: Baruch Gouty on 12/13/2022 09:50:04 -------------------------------------------------------------------------------- Multi Wound Chart Details Patient Name: Date of Service: Kenneth Green EL E. 12/13/2022 9:00 A M Medical Record Number: [id number] Patient Account Number: [id number] Date of Birth/Sex: Treating RN: [birth year] (57 y.o. M) Primary Care Osualdo Hansell: Kenneth Green Other Clinician: Referring Jenaveve Fenstermaker: Treating Kenneth Green: Kenneth Green Weeks in Treatment: 0 Vital Signs HILLERY, BHALLA ([id number]) [id number] Page 4 of 8 Height(in): 69 Capillary Blood Glucose(mg/dl): 98 Weight(lbs): 270 Pulse(bpm): 71 Body Mass Index(BMI): 39.9 Blood Pressure(mmHg): 137/79 Temperature(F): 97.8 Respiratory Rate(breaths/min): 16 [1:Photos:] [N/A:N/A] Anterior Knee N/A N/A Wound Location: Surgical Injury N/A N/A Wounding Event: Dehisced Wound N/A N/A Primary Etiology: Glaucoma, Sleep Apnea, N/A N/A Comorbid History: Hypertension, Type II Diabetes 10/28/2022 N/A N/A Date Acquired: 0 N/A N/A Weeks of Treatment: Open N/A N/A Wound Status: No N/A N/A Wound Recurrence: 2.4x0.4x0.2 N/A N/A Measurements L x W x D (cm) 0.754 N/A N/A A (cm) : rea 0.151 N/A N/A Volume (cm) : 1 Starting Position 1 (o'clock): 4 Ending Position 1 (o'clock): 0.6 Maximum Distance 1 (cm): Yes N/A N/A Undermining: Full Thickness Without Exposed N/A  N/A Classification: Support Structures Medium N/A N/A Exudate A mount: Serous N/A N/A Exudate Type: amber N/A N/A Exudate Color: Well defined, not attached N/A N/A Wound Margin: Large (67-100%) N/A N/A Granulation A mount: Red N/A N/A Granulation Quality: Small (1-33%) N/A N/A Necrotic A mount: Eschar, Adherent Slough N/A N/A Necrotic Tissue: Fat Layer (Subcutaneous Tissue): Yes N/A N/A Exposed Structures: Fascia: No Tendon: No Muscle: No Joint: No Bone: No None N/A N/A Epithelialization: Debridement - Excisional N/A N/A Debridement: Pre-procedure Verification/Time Out 10:00 N/A N/A Taken: Lidocaine 4% Topical Solution N/A N/A Pain Control: Necrotic/Eschar, Subcutaneous, N/A N/A Tissue Debrided: Slough Skin/Subcutaneous Tissue N/A N/A Level: 0.96 N/A N/A Debridement A (sq cm): rea Curette N/A N/A Instrument: Minimum N/A N/A Bleeding: Pressure N/A N/A Hemostasis Achieved: 0 N/A N/A Procedural  Pain: 0 N/A N/A Post Procedural Pain: Debridement Treatment Response: Procedure was tolerated well N/A N/A Post Debridement Measurements L x 2.4x0.4x0.2 N/A N/A W x D (cm) 0.151 N/A N/A Post Debridement Volume: (cm) Scarring: No N/A N/A Periwound Skin Texture: No Abnormalities Noted N/A N/A Periwound Skin Moisture: Rubor: No N/A N/A Periwound Skin Color: No Abnormality N/A N/A Temperature: Yes N/A N/A Tenderness on Palpation: Debridement N/A N/A Procedures Performed: Treatment Notes Electronic Signature(s) Signed: 12/13/2022 10:10:06 AM By: Fredirick Maudlin MD FACS Entered By: Fredirick Maudlin on 12/13/2022 10:10:06 Kenneth Green ([id number]) [id number] Page 5 of 8 -------------------------------------------------------------------------------- Multi-Disciplinary Care Plan Details Patient Name: Date of Service: Kenneth Green 12/13/2022 9:00 A M Medical Record Number: [id number] Patient Account Number: [id number] Date  of Birth/Sex: Treating RN: Feb 15, 1966 (57 y.o. Kenneth Green Primary Care Chloeann Alfred: Kenneth Green Other Clinician: Referring Kenneth Green: Treating Kenneth Green: Kenneth Green in Treatment: [place] reviewed with physician Active Inactive Wound/Skin Impairment Nursing Diagnoses: Impaired tissue integrity Knowledge deficit related to ulceration/compromised skin integrity Goals: Patient/caregiver will verbalize understanding of skin care regimen Date Initiated: 12/13/2022 Target Resolution Date: 01/10/2023 Goal Status: Active Ulcer/skin breakdown will have a volume reduction of 30% by week 4 Date Initiated: 12/13/2022 Target Resolution Date: 01/10/2023 Goal Status: Active Interventions: Assess patient/caregiver ability to obtain necessary supplies Assess patient/caregiver ability to perform ulcer/skin care regimen upon admission and as needed Assess ulceration(s) every visit Provide education on ulcer and skin care Treatment Activities: Skin care regimen initiated : 12/13/2022 Topical wound management initiated : 12/13/2022 Notes: Electronic Signature(s) Signed: 12/13/2022 5:34:39 PM By: Baruch Gouty RN, BSN Entered By: Baruch Gouty on 12/13/2022 09:57:21 -------------------------------------------------------------------------------- Pain Assessment Details Patient Name: Date of Service: Kenneth Green EL E. 12/13/2022 9:00 A M Medical Record Number: [id number] Patient Account Number: [id number] Date of Birth/Sex: Treating RN: 04-01-1966 (57 y.o. Kenneth Green Primary Care Jacoba Cherney: Kenneth Green Other Clinician: Referring Dannia Snook: Treating Loui Massenburg/Extender: Kenneth Green Weeks in Treatment: 0 Active Problems Location of Pain Severity and Description of Pain Patient Has Paino No Site Locations With Dressing Change: No Kenneth Green, Kenneth Green ([id number]) [phone].pdf  Page 6 of 8 Rate the pain. Current Pain Level: 0 Pain Management and Medication Current Pain Management: Electronic Signature(s) Signed: 12/13/2022 4:52:32 PM By: Kenneth East RN Entered By: Kenneth Green on 12/13/2022 09:19:44 -------------------------------------------------------------------------------- Patient/Caregiver Education Details Patient Name: Date of Service: Kenneth Green 1/22/2024andnbsp9:00 [place] Record Number: [id number] Patient Account Number: [id number] Date of Birth/Gender: Treating RN: 08/17/1966 (57 y.o. Kenneth Green Primary Care Physician: Kenneth Green Other Clinician: Referring Physician: Treating Physician/Extender: Kenneth Green in Treatment: 0 Education Assessment Education Provided To: Patient Education Topics Provided Welcome [facility] Packet: o Handouts: Welcome T [facility] o Methods: Explain/Verbal, Printed Responses: Reinforcements needed, State content correctly Wound/Skin Impairment: Handouts: Caring for Your Ulcer Methods: Explain/Verbal, Printed Responses: Reinforcements needed, State content correctly Electronic Signature(s) Signed: 12/13/2022 5:34:39 PM By: Baruch Gouty RN, BSN Entered By: Baruch Gouty on 12/13/2022 09:58:05 Kenneth Green, Kenneth Green ([id number]) [id number] Page 7 of 8 -------------------------------------------------------------------------------- Wound Assessment Details Patient Name: Date of Service: Kenneth Crumble E. 12/13/2022 9:00 A M Medical Record Number: [id number] Patient Account Number: [id number] Date of Birth/Sex: Treating RN: 02/16/1966 (57 y.o. Kenneth Green Primary Care Taras Rask: Kenneth Green Other Clinician: Referring Amazing Cowman: Treating Obelia Bonello/Extender: Kenneth Green Weeks in  Treatment: 0 Wound Status Wound Number: 1 Primary Etiology: Dehisced Wound Wound  Location: Anterior Knee Wound Status: Open Wounding Event: Surgical Injury Comorbid History: Glaucoma, Sleep Apnea, Hypertension, Type II Diabetes Date Acquired: 10/28/2022 Weeks Of Treatment: 0 Clustered Wound: No Photos Wound Measurements Length: (cm) 2.4 Width: (cm) 0.4 Depth: (cm) 0.2 Area: (cm) 0.754 Volume: (cm) 0.151 % Reduction in Area: % Reduction in Volume: Epithelialization: None Tunneling: No Undermining: Yes Starting Position (o'clock): 1 Ending Position (o'clock): 4 Maximum Distance: (cm) 0.6 Wound Description Classification: Full Thickness Without Exposed Support Structures Wound Margin: Well defined, not attached Exudate Amount: Medium Exudate Type: Serous Exudate Color: amber Foul Odor After Cleansing: No Slough/Fibrino Yes Wound Bed Granulation Amount: Large (67-100%) Exposed Structure Granulation Quality: Red Fascia Exposed: No Necrotic Amount: Small (1-33%) Fat Layer (Subcutaneous Tissue) Exposed: Yes Necrotic Quality: Eschar, Adherent Slough Tendon Exposed: No Muscle Exposed: No Joint Exposed: No Bone Exposed: No Periwound Skin Texture Texture Color No Abnormalities Noted: Yes No Abnormalities Noted: Yes Moisture Temperature / Pain No Abnormalities Noted: Yes Temperature: No Abnormality Tenderness on Palpation: Yes Electronic Signature(s) Signed: 12/13/2022 4:52:32 PM By: Kenneth East RN Signed: 12/13/2022 5:34:39 PM By: Baruch Gouty RN, BSN Entered By: Baruch Gouty on 12/13/2022 09:52:08 Kenneth Green, Kenneth Green ([id number]) [id number] Page 8 of 8 -------------------------------------------------------------------------------- Vitals Details Patient Name: Date of Service: Kenneth Crumble E. 12/13/2022 9:00 A M Medical Record Number: [id number] Patient Account Number: [id number] Date of Birth/Sex: Treating RN: 1966-09-12 (57 y.o. Kenneth Green Primary Care Alexandre Faries: Kenneth Green Other  Clinician: Referring Kenneth Green: Treating Cantrell Larouche/Extender: Kenneth Green Weeks in Treatment: 0 Vital Signs Time Taken: 09:08 Temperature (F): 97.8 Height (in): 69 Pulse (bpm): 71 Source: Stated Respiratory Rate (breaths/min): 16 Weight (lbs): 270 Blood Pressure (mmHg): 137/79 Source: Stated Capillary Blood Glucose (mg/dl): 98 Body Mass Index (BMI): 39.9 Reference Range: 80 - 120 mg / dl Notes glucose per pt report this am Electronic Signature(s) Signed: 12/13/2022 5:34:39 PM By: Baruch Gouty RN, BSN Entered By: Baruch Gouty on 12/13/2022 09:38:38

## 2022-12-13 NOTE — Progress Notes (Signed)
ROURKE, MCQUITTY ([id number]) [phone] [id number] Page 1 of 4 Visit Report for 12/13/2022 Abuse Risk Screen Details Patient Name: Date of Service: Kenneth Green 12/13/2022 9:00 A M Medical Record Number: [id number] Patient Account Number: [id number] Date of Birth/Sex: Treating RN: [birth year] (57 y.o. [name] Primary Care Bibiana Gillean: Leitha Bleak Other Clinician: Referring Lauretta Sallas: Treating Jolynn Bajorek/Extender: Alver Fisher Weeks in Treatment: 0 Abuse Risk Screen Items Answer ABUSE RISK SCREEN: Has anyone close to you tried to hurt or harm you recentlyo No Do you feel uncomfortable with anyone in your familyo No Has anyone forced you do things that you didnt want to doo No Electronic Signature(s) Signed: 12/13/2022 5:34:39 PM By: Baruch Gouty RN, BSN Entered By: Baruch Gouty on 12/13/2022 09:47:17 -------------------------------------------------------------------------------- Activities of Daily Living Details Patient Name: Date of Service: Kenneth Hansen EL Green. 12/13/2022 9:00 A M Medical Record Number: [id number] Patient Account Number: [id number] Date of Birth/Sex: Treating RN: 1966-05-05 (57 y.o. [name] Primary Care Brendalee Matthies: Leitha Bleak Other Clinician: Referring Howell Groesbeck: Treating Talicia Sui/Extender: Alver Fisher Weeks in Treatment: 0 Activities of Daily Living Items Answer Activities of Daily Living (Please select one for each item) Drive Automobile Completely Able T Medications ake Completely Able Use T elephone Completely Able Care for Appearance Completely Able Use T oilet Completely Able Bath / Shower Completely Able Dress Self Completely Able Feed Self Completely Able Walk Completely Able Get In / Out Bed Completely Able Housework Completely Able Prepare Meals Completely Able Handle Money Completely Able Shop for Self Completely Able Electronic  Signature(s) Signed: 12/13/2022 5:34:39 PM By: Baruch Gouty RN, BSN Entered By: Baruch Gouty on 12/13/2022 09:47:41 Kenneth Green, Kenneth Green ([id number]) [id number] Page 2 of 4 -------------------------------------------------------------------------------- Education Screening Details Patient Name: Date of Service: Kenneth Crumble Green. 12/13/2022 9:00 A M Medical Record Number: [id number] Patient Account Number: [id number] Date of Birth/Sex: Treating RN: 06-03-1966 (57 y.o. [name] Primary Care Quintina Hakeem: Leitha Bleak Other Clinician: Referring Harrold Fitchett: Treating Rihan Schueler/Extender: Gwenlyn Fudge in Treatment: 0 Primary Learner Assessed: Patient Learning Preferences/Education Level/Primary Language Learning Preference: Explanation, Demonstration, Printed Material Highest Education Level: College or Above Preferred Language: English Cognitive Barrier Language Barrier: No Translator Needed: No Memory Deficit: No Emotional Barrier: No Cultural/Religious Beliefs Affecting Medical Care: No Physical Barrier Impaired Vision: Yes Glasses Impaired Hearing: No Decreased Hand dexterity: No Knowledge/Comprehension Knowledge Level: High Comprehension Level: High Ability to understand written instructions: High Ability to understand verbal instructions: High Motivation Anxiety Level: Calm Cooperation: Cooperative Education Importance: Acknowledges Need Interest in Health Problems: Asks Questions Perception: Coherent Willingness to Engage in Self-Management High Activities: Readiness to Engage in Self-Management High Activities: Electronic Signature(s) Signed: 12/13/2022 5:34:39 PM By: Baruch Gouty RN, BSN Entered By: Baruch Gouty on 12/13/2022 09:48:11 -------------------------------------------------------------------------------- Fall Risk Assessment Details Patient Name: Date of Service: Kenneth Hansen  EL Green. 12/13/2022 9:00 A M Medical Record Number: [id number] Patient Account Number: [id number] Date of Birth/Sex: Treating RN: 1966-04-10 (57 y.o. [name] Primary Care Mena Lienau: Leitha Bleak Other Clinician: Referring Jannet Calip: Treating Makyla Bye/Extender: Alver Fisher Weeks in Treatment: 0 Fall Risk Assessment Items Have you had 2 or more falls in the last 12 monthso 0 No Kenneth Green, Kenneth Green ([id number]) [phone] [id number] Page 3 of 4 Have you had any fall that resulted in injury in the last 12 monthso 0 No FALLS RISK SCREEN History of falling - immediate or within 3  months 25 Yes Secondary diagnosis (Do you have 2 or more medical diagnoseso) 0 No Ambulatory aid None/bed rest/wheelchair/nurse 0 No Crutches/cane/walker 0 No Furniture 0 No Intravenous therapy Access/Saline/Heparin Lock 0 No Gait/Transferring Normal/ bed rest/ wheelchair 0 Yes Weak (short steps with or without shuffle, stooped but able to lift head while walking, may seek 0 No support from furniture) Impaired (short steps with shuffle, may have difficulty arising from chair, head down, impaired 0 No balance) Mental Status Oriented to own ability 0 Yes Electronic Signature(s) Signed: 12/13/2022 5:34:39 PM By: Baruch Gouty RN, BSN Entered By: Baruch Gouty on 12/13/2022 09:48:25 -------------------------------------------------------------------------------- Foot Assessment Details Patient Name: Date of Service: Kenneth Hansen EL Green. 12/13/2022 9:00 A M Medical Record Number: [id number] Patient Account Number: [id number] Date of Birth/Sex: Treating RN: 1965/11/26 (57 y.o. [name] Primary Care Chanee Henrickson: Leitha Bleak Other Clinician: Referring Christna Kulick: Treating Jaliel Deavers/Extender: Alver Fisher Weeks in Treatment: 0 Foot Assessment Items Site Locations + = Sensation present, - = Sensation absent, C = Callus, U =  Ulcer R = Redness, W = Warmth, M = Maceration, PU = Pre-ulcerative lesion F = Fissure, S = Swelling, D = Dryness Assessment Right: Left: Other Deformity: No No Prior Foot Ulcer: No No Prior Amputation: No No Charcot Joint: No No Ambulatory Status: Ambulatory Without Help GaitEVERARD, Kenneth Green ([id number]) [phone].pdf Page 4 of 4 Electronic Signature(s) Signed: 12/13/2022 5:34:39 PM By: Baruch Gouty RN, BSN Entered By: Baruch Gouty on 12/13/2022 09:49:53 -------------------------------------------------------------------------------- Nutrition Risk Screening Details Patient Name: Date of Service: Kenneth Hansen EL Green. 12/13/2022 9:00 A M Medical Record Number: [id number] Patient Account Number: [id number] Date of Birth/Sex: Treating RN: 23-Dec-1965 (57 y.o. [name] Primary Care Briane Birden: Leitha Bleak Other Clinician: Referring Vivion Romano: Treating Shahan Starks/Extender: Alver Fisher Weeks in Treatment: 0 Height (in): 69 Weight (lbs): 270 Body Mass Index (BMI): 39.9 Nutrition Risk Screening Items Score Screening NUTRITION RISK SCREEN: I have an illness or condition that made me change the kind and/or amount of food I eat 0 No I eat fewer than two meals per day 0 No I eat few fruits and vegetables, or milk products 0 No I have three or more drinks of beer, liquor or wine almost every day 0 No I have tooth or mouth problems that make it hard for me to eat 0 No I don't always have enough money to buy the food I need 0 No I eat alone most of the time 0 No I take three or more different prescribed or over-the-counter drugs a day 1 Yes Without wanting to, I have lost or gained 10 pounds in the last six months 2 Yes I am not always physically able to shop, cook and/or feed myself 0 No Nutrition Protocols Good Risk Protocol Moderate Risk Protocol 0 Provide education on nutrition High Risk Proctocol Risk  Level: Moderate Risk Score: 3 Electronic Signature(s) Signed: 12/13/2022 5:34:39 PM By: Baruch Gouty RN, BSN Entered By: Baruch Gouty on 12/13/2022 09:48:54

## 2022-12-15 ENCOUNTER — Ambulatory Visit (INDEPENDENT_AMBULATORY_CARE_PROVIDER_SITE_OTHER): Payer: 59 | Admitting: Psychology

## 2022-12-15 DIAGNOSIS — F4323 Adjustment disorder with mixed anxiety and depressed mood: Secondary | ICD-10-CM

## 2022-12-15 NOTE — Progress Notes (Signed)
[facility] Counselor/Therapist Progress Note  [name], MRN: [id number],    Date: 12/15/2022  Time Spent: 45 mins  Treatment Type: Individual Therapy  Reported Symptoms: Pt presented via webex video for follow up session, granting consent for the session.  Pt stated he was in his home and no one else was present.  I shared with pt that I am in my office and no one else is present.  Mental Status Exam: Appearance:  Casual     Behavior: Appropriate  Motor: Normal  Speech/Language:  Clear and Coherent  Affect: Appropriate  Mood: normal  Thought process: normal  Thought content:   WNL  Sensory/Perceptual disturbances:   WNL  Orientation: oriented to person, place, and time/date  Attention: Good  Concentration: Good  Memory: WNL  Fund of knowledge:  Good  Insight:   Good  Judgment:  Good  Impulse Control: Good   Risk Assessment: Danger to Self:  No Self-injurious Behavior: No Danger to Others: No Duty to Warn:no Physical Aggression / Violence:No  Access to Firearms a concern: No  Gang Involvement:No   Subjective: Pt shares that "my knee is continuing to heal; I am now connected to the [facility] to try and get the incision healed; I see them weekly until it is healed."  Pt shares that [name] got her shoulder replaced in December and she said it was getting better.  Recently she has been complaining about it hurting again and the ortho is watching it.  She also "has a really bad eye infection that is requiring a lot of attention.  I missed a week of work because I had to help her do eye drops every hour, 24 hours per day."  Pt shares that one of the cars has been having trouble.  He will need to have someone look at that as well.  Pt shares that he feels stuck in most areas of his life and pt shares he is developing a sense of hopelessness and he, obviously, does not like that.  Pt shares that he has gained back the weight he had lost; his  last [facility] appt was in Dec.  He is following up with them in Feb.  Talked with pt about his history of negative self talk and pt shares he has been feeling like it may never end for him.  Talked with pt about tracking his incidents of negative self talk between now and our next session.  Pt went on his trip to [place], [place] but he learned that [name] was having trouble with her eye; his daughter took her to the ED and pt had to cut his trip short to come back home.  Pt shares that work is "kind of weird and there is a lot going on."  Pt continues to hand some of his former duties off to the other person who is there for that purpose.  Pt is not sure when his next re-enactment is scheduled for; there are a couple of smaller events between now and the next big one at [facility] in March.  Pt shares that he has not been reading any more of the Big Book since our last session.  Encouraged pt to continue to read when he has time.  Asked pt to write down any questions for [place] to talk about next session.  We will meet in 3 wks for a follow up session.  Interventions: Cognitive Behavioral Therapy  Diagnosis:Adjustment disorder with mixed anxiety  and depressed mood  Plan: Treatment Plan Strengths/Abilities:  Intelligent, Intuitive, Willing to participate in therapy Treatment Preferences:  Outpatient Individual Therapy Statement of Needs:  Patient is to use CBT, mindfulness and coping skills to help manage and/or decrease symptoms associated with their diagnosis. Symptoms:  Depressed/Irritable mood, worry, social withdrawal Problems Addressed:  Depressive thoughts, Sadness, Sleep issues, etc. Long Term Goals:  Pt to reduce overall level, frequency, and intensity of the feelings of depression as evidenced by decreased irritability, negative self talk, and helpless feelings from 6 to 7 days/week to 0 to 1 days/week, per client report, for at least 3 consecutive months.  Progress: 20% Short Term Goals:  Pt  to verbally express understanding of the relationship between feelings of depression and their impact on thinking patterns and behaviors.  Pt to verbalize an understanding of the role that distorted thinking plays in creating fears, excessive worry, and ruminations.  Progress: 20% Target Date:  11/18/2023 Frequency:  Bi-weekly Modality:  Cognitive Behavioral Therapy Interventions by Therapist:  Therapist will use CBT, Mindfulness exercises, Coping skills and Referrals, as needed by client. Client has verbally approved this treatment plan.  Ivan Anchors, [facility]

## 2022-12-20 ENCOUNTER — Encounter (HOSPITAL_BASED_OUTPATIENT_CLINIC_OR_DEPARTMENT_OTHER): Payer: 59 | Admitting: General Surgery

## 2022-12-20 DIAGNOSIS — E11622 Type 2 diabetes mellitus with other skin ulcer: Secondary | ICD-10-CM | POA: Diagnosis not present

## 2022-12-20 NOTE — Progress Notes (Signed)
ORVIS, STANN ([id number]) [id number] Page 1 of 8 Visit Report for 12/20/2022 Arrival Information Details Patient Name: Date of Service: Kenneth Green 12/20/2022 7:30 A M Medical Record Number: [id number] Patient Account Number: [id number] Date of Birth/Sex: Treating RN: 07/30/1966 (57 y.o. Kenneth Green, Kenneth Green Primary Care Durant Scibilia: Kenneth Green Other Clinician: Referring Kenneth Green: Treating Kenneth Green/Extender: Kenneth Green Weeks in Treatment: 1 Visit Information History Since Last Visit Added or deleted any medications: No Patient Arrived: Ambulatory Any new allergies or adverse reactions: No Arrival Time: 07:42 Had a fall or experienced change in No Accompanied By: self activities of daily living that may affect Transfer Assistance: None risk of falls: Patient Identification Verified: Yes Signs or symptoms of abuse/neglect since last visito No Secondary Verification Process Completed: Yes Hospitalized since last visit: No Patient Requires Transmission-Based Precautions: No Implantable device outside of the clinic excluding No Patient Has Alerts: No cellular tissue based products placed in the center since last visit: Has Dressing in Place as Prescribed: Yes Pain Present Now: No Electronic Signature(s) Signed: 12/20/2022 5:57:45 PM By: Baruch Gouty RN, BSN Entered By: Baruch Gouty on 12/20/2022 07:43:49 -------------------------------------------------------------------------------- Clinic Level of Care Assessment Details Patient Name: Date of Service: Kenneth Green. 12/20/2022 7:30 A M Medical Record Number: [id number] Patient Account Number: [id number] Date of Birth/Sex: Treating RN: 02-Apr-1966 (57 y.o. Kenneth Green Primary Care Quindon Denker: Kenneth Green Other Clinician: Referring Roan Sawchuk: Treating Patt Steinhardt/Extender: Kenneth Green Weeks in Treatment: 1 Clinic Level of Care  Assessment Items TOOL 4 Quantity Score '[]'$  - 0 Use when only an EandM is performed on FOLLOW-UP visit ASSESSMENTS - Nursing Assessment / Reassessment X- 1 10 Reassessment of Co-morbidities (includes updates in patient status) X- 1 5 Reassessment of Adherence to Treatment Plan ASSESSMENTS - Wound and Skin A ssessment / Reassessment X - Simple Wound Assessment / Reassessment - one wound 1 5 '[]'$  - 0 Complex Wound Assessment / Reassessment - multiple wounds '[]'$  - 0 Dermatologic / Skin Assessment (not related to wound area) ASSESSMENTS - Focused Assessment '[]'$  - 0 Circumferential Edema Measurements - multi extremities '[]'$  - 0 Nutritional Assessment / Counseling / Intervention KI, CORBO ([id number]) [id number] Page 2 of 8 '[]'$  - 0 Lower Extremity Assessment (monofilament, tuning fork, pulses) '[]'$  - 0 Peripheral Arterial Disease Assessment (using hand held doppler) ASSESSMENTS - Ostomy and/or Continence Assessment and Care '[]'$  - 0 Incontinence Assessment and Management '[]'$  - 0 Ostomy Care Assessment and Management (repouching, etc.) PROCESS - Coordination of Care X - Simple Patient / Family Education for ongoing care 1 15 '[]'$  - 0 Complex (extensive) Patient / Family Education for ongoing care X- 1 10 Staff obtains Programmer, systems, Records, T Results / Process Orders est '[]'$  - 0 Staff telephones HHA, Nursing Homes / Clarify orders / etc '[]'$  - 0 Routine Transfer to another Facility (non-emergent condition) '[]'$  - 0 Routine Hospital Admission (non-emergent condition) '[]'$  - 0 New Admissions / Biomedical engineer / Ordering NPWT Apligraf, etc. , '[]'$  - 0 Emergency Hospital Admission (emergent condition) X- 1 10 Simple Discharge Coordination '[]'$  - 0 Complex (extensive) Discharge Coordination PROCESS - Special Needs '[]'$  - 0 Pediatric / Minor Patient Management '[]'$  - 0 Isolation Patient Management '[]'$  - 0 Hearing / Language / Visual special needs '[]'$  -  0 Assessment of Community assistance (transportation, D/C planning, etc.) '[]'$  - 0 Additional assistance / Altered mentation '[]'$  - 0 Support Surface(s) Assessment (bed, cushion, seat, etc.) INTERVENTIONS - Wound  Cleansing / Measurement X - Simple Wound Cleansing - one wound 1 5 '[]'$  - 0 Complex Wound Cleansing - multiple wounds X- 1 5 Wound Imaging (photographs - any number of wounds) '[]'$  - 0 Wound Tracing (instead of photographs) X- 1 5 Simple Wound Measurement - one wound '[]'$  - 0 Complex Wound Measurement - multiple wounds INTERVENTIONS - Wound Dressings X - Small Wound Dressing one or multiple wounds 1 10 '[]'$  - 0 Medium Wound Dressing one or multiple wounds '[]'$  - 0 Large Wound Dressing one or multiple wounds '[]'$  - 0 Application of Medications - topical '[]'$  - 0 Application of Medications - injection INTERVENTIONS - Miscellaneous '[]'$  - 0 External ear exam '[]'$  - 0 Specimen Collection (cultures, biopsies, blood, body fluids, etc.) '[]'$  - 0 Specimen(s) / Culture(s) sent or taken to Lab for analysis '[]'$  - 0 Patient Transfer (multiple staff / Civil Service fast streamer / Similar devices) '[]'$  - 0 Simple Staple / Suture removal (25 or less) '[]'$  - 0 Complex Staple / Suture removal (26 or more) '[]'$  - 0 Hypo / Hyperglycemic Management (close monitor of Blood Glucose) Kenneth, ROTHLISBERGER Green ([id number]) [id number] Page 3 of 8 '[]'$  - 0 Ankle / Brachial Index (ABI) - do not check if billed separately X- 1 5 Vital Signs Has the patient been seen at the hospital within the last three years: Yes Total Score: 85 Level Of Care: New/Established - Level 3 Electronic Signature(s) Signed: 12/20/2022 5:57:45 PM By: Baruch Gouty RN, BSN Entered By: Baruch Gouty on 12/20/2022 08:04:55 -------------------------------------------------------------------------------- Encounter Discharge Information Details Patient Name: Date of Service: Kenneth Green. 12/20/2022 7:30 A M Medical Record  Number: [id number] Patient Account Number: [id number] Date of Birth/Sex: Treating RN: 1966-10-13 (57 y.o. Kenneth Green Primary Care Chaley Castellanos: Kenneth Green Other Clinician: Referring Amayia Ciano: Treating Butler Vegh/Extender: Kenneth Green Weeks in Treatment: 1 Encounter Discharge Information Items Discharge Condition: Stable Ambulatory Status: Ambulatory Discharge Destination: Home Transportation: Private Auto Accompanied By: self Schedule Follow-up Appointment: Yes Clinical Summary of Care: Patient Declined Electronic Signature(s) Signed: 12/20/2022 5:57:45 PM By: Baruch Gouty RN, BSN Entered By: Baruch Gouty on 12/20/2022 08:05:32 -------------------------------------------------------------------------------- Lower Extremity Assessment Details Patient Name: Date of Service: Kenneth Green. 12/20/2022 7:30 A M Medical Record Number: [id number] Patient Account Number: [id number] Date of Birth/Sex: Treating RN: 07-31-1966 (57 y.o. Kenneth Green Primary Care Montague Corella: Kenneth Green Other Clinician: Referring Lashun Mccants: Treating Ember Henrikson/Extender: Kenneth Green Weeks in Treatment: 1 Edema Assessment Assessed: [Left: No] [Right: No] Edema: [Left: N] [Right: o] Vascular Assessment Pulses: Dorsalis Pedis Palpable: [Right:Yes] Electronic Signature(s) Signed: 12/20/2022 5:57:45 PM By: Baruch Gouty RN, BSN Entered By: Baruch Gouty on 12/20/2022 07:45:04 Janalyn Shy ([id number]) [id number] Page 4 of 8 -------------------------------------------------------------------------------- Multi Wound Chart Details Patient Name: Date of Service: Kenneth Green 12/20/2022 7:30 A M Medical Record Number: [id number] Patient Account Number: [id number] Date of Birth/Sex: Treating RN: May 05, 1966 (57 y.o. M) Primary Care Raven Furnas: Kenneth Green Other Clinician: Referring Llana Deshazo: Treating  Mora Pedraza/Extender: Kenneth Green Weeks in Treatment: 1 Vital Signs Height(in): 69 Pulse(bpm): 39 Weight(lbs): 270 Blood Pressure(mmHg): 144/88 Body Mass Index(BMI): 39.9 Temperature(F): 97.8 Respiratory Rate(breaths/min): 18 [1:Photos:] [N/A:N/A] Right, Anterior Knee N/A N/A Wound Location: Surgical Injury N/A N/A Wounding Event: Dehisced Wound N/A N/A Primary Etiology: Glaucoma, Sleep Apnea, N/A N/A Comorbid History: Hypertension, Type II Diabetes 10/28/2022 N/A N/A Date Acquired: 1 N/A N/A Weeks of Treatment: Open N/A N/A Wound Status: No  N/A N/A Wound Recurrence: 2.3x0.2x0.2 N/A N/A Measurements L x W x D (cm) 0.361 N/A N/A A (cm) : rea 0.072 N/A N/A Volume (cm) : 52.10% N/A N/A % Reduction in A rea: 52.30% N/A N/A % Reduction in Volume: 12 Position 1 (o'clock): 0.6 Maximum Distance 1 (cm): Yes N/A N/A Tunneling: Full Thickness Without Exposed N/A N/A Classification: Support Structures Medium N/A N/A Exudate Amount: Serosanguineous N/A N/A Exudate Type: red, brown N/A N/A Exudate Color: Distinct, outline attached N/A N/A Wound Margin: Large (67-100%) N/A N/A Granulation Amount: Red N/A N/A Granulation Quality: None Present (0%) N/A N/A Necrotic Amount: Fat Layer (Subcutaneous Tissue): Yes N/A N/A Exposed Structures: Fascia: No Tendon: No Muscle: No Joint: No Bone: No None N/A N/A Epithelialization: Scarring: No N/A N/A Periwound Skin Texture: No Abnormalities Noted N/A N/A Periwound Skin Moisture: Rubor: No N/A N/A Periwound Skin Color: No Abnormality N/A N/A Temperature: Treatment Notes Electronic Signature(s) Signed: 12/20/2022 7:57:28 AM By: Fredirick Maudlin MD FACS Janalyn Shy ([id number]) [id number] Page 5 of 8 Entered By: Fredirick Maudlin on 12/20/2022 07:57:28 -------------------------------------------------------------------------------- Multi-Disciplinary Care Plan  Details Patient Name: Date of Service: Kenneth Green 12/20/2022 7:30 A M Medical Record Number: [id number] Patient Account Number: [id number] Date of Birth/Sex: Treating RN: 05-Oct-1966 (57 y.o. Kenneth Green Primary Care Ramces Shomaker: Kenneth Green Other Clinician: Referring Nova Evett: Treating Davonda Ausley/Extender: Kenneth Green Weeks in Treatment: [place] reviewed with physician Active Inactive Wound/Skin Impairment Nursing Diagnoses: Impaired tissue integrity Knowledge deficit related to ulceration/compromised skin integrity Goals: Patient/caregiver will verbalize understanding of skin care regimen Date Initiated: 12/13/2022 Target Resolution Date: 01/10/2023 Goal Status: Active Ulcer/skin breakdown will have a volume reduction of 30% by week 4 Date Initiated: 12/13/2022 Target Resolution Date: 01/10/2023 Goal Status: Active Interventions: Assess patient/caregiver ability to obtain necessary supplies Assess patient/caregiver ability to perform ulcer/skin care regimen upon admission and as needed Assess ulceration(s) every visit Provide education on ulcer and skin care Treatment Activities: Skin care regimen initiated : 12/13/2022 Topical wound management initiated : 12/13/2022 Notes: Electronic Signature(s) Signed: 12/20/2022 5:57:45 PM By: Baruch Gouty RN, BSN Entered By: Baruch Gouty on 12/20/2022 07:50:38 -------------------------------------------------------------------------------- Pain Assessment Details Patient Name: Date of Service: Kenneth Green. 12/20/2022 7:30 A M Medical Record Number: [id number] Patient Account Number: [id number] Date of Birth/Sex: Treating RN: [birth year] (58 y.o. Kenneth Green Primary Care Jaylenn Altier: Kenneth Green Other Clinician: Referring Bayley Hurn: Treating Deforrest Bogle/Extender: Kenneth Green Weeks in Treatment: 1 Active Problems Location of Pain Severity and  Description of Pain Patient Has Paino No JAHSIAH, CARPENTER ([id number]) [phone].pdf Page 6 of 8 Patient Has Paino No Site Locations Rate the pain. Current Pain Level: 0 Pain Management and Medication Current Pain Management: Electronic Signature(s) Signed: 12/20/2022 5:57:45 PM By: Baruch Gouty RN, BSN Entered By: Baruch Gouty on 12/20/2022 07:44:24 -------------------------------------------------------------------------------- Patient/Caregiver Education Details Patient Name: Date of Service: Kenneth Green 1/29/2024andnbsp7:30 A M Medical Record Number: [id number] Patient Account Number: [id number] Date of Birth/Gender: Treating RN: 06-Sep-1966 (57 y.o. Kenneth Green Primary Care Physician: Kenneth Green Other Clinician: Referring Physician: Treating Physician/Extender: Kenneth Green Weeks in Treatment: 1 Education Assessment Education Provided To: Patient Education Topics Provided Wound/Skin Impairment: Methods: Explain/Verbal Responses: Reinforcements needed, State content correctly Electronic Signature(s) Signed: 12/20/2022 5:57:45 PM By: Baruch Gouty RN, BSN Entered By: Baruch Gouty on 12/20/2022 07:51:06 -------------------------------------------------------------------------------- Wound Assessment Details Patient Name: Date of Service: Kenneth Green, Kenneth EL Green.  12/20/2022 7:30 A M Medical Record Number: [id number] Patient Account Number: [id number] Date of Birth/Sex: Treating RN: 1966/05/05 (57 y.o. Kenneth Green, Kenneth Green, Kenneth Green ([id number]) [id number] Page 7 of 8 Primary Care Tyrick Dunagan: Kenneth Green Other Clinician: Referring Vitaliy Eisenhour: Treating Kymani Laursen/Extender: Kenneth Green Weeks in Treatment: 1 Wound Status Wound Number: 1 Primary Etiology: Dehisced Wound Wound Location: Right, Anterior Knee Wound Status: Open Wounding Event: Surgical  Injury Comorbid History: Glaucoma, Sleep Apnea, Hypertension, Type II Diabetes Date Acquired: 10/28/2022 Weeks Of Treatment: 1 Clustered Wound: No Photos Wound Measurements Length: (cm) Width: (cm) Depth: (cm) Area: (cm) Volume: (cm) 2.3 % Reduction in Area: 52.1% 0.2 % Reduction in Volume: 52.3% 0.2 Epithelialization: None 0.361 Tunneling: Yes 0.072 Position (o'clock): 12 Maximum Distance: (cm) 0.6 Undermining: No Wound Description Classification: Full Thickness Without Exposed Suppor Wound Margin: Distinct, outline attached Exudate Amount: Medium Exudate Type: Serosanguineous Exudate Color: red, brown t Structures Foul Odor After Cleansing: No Slough/Fibrino No Wound Bed Granulation Amount: Large (67-100%) Exposed Structure Granulation Quality: Red Fascia Exposed: No Necrotic Amount: None Present (0%) Fat Layer (Subcutaneous Tissue) Exposed: Yes Tendon Exposed: No Muscle Exposed: No Joint Exposed: No Bone Exposed: No Periwound Skin Texture Texture Color No Abnormalities Noted: Yes No Abnormalities Noted: Yes Moisture Temperature / Pain No Abnormalities Noted: Yes Temperature: No Abnormality Treatment Notes Wound #1 (Knee) Wound Laterality: Right, Anterior Cleanser Peri-Wound Care Topical Primary Dressing Sorbalgon AG Dressing 2x2 (in/in) Discharge Instruction: Apply to wound bed as instructed Secondary Dressing Zetuvit Plus Silicone Border Dressing 4x4 (in/inDAMONTAY, ALRED ([id number]) [id number] Page 8 of 8 Discharge Instruction: Apply silicone border over primary dressing as directed. Secured With Compression Wrap Compression Stockings Environmental education officer) Signed: 12/20/2022 5:57:45 PM By: Baruch Gouty RN, BSN Entered By: Baruch Gouty on 12/20/2022 07:50:20 -------------------------------------------------------------------------------- [place] Details Patient Name: Date of Service: Kenneth Hansen EL  Green. 12/20/2022 7:30 A M Medical Record Number: [id number] Patient Account Number: [id number] Date of Birth/Sex: Treating RN: 04-13-1966 (57 y.o. Kenneth Green Primary Care Ali Mclaurin: Kenneth Green Other Clinician: Referring Michaelangelo Mittelman: Treating Inis Borneman/Extender: Kenneth Green Weeks in Treatment: 1 Vital Signs Time Taken: 07:43 Temperature (F): 97.8 Height (in): 69 Pulse (bpm): 69 Weight (lbs): 270 Respiratory Rate (breaths/min): 18 Body Mass Index (BMI): 39.9 Blood Pressure (mmHg): 144/88 Reference Range: 80 - 120 mg / dl Electronic Signature(s) Signed: 12/20/2022 5:57:45 PM By: Baruch Gouty RN, BSN Entered By: Baruch Gouty on 12/20/2022 07:44:16

## 2022-12-20 NOTE — Progress Notes (Addendum)
BOBY, EYER ([id number]) [id number] Page 1 of 7 Visit Report for 12/20/2022 Chief Complaint Document Details Patient Name: Date of Service: Kenneth Green 12/20/2022 7:30 A M Medical Record Number: [id number] Patient Account Number: [id number] Date of Birth/Sex: Treating RN: 03-23-1966 (57 y.o. M) Primary Care Provider: Leitha Bleak Other Clinician: Referring Provider: Treating Provider/Extender: Benedict Needy Weeks in Treatment: 1 Information Obtained from: Patient Chief Complaint Patient presents to the wound care center with open non-healing surgical wound(s) Electronic Signature(s) Signed: 12/20/2022 7:57:34 AM By: Fredirick Maudlin MD FACS Entered By: Fredirick Maudlin on 12/20/2022 07:57:34 -------------------------------------------------------------------------------- HPI Details Patient Name: Date of Service: Kenneth Hansen EL E. 12/20/2022 7:30 A M Medical Record Number: [id number] Patient Account Number: [id number] Date of Birth/Sex: Treating RN: May 09, 1966 (57 y.o. M) Primary Care Provider: Leitha Bleak Other Clinician: Referring Provider: Treating Provider/Extender: Benedict Needy Weeks in Treatment: 1 History of Present Illness HPI Description: ADMISSION 12/13/2022 This is a 57 year old man with a past medical history of "prediabetes" (hemoglobin A1c 5.7), morbid obesity, and hypertension. He had chronic bursitis in his right knee that had been drained on multiple occasions and ultimately he underwent a prepatellar bursectomy on October 21, 2022. Unfortunately, the wound dehisced. Initially, it was reapproximated with Steri-Strips, but this failed and he has just been applying a bandage or leaving it open to air. He showed the photographs over the course of the couple of months since his operation and there has been considerable wound closure since that time, but due to the long duration  since his surgery, his orthopedic surgeon referred him to the wound care center for further evaluation and management. On the anterior aspect of his right knee, there is a linear wound, consistent with prior surgical intervention. There is some eschar accumulation around the edges and slough on the wound surface. There is no erythema, induration, malodor, or purulent drainage to suggest infection. 12/20/2022: The wound has contracted considerably. There is still a little bit of depth to it but the surface is extremely clean. Electronic Signature(s) Signed: 12/20/2022 7:58:04 AM By: Fredirick Maudlin MD FACS Entered By: Fredirick Maudlin on 12/20/2022 07:58:04 Physical Exam Details -------------------------------------------------------------------------------- Kenneth Green ([id number]) [id number] Page 2 of 7 Patient Name: Date of Service: Kenneth Green 12/20/2022 7:30 A M Medical Record Number: [id number] Patient Account Number: [id number] Date of Birth/Sex: Treating RN: 12/09/1965 (57 y.o. M) Primary Care Provider: Leitha Bleak Other Clinician: Referring Provider: Treating Provider/Extender: Benedict Needy Weeks in Treatment: 1 Constitutional Slightly hypertensive. . . . no acute distress. Respiratory . Notes 12/20/2022: The wound has contracted considerably. There is still a little bit of depth to it but the surface is extremely clean. Electronic Signature(s) Signed: 12/20/2022 7:58:41 AM By: Fredirick Maudlin MD FACS Entered By: Fredirick Maudlin on 12/20/2022 07:58:40 -------------------------------------------------------------------------------- Physician Orders Details Patient Name: Date of Service: Kenneth Hansen EL E. 12/20/2022 7:30 A M Medical Record Number: [id number] Patient Account Number: [id number] Date of Birth/Sex: Treating RN: September 15, 1966 (57 y.o. [name], [name] Primary Care Provider: Leitha Bleak Other  Clinician: Referring Provider: Treating Provider/Extender: Benedict Needy Weeks in Treatment: 1 Verbal / Phone Orders: No Diagnosis Coding ICD-10 Coding Code Description [phone] Non-pressure chronic ulcer of other part of right lower leg with fat layer exposed T81.31XS Disruption of external operation (surgical) wound, not elsewhere classified, sequela E66.01 Morbid (severe) obesity due to excess calories E11.69 Type 2 diabetes mellitus  with other specified complication Z61 Essential (primary) hypertension F32.A Depression, unspecified Follow-up Appointments ppointment in 1 week. - Dr. Celine Ahr RM 1 Return A Monday 2/5 @ 3:30 pm Anesthetic Wound #1 Right,Anterior Knee (In clinic) Topical Lidocaine 4% applied to wound bed Bathing/ Shower/ Hygiene May shower and wash wound with soap and water. Wound Treatment Wound #1 - Knee Wound Laterality: Right, Anterior Prim Dressing: Sorbalgon AG Dressing 2x2 (in/in) 1 x Per Day/30 Days ary Discharge Instructions: Apply to wound bed as instructed Secondary Dressing: Zetuvit Plus Silicone Border Dressing 4x4 (in/in) (Generic) 1 x Per Day/30 Days Discharge Instructions: Apply silicone border over primary dressing as directed. Electronic Signature(s) RAEF, SPRIGG ([id number]) [id number] Page 3 of 7 Signed: 12/20/2022 9:39:22 AM By: Fredirick Maudlin MD FACS Signed: 12/20/2022 5:57:45 PM By: Baruch Gouty RN, BSN Previous Signature: 12/20/2022 7:58:47 AM Version By: Fredirick Maudlin MD FACS Entered By: Baruch Gouty on 12/20/2022 07:59:45 -------------------------------------------------------------------------------- Problem List Details Patient Name: Date of Service: Kenneth Hansen EL E. 12/20/2022 7:30 A M Medical Record Number: [id number] Patient Account Number: [id number] Date of Birth/Sex: Treating RN: [birth year] (58 y.o. M) Primary Care Provider: Leitha Bleak Other  Clinician: Referring Provider: Treating Provider/Extender: Benedict Needy Weeks in Treatment: 1 Active Problems ICD-10 Encounter Code Description Active Date MDM Diagnosis [id number] Non-pressure chronic ulcer of other part of right lower leg with fat layer 12/13/2022 No Yes exposed T81.31XS Disruption of external operation (surgical) wound, not elsewhere classified, 12/13/2022 No Yes sequela E66.01 Morbid (severe) obesity due to excess calories 12/13/2022 No Yes E11.69 Type 2 diabetes mellitus with other specified complication 0/86/5784 No Yes I10 Essential (primary) hypertension 12/13/2022 No Yes F32.A Depression, unspecified 12/13/2022 No Yes Inactive Problems Resolved Problems Electronic Signature(s) Signed: 12/20/2022 7:57:17 AM By: Fredirick Maudlin MD FACS Entered By: Fredirick Maudlin on 12/20/2022 07:57:17 -------------------------------------------------------------------------------- Progress Note Details Patient Name: Date of Service: Kenneth Hansen EL E. 12/20/2022 7:30 A M Medical Record Number: [id number] Patient Account Number: [id number] Date of Birth/Sex: Treating RN: [birth year] (57 y.o. Kenneth Green, Kenneth Green ([id number]) [id number] Page 4 of 7 Primary Care Provider: Leitha Bleak Other Clinician: Referring Provider: Treating Provider/Extender: Benedict Needy Weeks in Treatment: 1 Subjective Chief Complaint Information obtained from Patient Patient presents to the wound care center with open non-healing surgical wound(s) History of Present Illness (HPI) ADMISSION 12/13/2022 This is a 57 year old man with a past medical history of "prediabetes" (hemoglobin A1c 5.7), morbid obesity, and hypertension. He had chronic bursitis in his right knee that had been drained on multiple occasions and ultimately he underwent a prepatellar bursectomy on October 21, 2022. Unfortunately, the wound dehisced. Initially,  it was reapproximated with Steri-Strips, but this failed and he has just been applying a bandage or leaving it open to air. He showed the photographs over the course of the couple of months since his operation and there has been considerable wound closure since that time, but due to the long duration since his surgery, his orthopedic surgeon referred him to the wound care center for further evaluation and management. On the anterior aspect of his right knee, there is a linear wound, consistent with prior surgical intervention. There is some eschar accumulation around the edges and slough on the wound surface. There is no erythema, induration, malodor, or purulent drainage to suggest infection. 12/20/2022: The wound has contracted considerably. There is still a little bit of depth to it but the surface is extremely clean. Patient History Information obtained from Patient,  Chart. Family History Diabetes - Siblings, Heart Disease - Siblings,Mother,Maternal Grandparents, Hypertension - Mother,Maternal Grandparents, Stroke - Father, No family history of Cancer, Hereditary Spherocytosis, Kidney Disease, Lung Disease, Thyroid Problems, Tuberculosis. Social History Former smoker, Marital Status - Married, Alcohol Use - Rarely, Drug Use - No History, Caffeine Use - Daily - coffee. Medical History Eyes Patient has history of Glaucoma Denies history of Cataracts, Optic Neuritis Respiratory Patient has history of Sleep Apnea - CPAP Cardiovascular Patient has history of Hypertension Endocrine Patient has history of Type II Diabetes Denies history of Type I Diabetes Genitourinary Denies history of End Stage Renal Disease Integumentary (Skin) Denies history of History of Burn Oncologic Denies history of Received Chemotherapy, Received Radiation Psychiatric Denies history of Anorexia/bulimia, Confinement Anxiety Hospitalization/Surgery History - spinal fusion. - bursa removal right knee  10/21/2022. Medical A Surgical History Notes nd Cardiovascular hyperlipidemia Neurologic spinal stenosis Objective Constitutional Slightly hypertensive. no acute distress. Vitals Time Taken: 7:43 AM, Height: 69 in, Weight: 270 lbs, BMI: 39.9, Temperature: 97.8 F, Pulse: 69 bpm, Respiratory Rate: 18 breaths/min, Blood Pressure: 144/88 mmHg. General Notes: 12/20/2022: The wound has contracted considerably. There is still a little bit of depth to it but the surface is extremely clean. Integumentary (Hair, Skin) Wound #1 status is Open. Original cause of wound was Surgical Injury. The date acquired was: 10/28/2022. The wound has been in treatment 1 weeks. The wound is located on the Right,Anterior Knee. The wound measures 2.3cm length x 0.2cm width x 0.2cm depth; 0.361cm^2 area and 0.072cm^3 volume. There is Kenneth Green, Kenneth Green ([id number]) [phone].pdf Page 5 of 7 Fat Layer (Subcutaneous Tissue) exposed. There is no undermining noted, however, there is tunneling at 12:00 with a maximum distance of 0.6cm. There is a medium amount of serosanguineous drainage noted. The wound margin is distinct with the outline attached to the wound base. There is large (67-100%) red granulation within the wound bed. There is no necrotic tissue within the wound bed. The periwound skin appearance had no abnormalities noted for texture. The periwound skin appearance had no abnormalities noted for moisture. The periwound skin appearance had no abnormalities noted for color. Periwound temperature was noted as No Abnormality. Assessment Active Problems ICD-10 Non-pressure chronic ulcer of other part of right lower leg with fat layer exposed Disruption of external operation (surgical) wound, not elsewhere classified, sequela Morbid (severe) obesity due to excess calories Type 2 diabetes mellitus with other specified complication Essential (primary) hypertension Depression,  unspecified Plan 12/20/2022: The wound has contracted considerably. There is still a little bit of depth to it but the surface is extremely clean. No debridement was necessary today. We will continue to apply silver alginate to the wound surface and cover the site with a foam border dressing. Follow-up in 1 week. I anticipate he will be closed in the next 2 to 3 weeks. Electronic Signature(s) Signed: 12/20/2022 7:59:21 AM By: Fredirick Maudlin MD FACS Entered By: Fredirick Maudlin on 12/20/2022 07:59:21 -------------------------------------------------------------------------------- HxROS Details Patient Name: Date of Service: Kenneth Hansen EL E. 12/20/2022 7:30 A M Medical Record Number: [id number] Patient Account Number: [id number] Date of Birth/Sex: Treating RN: 08-20-1966 (57 y.o. M) Primary Care Provider: Leitha Bleak Other Clinician: Referring Provider: Treating Provider/Extender: Benedict Needy Weeks in Treatment: 1 Information Obtained From Patient Chart Eyes Medical History: Positive for: Glaucoma Negative for: Cataracts; Optic Neuritis Respiratory Medical History: Positive for: Sleep Apnea - CPAP Cardiovascular Medical History: Positive for: Hypertension Past Medical History Notes: hyperlipidemia Endocrine Kenneth Green, Kenneth E (  [id number]) [id number] Page 6 of 7 Medical History: Positive for: Type II Diabetes Negative for: Type I Diabetes Time with diabetes: 2 months Treated with: Oral agents Blood sugar tested every day: Yes Tested : daily Genitourinary Medical History: Negative for: End Stage Renal Disease Integumentary (Skin) Medical History: Negative for: History of Burn Neurologic Medical History: Past Medical History Notes: spinal stenosis Oncologic Medical History: Negative for: Received Chemotherapy; Received Radiation Psychiatric Medical History: Negative for: Anorexia/bulimia; Confinement  Anxiety HBO Extended History Items Eyes: Glaucoma Immunizations Pneumococcal Vaccine: Received Pneumococcal Vaccination: No Implantable Devices None Hospitalization / Surgery History Type of Hospitalization/Surgery spinal fusion bursa removal right knee 10/21/2022 Family and Social History Cancer: No; Diabetes: Yes - Siblings; Heart Disease: Yes - Siblings,Mother,Maternal Grandparents; Hereditary Spherocytosis: No; Hypertension: Yes - Mother,Maternal Grandparents; Kidney Disease: No; Lung Disease: No; Stroke: Yes - Father; Thyroid Problems: No; Tuberculosis: No; Former smoker; Marital Status - Married; Alcohol Use: Rarely; Drug Use: No History; Caffeine Use: Daily - coffee; Financial Concerns: No; Food, Clothing or Shelter Needs: No; Support System Lacking: No; Transportation Concerns: No Electronic Signature(s) Signed: 12/20/2022 9:39:22 AM By: Fredirick Maudlin MD FACS Entered By: Fredirick Maudlin on 12/20/2022 07:58:10 -------------------------------------------------------------------------------- SuperBill Details Patient Name: Date of Service: Kenneth Hansen EL E. 12/20/2022 Medical Record Number: [id number] Patient Account Number: [id number] Date of Birth/Sex: Treating RN: 28-Nov-1965 (57 y.o. M) Primary Care Provider: Leitha Bleak Other Clinician: Referring Provider: Treating Provider/Extender: Kenneth Green, Kenneth Green ([id number]) [id number] Page 7 of 7 Weeks in Treatment: 1 Diagnosis Coding ICD-10 Codes Code Description [phone] Non-pressure chronic ulcer of other part of right lower leg with fat layer exposed T81.31XS Disruption of external operation (surgical) wound, not elsewhere classified, sequela E66.01 Morbid (severe) obesity due to excess calories E11.69 Type 2 diabetes mellitus with other specified complication V88 Essential (primary) hypertension F32.A Depression, unspecified Facility  Procedures : CPT4 Code: [id number] Description: 81594 - WOUND CARE VISIT-LEV 3 EST PT Modifier: Quantity: 1 Physician Procedures : CPT4 Code Description Modifier [id number] - WC PHYS LEVEL 3 - EST PT ICD-10 Diagnosis Description I34.373 Non-pressure chronic ulcer of other part of right lower leg with fat layer exposed T81.31XS Disruption of external operation (surgical) wound,  not elsewhere classified, sequela E66.01 Morbid (severe) obesity due to excess calories E11.69 Type 2 diabetes mellitus with other specified complication Quantity: 1 Electronic Signature(s) Signed: 12/20/2022 9:39:22 AM By: Fredirick Maudlin MD FACS Signed: 12/20/2022 5:57:45 PM By: Baruch Gouty RN, BSN Previous Signature: 12/20/2022 7:59:40 AM Version By: Fredirick Maudlin MD FACS Entered By: Baruch Gouty on 12/20/2022 08:05:04

## 2022-12-27 ENCOUNTER — Encounter (HOSPITAL_BASED_OUTPATIENT_CLINIC_OR_DEPARTMENT_OTHER): Payer: 59 | Attending: General Surgery | Admitting: General Surgery

## 2022-12-27 DIAGNOSIS — X58XXXS Exposure to other specified factors, sequela: Secondary | ICD-10-CM | POA: Insufficient documentation

## 2022-12-27 DIAGNOSIS — I10 Essential (primary) hypertension: Secondary | ICD-10-CM | POA: Diagnosis not present

## 2022-12-27 DIAGNOSIS — Z6839 Body mass index (BMI) 39.0-39.9, adult: Secondary | ICD-10-CM | POA: Insufficient documentation

## 2022-12-27 DIAGNOSIS — E785 Hyperlipidemia, unspecified: Secondary | ICD-10-CM | POA: Insufficient documentation

## 2022-12-27 DIAGNOSIS — L97919 Non-pressure chronic ulcer of unspecified part of right lower leg with unspecified severity: Secondary | ICD-10-CM | POA: Insufficient documentation

## 2022-12-27 DIAGNOSIS — Z981 Arthrodesis status: Secondary | ICD-10-CM | POA: Diagnosis not present

## 2022-12-27 DIAGNOSIS — E1151 Type 2 diabetes mellitus with diabetic peripheral angiopathy without gangrene: Secondary | ICD-10-CM | POA: Diagnosis not present

## 2022-12-27 DIAGNOSIS — E11622 Type 2 diabetes mellitus with other skin ulcer: Secondary | ICD-10-CM | POA: Insufficient documentation

## 2022-12-27 DIAGNOSIS — G4730 Sleep apnea, unspecified: Secondary | ICD-10-CM | POA: Insufficient documentation

## 2022-12-27 DIAGNOSIS — L97812 Non-pressure chronic ulcer of other part of right lower leg with fat layer exposed: Secondary | ICD-10-CM | POA: Insufficient documentation

## 2022-12-27 DIAGNOSIS — Z87891 Personal history of nicotine dependence: Secondary | ICD-10-CM | POA: Insufficient documentation

## 2022-12-27 DIAGNOSIS — F32A Depression, unspecified: Secondary | ICD-10-CM | POA: Diagnosis not present

## 2022-12-27 DIAGNOSIS — T8131XS Disruption of external operation (surgical) wound, not elsewhere classified, sequela: Secondary | ICD-10-CM | POA: Diagnosis not present

## 2022-12-28 ENCOUNTER — Ambulatory Visit: Payer: 59 | Admitting: Psychology

## 2022-12-28 DIAGNOSIS — F4323 Adjustment disorder with mixed anxiety and depressed mood: Secondary | ICD-10-CM

## 2022-12-28 NOTE — Progress Notes (Signed)
DEONTA, BOMBERGER ([id number]) [id number] Page 1 of 7 Visit Report for 12/27/2022 Arrival Information Details Patient Name: Date of Service: Kenneth Green 12/27/2022 3:30 PM Medical Record Number: [id number] Patient Account Number: [id number] Date of Birth/Sex: Treating RN: 11/13/1966 (57 y.o. Kenneth Green, [name] Primary Care Adonna Horsley: Leitha Bleak Other Clinician: Referring Clifton Kovacic: Treating Sade Mehlhoff/Extender: Benedict Needy Weeks in Treatment: 2 Visit Information History Since Last Visit Added or deleted any medications: No Patient Arrived: Ambulatory Any new allergies or adverse reactions: No Arrival Time: 15:16 Had a fall or experienced change in No Accompanied By: self activities of daily living that may affect Transfer Assistance: None risk of falls: Patient Identification Verified: Yes Signs or symptoms of abuse/neglect since last visito No Secondary Verification Process Completed: Yes Hospitalized since last visit: No Patient Requires Transmission-Based Precautions: No Implantable device outside of the clinic excluding No Patient Has Alerts: No cellular tissue based products placed in the center since last visit: Has Dressing in Place as Prescribed: Yes Pain Present Now: No Electronic Signature(s) Signed: 12/27/2022 4:57:00 PM By: Baruch Gouty RN, BSN Entered By: Baruch Gouty on 12/27/2022 15:18:10 -------------------------------------------------------------------------------- Encounter Discharge Information Details Patient Name: Date of Service: Kenneth Hansen EL Green. 12/27/2022 3:30 PM Medical Record Number: [id number] Patient Account Number: [id number] Date of Birth/Sex: Treating RN: 1966/01/02 (57 y.o. Kenneth Green Primary Care Tresa Jolley: Leitha Bleak Other Clinician: Referring Somaya Grassi: Treating Aidric Endicott/Extender: Benedict Needy Weeks in Treatment: 2 Encounter Discharge  Information Items Post Procedure Vitals Discharge Condition: Stable Temperature (F): 97.4 Ambulatory Status: Ambulatory Pulse (bpm): 62 Discharge Destination: Home Respiratory Rate (breaths/min): 18 Transportation: Private Auto Blood Pressure (mmHg): 143/89 Accompanied By: self Schedule Follow-up Appointment: Yes Clinical Summary of Care: Patient Declined Electronic Signature(s) Signed: 12/27/2022 4:57:00 PM By: Baruch Gouty RN, BSN Entered By: Baruch Gouty on 12/27/2022 15:46:51 Kenneth Green ([id number]) [id number] Page 2 of 7 -------------------------------------------------------------------------------- Lower Extremity Assessment Details Patient Name: Date of Service: Kenneth Green 12/27/2022 3:30 PM Medical Record Number: [id number] Patient Account Number: [id number] Date of Birth/Sex: Treating RN: 01-Jan-1966 (57 y.o. Kenneth Green Primary Care Mellina Benison: Leitha Bleak Other Clinician: Referring Makaylah Oddo: Treating Rosaelena Kemnitz/Extender: Benedict Needy Weeks in Treatment: 2 Edema Assessment Assessed: [Left: No] [Right: No] Edema: [Left: Yes] [Right: No] Electronic Signature(s) Signed: 12/27/2022 4:57:00 PM By: Baruch Gouty RN, BSN Entered By: Baruch Gouty on 12/27/2022 15:22:04 -------------------------------------------------------------------------------- Multi Wound Chart Details Patient Name: Date of Service: Kenneth Hansen EL Green. 12/27/2022 3:30 PM Medical Record Number: [id number] Patient Account Number: [id number] Date of Birth/Sex: Treating RN: 1966/08/09 (57 y.o. M) Primary Care Grey Rakestraw: Leitha Bleak Other Clinician: Referring Anilah Huck: Treating Peyton Rossner/Extender: Benedict Needy Weeks in Treatment: 2 Vital Signs Height(in): 69 Capillary Blood Glucose(mg/dl): 78 Weight(lbs): 270 Pulse(bpm): 28 Body Mass Index(BMI): 39.9 Blood Pressure(mmHg): 143/89 Temperature(F):  97.4 Respiratory Rate(breaths/min): 18 [1:Photos:] [N/A:N/A] Right, Anterior Knee N/A N/A Wound Location: Surgical Injury N/A N/A Wounding Event: Dehisced Wound N/A N/A Primary Etiology: Glaucoma, Sleep Apnea, N/A N/A Comorbid History: Hypertension, Type II Diabetes 10/28/2022 N/A N/A Date Acquired: 2 N/A N/A Weeks of Treatment: Open N/A N/A Wound Status: No N/A N/A Wound Recurrence: 1.5x0.1x0.1 N/A N/A Measurements L x W x D (cm) 0.118 N/A N/A A (cm) : rea 0.012 N/A N/A Volume (cm) : 84.40% N/A N/A % Reduction in A rea: 92.10% N/A N/A % Reduction in Volume: 12 Position 1 (o'clock): 0.3 Maximum Distance 1 (cm): Halt,  Kenneth Green ([id number]) [id number] Page 3 of 7 Yes N/A N/A Tunneling: Full Thickness Without Exposed N/A N/A Classification: Support Structures Small N/A N/A Exudate A mount: Serosanguineous N/A N/A Exudate Type: red, brown N/A N/A Exudate Color: Distinct, outline attached N/A N/A Wound Margin: Large (67-100%) N/A N/A Granulation A mount: Red N/A N/A Granulation Quality: Small (1-33%) N/A N/A Necrotic A mount: Eschar N/A N/A Necrotic Tissue: Fat Layer (Subcutaneous Tissue): Yes N/A N/A Exposed Structures: Fascia: No Tendon: No Muscle: No Joint: No Bone: No Small (1-33%) N/A N/A Epithelialization: Debridement - Selective/Open Wound N/A N/A Debridement: Pre-procedure Verification/Time Out 15:35 N/A N/A Taken: Lidocaine 4% Topical Solution N/A N/A Pain Control: Necrotic/Eschar, Slough N/A N/A Tissue Debrided: Non-Viable Tissue N/A N/A Level: 0.15 N/A N/A Debridement A (sq cm): rea Curette N/A N/A Instrument: Minimum N/A N/A Bleeding: Pressure N/A N/A Hemostasis A chieved: 0 N/A N/A Procedural Pain: 0 N/A N/A Post Procedural Pain: Procedure was tolerated well N/A N/A Debridement Treatment Response: 1.5x0.1x0.1 N/A N/A Post Debridement Measurements L x W x D (cm) 0.012 N/A N/A Post  Debridement Volume: (cm) Scarring: No N/A N/A Periwound Skin Texture: No Abnormalities Noted N/A N/A Periwound Skin Moisture: Rubor: No N/A N/A Periwound Skin Color: No Abnormality N/A N/A Temperature: Debridement N/A N/A Procedures Performed: Treatment Notes Electronic Signature(s) Signed: 12/27/2022 3:46:04 PM By: Fredirick Maudlin MD FACS Entered By: Fredirick Maudlin on 12/27/2022 15:46:04 -------------------------------------------------------------------------------- Multi-Disciplinary Care Plan Details Patient Name: Date of Service: Kenneth Hansen EL Green. 12/27/2022 3:30 PM Medical Record Number: [id number] Patient Account Number: [id number] Date of Birth/Sex: Treating RN: 1966-06-05 (57 y.o. Kenneth Green Primary Care Chaise Mahabir: Leitha Bleak Other Clinician: Referring Steel Kerney: Treating Vee Bahe/Extender: Benedict Needy Weeks in Treatment: 2 Multidisciplinary Care Plan reviewed with physician Active Inactive Wound/Skin Impairment Nursing Diagnoses: Impaired tissue integrity Knowledge deficit related to ulceration/compromised skin integrity Goals: Patient/caregiver will verbalize understanding of skin care regimen Date Initiated: 12/13/2022 Target Resolution Date: 01/10/2023 Goal Status: Active Kenneth Green, Kenneth Green ([id number]) [phone].pdf Page 4 of 7 Ulcer/skin breakdown will have a volume reduction of 30% by week 4 Date Initiated: 12/13/2022 Target Resolution Date: 01/10/2023 Goal Status: Active Interventions: Assess patient/caregiver ability to obtain necessary supplies Assess patient/caregiver ability to perform ulcer/skin care regimen upon admission and as needed Assess ulceration(s) every visit Provide education on ulcer and skin care Treatment Activities: Skin care regimen initiated : 12/13/2022 Topical wound management initiated : 12/13/2022 Notes: Electronic Signature(s) Signed: 12/27/2022 4:57:00 PM By: Baruch Gouty RN, BSN Entered By: Baruch Gouty on 12/27/2022 15:31:36 -------------------------------------------------------------------------------- Pain Assessment Details Patient Name: Date of Service: Kenneth Hansen EL Green. 12/27/2022 3:30 PM Medical Record Number: [id number] Patient Account Number: [id number] Date of Birth/Sex: Treating RN: 26-May-1966 (57 y.o. Kenneth Green Primary Care Mitchell Iwanicki: Leitha Bleak Other Clinician: Referring Airyanna Dipalma: Treating Aleida Crandell/Extender: Benedict Needy Weeks in Treatment: 2 Active Problems Location of Pain Severity and Description of Pain Patient Has Paino No Site Locations Rate the pain. Current Pain Level: 0 Pain Management and Medication Current Pain Management: Electronic Signature(s) Signed: 12/27/2022 4:57:00 PM By: Baruch Gouty RN, BSN Entered By: Baruch Gouty on 12/27/2022 15:21:41 Kenneth Green, Kenneth Green ([id number]) [id number] Page 5 of 7 -------------------------------------------------------------------------------- Patient/Caregiver Education Details Patient Name: Date of Service: Kenneth Green 2/5/2024andnbsp3:30 PM Medical Record Number: [id number] Patient Account Number: [id number] Date of Birth/Gender: Treating RN: Oct 01, 1966 (57 y.o. Kenneth Green Primary Care Physician: Leitha Bleak Other Clinician: Referring Physician: Treating Physician/Extender: [place] Ouch,  Mitchell Heir Weeks in Treatment: 2 Education Assessment Education Provided To: Patient Education Topics Provided Wound/Skin Impairment: Methods: Explain/Verbal Responses: Reinforcements needed, State content correctly [employer]) Signed: 12/27/2022 4:57:00 PM By: Baruch Gouty RN, BSN Entered By: Baruch Gouty on 12/27/2022 15:32:05 -------------------------------------------------------------------------------- Wound Assessment Details Patient Name: Date of Service: Kenneth Hansen EL Green. 12/27/2022 3:30 PM Medical Record Number: [id number] Patient Account Number: [id number] Date of Birth/Sex: Treating RN: 1966/06/09 (57 y.o. Kenneth Green Primary Care Gloriajean Okun: Leitha Bleak Other Clinician: Referring Najwa Spillane: Treating Kenneth Green/Extender: Benedict Needy Weeks in Treatment: 2 Wound Status Wound Number: 1 Primary Etiology: Dehisced Wound Wound Location: Right, Anterior Knee Wound Status: Open Wounding Event: Surgical Injury Comorbid History: Glaucoma, Sleep Apnea, Hypertension, Type II Diabetes Date Acquired: 10/28/2022 Weeks Of Treatment: 2 Clustered Wound: No Photos Wound Measurements Length: (cm) 1.5 Width: (cm) 0.1 Yankey, Kenneth Green ([id number]) Depth: (cm) 0 Area: (cm) Volume: (cm) % Reduction in Area: 84.4% % Reduction in Volume: 92.1% [id number] Page 6 of 7 .1 Epithelialization: Small (1-33%) 0.118 Tunneling: Yes 0.012 Position (o'clock): 12 Maximum Distance: (cm) 0.3 Wound Description Classification: Full Thickness Without Exposed Support Structures Wound Margin: Distinct, outline attached Exudate Amount: Small Exudate Type: Serosanguineous Exudate Color: red, brown Foul Odor After Cleansing: No Slough/Fibrino No Wound Bed Granulation Amount: Large (67-100%) Exposed Structure Granulation Quality: Red Fascia Exposed: No Necrotic Amount: Small (1-33%) Fat Layer (Subcutaneous Tissue) Exposed: Yes Necrotic Quality: Eschar Tendon Exposed: No Muscle Exposed: No Joint Exposed: No Bone Exposed: No Periwound Skin Texture Texture Color No Abnormalities Noted: Yes No Abnormalities Noted: Yes Moisture Temperature / Pain No Abnormalities Noted: Yes Temperature: No Abnormality Treatment Notes Wound #1 (Knee) Wound Laterality: Right, Anterior Cleanser Peri-Wound Care Topical Primary Dressing Sorbalgon AG Dressing 2x2 (in/in) Discharge Instruction: Apply to wound bed as  instructed Secondary Dressing Zetuvit Plus Silicone Border Dressing 4x4 (in/in) Discharge Instruction: Apply silicone border over primary dressing as directed. Secured With Compression Wrap Compression Stockings Environmental education officer) Signed: 12/27/2022 4:57:00 PM By: Baruch Gouty RN, BSN Entered By: Baruch Gouty on 12/27/2022 15:28:38 -------------------------------------------------------------------------------- Vitals Details Patient Name: Date of Service: Kenneth Hansen EL Green. 12/27/2022 3:30 PM Medical Record Number: [id number] Patient Account Number: [id number] Date of Birth/Sex: Treating RN: 1966-03-07 (57 y.o. Kenneth Green Primary Care Dyna Figuereo: Leitha Bleak Other Clinician: Referring Guthrie Lemme: Treating Valeda Corzine/Extender: Benedict Needy Weeks in Treatment: 2 Vital Signs Kenneth, Green ([id number]) [id number] Page 7 of 7 Time Taken: 15:20 Temperature (F): 97.4 Height (in): 69 Pulse (bpm): 62 Weight (lbs): 270 Respiratory Rate (breaths/min): 18 Body Mass Index (BMI): 39.9 Blood Pressure (mmHg): 143/89 Capillary Blood Glucose (mg/dl): 78 Reference Range: 80 - 120 mg / dl Notes glucose per pt report this am Electronic Signature(s) Signed: 12/27/2022 4:57:00 PM By: Baruch Gouty RN, BSN Entered By: Baruch Gouty on 12/27/2022 15:21:32

## 2022-12-28 NOTE — Progress Notes (Signed)
[facility] Counselor/Therapist Progress Note  [name], MRN: [id number],    Date: 12/28/2022  Time Spent: 45 mins  Treatment Type: Individual Therapy  Reported Symptoms: Pt presented via webex video for follow up session, granting consent for the session.  Pt stated he was in his home and no one else was present.  I shared with pt that I am in my office and no one else is present.  Mental Status Exam: Appearance:  Casual     Behavior: Appropriate  Motor: Normal  Speech/Language:  Clear and Coherent  Affect: Appropriate  Mood: normal  Thought process: normal  Thought content:   WNL  Sensory/Perceptual disturbances:   WNL  Orientation: oriented to person, place, and time/date  Attention: Good  Concentration: Good  Memory: WNL  Fund of knowledge:  Good  Insight:   Good  Judgment:  Good  Impulse Control: Good   Risk Assessment: Danger to Self:  No Self-injurious Behavior: No Danger to Others: No Duty to Warn:no Physical Aggression / Violence:No  Access to Firearms a concern: No  Gang Involvement:No   Subjective: Pt shares that "I have been OK; I feel like my mood has been a little better."  Asked pt what he attributes that to and he shares he has been going to a Men's group at church on Monday nights and that has been good for pt and he has enjoyed that activity.  Pt shares that his incision on his knee is almost healed up completely.  Pt also shares that [name] is doing a little better as well; her shoulder is not as painful as it had been and she is thankful for that.  Her eye infection "has been a long and lingering process.  She is still taking medicine, using eye drops, etc.  It is getting better.  She can still not see out of her left eye; her vision is improving in that eye."  Pt shares that work is going pretty good; "it has been kind of quiet recently which has been good."  Pt is able to settle into his new position now and is able to  focus on it.  He has his follow up appt with [facility] tomorrow and feels better about how he is doing with that; less snacking and better meals.  Pt shares he has "lost some weight recently."  Pt shares that is daughters are doing well; Joellen Jersey is happy and is doing well with the recent break up; she has a new friend.  Alissa and her husband and daughter are doing well as well.  Pt gets to keep his grand daughter on Mondays most of the time and he enjoys that time.  Pt shares that he is not feeling as stuck since our last session.  He attributes this change in mood to being able to attend church more regularly and plans to continue doing that.  Pt shares that he has only been drinking on the weekends and has been able to manage the amounts that he drinks very well.  He and Joellen Jersey still drink from time to time together on the weekends but they are more social with it.  Pt is being intentional about giving himself credit for working as hard as he is. Encouraged pt to continue with his self care activities and we will meet in 4 wks for a follow up session.  Interventions: Cognitive Behavioral Therapy  Diagnosis:Adjustment disorder with mixed anxiety and depressed mood  Plan: Treatment Plan  Strengths/Abilities:  Intelligent, Intuitive, Willing to participate in therapy Treatment Preferences:  Outpatient Individual Therapy Statement of Needs:  Patient is to use CBT, mindfulness and coping skills to help manage and/or decrease symptoms associated with their diagnosis. Symptoms:  Depressed/Irritable mood, worry, social withdrawal Problems Addressed:  Depressive thoughts, Sadness, Sleep issues, etc. Long Term Goals:  Pt to reduce overall level, frequency, and intensity of the feelings of depression as evidenced by decreased irritability, negative self talk, and helpless feelings from 6 to 7 days/week to 0 to 1 days/week, per client report, for at least 3 consecutive months.  Progress: 20% Short Term Goals:  Pt to  verbally express understanding of the relationship between feelings of depression and their impact on thinking patterns and behaviors.  Pt to verbalize an understanding of the role that distorted thinking plays in creating fears, excessive worry, and ruminations.  Progress: 20% Target Date:  11/18/2023 Frequency:  Bi-weekly Modality:  Cognitive Behavioral Therapy Interventions by Therapist:  Therapist will use CBT, Mindfulness exercises, Coping skills and Referrals, as needed by client. Client has verbally approved this treatment plan.  Ivan Anchors, [facility]

## 2022-12-28 NOTE — Progress Notes (Signed)
VAUN, HYNDMAN ([id number]) [id number] Page 1 of 8 Visit Report for 12/27/2022 Chief Complaint Document Details Patient Name: Date of Service: Kenneth Green 12/27/2022 3:30 PM Medical Record Number: [id number] Patient Account Number: [id number] Date of Birth/Sex: Treating RN: May 06, 1966 (57 y.o. M) Primary Care Provider: Leitha Bleak Other Clinician: Referring Provider: Treating Provider/Extender: Benedict Needy Weeks in Treatment: 2 Information Obtained from: Patient Chief Complaint Patient presents to the wound care center with open non-healing surgical wound(s) Electronic Signature(s) Signed: 12/27/2022 3:46:10 PM By: Fredirick Maudlin MD FACS Entered By: Fredirick Maudlin on 12/27/2022 15:46:10 -------------------------------------------------------------------------------- Debridement Details Patient Name: Date of Service: Kenneth Hansen EL E. 12/27/2022 3:30 PM Medical Record Number: [id number] Patient Account Number: [id number] Date of Birth/Sex: Treating RN: [birth year] (57 y.o. [name], Kenneth Green Primary Care Provider: Leitha Bleak Other Clinician: Referring Provider: Treating Provider/Extender: Benedict Needy Weeks in Treatment: 2 Debridement Performed for Assessment: Wound #1 Right,Anterior Knee Performed By: Physician Fredirick Maudlin, MD Debridement Type: Debridement Level of Consciousness (Pre-procedure): Awake and Alert Pre-procedure Verification/Time Out Yes - 15:35 Taken: Start Time: 15:35 Pain Control: Lidocaine 4% T opical Solution T Area Debrided (L x W): otal 1.5 (cm) x 0.1 (cm) = 0.15 (cm) Tissue and other material debrided: Non-Viable, Eschar, [place] Level: Non-Viable Tissue Debridement Description: Selective/Open Wound Instrument: Curette Bleeding: Minimum Hemostasis Achieved: Pressure Procedural Pain: 0 Post Procedural Pain: 0 Response to Treatment: Procedure was  tolerated well Level of Consciousness (Post- Awake and Alert procedure): Post Debridement Measurements of Total Wound Length: (cm) 1.5 Width: (cm) 0.1 Depth: (cm) 0.1 Volume: (cm) 0.012 Character of Wound/Ulcer Post Debridement: Improved Post Procedure Diagnosis Same as Kenneth Green, Kenneth Green ([id number]) [id number] Page 2 of 8 Notes scribed for Dr. Celine Ahr by Baruch Gouty, RN Electronic Signature(s) Signed: 12/27/2022 4:28:07 PM By: Fredirick Maudlin MD FACS Signed: 12/27/2022 4:57:00 PM By: Baruch Gouty RN, BSN Entered By: Baruch Gouty on 12/27/2022 15:38:56 -------------------------------------------------------------------------------- HPI Details Patient Name: Date of Service: Kenneth Hansen EL E. 12/27/2022 3:30 PM Medical Record Number: [id number] Patient Account Number: [id number] Date of Birth/Sex: Treating RN: 12-Mar-1966 (57 y.o. M) Primary Care Provider: Leitha Bleak Other Clinician: Referring Provider: Treating Provider/Extender: Benedict Needy Weeks in Treatment: 2 History of Present Illness HPI Description: ADMISSION 12/13/2022 This is a 57 year old man with a past medical history of "prediabetes" (hemoglobin A1c 5.7), morbid obesity, and hypertension. He had chronic bursitis in his right knee that had been drained on multiple occasions and ultimately he underwent a prepatellar bursectomy on October 21, 2022. Unfortunately, the wound dehisced. Initially, it was reapproximated with Steri-Strips, but this failed and he has just been applying a bandage or leaving it open to air. He showed the photographs over the course of the couple of months since his operation and there has been considerable wound closure since that time, but due to the long duration since his surgery, his orthopedic surgeon referred him to the wound care center for further evaluation and management. On the anterior aspect of his right  knee, there is a linear wound, consistent with prior surgical intervention. There is some eschar accumulation around the edges and slough on the wound surface. There is no erythema, induration, malodor, or purulent drainage to suggest infection. 12/20/2022: The wound has contracted considerably. There is still a little bit of depth to it but the surface is extremely clean. 12/27/2022: The wound is smaller again this week. Depth is minimal. Light slough  and eschar buildup. Electronic Signature(s) Signed: 12/27/2022 3:46:35 PM By: Fredirick Maudlin MD FACS Entered By: Fredirick Maudlin on 12/27/2022 15:46:35 -------------------------------------------------------------------------------- Physical Exam Details Patient Name: Date of Service: Kenneth Hansen EL E. 12/27/2022 3:30 PM Medical Record Number: [id number] Patient Account Number: [id number] Date of Birth/Sex: Treating RN: Jun 12, 1966 (58 y.o. M) Primary Care Provider: Leitha Bleak Other Clinician: Referring Provider: Treating Provider/Extender: Benedict Needy Weeks in Treatment: 2 Constitutional Slightly hypertensive. . . . no acute distress. Respiratory Normal work of breathing on room air. Notes 12/27/2022: The wound is smaller again this week. Depth is minimal. Light slough and eschar buildup. Electronic Signature(s) Kenneth Green, Kenneth Green ([id number]) [id number] Page 3 of 8 Signed: 12/27/2022 3:47:06 PM By: Fredirick Maudlin MD FACS Entered By: Fredirick Maudlin on 12/27/2022 15:47:05 -------------------------------------------------------------------------------- Physician Orders Details Patient Name: Date of Service: Kenneth Hansen EL E. 12/27/2022 3:30 PM Medical Record Number: [id number] Patient Account Number: [id number] Date of Birth/Sex: Treating RN: [birth year] (57 y.o. Kenneth Green Primary Care Provider: Leitha Bleak Other Clinician: Referring Provider: Treating  Provider/Extender: Benedict Needy Weeks in Treatment: 2 Verbal / Phone Orders: No Diagnosis Coding ICD-10 Coding Code Description [phone] Non-pressure chronic ulcer of other part of right lower leg with fat layer exposed T81.31XS Disruption of external operation (surgical) wound, not elsewhere classified, sequela E66.01 Morbid (severe) obesity due to excess calories E11.69 Type 2 diabetes mellitus with other specified complication J82 Essential (primary) hypertension F32.A Depression, unspecified Follow-up Appointments ppointment in 1 week. - Dr. Celine Ahr RM 1 Return A Tuesday 2/13 @ 7:30 am Anesthetic Wound #1 Right,Anterior Knee (In clinic) Topical Lidocaine 4% applied to wound bed Bathing/ Shower/ Hygiene May shower and wash wound with soap and water. Wound Treatment Wound #1 - Knee Wound Laterality: Right, Anterior Prim Dressing: Sorbalgon AG Dressing 2x2 (in/in) 1 x Per Day/30 Days ary Discharge Instructions: Apply to wound bed as instructed Secondary Dressing: Zetuvit Plus Silicone Border Dressing 4x4 (in/in) (Generic) 1 x Per Day/30 Days Discharge Instructions: Apply silicone border over primary dressing as directed. Electronic Signature(s) Signed: 12/27/2022 3:47:16 PM By: Fredirick Maudlin MD FACS Entered By: Fredirick Maudlin on 12/27/2022 15:47:16 -------------------------------------------------------------------------------- Problem List Details Patient Name: Date of Service: Kenneth Hansen EL E. 12/27/2022 3:30 PM Medical Record Number: [id number] Patient Account Number: [id number] Date of Birth/Sex: Treating RN: 02/13/1966 (57 y.o. Kenneth Green Primary Care Provider: Leitha Bleak Other Clinician: Referring Provider: Treating Provider/Extender: Benedict Needy Weeks in Treatment: 2 Kenneth Green, Kenneth Green ([id number]) [id number] Page 4 of 8 Active Problems ICD-10 Encounter Code Description Active  Date MDM Diagnosis L97.812 Non-pressure chronic ulcer of other part of right lower leg with fat layer 12/13/2022 No Yes exposed T81.31XS Disruption of external operation (surgical) wound, not elsewhere classified, 12/13/2022 No Yes sequela E66.01 Morbid (severe) obesity due to excess calories 12/13/2022 No Yes E11.69 Type 2 diabetes mellitus with other specified complication 0/97/3532 No Yes I10 Essential (primary) hypertension 12/13/2022 No Yes F32.A Depression, unspecified 12/13/2022 No Yes Inactive Problems Resolved Problems Electronic Signature(s) Signed: 12/27/2022 3:45:54 PM By: Fredirick Maudlin MD FACS Entered By: Fredirick Maudlin on 12/27/2022 15:45:54 -------------------------------------------------------------------------------- Progress Note Details Patient Name: Date of Service: Kenneth Hansen EL E. 12/27/2022 3:30 PM Medical Record Number: [id number] Patient Account Number: [id number] Date of Birth/Sex: Treating RN: 05-Mar-1966 (57 y.o. M) Primary Care Provider: Leitha Bleak Other Clinician: Referring Provider: Treating Provider/Extender: Benedict Needy Weeks in Treatment: 2 Subjective Chief Complaint Information  obtained from Patient Patient presents to the wound care center with open non-healing surgical wound(s) History of Present Illness (HPI) ADMISSION 12/13/2022 This is a 57 year old man with a past medical history of "prediabetes" (hemoglobin A1c 5.7), morbid obesity, and hypertension. He had chronic bursitis in his right knee that had been drained on multiple occasions and ultimately he underwent a prepatellar bursectomy on October 21, 2022. Unfortunately, the wound dehisced. Initially, it was reapproximated with Steri-Strips, but this failed and he has just been applying a bandage or leaving it open to air. He showed the photographs over the course of the couple of months since his operation and there has been considerable wound closure since  that time, but due to the long duration since his surgery, his orthopedic surgeon referred him to the wound care center for further evaluation and management. On the anterior aspect of his right knee, there is a linear wound, consistent with prior surgical intervention. There is some eschar accumulation around the edges and slough on the wound surface. There is no erythema, induration, malodor, or purulent drainage to suggest infection. Kenneth Green, Kenneth Green ([id number]) [id number] Page 5 of 8 12/20/2022: The wound has contracted considerably. There is still a little bit of depth to it but the surface is extremely clean. 12/27/2022: The wound is smaller again this week. Depth is minimal. Light slough and eschar buildup. Patient History Information obtained from Patient, Chart. Family History Diabetes - Siblings, Heart Disease - Siblings,Mother,Maternal Grandparents, Hypertension - Mother,Maternal Grandparents, Stroke - Father, No family history of Cancer, Hereditary Spherocytosis, Kidney Disease, Lung Disease, Thyroid Problems, Tuberculosis. Social History Former smoker, Marital Status - Married, Alcohol Use - Rarely, Drug Use - No History, Caffeine Use - Daily - coffee. Medical History Eyes Patient has history of Glaucoma Denies history of Cataracts, Optic Neuritis Respiratory Patient has history of Sleep Apnea - CPAP Cardiovascular Patient has history of Hypertension Endocrine Patient has history of Type II Diabetes Denies history of Type I Diabetes Genitourinary Denies history of End Stage Renal Disease Integumentary (Skin) Denies history of History of Burn Oncologic Denies history of Received Chemotherapy, Received Radiation Psychiatric Denies history of Anorexia/bulimia, Confinement Anxiety Hospitalization/Surgery History - spinal fusion. - bursa removal right knee 10/21/2022. Medical A Surgical History  Notes nd Cardiovascular hyperlipidemia Neurologic spinal stenosis Objective Constitutional Slightly hypertensive. no acute distress. Vitals Time Taken: 3:20 PM, Height: 69 in, Weight: 270 lbs, BMI: 39.9, Temperature: 97.4 F, Pulse: 62 bpm, Respiratory Rate: 18 breaths/min, Blood Pressure: 143/89 mmHg, Capillary Blood Glucose: 78 mg/dl. General Notes: glucose per pt report this am Respiratory Normal work of breathing on room air. General Notes: 12/27/2022: The wound is smaller again this week. Depth is minimal. Light slough and eschar buildup. Integumentary (Hair, Skin) Wound #1 status is Open. Original cause of wound was Surgical Injury. The date acquired was: 10/28/2022. The wound has been in treatment 2 weeks. The wound is located on the Right,Anterior Knee. The wound measures 1.5cm length x 0.1cm width x 0.1cm depth; 0.118cm^2 area and 0.012cm^3 volume. There is Fat Layer (Subcutaneous Tissue) exposed. There is tunneling at 12:00 with a maximum distance of 0.3cm. There is a small amount of serosanguineous drainage noted. The wound margin is distinct with the outline attached to the wound base. There is large (67-100%) red granulation within the wound bed. There is a small (1-33%) amount of necrotic tissue within the wound bed including Eschar. The periwound skin appearance had no abnormalities noted for texture. The periwound skin appearance had no abnormalities  noted for moisture. The periwound skin appearance had no abnormalities noted for color. Periwound temperature was noted as No Abnormality. Assessment Active Problems ICD-10 Non-pressure chronic ulcer of other part of right lower leg with fat layer exposed Disruption of external operation (surgical) wound, not elsewhere classified, sequela Morbid (severe) obesity due to excess calories Type 2 diabetes mellitus with other specified complication Kenneth Green, Kenneth Green ([id number]) [id number].pdf Page 6 of  8 Essential (primary) hypertension Depression, unspecified Procedures Wound #1 Pre-procedure diagnosis of Wound #1 is a Dehisced Wound located on the Right,Anterior Knee . There was a Selective/Open Wound Non-Viable Tissue Debridement with a total area of 0.15 sq cm performed by Fredirick Maudlin, MD. With the following instrument(s): Curette to remove Non-Viable tissue/material. Material removed includes Eschar and Slough and after achieving pain control using Lidocaine 4% T opical Solution. No specimens were taken. A time out was conducted at 15:35, prior to the start of the procedure. A Minimum amount of bleeding was controlled with Pressure. The procedure was tolerated well with a pain level of 0 throughout and a pain level of 0 following the procedure. Post Debridement Measurements: 1.5cm length x 0.1cm width x 0.1cm depth; 0.012cm^3 volume. Character of Wound/Ulcer Post Debridement is improved. Post procedure Diagnosis Wound #1: Same as Pre-Procedure General Notes: scribed for Dr. Celine Ahr by Baruch Gouty, RN. Plan Follow-up Appointments: Return Appointment in 1 week. - Dr. Celine Ahr RM 1 Tuesday 2/13 @ 7:30 am Anesthetic: Wound #1 Right,Anterior Knee: (In clinic) Topical Lidocaine 4% applied to wound bed Bathing/ Shower/ Hygiene: May shower and wash wound with soap and water. WOUND #1: - Knee Wound Laterality: Right, Anterior Prim Dressing: Sorbalgon AG Dressing 2x2 (in/in) 1 x Per Day/30 Days ary Discharge Instructions: Apply to wound bed as instructed Secondary Dressing: Zetuvit Plus Silicone Border Dressing 4x4 (in/in) (Generic) 1 x Per Day/30 Days Discharge Instructions: Apply silicone border over primary dressing as directed. 12/27/2022: The wound is smaller again this week. Depth is minimal. Light slough and eschar buildup. I used a curette to read slough and eschar from the wound. We will continue silver alginate and a foam border dressing. I anticipate that he will likely be  healed in the next 2 weeks or so. Follow-up in 1 week. Electronic Signature(s) Signed: 12/27/2022 3:48:06 PM By: Fredirick Maudlin MD FACS Entered By: Fredirick Maudlin on 12/27/2022 15:48:06 -------------------------------------------------------------------------------- HxROS Details Patient Name: Date of Service: Kenneth Hansen EL E. 12/27/2022 3:30 PM Medical Record Number: [id number] Patient Account Number: [id number] Date of Birth/Sex: Treating RN: 16-Dec-1965 (57 y.o. M) Primary Care Provider: Leitha Bleak Other Clinician: Referring Provider: Treating Provider/Extender: Benedict Needy Weeks in Treatment: 2 Information Obtained From Patient Chart Eyes Medical History: Positive for: Glaucoma Negative for: Cataracts; Optic Neuritis Respiratory Kenneth Green, Kenneth Green ([id number]) [phone].pdf Page 7 of 8 Medical History: Positive for: Sleep Apnea - CPAP Cardiovascular Medical History: Positive for: Hypertension Past Medical History Notes: hyperlipidemia Endocrine Medical History: Positive for: Type II Diabetes Negative for: Type I Diabetes Time with diabetes: 2 months Treated with: Oral agents Blood sugar tested every day: Yes Tested : daily Genitourinary Medical History: Negative for: End Stage Renal Disease Integumentary (Skin) Medical History: Negative for: History of Burn Neurologic Medical History: Past Medical History Notes: spinal stenosis Oncologic Medical History: Negative for: Received Chemotherapy; Received Radiation Psychiatric Medical History: Negative for: Anorexia/bulimia; Confinement Anxiety HBO Extended History Items Eyes: Glaucoma Immunizations Pneumococcal Vaccine: Received Pneumococcal Vaccination: No Implantable Devices None Hospitalization / Surgery History Type of Hospitalization/Surgery  spinal fusion bursa removal right knee 10/21/2022 Family and Social History Cancer: No; Diabetes: Yes  - Siblings; Heart Disease: Yes - Siblings,Mother,Maternal Grandparents; Hereditary Spherocytosis: No; Hypertension: Yes - Mother,Maternal Grandparents; Kidney Disease: No; Lung Disease: No; Stroke: Yes - Father; Thyroid Problems: No; Tuberculosis: No; Former smoker; Marital Status - Married; Alcohol Use: Rarely; Drug Use: No History; Caffeine Use: Daily - coffee; Financial Concerns: No; Food, Clothing or Shelter Needs: No; Support System Lacking: No; Transportation Concerns: No Electronic Signature(s) Signed: 12/27/2022 4:28:07 PM By: Fredirick Maudlin MD FACS Entered By: Fredirick Maudlin on 12/27/2022 15:46:42 Kenneth Green, Kenneth Green ([id number]) [id number] Page 8 of 8 -------------------------------------------------------------------------------- SuperBill Details Patient Name: Date of Service: Kenneth Green 12/27/2022 Medical Record Number: [id number] Patient Account Number: [id number] Date of Birth/Sex: Treating RN: 11-10-1966 (57 y.o. M) Primary Care Provider: Leitha Bleak Other Clinician: Referring Provider: Treating Provider/Extender: Benedict Needy Weeks in Treatment: 2 Diagnosis Coding ICD-10 Codes Code Description [phone] Non-pressure chronic ulcer of other part of right lower leg with fat layer exposed T81.31XS Disruption of external operation (surgical) wound, not elsewhere classified, sequela E66.01 Morbid (severe) obesity due to excess calories E11.69 Type 2 diabetes mellitus with other specified complication Q30 Essential (primary) hypertension F32.A Depression, unspecified Facility Procedures : CPT4 Code: [id number] Description: [id number] - DEBRIDE WOUND 1ST 20 SQ CM OR < ICD-10 Diagnosis Description F35.456 Non-pressure chronic ulcer of other part of right lower leg with fat layer expos T81.31XS Disruption of external operation (surgical) wound, not elsewhere  classified, seq Modifier: ed uela Quantity: 1 Physician  Procedures : CPT4 Code Description Modifier [id number] - WC PHYS LEVEL 3 - EST PT 25 ICD-10 Diagnosis Description L97.812 Non-pressure chronic ulcer of other part of right lower leg with fat layer exposed T81.31XS Disruption of external operation (surgical)  wound, not elsewhere classified, sequela E11.69 Type 2 diabetes mellitus with other specified complication J68.11 Morbid (severe) obesity due to excess calories Quantity: 1 : [id number] - WC PHYS DEBR WO ANESTH 20 SQ CM ICD-10 Diagnosis Description B63.845 Non-pressure chronic ulcer of other part of right lower leg with fat layer exposed T81.31XS Disruption of external operation (surgical) wound, not elsewhere  classified, sequela Quantity: 1 Electronic Signature(s) Signed: 12/27/2022 3:48:57 PM By: Fredirick Maudlin MD FACS Entered By: Fredirick Maudlin on 12/27/2022 15:48:57

## 2022-12-29 ENCOUNTER — Encounter (INDEPENDENT_AMBULATORY_CARE_PROVIDER_SITE_OTHER): Payer: Self-pay | Admitting: Family Medicine

## 2022-12-29 ENCOUNTER — Ambulatory Visit (INDEPENDENT_AMBULATORY_CARE_PROVIDER_SITE_OTHER): Payer: 59 | Admitting: Family Medicine

## 2022-12-29 VITALS — BP 134/74 | HR 65 | Temp 98.0°F | Ht 69.0 in | Wt 280.0 lb

## 2022-12-29 DIAGNOSIS — E669 Obesity, unspecified: Secondary | ICD-10-CM

## 2022-12-29 DIAGNOSIS — E559 Vitamin D deficiency, unspecified: Secondary | ICD-10-CM | POA: Diagnosis not present

## 2022-12-29 DIAGNOSIS — Z7984 Long term (current) use of oral hypoglycemic drugs: Secondary | ICD-10-CM

## 2022-12-29 DIAGNOSIS — E1169 Type 2 diabetes mellitus with other specified complication: Secondary | ICD-10-CM

## 2022-12-29 DIAGNOSIS — Z6841 Body Mass Index (BMI) 40.0 and over, adult: Secondary | ICD-10-CM

## 2022-12-29 MED ORDER — VITAMIN D (ERGOCALCIFEROL) 1.25 MG (50000 UNIT) PO CAPS: 50000.0000 [IU] | ORAL_CAPSULE | ORAL | 1 refills | Status: DC

## 2023-01-04 ENCOUNTER — Encounter (HOSPITAL_BASED_OUTPATIENT_CLINIC_OR_DEPARTMENT_OTHER): Payer: 59 | Admitting: General Surgery

## 2023-01-04 DIAGNOSIS — E11622 Type 2 diabetes mellitus with other skin ulcer: Secondary | ICD-10-CM | POA: Diagnosis not present

## 2023-01-04 NOTE — Progress Notes (Signed)
RHYLEY, ZILCH ([id number]) [id number] Page 1 of 7 Visit Report for 01/04/2023 Chief Complaint Document Details Patient Name: Date of Service: Kenneth Green 01/04/2023 7:30 A M Medical Record Number: [id number] Patient Account Number: [id number] Date of Birth/Sex: Treating RN: 12-Feb-1966 (57 y.o. [name] Primary Care Provider: Leitha Bleak Other Clinician: Referring Provider: Treating Provider/Extender: Benedict Needy Weeks in Treatment: 3 Information Obtained from: Patient Chief Complaint Patient presents to the wound care center with open non-healing surgical wound(s) Electronic Signature(s) Signed: 01/04/2023 7:53:12 AM By: Fredirick Maudlin MD FACS Entered By: Fredirick Maudlin on 01/04/2023 07:53:11 -------------------------------------------------------------------------------- HPI Details Patient Name: Date of Service: Kenneth Hansen EL Green. 01/04/2023 7:30 A M Medical Record Number: [id number] Patient Account Number: [id number] Date of Birth/Sex: Treating RN: Dec 08, 1965 (57 y.o. [name] Primary Care Provider: Leitha Bleak Other Clinician: Referring Provider: Treating Provider/Extender: Benedict Needy Weeks in Treatment: 3 History of Present Illness HPI Description: ADMISSION 12/13/2022 This is a 57 year old man with a past medical history of "prediabetes" (hemoglobin A1c 5.7), morbid obesity, and hypertension. He had chronic bursitis in his right knee that had been drained on multiple occasions and ultimately he underwent a prepatellar bursectomy on October 21, 2022. Unfortunately, the wound dehisced. Initially, it was reapproximated with Steri-Strips, but this failed and he has just been applying a bandage or leaving it open to air. He showed the photographs over the course of the couple of months since his operation and there has been considerable wound closure since that time,  but due to the long duration since his surgery, his orthopedic surgeon referred him to the wound care center for further evaluation and management. On the anterior aspect of his right knee, there is a linear wound, consistent with prior surgical intervention. There is some eschar accumulation around the edges and slough on the wound surface. There is no erythema, induration, malodor, or purulent drainage to suggest infection. 12/20/2022: The wound has contracted considerably. There is still a little bit of depth to it but the surface is extremely clean. 12/27/2022: The wound is smaller again this week. Depth is minimal. Light slough and eschar buildup. 01/04/2023: His wound is basically healed. There is a tiny pinhole opening remaining. Electronic Signature(s) Signed: 01/04/2023 7:56:08 AM By: Fredirick Maudlin MD FACS Entered By: Fredirick Maudlin on 01/04/2023 07:56:07 BUTLER, KEYE ([id number]) [id number] Page 2 of 7 -------------------------------------------------------------------------------- Physical Exam Details Patient Name: Date of Service: Kenneth Crumble Green. 01/04/2023 7:30 A M Medical Record Number: [id number] Patient Account Number: [id number] Date of Birth/Sex: Treating RN: [birth year] (57 y.o. [name] Primary Care Provider: Leitha Bleak Other Clinician: Referring Provider: Treating Provider/Extender: Benedict Needy Weeks in Treatment: 3 Constitutional Slightly hypertensive. . . . no acute distress. Respiratory Normal work of breathing on room air. Notes 01/04/2023: His wound is essentially healed. There is just a tiny pinhole opening. Electronic Signature(s) Signed: 01/04/2023 7:57:05 AM By: Fredirick Maudlin MD FACS Entered By: Fredirick Maudlin on 01/04/2023 07:57:05 -------------------------------------------------------------------------------- Physician Orders Details Patient Name: Date of Service: Kenneth Hansen EL Green. 01/04/2023 7:30 A M Medical Record Number: [id number] Patient Account Number: [id number] Date of Birth/Sex: Treating RN: 01/10/1966 (57 y.o. [name] Primary Care Provider: Leitha Bleak Other Clinician: Referring Provider: Treating Provider/Extender: Benedict Needy Weeks in Treatment: 3 Verbal / Phone Orders: No Diagnosis Coding ICD-10 Coding Code Description [phone] Non-pressure chronic ulcer of other part  of right lower leg with fat layer exposed T81.31XS Disruption of external operation (surgical) wound, not elsewhere classified, sequela E66.01 Morbid (severe) obesity due to excess calories E11.69 Type 2 diabetes mellitus with other specified complication [id number] Essential (primary) hypertension F32.A Depression, unspecified Discharge From [facility] Services Discharge from [place] Bathing/ Shower/ Hygiene May shower and wash wound with soap and water. Non Wound Condition pply the following to affected area as directed: - continue silver alginate and bandaid to right knee for one more week A Electronic Signature(s) Signed: 01/04/2023 8:30:09 AM By: Fredirick Maudlin MD FACS Signed: 01/04/2023 5:50:17 PM By: Baruch Gouty RN, BSN Previous Signature: 01/04/2023 7:57:12 AM Version By: Fredirick Maudlin MD FACS Entered By: Baruch Gouty on 01/04/2023 07:57:23 Kenneth Green, Kenneth Green ([id number]) [id number] Page 3 of 7 -------------------------------------------------------------------------------- Problem List Details Patient Name: Date of Service: Kenneth Crumble Green. 01/04/2023 7:30 A M Medical Record Number: [id number] Patient Account Number: [id number] Date of Birth/Sex: Treating RN: 1966-01-12 (57 y.o. [name] Primary Care Provider: Leitha Bleak Other Clinician: Referring Provider: Treating Provider/Extender: Benedict Needy Weeks in Treatment: 3 Active  Problems ICD-10 Encounter Code Description Active Date MDM Diagnosis [phone] Non-pressure chronic ulcer of other part of right lower leg with fat layer 12/13/2022 No Yes exposed T81.31XS Disruption of external operation (surgical) wound, not elsewhere classified, 12/13/2022 No Yes sequela E66.01 Morbid (severe) obesity due to excess calories 12/13/2022 No Yes E11.69 Type 2 diabetes mellitus with other specified complication 123456 No Yes I10 Essential (primary) hypertension 12/13/2022 No Yes F32.A Depression, unspecified 12/13/2022 No Yes Inactive Problems Resolved Problems Electronic Signature(s) Signed: 01/04/2023 7:52:55 AM By: Fredirick Maudlin MD FACS Entered By: Fredirick Maudlin on 01/04/2023 07:52:55 -------------------------------------------------------------------------------- Progress Note Details Patient Name: Date of Service: Kenneth Hansen EL Green. 01/04/2023 7:30 A M Medical Record Number: [id number] Patient Account Number: [id number] Date of Birth/Sex: Treating RN: 1966/03/03 (57 y.o. [name] Primary Care Provider: Leitha Bleak Other Clinician: Referring Provider: Treating Provider/Extender: Benedict Needy Weeks in Treatment: [address] Kenneth Green, Kenneth Green ([id number]) [id number] Page 4 of 7 Chief Complaint Information obtained from Patient Patient presents to the wound care center with open non-healing surgical wound(s) History of Present Illness (HPI) ADMISSION 12/13/2022 This is a 57 year old man with a past medical history of "prediabetes" (hemoglobin A1c 5.7), morbid obesity, and hypertension. He had chronic bursitis in his right knee that had been drained on multiple occasions and ultimately he underwent a prepatellar bursectomy on October 21, 2022. Unfortunately, the wound dehisced. Initially, it was reapproximated with Steri-Strips, but this failed and he has just been applying a bandage or leaving it  open to air. He showed the photographs over the course of the couple of months since his operation and there has been considerable wound closure since that time, but due to the long duration since his surgery, his orthopedic surgeon referred him to the wound care center for further evaluation and management. On the anterior aspect of his right knee, there is a linear wound, consistent with prior surgical intervention. There is some eschar accumulation around the edges and slough on the wound surface. There is no erythema, induration, malodor, or purulent drainage to suggest infection. 12/20/2022: The wound has contracted considerably. There is still a little bit of depth to it but the surface is extremely clean. 12/27/2022: The wound is smaller again this week. Depth is minimal. Light slough and eschar buildup. 01/04/2023: His wound is basically healed. There is  a tiny pinhole opening remaining. Patient History Information obtained from Patient, Chart. Family History Diabetes - Siblings, Heart Disease - Siblings,Mother,Maternal Grandparents, Hypertension - Mother,Maternal Grandparents, Stroke - Father, No family history of Cancer, Hereditary Spherocytosis, Kidney Disease, Lung Disease, Thyroid Problems, Tuberculosis. Social History Former smoker, Marital Status - Married, Alcohol Use - Rarely, Drug Use - No History, Caffeine Use - Daily - coffee. Medical History Eyes Patient has history of Glaucoma Denies history of Cataracts, Optic Neuritis Respiratory Patient has history of Sleep Apnea - CPAP Cardiovascular Patient has history of Hypertension Endocrine Patient has history of Type II Diabetes Denies history of Type I Diabetes Genitourinary Denies history of End Stage Renal Disease Integumentary (Skin) Denies history of History of Burn Oncologic Denies history of Received Chemotherapy, Received Radiation Psychiatric Denies history of Anorexia/bulimia, Confinement  Anxiety Hospitalization/Surgery History - spinal fusion. - bursa removal right knee 10/21/2022. Medical A Surgical History Notes nd Cardiovascular hyperlipidemia Neurologic spinal stenosis Objective Constitutional Slightly hypertensive. no acute distress. Vitals Time Taken: 7:44 AM, Height: 69 in, Weight: 270 lbs, BMI: 39.9, Temperature: 97.9 F, Pulse: 69 bpm, Respiratory Rate: 18 breaths/min, Blood Pressure: 149/96 mmHg, Capillary Blood Glucose: 101 mg/dl. General Notes: glucose per pt report this morning Respiratory Normal work of breathing on room air. General Notes: 01/04/2023: His wound is essentially healed. There is just a tiny pinhole opening. Integumentary (Hair, Skin) Kenneth Green, Kenneth Green ([id number]) [id number] Page 5 of 7 Wound #1 status is Healed - Epithelialized. Original cause of wound was Surgical Injury. The date acquired was: 10/28/2022. The wound has been in treatment 3 weeks. The wound is located on the Right,Anterior Knee. The wound measures 0cm length x 0cm width x 0cm depth; 0cm^2 area and 0cm^3 volume. There is Fat Layer (Subcutaneous Tissue) exposed. There is no tunneling or undermining noted. There is a none present amount of drainage noted. The wound margin is distinct with the outline attached to the wound base. There is no granulation within the wound bed. There is no necrotic tissue within the wound bed. The periwound skin appearance had no abnormalities noted for texture. The periwound skin appearance had no abnormalities noted for moisture. The periwound skin appearance had no abnormalities noted for color. Periwound temperature was noted as No Abnormality. Assessment Active Problems ICD-10 Non-pressure chronic ulcer of other part of right lower leg with fat layer exposed Disruption of external operation (surgical) wound, not elsewhere classified, sequela Morbid (severe) obesity due to excess calories Type 2 diabetes mellitus  with other specified complication Essential (primary) hypertension Depression, unspecified Plan 01/04/2023: His wound is essentially healed. There is just a tiny pinhole opening remaining. I asked the patient if he wanted to have 1 more follow-up visit next week or just continue to apply the silver alginate and foam border dressing for another week and contact us if he has any further issues. He selected the second option. We will discharge him from the wound care center and he may follow-up as needed. Electronic Signature(s) Signed: 01/04/2023 7:58:09 AM By: Fredirick Maudlin MD FACS Entered By: Fredirick Maudlin on 01/04/2023 07:58:08 -------------------------------------------------------------------------------- HxROS Details Patient Name: Date of Service: Kenneth Hansen EL Green. 01/04/2023 7:30 A M Medical Record Number: [id number] Patient Account Number: [id number] Date of Birth/Sex: Treating RN: 03-25-1966 (57 y.o. [name] Primary Care Provider: Leitha Bleak Other Clinician: Referring Provider: Treating Provider/Extender: Benedict Needy Weeks in Treatment: 3 Information Obtained From Patient Chart Eyes Medical History: Positive for: Glaucoma Negative for: Cataracts; Optic Neuritis  Respiratory Medical History: Positive for: Sleep Apnea - CPAP Cardiovascular Medical History: Positive for: Hypertension Past Medical History Notes: hyperlipidemia Endocrine Medical HistoryMarland Kitchen Kenneth Green, Kenneth Green ([id number]) [id number] Page 6 of 7 Positive for: Type II Diabetes Negative for: Type I Diabetes Time with diabetes: 2 months Treated with: Oral agents Blood sugar tested every day: Yes Tested : daily Genitourinary Medical History: Negative for: End Stage Renal Disease Integumentary (Skin) Medical History: Negative for: History of Burn Neurologic Medical History: Past Medical History Notes: spinal  stenosis Oncologic Medical History: Negative for: Received Chemotherapy; Received Radiation Psychiatric Medical History: Negative for: Anorexia/bulimia; Confinement Anxiety HBO Extended History Items Eyes: Glaucoma Immunizations Pneumococcal Vaccine: Received Pneumococcal Vaccination: No Implantable Devices None Hospitalization / Surgery History Type of Hospitalization/Surgery spinal fusion bursa removal right knee 10/21/2022 Family and Social History Cancer: No; Diabetes: Yes - Siblings; Heart Disease: Yes - Siblings,Mother,Maternal Grandparents; Hereditary Spherocytosis: No; Hypertension: Yes - Mother,Maternal Grandparents; Kidney Disease: No; Lung Disease: No; Stroke: Yes - Father; Thyroid Problems: No; Tuberculosis: No; Former smoker; Marital Status - Married; Alcohol Use: Rarely; Drug Use: No History; Caffeine Use: Daily - coffee; Financial Concerns: No; Food, Clothing or Shelter Needs: No; Support System Lacking: No; Transportation Concerns: No Electronic Signature(s) Signed: 01/04/2023 8:30:09 AM By: Fredirick Maudlin MD FACS Signed: 01/04/2023 5:50:17 PM By: Baruch Gouty RN, BSN Entered By: Fredirick Maudlin on 01/04/2023 07:56:29 -------------------------------------------------------------------------------- SuperBill Details Patient Name: Date of Service: Kenneth Hansen EL Green. 01/04/2023 Medical Record Number: [id number] Patient Account Number: [id number] Date of Birth/Sex: Treating RN: 1966-04-09 (57 y.o. [name] Primary Care Provider: Leitha Bleak Other Clinician: Referring Provider: Treating Provider/Extender: Kenneth Green, Kenneth Green ([id number]) [id number] Page 7 of 7 Weeks in Treatment: 3 Diagnosis Coding ICD-10 Codes Code Description [phone] Non-pressure chronic ulcer of other part of right lower leg with fat layer exposed T81.31XS Disruption of external operation (surgical) wound, not  elsewhere classified, sequela E66.01 Morbid (severe) obesity due to excess calories E11.69 Type 2 diabetes mellitus with other specified complication [id number] Essential (primary) hypertension F32.A Depression, unspecified Facility Procedures : CPT4 Code: [id number] Description: [id number] - WOUND CARE VISIT-LEV 3 EST PT Modifier: Quantity: 1 Physician Procedures : CPT4 Code Description Modifier [id number] - WC PHYS LEVEL 3 - EST PT ICD-10 Diagnosis Description [id number] Non-pressure chronic ulcer of other part of right lower leg with fat layer exposed T81.31XS Disruption of external operation (surgical) wound,  not elsewhere classified, sequela E11.69 Type 2 diabetes mellitus with other specified complication 123456 Morbid (severe) obesity due to excess calories Quantity: 1 Electronic Signature(s) Signed: 01/04/2023 8:30:09 AM By: Fredirick Maudlin MD FACS Signed: 01/04/2023 5:50:17 PM By: Baruch Gouty RN, BSN Previous Signature: 01/04/2023 7:58:24 AM Version By: Fredirick Maudlin MD FACS Entered By: Baruch Gouty on 01/04/2023 08:02:01

## 2023-01-05 NOTE — Progress Notes (Signed)
HURON, DEPAULIS ([id number]) [id number] Page 1 of 7 Visit Report for 01/04/2023 Arrival Information Details Patient Name: Date of Service: Kenneth Green 01/04/2023 7:30 A M Medical Record Number: [id number] Patient Account Number: [id number] Date of Birth/Sex: Treating RN: 26-Sep-1966 (57 y.o. Kenneth Green, [name] Primary Care Markavious Micco: Leitha Bleak Other Clinician: Referring Bryant Saye: Treating Tecla Mailloux/Extender: Benedict Needy Weeks in Treatment: 3 Visit Information History Since Last Visit Added or deleted any medications: No Patient Arrived: Ambulatory Any new allergies or adverse reactions: No Arrival Time: 07:40 Had a fall or experienced change in No Accompanied By: self activities of daily living that may affect Transfer Assistance: None risk of falls: Patient Identification Verified: Yes Signs or symptoms of abuse/neglect since last visito No Secondary Verification Process Completed: Yes Hospitalized since last visit: No Patient Requires Transmission-Based Precautions: No Implantable device outside of the clinic excluding No Patient Has Alerts: No cellular tissue based products placed in the center since last visit: Has Dressing in Place as Prescribed: Yes Pain Present Now: No Electronic Signature(s) Signed: 01/04/2023 5:50:17 PM By: Baruch Gouty RN, BSN Entered By: Baruch Gouty on 01/04/2023 07:41:14 -------------------------------------------------------------------------------- Clinic Level of Care Assessment Details Patient Name: Date of Service: Kenneth Hansen EL Green. 01/04/2023 7:30 A M Medical Record Number: [id number] Patient Account Number: [id number] Date of Birth/Sex: Treating RN: 18-Dec-1965 (57 y.o. Kenneth Green Primary Care Edom Schmuhl: Leitha Bleak Other Clinician: Referring Norely Schlick: Treating Jimmey Hengel/Extender: Benedict Needy Weeks in Treatment: 3 Clinic Level of Care  Assessment Items TOOL 4 Quantity Score []$  - 0 Use when only an EandM is performed on FOLLOW-UP visit ASSESSMENTS - Nursing Assessment / Reassessment X- 1 10 Reassessment of Co-morbidities (includes updates in patient status) X- 1 5 Reassessment of Adherence to Treatment Plan ASSESSMENTS - Wound and Skin A ssessment / Reassessment X - Simple Wound Assessment / Reassessment - one wound 1 5 []$  - 0 Complex Wound Assessment / Reassessment - multiple wounds []$  - 0 Dermatologic / Skin Assessment (not related to wound area) ASSESSMENTS - Focused Assessment []$  - 0 Circumferential Edema Measurements - multi extremities []$  - 0 Nutritional Assessment / Counseling / Intervention KENYAN, MUZIK ([id number]) [id number] Page 2 of 7 []$  - 0 Lower Extremity Assessment (monofilament, tuning fork, pulses) []$  - 0 Peripheral Arterial Disease Assessment (using hand held doppler) ASSESSMENTS - Ostomy and/or Continence Assessment and Care []$  - 0 Incontinence Assessment and Management []$  - 0 Ostomy Care Assessment and Management (repouching, etc.) PROCESS - Coordination of Care X - Simple Patient / Family Education for ongoing care 1 15 []$  - 0 Complex (extensive) Patient / Family Education for ongoing care X- 1 10 Staff obtains Programmer, systems, Records, T Results / Process Orders est []$  - 0 Staff telephones HHA, Nursing Homes / Clarify orders / etc []$  - 0 Routine Transfer to another Facility (non-emergent condition) []$  - 0 Routine Hospital Admission (non-emergent condition) []$  - 0 New Admissions / Biomedical engineer / Ordering NPWT Apligraf, etc. , []$  - 0 Emergency Hospital Admission (emergent condition) X- 1 10 Simple Discharge Coordination []$  - 0 Complex (extensive) Discharge Coordination PROCESS - Special Needs []$  - 0 Pediatric / Minor Patient Management []$  - 0 Isolation Patient Management []$  - 0 Hearing / Language / Visual special needs []$  -  0 Assessment of Community assistance (transportation, D/C planning, etc.) []$  - 0 Additional assistance / Altered mentation []$  - 0 Support Surface(s) Assessment (bed, cushion, seat, etc.) INTERVENTIONS - Wound  Cleansing / Measurement X - Simple Wound Cleansing - one wound 1 5 []$  - 0 Complex Wound Cleansing - multiple wounds X- 1 5 Wound Imaging (photographs - any number of wounds) []$  - 0 Wound Tracing (instead of photographs) X- 1 5 Simple Wound Measurement - one wound []$  - 0 Complex Wound Measurement - multiple wounds INTERVENTIONS - Wound Dressings X - Small Wound Dressing one or multiple wounds 1 10 []$  - 0 Medium Wound Dressing one or multiple wounds []$  - 0 Large Wound Dressing one or multiple wounds []$  - 0 Application of Medications - topical []$  - 0 Application of Medications - injection INTERVENTIONS - Miscellaneous []$  - 0 External ear exam []$  - 0 Specimen Collection (cultures, biopsies, blood, body fluids, etc.) []$  - 0 Specimen(s) / Culture(s) sent or taken to Lab for analysis []$  - 0 Patient Transfer (multiple staff / Civil Service fast streamer / Similar devices) []$  - 0 Simple Staple / Suture removal (25 or less) []$  - 0 Complex Staple / Suture removal (26 or more) []$  - 0 Hypo / Hyperglycemic Management (close monitor of Blood Glucose) Kenneth Green, Kenneth Green ([id number]) [phone].pdf Page 3 of 7 []$  - 0 Ankle / Brachial Index (ABI) - do not check if billed separately X- 1 5 Vital Signs Has the patient been seen at the hospital within the last three years: Yes Total Score: 85 Level Of Care: New/Established - Level 3 Electronic Signature(s) Signed: 01/04/2023 5:50:17 PM By: Baruch Gouty RN, BSN Entered By: Baruch Gouty on 01/04/2023 08:01:48 -------------------------------------------------------------------------------- Encounter Discharge Information Details Patient Name: Date of Service: Kenneth Hansen EL Green. 01/04/2023 7:30 A M Medical Record  Number: [id number] Patient Account Number: [id number] Date of Birth/Sex: Treating RN: 1966-10-03 (57 y.o. Kenneth Green Primary Care Edgar Corrigan: Leitha Bleak Other Clinician: Referring Sotirios Navarro: Treating Amaiah Cristiano/Extender: Benedict Needy Weeks in Treatment: 3 Encounter Discharge Information Items Discharge Condition: Stable Ambulatory Status: Ambulatory Discharge Destination: Home Transportation: Private Auto Accompanied By: self Schedule Follow-up Appointment: Yes Clinical Summary of Care: Patient Declined Electronic Signature(s) Signed: 01/04/2023 5:50:17 PM By: Baruch Gouty RN, BSN Entered By: Baruch Gouty on 01/04/2023 08:02:36 -------------------------------------------------------------------------------- Lower Extremity Assessment Details Patient Name: Date of Service: Kenneth Hansen EL Green. 01/04/2023 7:30 A M Medical Record Number: [id number] Patient Account Number: [id number] Date of Birth/Sex: Treating RN: February 13, 1966 (57 y.o. Kenneth Green Primary Care Taylia Berber: Leitha Bleak Other Clinician: Referring Yariana Hoaglund: Treating Jesly Hartmann/Extender: Benedict Needy Weeks in Treatment: 3 Electronic Signature(s) Signed: 01/04/2023 5:50:17 PM By: Baruch Gouty RN, BSN Entered By: Baruch Gouty on 01/04/2023 07:45:52 -------------------------------------------------------------------------------- Multi Wound Chart Details Patient Name: Date of Service: Kenneth Hansen EL Green. 01/04/2023 7:30 A M Medical Record Number: [id number] Patient Account Number: [id number] Kenneth Green, Kenneth Green ([id number]) [phone].pdf Page 4 of 7 Date of Birth/Sex: Treating RN: 04-18-1966 (57 y.o. Kenneth Green Primary Care Mata Rowen: Other Clinician: Leitha Bleak Referring Paralee Pendergrass: Treating Julina Altmann/Extender: Benedict Needy Weeks in Treatment: 3 Vital Signs Height(in): 69 Capillary Blood  Glucose(mg/dl): 101 Weight(lbs): 270 Pulse(bpm): 35 Body Mass Index(BMI): 39.9 Blood Pressure(mmHg): 149/96 Temperature(F): 97.9 Respiratory Rate(breaths/min): 18 [1:Photos:] [N/A:N/A] Right, Anterior Knee N/A N/A Wound Location: Surgical Injury N/A N/A Wounding Event: Dehisced Wound N/A N/A Primary Etiology: Glaucoma, Sleep Apnea, N/A N/A Comorbid History: Hypertension, Type II Diabetes 10/28/2022 N/A N/A Date Acquired: 3 N/A N/A Weeks of Treatment: Open N/A N/A Wound Status: No N/A N/A Wound Recurrence: 0.5x0.1x0.1 N/A N/A Measurements L x W x D (  cm) 0.039 N/A N/A A (cm) : rea 0.004 N/A N/A Volume (cm) : 94.80% N/A N/A % Reduction in Area: 97.40% N/A N/A % Reduction in Volume: Full Thickness Without Exposed N/A N/A Classification: Support Structures None Present N/A N/A Exudate Amount: Distinct, outline attached N/A N/A Wound Margin: None Present (0%) N/A N/A Granulation Amount: None Present (0%) N/A N/A Necrotic Amount: Fat Layer (Subcutaneous Tissue): Yes N/A N/A Exposed Structures: Fascia: No Tendon: No Muscle: No Joint: No Bone: No Large (67-100%) N/A N/A Epithelialization: Scarring: No N/A N/A Periwound Skin Texture: No Abnormalities Noted N/A N/A Periwound Skin Moisture: Rubor: No N/A N/A Periwound Skin Color: No Abnormality N/A N/A Temperature: Treatment Notes Electronic Signature(s) Signed: 01/04/2023 7:53:04 AM By: Fredirick Maudlin MD FACS Signed: 01/04/2023 5:50:17 PM By: Baruch Gouty RN, BSN Entered By: Fredirick Maudlin on 01/04/2023 07:53:04 -------------------------------------------------------------------------------- Multi-Disciplinary Care Plan Details Patient Name: Date of Service: Kenneth Hansen EL Green. 01/04/2023 7:30 A M Medical Record Number: [id number] Patient Account Number: [id number] Date of Birth/Sex: Treating RN: August 09, 1966 (57 y.o. Kenneth Green Primary Care Roosevelt Eimers: Leitha Bleak Other  Clinician: Referring Zenaya Ulatowski: Treating Joci Dress/Extender: Kenneth Green, Kenneth Green ([id number]) [id number] Page 5 of 7 Weeks in Treatment: 3 Multidisciplinary Care Plan reviewed with physician Active Inactive Electronic Signature(s) Signed: 01/04/2023 5:50:17 PM By: Baruch Gouty RN, BSN Entered By: Baruch Gouty on 01/04/2023 08:01:13 -------------------------------------------------------------------------------- Pain Assessment Details Patient Name: Date of Service: Kenneth Hansen EL Green. 01/04/2023 7:30 A M Medical Record Number: [id number] Patient Account Number: [id number] Date of Birth/Sex: Treating RN: [birth year] (57 y.o. Kenneth Green Primary Care Liala Codispoti: Leitha Bleak Other Clinician: Referring Moncerrath Berhe: Treating Nazario Russom/Extender: Benedict Needy Weeks in Treatment: 3 Active Problems Location of Pain Severity and Description of Pain Patient Has Paino No Site Locations Rate the pain. Current Pain Level: 0 Pain Management and Medication Current Pain Management: Electronic Signature(s) Signed: 01/04/2023 5:50:17 PM By: Baruch Gouty RN, BSN Entered By: Baruch Gouty on 01/04/2023 07:45:38 -------------------------------------------------------------------------------- Patient/Caregiver Education Details Patient Name: Date of Service: Kenneth Green 2/13/2024andnbsp7:30 A M Medical Record Number: [id number] Patient Account Number: [id number] Date of Birth/Gender: Treating RN: Jun 20, 1966 (57 y.o. Kenneth Green Primary Care Physician: Leitha Bleak Other Clinician: EVELYN, Kenneth Green ([id number]) [id number] Page 6 of 7 Referring Physician: Treating Physician/Extender: Benedict Needy Weeks in Treatment: 3 Education Assessment Education Provided To: Patient Education Topics Provided Wound/Skin Impairment: Methods:  Explain/Verbal Responses: Reinforcements needed, State content correctly Electronic Signature(s) Signed: 01/04/2023 5:50:17 PM By: Baruch Gouty RN, BSN Entered By: Baruch Gouty on 01/04/2023 07:52:47 -------------------------------------------------------------------------------- Wound Assessment Details Patient Name: Date of Service: Kenneth Hansen EL Green. 01/04/2023 7:30 A M Medical Record Number: [id number] Patient Account Number: [id number] Date of Birth/Sex: Treating RN: 03/04/1966 (57 y.o. Kenneth Green Primary Care Nikolas Casher: Leitha Bleak Other Clinician: Referring Quanna Wittke: Treating Luisenrique Conran/Extender: Benedict Needy Weeks in Treatment: 3 Wound Status Wound Number: 1 Primary Etiology: Dehisced Wound Wound Location: Right, Anterior Knee Wound Status: Healed - Epithelialized Wounding Event: Surgical Injury Comorbid History: Glaucoma, Sleep Apnea, Hypertension, Type II Diabetes Date Acquired: 10/28/2022 Weeks Of Treatment: 3 Clustered Wound: No Photos Wound Measurements Length: (cm) Width: (cm) Depth: (cm) Area: (cm) Volume: (cm) 0 % Reduction in Area: 100% 0 % Reduction in Volume: 100% 0 Epithelialization: Large (67-100%) 0 Tunneling: No 0 Undermining: No Wound Description Classification: Full Thickness Without Exposed Support Structures Wound Margin: Distinct, outline attached Exudate Amount:  None Present Foul Odor After Cleansing: No Slough/Fibrino No Wound Bed Granulation Amount: None Present (0%) Exposed Kenneth Green, Kenneth Green ([id number]) [id number] Page 7 of 7 Necrotic Amount: None Present (0%) Fascia Exposed: No Fat Layer (Subcutaneous Tissue) Exposed: Yes Tendon Exposed: No Muscle Exposed: No Joint Exposed: No Bone Exposed: No Periwound Skin Texture Texture Color No Abnormalities Noted: Yes No Abnormalities Noted: Yes Moisture Temperature / Pain No Abnormalities Noted: Yes Temperature:  No Abnormality Electronic Signature(s) Signed: 01/04/2023 5:50:17 PM By: Baruch Gouty RN, BSN Entered By: Baruch Gouty on 01/04/2023 07:55:25 -------------------------------------------------------------------------------- [place] Details Patient Name: Date of Service: Kenneth Hansen EL Green. 01/04/2023 7:30 A M Medical Record Number: [id number] Patient Account Number: [id number] Date of Birth/Sex: Treating RN: 08/30/1966 (57 y.o. Kenneth Green Primary Care Preslynn Bier: Leitha Bleak Other Clinician: Referring Adeleigh Barletta: Treating Menna Abeln/Extender: Benedict Needy Weeks in Treatment: 3 Vital Signs Time Taken: 07:44 Temperature (F): 97.9 Height (in): 69 Pulse (bpm): 69 Weight (lbs): 270 Respiratory Rate (breaths/min): 18 Body Mass Index (BMI): 39.9 Blood Pressure (mmHg): 149/96 Capillary Blood Glucose (mg/dl): 101 Reference Range: 80 - 120 mg / dl Notes glucose per pt report this morning Electronic Signature(s) Signed: 01/04/2023 5:50:17 PM By: Baruch Gouty RN, BSN Entered By: Baruch Gouty on 01/04/2023 07:45:31

## 2023-01-06 LAB — VITAMIN D 25 HYDROXY (VIT D DEFICIENCY, FRACTURES): Vit D, 25-Hydroxy: 31.4 ng/mL (ref 30.0–100.0)

## 2023-01-07 ENCOUNTER — Other Ambulatory Visit: Payer: Self-pay | Admitting: Orthopaedic Surgery

## 2023-01-07 DIAGNOSIS — M4326 Fusion of spine, lumbar region: Secondary | ICD-10-CM

## 2023-01-10 ENCOUNTER — Ambulatory Visit: Payer: 59 | Admitting: Behavioral Health

## 2023-01-10 ENCOUNTER — Encounter: Payer: Self-pay | Admitting: Behavioral Health

## 2023-01-10 DIAGNOSIS — F411 Generalized anxiety disorder: Secondary | ICD-10-CM | POA: Diagnosis not present

## 2023-01-10 DIAGNOSIS — F331 Major depressive disorder, recurrent, moderate: Secondary | ICD-10-CM | POA: Diagnosis not present

## 2023-01-10 DIAGNOSIS — F4323 Adjustment disorder with mixed anxiety and depressed mood: Secondary | ICD-10-CM

## 2023-01-10 DIAGNOSIS — F3289 Other specified depressive episodes: Secondary | ICD-10-CM

## 2023-01-10 MED ORDER — BUPROPION HCL ER (SR) 200 MG PO TB12: ORAL_TABLET | ORAL | 1 refills | Status: DC

## 2023-01-10 MED ORDER — SERTRALINE HCL 50 MG PO TABS: 50.0000 mg | ORAL_TABLET | Freq: Every day | ORAL | 1 refills | Status: DC

## 2023-01-10 NOTE — Progress Notes (Signed)
[facility]  Patient ID: [id number],  MRN: [id number]  PCP: Cari Caraway, MD  Date of Evaluation: 01/10/2023 Time spent:30 minutes  Chief Complaint:  Chief Complaint   Anxiety; Depression; Follow-up; Medication Refill; Patient Education; Stress     HISTORY/CURRENT STATUS: HPI 57 year old male presents to this office for follow up and medication management. He is calm but flat affect. Still having a great deal of stress from wife's health issues. He tried to take some time for himself. He continues in psychotherapy. He does not want to changes or adjust his medications at this time.  He reports his anxiety today at 3/10  and depression at 3/10. Says he is sleeping 7-8 hours nightly. Denies mania, no psychosis. No SI/HI.    No Past medication failures   Individual Medical History/ Review of Systems: Changes? :No   Allergies: Iohexol  Current Medications:  Current Outpatient Medications:    benazepril-hydrochlorthiazide (LOTENSIN HCT) 10-12.5 MG tablet, Take 1 tablet by mouth 2 (two) times daily., Disp: 60 tablet, Rfl: 1   buPROPion (WELLBUTRIN SR) 200 MG 12 hr tablet, Take one tablet twice daily (200 mg total)., Disp: 180 tablet, Rfl: 1   fexofenadine (ALLEGRA) 180 MG tablet, Take 180 mg by mouth daily., Disp: , Rfl:    latanoprost (XALATAN) 0.005 % ophthalmic solution, SMARTSIG:In Eye(s), Disp: , Rfl:    pregabalin (LYRICA) 150 MG capsule, Take 150 mg by mouth 2 (two) times daily., Disp: , Rfl:    sertraline (ZOLOFT) 50 MG tablet, Take 1 tablet (50 mg total) by mouth daily., Disp: 90 tablet, Rfl: 1   Travoprost, BAK Free, (TRAVATAN) 0.004 % SOLN ophthalmic solution, Place 1 drop into both eyes at bedtime., Disp: , Rfl:    Vitamin D, Ergocalciferol, (DRISDOL) 1.25 MG (50000 UNIT) CAPS capsule, Take 1 capsule by mouth every 7 days., Disp: 4 capsule, Rfl: 1 Medication Side Effects: none  Family Medical/ Social History: Changes? No  MENTAL HEALTH EXAM:  There  were no vitals taken for this visit.There is no height or weight on file to calculate BMI.  General Appearance: Casual and Neat  Eye Contact:  Good  Speech:  Clear and Coherent  Volume:  Normal  Mood:  Anxious  Affect:  Appropriate  Thought Process:  Coherent  Orientation:  Full (Time, Place, and Person)  Thought Content: Logical   Suicidal Thoughts:  No  Homicidal Thoughts:  No  Memory:  WNL  Judgement:  Good  Insight:  Good  Psychomotor Activity:  Normal  Concentration:  Concentration: Good  Recall:  Good  Fund of Knowledge: Good  Language: Good  Assets:  Desire for Improvement  ADL's:  Intact  Cognition: WNL  Prognosis:  Good    DIAGNOSES:    ICD-10-CM   1. Major depressive disorder, recurrent episode, moderate (HCC)  F33.1 sertraline (ZOLOFT) 50 MG tablet    2. Generalized anxiety disorder  F41.1 sertraline (ZOLOFT) 50 MG tablet    3. Adjustment disorder with mixed anxiety and depressed mood  F43.23 sertraline (ZOLOFT) 50 MG tablet    4. Other depression with emotional eating  F32.89 buPROPion (WELLBUTRIN SR) 200 MG 12 hr tablet      Receiving Psychotherapy: No    RECOMMENDATIONS:    Greater than 50% of 30  min face to face time with patient was spent on counseling and coordination of care.  Pt says that when he increased his Zoloft to 100 mg he started to get mentally foggy. So he  reduced back to 50 mg and symptoms resolved. He does not want to make any further changes this visit but will continue to monitor anxiety and depression closely.   We discussed his stressors at home along with caregiver role strain. 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 We discussed the following plan: To continue Zoloft to 50 mg daily Continue Wellbutrin 200 mg SR two times daily To report side effects or worsening symptoms promptly Provided emergency contact information To  follow up in 4 months to reassess. Reviewed PDMP           Elwanda Brooklyn, NP

## 2023-01-11 ENCOUNTER — Ambulatory Visit
Admission: RE | Admit: 2023-01-11 | Discharge: 2023-01-11 | Disposition: A | Discharge status: Home / Self Care | Payer: 59 | Source: Ambulatory Visit | Attending: Orthopaedic Surgery | Admitting: Orthopaedic Surgery

## 2023-01-11 DIAGNOSIS — M4326 Fusion of spine, lumbar region: Secondary | ICD-10-CM

## 2023-01-12 NOTE — Progress Notes (Signed)
Chief Complaint:   OBESITY Kenneth Green is here to discuss his progress with his obesity treatment plan along with follow-up of his obesity related diagnoses. Kenneth Green is on practicing portion control and making smarter food choices, such as increasing vegetables and decreasing simple carbohydrates and states he is following his eating plan approximately 70% of the time. Kenneth Green states he is walking for 45 minutes 5 times per week.  Today's visit was #: 48 Starting weight: 308 lbs Starting date: 04/10/2019 Today's weight: 280 lbs Today's date: 12/29/2022 Total lbs lost to date: 28 Total lbs lost since last in-office visit: 0  Interim History: [place] gained weight over the holidays. He notes increased stress eating but he has gotten off track with his eating plan. He works nights and frequently snacks to try to stay awake.   Subjective:   1. Vitamin D deficiency Kenneth Green is on Vitamin D, and he is due for labs soon but will be having them done at his PCP.   2. Type 2 diabetes mellitus with other specified complication, unspecified whether long term insulin use (HCC) Kenneth Green felt the metformin makes him feel bad and he notices increased gas so stopped. He wants to work on the diet to continue to control his glucose.   Assessment/Plan:   1. Vitamin D deficiency We will refill prescription Vitamin D for 2 months, and bring in results to the next visit.   - Vitamin D, Ergocalciferol, (DRISDOL) 1.25 MG (50000 UNIT) CAPS capsule; Take 1 capsule by mouth every 7 days.  Dispense: 4 capsule; Refill: 1  2. Type 2 diabetes mellitus with other specified complication, unspecified whether long term insulin use ([place]) Kenneth Green agreed to discontinue metformin and will continue to work on his diet and exercise.   3. BMI 40.0-44.9, adult ([place])  4. Obesity, Beginning BMI 45.48 Kenneth Green is currently in the action stage of change. As such, his goal is to continue with weight loss efforts. He has agreed to  the Category 4 Plan.   Exercise goals: As is.   Behavioral modification strategies: meal planning and cooking strategies.  Kenneth Green has agreed to follow-up with our clinic in 4 weeks. He was informed of the importance of frequent follow-up visits to maximize his success with intensive lifestyle modifications for his multiple health conditions.   Objective:   Blood pressure 134/74, pulse 65, temperature 98 F (36.7 C), height 5' 9"$  (1.753 m), weight 280 lb (127 kg), SpO2 97 %. Body mass index is 41.35 kg/m.  General: Cooperative, alert, well developed, in no acute distress. HEENT: Conjunctivae and lids unremarkable. Cardiovascular: Regular rhythm.  Lungs: Normal work of breathing. Neurologic: No focal deficits.   Lab Results  Component Value Date   CREATININE 1.11 09/13/2022   BUN 16 09/13/2022   NA 139 09/13/2022   K 4.4 09/13/2022   CL 104 09/13/2022   CO2 19 (L) 09/13/2022   Lab Results  Component Value Date   ALT 30 09/13/2022   AST 21 09/13/2022   ALKPHOS 65 09/13/2022   BILITOT 0.4 09/13/2022   Lab Results  Component Value Date   HGBA1C 5.4 09/13/2022   HGBA1C 5.4 04/26/2022   HGBA1C 5.4 01/19/2022   HGBA1C 5.4 11/11/2021   HGBA1C 5.9 (H) 01/15/2021   Lab Results  Component Value Date   INSULIN 10.2 09/13/2022   INSULIN 23.5 04/26/2022   INSULIN 20.5 11/11/2021   INSULIN 12.8 01/15/2021   INSULIN 18.1 07/23/2020   Lab Results  Component Value Date  TSH 1.340 04/10/2019   Lab Results  Component Value Date   CHOL 177 09/13/2022   HDL 52 09/13/2022   LDLCALC 105 (H) 09/13/2022   TRIG 111 09/13/2022   Lab Results  Component Value Date   VD25OH 31.4 01/05/2023   VD25OH 41.0 09/13/2022   VD25OH 46.4 04/26/2022   Lab Results  Component Value Date   WBC 4.6 04/26/2022   HGB 13.8 04/26/2022   HCT 42.7 04/26/2022   MCV 84 04/26/2022   PLT 223 04/26/2022   No results found for: "IRON", "TIBC", "FERRITIN"  Attestation Statements:   Reviewed  by clinician on day of visit: allergies, medications, problem list, medical history, surgical history, family history, social history, and previous encounter notes.   I, Trixie Dredge, am acting as transcriptionist for Dennard Nip, MD.  I have reviewed the above documentation for accuracy and completeness, and I agree with the above. -  Dennard Nip, MD

## 2023-01-26 ENCOUNTER — Ambulatory Visit (INDEPENDENT_AMBULATORY_CARE_PROVIDER_SITE_OTHER): Payer: 59 | Admitting: Family Medicine

## 2023-01-27 ENCOUNTER — Other Ambulatory Visit: Payer: Self-pay | Admitting: Behavioral Health

## 2023-01-27 ENCOUNTER — Ambulatory Visit (INDEPENDENT_AMBULATORY_CARE_PROVIDER_SITE_OTHER): Payer: 59 | Admitting: Psychology

## 2023-01-27 DIAGNOSIS — F331 Major depressive disorder, recurrent, moderate: Secondary | ICD-10-CM

## 2023-01-27 DIAGNOSIS — F4323 Adjustment disorder with mixed anxiety and depressed mood: Secondary | ICD-10-CM

## 2023-01-27 DIAGNOSIS — F411 Generalized anxiety disorder: Secondary | ICD-10-CM

## 2023-01-27 NOTE — Progress Notes (Signed)
[place] Counselor/Therapist Progress Note  [name], MRN: [id number],    Date: 01/27/2023  Time Spent: 45 mins  Treatment Type: Individual Therapy  Reported Symptoms: Pt presented via webex video for follow up session, granting consent for the session.  Pt stated he was in his home and no one else was present.  I shared with pt that I am in my office and no one else is present.  Mental Status Exam: Appearance:  Casual     Behavior: Appropriate  Motor: Normal  Speech/Language:  Clear and Coherent  Affect: Appropriate  Mood: normal  Thought process: normal  Thought content:   WNL  Sensory/Perceptual disturbances:   WNL  Orientation: oriented to person, place, and time/date  Attention: Good  Concentration: Good  Memory: WNL  Fund of knowledge:  Good  Insight:   Good  Judgment:  Good  Impulse Control: Good   Risk Assessment: Danger to Self:  No Self-injurious Behavior: No Danger to Others: No Duty to Warn:no Physical Aggression / Violence:No  Access to Firearms a concern: No  Gang Involvement:No   Subjective: Pt shares that "It has been kind of crazy since our last session.  [name] shoulder is better.  Her eye infection turned into a hole in her cornea and that turned into a cornea transplant.  They say this is a result of her rheumatoid arthritis.  My leg has been having shooting pains again and I am going to have to have injections again; my other knee has finally healed up.  All this stuff has been really tough; it seems like we can't catch a break."  Pt shares he is "pretty beat down; I had the flu last week and it was the worst case I have ever had.  My daughter had to take off work to take [place] to the cornea transplant because I could not get out of bed."  Pt shares he is finally feeling better.  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  Pt shares "there are some weird happenings at work that  are a little unsettling.  Two people, who were integral to the operation are no longer there; they walked a 61 yr veteran of the company to the gate yesterday."  Pt shares he has gotten positive feedback on his performance in his new position and he appreciates that.  Asked pt if he is getting enough sleep and he shares that he got lots of sleep and tries to get sleep whenever he can otherwise.  Pt did have his [facility] and that appt went well; because of everything that is going on with them pt has not been able to cook for them and has been stress eating (obviously).  Pt shares that [place] are both doing well; his granddaughter is great and still come to spend a day a week with them.  Pt is going to the re-enactment at [facility] in 2 wks; it is one of the biggest events in their year.  Pt shares that he has only been drinking on the weekends and has been able to manage the amounts that he drinks very well; he describes his drinking as "well controlled."  Encouraged pt to continue with his self care activities and we will meet in 3 wks for a follow up session.  Interventions: Cognitive Behavioral Therapy  Diagnosis:Adjustment disorder with mixed anxiety and depressed mood  Plan: Treatment Plan  Strengths/Abilities:  Intelligent, Intuitive, Willing to participate in therapy Treatment Preferences:  Outpatient Individual Therapy Statement of Needs:  Patient is to use CBT, mindfulness and coping skills to help manage and/or decrease symptoms associated with their diagnosis. Symptoms:  Depressed/Irritable mood, worry, social withdrawal Problems Addressed:  Depressive thoughts, Sadness, Sleep issues, etc. Long Term Goals:  Pt to reduce overall level, frequency, and intensity of the feelings of depression as evidenced by decreased irritability, negative self talk, and helpless feelings from 6 to 7 days/week to 0 to 1 days/week, per client report, for at least 3 consecutive months.  Progress:  20% Short Term Goals:  Pt to verbally express understanding of the relationship between feelings of depression and their impact on thinking patterns and behaviors.  Pt to verbalize an understanding of the role that distorted thinking plays in creating fears, excessive worry, and ruminations.  Progress: 20% Target Date:  11/18/2023 Frequency:  Bi-weekly Modality:  Cognitive Behavioral Therapy Interventions by Therapist:  Therapist will use CBT, Mindfulness exercises, Coping skills and Referrals, as needed by client. Client has verbally approved this treatment plan.  Kenneth Green, [facility]

## 2023-02-16 ENCOUNTER — Ambulatory Visit: Payer: 59 | Admitting: Psychology

## 2023-02-16 DIAGNOSIS — F4323 Adjustment disorder with mixed anxiety and depressed mood: Secondary | ICD-10-CM

## 2023-02-16 NOTE — Progress Notes (Signed)
[name] Counselor/Therapist Progress Note  Patient ID: HAO PARRA, MRN: [id number],    Date: 02/16/2023  Time Spent: 45 mins  Treatment Type: Individual Therapy  Reported Symptoms: Pt presented via webex video for follow up session, granting consent for the session.  Pt stated he was in his home and no one else was present.  I shared with pt that I am in my office and no one else is present.  Mental Status Exam: Appearance:  Casual     Behavior: Appropriate  Motor: Normal  Speech/Language:  Clear and Coherent  Affect: Appropriate  Mood: normal  Thought process: normal  Thought content:   WNL  Sensory/Perceptual disturbances:   WNL  Orientation: oriented to person, place, and time/date  Attention: Good  Concentration: Good  Memory: WNL  Fund of knowledge:  Good  Insight:   Good  Judgment:  Good  Impulse Control: Good   Risk Assessment: Danger to Self:  No Self-injurious Behavior: No Danger to Others: No Duty to Warn:no Physical Aggression / Violence:No  Access to Firearms a concern: No  Gang Involvement:No   Subjective: Pt shares that "It has still been kind of crazy since our last session.  [name] has fallen three times since our last session.  One was at out daughter's house when we went for dinner; [name] fell on the driveway and got a big gash in her forehead and we had to go to the ED.  They checked her out and she was OK.  She fell again the next weekend at home when I was not here.  My brother was over at my mom's so he came over to help her get up.  The third time she fell I was here but I could not get up by myself.  I called a neighbor to help me get her up.  I had to come home last night because she did not answer her phone.  She was fine but it was stressful."  Pt shares he enjoyed doing the [employer].  The crowd was big for the event as well.  Pt shares "work is going pretty well.  They have let a couple of people go  that we thought they would never fire.  That puts a little uneasiness on you so I am keeping my head down and trying to get my work down."  [name]'s eye is continuing to heal; saw the eye doctor yesterday and they are happy with how she is healing.  His daughter, son-in-law, and grand daughter are doing well.  He got to keep his daughter's dog last weekend because they were out of town; pt loves their dog.  Pt has gotten his back injections but they did not help; same issue as before, just in a different place.  He goes back to see the doctor again next week.  Encouraged pt to be careful when he is lifting [name] to not do things that could be harmful to his own back.  Pt is going to [place] this weekend to do a [employer] event and he is looking forward to that time.  His daughter will be home this weekend to help [facility] as needed.  Pt continues to stress eat, for obvious reasons.  Pt shares, "I am back to where I was at the beginning and sometimes I just don't care."  Talked with pt about making good choices with food where he is able to and to give himself grace in the  other times.  Pt applied for and got intermittent FMLA to care for [name] as a means to protect his job.  Pt shares that [place] are both doing well.  Pt shares that his drinking has maintained about the same; no large swings in spite of everything else going on.  Encouraged pt to continue with his self care activities and we will meet in 3 wks for a follow up session.  Interventions: Cognitive Behavioral Therapy  Diagnosis:Adjustment disorder with mixed anxiety and depressed mood  Plan: Treatment Plan Strengths/Abilities:  Intelligent, Intuitive, Willing to participate in therapy Treatment Preferences:  Outpatient Individual Therapy Statement of Needs:  Patient is to use CBT, mindfulness and coping skills to help manage and/or decrease symptoms associated with their diagnosis. Symptoms:  Depressed/Irritable mood, worry, social  withdrawal Problems Addressed:  Depressive thoughts, Sadness, Sleep issues, etc. Long Term Goals:  Pt to reduce overall level, frequency, and intensity of the feelings of depression as evidenced by decreased irritability, negative self talk, and helpless feelings from 6 to 7 days/week to 0 to 1 days/week, per client report, for at least 3 consecutive months.  Progress: 20% Short Term Goals:  Pt to verbally express understanding of the relationship between feelings of depression and their impact on thinking patterns and behaviors.  Pt to verbalize an understanding of the role that distorted thinking plays in creating fears, excessive worry, and ruminations.  Progress: 20% Target Date:  11/18/2023 Frequency:  Bi-weekly Modality:  Cognitive Behavioral Therapy Interventions by Therapist:  Therapist will use CBT, Mindfulness exercises, Coping skills and Referrals, as needed by client. Client has verbally approved this treatment plan.  Ivan Anchors, [facility]

## 2023-02-22 ENCOUNTER — Other Ambulatory Visit (HOSPITAL_COMMUNITY): Payer: Self-pay

## 2023-02-22 ENCOUNTER — Encounter (INDEPENDENT_AMBULATORY_CARE_PROVIDER_SITE_OTHER): Payer: Self-pay | Admitting: Family Medicine

## 2023-02-22 ENCOUNTER — Ambulatory Visit (INDEPENDENT_AMBULATORY_CARE_PROVIDER_SITE_OTHER): Payer: 59 | Admitting: Family Medicine

## 2023-02-22 VITALS — BP 157/89 | HR 72 | Temp 98.4°F | Ht 69.0 in | Wt 293.0 lb

## 2023-02-22 DIAGNOSIS — E559 Vitamin D deficiency, unspecified: Secondary | ICD-10-CM

## 2023-02-22 DIAGNOSIS — R7303 Prediabetes: Secondary | ICD-10-CM | POA: Diagnosis not present

## 2023-02-22 DIAGNOSIS — E782 Mixed hyperlipidemia: Secondary | ICD-10-CM | POA: Diagnosis not present

## 2023-02-22 DIAGNOSIS — Z6841 Body Mass Index (BMI) 40.0 and over, adult: Secondary | ICD-10-CM

## 2023-02-22 DIAGNOSIS — E669 Obesity, unspecified: Secondary | ICD-10-CM | POA: Diagnosis not present

## 2023-02-22 MED ORDER — VITAMIN D (ERGOCALCIFEROL) 1.25 MG (50000 UNIT) PO CAPS
50000.0000 [IU] | ORAL_CAPSULE | ORAL | 1 refills | Status: DC
  Filled 2023-02-22 – 2023-03-05 (×2): qty 4, 28d supply, fill #0

## 2023-02-22 NOTE — Progress Notes (Unsigned)
Chief Complaint:   OBESITY Kenneth Green is here to discuss his progress with his obesity treatment plan along with follow-up of his obesity related diagnoses. Kenneth Green is on the Category 4 Plan and states he is following his eating plan approximately 0% of the time. Kenneth Green states he is doing 0 minutes 0 times per week.  Today's visit was #: 58 Starting weight: 308 lbs Starting date: 04/10/2019 Today's weight: 293 lbs Today's date: 02/22/2023 Total lbs lost to date: 15 Total lbs lost since last in-office visit: 0  Interim History: Kenneth Green has been off his eating plan and has increased comfort eating recently. His wife is having additional health issues and he is worried about her. Both of their weight has increased in the last 2 months.   Subjective:   1. Vitamin D deficiency Kenneth Green is on Vitamin D, and he is due for labs.   2. Mixed hyperlipidemia Kenneth Green is struggling with his diet. He is not on a statin, and he is due for labs.   3. Pre-diabetes Kenneth Green has pre-diabetes and he has been working to improve with her his diet and weight loss, but he has fallen off track recently.   Assessment/Plan:   1. Vitamin D deficiency We will check labs today, and we will refill prescription Vitamin D for 2 months.   - Vitamin D, Ergocalciferol, (DRISDOL) 1.25 MG (50000 UNIT) CAPS capsule; Take 1 capsule by mouth every 7 days.  Dispense: 4 capsule; Refill: 1 - VITAMIN D 25 Hydroxy (Vit-D Deficiency, Fractures)  2. Mixed hyperlipidemia We will check labs today, and we will follow-up at Kenneth Green's next visit.   - Lipid Panel With LDL/HDL Ratio - CBC with Differential/Platelet - TSH  3. Pre-diabetes We will check labs today. Kenneth Green was encouraged to get back to his healthier eating plan to hep prevent the onset of diabetes mellitus.   - Hemoglobin A1c - Insulin, random - Vitamin B12 - CMP14+EGFR  4. BMI 40.0-44.9, adult  5. Obesity, Beginning BMI 45.48 Kenneth Green is currently in the  action stage of change. As such, his goal is to continue with weight loss efforts. He has agreed to the Category 4 Plan.   Kenneth Green was encouraged to go back to his structured plan and stop bringing unhealthy foods into the house.   Behavioral modification strategies: keeping healthy foods in the home and emotional eating strategies.  Kenneth Green has agreed to follow-up with our clinic in 4 weeks. He was informed of the importance of frequent follow-up visits to maximize his success with intensive lifestyle modifications for his multiple health conditions.   Kenneth Green was informed we would discuss his lab results at his next visit unless there is a critical issue that needs to be addressed sooner. Kenneth Green agreed to keep his next visit at the agreed upon time to discuss these results.  Objective:   Blood pressure (!) 157/89, pulse 72, temperature 98.4 F (36.9 C), height 5\' 9"  (1.753 m), weight 293 lb (132.9 kg), SpO2 98 %. Body mass index is 43.27 kg/m.  Lab Results  Component Value Date   CREATININE 1.11 09/13/2022   BUN 16 09/13/2022   NA 139 09/13/2022   K 4.4 09/13/2022   CL 104 09/13/2022   CO2 19 (L) 09/13/2022   Lab Results  Component Value Date   ALT 30 09/13/2022   AST 21 09/13/2022   ALKPHOS 65 09/13/2022   BILITOT 0.4 09/13/2022   Lab Results  Component Value Date   HGBA1C 5.4 09/13/2022  HGBA1C 5.4 04/26/2022   HGBA1C 5.4 01/19/2022   HGBA1C 5.4 11/11/2021   HGBA1C 5.9 (H) 01/15/2021   Lab Results  Component Value Date   INSULIN 10.2 09/13/2022   INSULIN 23.5 04/26/2022   INSULIN 20.5 11/11/2021   INSULIN 12.8 01/15/2021   INSULIN 18.1 07/23/2020   Lab Results  Component Value Date   TSH 1.340 04/10/2019   Lab Results  Component Value Date   CHOL 177 09/13/2022   HDL 52 09/13/2022   LDLCALC 105 (H) 09/13/2022   TRIG 111 09/13/2022   Lab Results  Component Value Date   VD25OH 31.4 01/05/2023   VD25OH 41.0 09/13/2022   VD25OH 46.4 04/26/2022   Lab  Results  Component Value Date   WBC 4.6 04/26/2022   HGB 13.8 04/26/2022   HCT 42.7 04/26/2022   MCV 84 04/26/2022   PLT 223 04/26/2022   No results found for: "IRON", "TIBC", "FERRITIN"  Attestation Statements:   Reviewed by clinician on day of visit: allergies, medications, problem list, medical history, surgical history, family history, social history, and previous encounter notes.  Time spent on visit including pre-visit chart review and post-visit care and charting was 40 minutes.   I, Trixie Dredge, am acting as transcriptionist for Dennard Nip, MD.  I have reviewed the above documentation for accuracy and completeness, and I agree with the above. -  Dennard Nip, MD

## 2023-02-23 LAB — CBC WITH DIFFERENTIAL/PLATELET
Basophils Absolute: 0 10*3/uL (ref 0.0–0.2)
Basos: 1 %
EOS (ABSOLUTE): 0.3 10*3/uL (ref 0.0–0.4)
Eos: 5 %
Hematocrit: 43.3 % (ref 37.5–51.0)
Hemoglobin: 14.2 g/dL (ref 13.0–17.7)
Immature Grans (Abs): 0 10*3/uL (ref 0.0–0.1)
Immature Granulocytes: 0 %
Lymphocytes Absolute: 1 10*3/uL (ref 0.7–3.1)
Lymphs: 20 %
MCH: 28.2 pg (ref 26.6–33.0)
MCHC: 32.8 g/dL (ref 31.5–35.7)
MCV: 86 fL (ref 79–97)
Monocytes Absolute: 0.4 10*3/uL (ref 0.1–0.9)
Monocytes: 8 %
Neutrophils Absolute: 3.4 10*3/uL (ref 1.4–7.0)
Neutrophils: 66 %
Platelets: 205 10*3/uL (ref 150–450)
RBC: 5.03 x10E6/uL (ref 4.14–5.80)
RDW: 13.3 % (ref 11.6–15.4)
WBC: 5.1 10*3/uL (ref 3.4–10.8)

## 2023-02-23 LAB — CMP14+EGFR
ALT: 33 IU/L (ref 0–44)
AST: 25 IU/L (ref 0–40)
Albumin/Globulin Ratio: 1.8 (ref 1.2–2.2)
Albumin: 4.4 g/dL (ref 3.8–4.9)
Alkaline Phosphatase: 62 IU/L (ref 44–121)
BUN/Creatinine Ratio: 12 (ref 9–20)
BUN: 13 mg/dL (ref 6–24)
Bilirubin Total: 0.3 mg/dL (ref 0.0–1.2)
CO2: 24 mmol/L (ref 20–29)
Calcium: 9.8 mg/dL (ref 8.7–10.2)
Chloride: 102 mmol/L (ref 96–106)
Creatinine, Ser: 1.08 mg/dL (ref 0.76–1.27)
Globulin, Total: 2.4 g/dL (ref 1.5–4.5)
Glucose: 105 mg/dL — ABNORMAL HIGH (ref 70–99)
Potassium: 4.3 mmol/L (ref 3.5–5.2)
Sodium: 141 mmol/L (ref 134–144)
Total Protein: 6.8 g/dL (ref 6.0–8.5)
eGFR: 80 mL/min/{1.73_m2} (ref >59)

## 2023-02-23 LAB — HEMOGLOBIN A1C
Est. average glucose Bld gHb Est-mCnc: 126 mg/dL
Hgb A1c MFr Bld: 6 % — ABNORMAL HIGH (ref 4.8–5.6)

## 2023-02-23 LAB — LIPID PANEL WITH LDL/HDL RATIO
Cholesterol, Total: 200 mg/dL — ABNORMAL HIGH (ref 100–199)
HDL: 53 mg/dL (ref >39)
LDL Chol Calc (NIH): 131 mg/dL — ABNORMAL HIGH (ref 0–99)
LDL/HDL Ratio: 2.5 ratio (ref 0.0–3.6)
Triglycerides: 91 mg/dL (ref 0–149)
VLDL Cholesterol Cal: 16 mg/dL (ref 5–40)

## 2023-02-23 LAB — TSH: TSH: 1.02 u[IU]/mL (ref 0.450–4.500)

## 2023-02-23 LAB — VITAMIN B12: Vitamin B-12: 408 pg/mL (ref 232–1245)

## 2023-02-23 LAB — INSULIN, RANDOM: INSULIN: 39.6 u[IU]/mL — ABNORMAL HIGH (ref 2.6–24.9)

## 2023-02-23 LAB — VITAMIN D 25 HYDROXY (VIT D DEFICIENCY, FRACTURES): Vit D, 25-Hydroxy: 33.6 ng/mL (ref 30.0–100.0)

## 2023-02-25 ENCOUNTER — Other Ambulatory Visit: Payer: Self-pay | Admitting: Behavioral Health

## 2023-02-25 DIAGNOSIS — F3289 Other specified depressive episodes: Secondary | ICD-10-CM

## 2023-03-02 ENCOUNTER — Other Ambulatory Visit (HOSPITAL_COMMUNITY): Payer: Self-pay

## 2023-03-05 ENCOUNTER — Other Ambulatory Visit (HOSPITAL_COMMUNITY): Payer: Self-pay

## 2023-03-07 ENCOUNTER — Other Ambulatory Visit (INDEPENDENT_AMBULATORY_CARE_PROVIDER_SITE_OTHER): Payer: Self-pay | Admitting: Family Medicine

## 2023-03-07 DIAGNOSIS — I10 Essential (primary) hypertension: Secondary | ICD-10-CM

## 2023-03-09 ENCOUNTER — Other Ambulatory Visit (INDEPENDENT_AMBULATORY_CARE_PROVIDER_SITE_OTHER): Payer: Self-pay | Admitting: Family Medicine

## 2023-03-09 ENCOUNTER — Ambulatory Visit: Payer: 59 | Admitting: Psychology

## 2023-03-09 DIAGNOSIS — E559 Vitamin D deficiency, unspecified: Secondary | ICD-10-CM

## 2023-03-09 DIAGNOSIS — F4323 Adjustment disorder with mixed anxiety and depressed mood: Secondary | ICD-10-CM | POA: Diagnosis not present

## 2023-03-09 NOTE — Progress Notes (Signed)
[facility] Counselor/Therapist Progress Note  [name], MRN: [id number],    Date: 03/09/2023  Time Spent: 45 mins  Treatment Type: Individual Therapy  Reported Symptoms: Pt presented via webex video for follow up session, granting consent for the session.  Pt stated he was in his home and no one else was present.  I shared with pt that I am in my office and no one else is present.  Mental Status Exam: Appearance:  Casual     Behavior: Appropriate  Motor: Normal  Speech/Language:  Clear and Coherent  Affect: Appropriate  Mood: normal  Thought process: normal  Thought content:   WNL  Sensory/Perceptual disturbances:   WNL  Orientation: oriented to person, place, and time/date  Attention: Good  Concentration: Good  Memory: WNL  Fund of knowledge:  Good  Insight:   Good  Judgment:  Good  Impulse Control: Good   Risk Assessment: Danger to Self:  No Self-injurious Behavior: No Danger to Others: No Duty to Warn:no Physical Aggression / Violence:No  Access to Firearms a concern: No  Gang Involvement:No   Subjective: Pt shares that "It has still been kind of crazy since our last session.  [name] has been having back spasms and it turns out she has a fractured vertebrae from her fall at her daughter's home.  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  Her eye is still getting better but she still can't see out of that eye, but the doctors are hopeful."  When asked how pt is doing he responds with, "I don't even know."  Talked with pt about options for him for social interactions; he shares that his re-enactment friend is starting chemotherapy again soon and pt is hopeful that it will be effective for his friend.  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  He has  been involved in this activity for 30 years; "It is the one thing I have done for longer than anything else in my life."  Pt enjoyed his trip to [place] to shoot his black powder gun with friends.  He shares that [name] is about the same in terms of her mood; "she is tired of everything too and who can blame her."  He has been getting his garden space ready and likely will plant his garden this coming weekend.  His daughter is ready for school to be out for the year; she is thinking about moving to [place] in the Fall.  Pt shares that his other daughter is pregnant again, this time with a son.  Pt is excited about that.  Pt shares that work is going OK; "the new leadership is interesting.  It feels a little less stable."  Pt shares that there have been some additional personnel decisions but those changes are settling down.  Pt goes back to the back specialist on 5/1 for another round of injections.  He is going to [facility] in 2 wks for a demonstration of [employer]; he is looking forward to that.  Pt shares that his drinking has maintained about the same; no large swings in spite of everything else going on.  Encouraged pt to continue with his self care activities and we will meet in 2 wks for a follow up session.  Interventions: Cognitive Behavioral  Therapy  Diagnosis:Adjustment disorder with mixed anxiety and depressed mood  Plan: Treatment Plan Strengths/Abilities:  Intelligent, Intuitive, Willing to participate in therapy Treatment Preferences:  Outpatient Individual Therapy Statement of Needs:  Patient is to use CBT, mindfulness and coping skills to help manage and/or decrease symptoms associated with their diagnosis. Symptoms:  Depressed/Irritable mood, worry, social withdrawal Problems Addressed:  Depressive thoughts, Sadness, Sleep issues, etc. Long Term Goals:  Pt to reduce overall level, frequency, and intensity of the feelings of depression as evidenced by decreased irritability, negative self talk, and  helpless feelings from 6 to 7 days/week to 0 to 1 days/week, per client report, for at least 3 consecutive months.  Progress: 20% Short Term Goals:  Pt to verbally express understanding of the relationship between feelings of depression and their impact on thinking patterns and behaviors.  Pt to verbalize an understanding of the role that distorted thinking plays in creating fears, excessive worry, and ruminations.  Progress: 20% Target Date:  11/18/2023 Frequency:  Bi-weekly Modality:  Cognitive Behavioral Therapy Interventions by Therapist:  Therapist will use CBT, Mindfulness exercises, Coping skills and Referrals, as needed by client. Client has verbally approved this treatment plan.  Karie Kirks, [facility]

## 2023-03-22 ENCOUNTER — Encounter (INDEPENDENT_AMBULATORY_CARE_PROVIDER_SITE_OTHER): Payer: Self-pay | Admitting: Family Medicine

## 2023-03-22 ENCOUNTER — Ambulatory Visit (INDEPENDENT_AMBULATORY_CARE_PROVIDER_SITE_OTHER): Payer: 59 | Admitting: Family Medicine

## 2023-03-22 VITALS — BP 139/85 | HR 74 | Temp 98.4°F | Ht 69.0 in | Wt 295.0 lb

## 2023-03-22 DIAGNOSIS — E669 Obesity, unspecified: Secondary | ICD-10-CM

## 2023-03-22 DIAGNOSIS — Z6841 Body Mass Index (BMI) 40.0 and over, adult: Secondary | ICD-10-CM

## 2023-03-22 DIAGNOSIS — R739 Hyperglycemia, unspecified: Secondary | ICD-10-CM | POA: Diagnosis not present

## 2023-03-22 DIAGNOSIS — F3289 Other specified depressive episodes: Secondary | ICD-10-CM | POA: Diagnosis not present

## 2023-03-22 NOTE — Progress Notes (Unsigned)
Office: [phone]  /  Fax: [phone]    Date: 04/05/2023   Appointment Start Time: 3:06pm Duration: 51 minutes Provider: Lawerance Cruel, Psy.D. Type of Session: Intake for Individual Therapy  Location of Patient: Parked in car outside of home (private location) Location of Provider: Provider's home (private office) Type of Contact: Telepsychological Visit via MyChart Video Visit  Informed Consent: Prior to proceeding with today's appointment, two pieces of identifying information were obtained. In addition, Kenneth Green's physical location at the time of this appointment was obtained as well a phone number he could be reached at in the event of technical difficulties. Kenneth Green and this provider participated in today's telepsychological service.   The provider's role was explained to Kenneth Green. The provider reviewed and discussed issues of confidentiality, privacy, and limits therein (e.g., reporting obligations). In addition to verbal informed consent, written informed consent for psychological services was obtained prior to the initial appointment. Since the clinic is not a 24/7 crisis center, mental health emergency resources were shared and this  provider explained MyChart, e-mail, voicemail, and/or other messaging systems should be utilized only for non-emergency reasons. This provider also explained that information obtained during appointments will be placed in Kenneth Green's medical record and relevant information will be shared with other providers at [facility] for coordination of care. Kenneth Green agreed information may be shared with other [facility] providers as needed for coordination of care and by signing the service agreement document, he provided written consent for coordination of care. Prior to initiating telepsychological services, Kenneth Green completed an informed consent document, which included the development of a safety plan (i.e., an emergency contact  and emergency resources) in the event of an emergency/crisis. Kenneth Green verbally acknowledged understanding he is ultimately responsible for understanding his insurance benefits for telepsychological and in-person services. This provider also reviewed confidentiality, as it relates to telepsychological services. Kenneth Green  acknowledged understanding that appointments cannot be recorded without both party consent and he is aware he is responsible for securing confidentiality on his end of the session. Kenneth Green verbally consented to proceed.  Of note, today's appointment was switched to a regular telephone call at 3:46pm with Kenneth Green's verbal consent due to technical issues.   Chief Complaint/HPI: Kenneth Green was referred by Dr. Quillian Green due to  emotional eating behavior . Per the note for the visit with Dr. Quillian Green on 03/22/2023, "Kenneth Green has a history of depression and anxiety. He is struggling with increased levels of emotional eating behavior. He shows signs of nighttime eating disorder and possibly some binge eating episodes." Kenneth Green last met with this provider on 06/21/2019.  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 Due to ongoing stressors, he stated he has not been able to focus on his eating habits. Kenneth Green reported he will eat whatever is convenient, adding he is "eating all the wrong things." He described the current frequency of emotional eating behaviors as "at least once a day." In addition, Kenneth Green denied a history of binge eating behaviors. Kenneth Green denied a history of significantly restricting food intake, purging and engagement in other compensatory strategies for weight loss.   Mental Status Examination:  Appearance: neat Behavior: appropriate to circumstances Mood: depressed Affect: mood congruent Speech: WNL Eye Contact: intermittent  Psychomotor Activity:  appropriate Gait: unable to assess  Thought Process: linear, logical, and goal directed and denies suicidal, homicidal, and self-harm ideation, plan  and intent  Thought Content/Perception: no hallucinations, delusions, bizarre thinking or behavior endorsed or observed Orientation: AAOx4 Memory/Concentration: intact Insight/Judgment: fair  Family & Psychosocial History: Kenneth Green reported he is married and he has two adult children. 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 Additionally, Kenneth Green shared his highest level of education obtained is an associate's degree in Metallurgist. Currently, Kenneth Green's social support system consists of his primary therapist, friend from war re-enactment unit, mother, and daughters. Moreover, Kenneth Green stated he resides with his wife.   Medical History:  Past Medical History:  Diagnosis Date   Anxiety    Back pain    Binge eating disorder    Dyspnea    Glaucoma    Hypertension    Lower extremity edema    Major depressive disorder, recurrent, unspecified (HCC)    Mixed hyperlipidemia    Obesity    OSA (obstructive sleep apnea)    Prediabetes    Slow transit constipation    Past Surgical History:  Procedure Laterality Date   SPINAL FUSION     Current Outpatient Medications on File Prior to Visit  Medication Sig Dispense Refill   benazepril-hydrochlorthiazide (LOTENSIN HCT) 10-12.5 MG tablet Take 1 tablet by mouth 2 (two) times daily. 60 tablet 1   buPROPion (WELLBUTRIN SR) 200 MG 12 hr tablet TAKE 1 TABLET BY MOUTH TWICE  DAILY 180 tablet 0   fexofenadine (ALLEGRA) 180 MG tablet Take 180 mg by mouth daily.     latanoprost (XALATAN) 0.005 % ophthalmic solution SMARTSIG:In Eye(s)     pregabalin (LYRICA) 150 MG capsule Take 150 mg by mouth 2 (two) times daily.     sertraline (ZOLOFT) 50 MG tablet TAKE 1 TABLET BY MOUTH DAILY 90 tablet 0   Vitamin D, Ergocalciferol, (DRISDOL) 1.25  MG (50000 UNIT) CAPS capsule Take 1 capsule by mouth every 7 days. 4 capsule 1   No current facility-administered medications on file prior to visit.  Kenneth Green stated he is medication compliant.   Mental Health History: Kenneth Green reported he is currently attending therapeutic services with Kenneth Green [facility]), noting they meet every two weeks to address symptoms of depression and anxiety. He reported he has a psychiatric provider Kenneth Laine, NP) that prescribes Zoloft and Wellbutrin. Keghan reported there is no history of hospitalizations for psychiatric concerns. Kenneth Green denied a family history of mental health/substance abuse related concerns. Kenneth Green reported there is no history of trauma including psychological, physical , and sexual abuse, as well as neglect.   Kenneth Green described his typical mood lately as "okay, not great." He added it is "below average on a consistent basis." He discussed experiencing social withdrawal, adding, "I hate crowds." He also noted issues with short-term memory starting two years ago and feels it is related to mood. Kenneth Green agreed to discuss further with PCP if it worsens. Notably, he discussed endorsed items on the administered measures are secondary to current concerns related to his wife's well-being. Kenneth Green endorsed current alcohol use (1-2 standard drinks on Saturday and Sunday). He endorsed tobacco use (e.g., a pipe 1-2xs on the weekend). He denied illicit/recreational substance use. Furthermore, Kenneth Green indicated he is not experiencing the following: hallucinations and delusions, paranoia, symptoms of mania , crying spells, panic attacks, and obsessions and compulsions. He also denied history of and current suicidal ideation, plan, and intent; history of and current homicidal ideation, plan, and intent; and history of and current engagement in self-harm.  Legal  History: Kenneth Green reported there is no history of legal involvement.   Structured  Assessments Results: The Patient Health Questionnaire-9 (PHQ-9) is a self-report measure that assesses symptoms and severity of depression over the course of the last two weeks. Kenneth Green obtained a score of 13 suggesting moderate depression. Kenneth Green finds the endorsed symptoms to be very difficult. [0= Not at all; 1= Several days; 2= More than half the days; 3= Nearly every day] Little interest or pleasure in doing things 2  Feeling down, depressed, or hopeless 2  Trouble falling or staying asleep, or sleeping too much 2  Feeling tired or having little energy 3  Poor appetite or overeating 3  Feeling bad about yourself --- or that you are a failure or have let yourself or your family down 0  Trouble concentrating on things, such as reading the newspaper or watching television 1  Moving or speaking so slowly that other people could have noticed? Or the opposite --- being so fidgety or restless that you have been moving around a lot more than usual 0  Thoughts that you would be better off dead or hurting yourself in some way 0  PHQ-9 Score 13    The Generalized Anxiety Disorder-7 (GAD-7) is a brief self-report measure that assesses symptoms of anxiety over the course of the last two weeks. Jamyson obtained a score of 11 suggesting moderate anxiety. Trashawn finds the endorsed symptoms to be somewhat difficult. [0= Not at all; 1= Several days; 2= Over half the days; 3= Nearly every day] Feeling nervous, anxious, on edge 3  Not being able to stop or control worrying 0  Worrying too much about different things 3  Trouble relaxing 3  Being so restless that it's hard to sit still 2  Becoming easily annoyed or irritable 0  Feeling afraid as if something awful might happen 0  GAD-7 Score 11   Interventions:  Conducted a chart review Verbally administered PHQ-9 and GAD-7 for symptom monitoring Verbally administered Food & Mood questionnaire to assess various behaviors related to emotional  eating Provided emphatic reflections and validation Collaborated with patient on a treatment goal  Psychoeducation provided regarding physical versus emotional hunger Engaged pt in problem solving Psychoeducation provided regarding the importance of eating regularly  Diagnostic Impressions & Provisional DSM-5 Diagnosis(es): Rodderick has a history of engagement in emotional eating behaviors and previously met with this provider in 2020. Currently, he stated he engages in emotional eating behaviors daily. He denied engagement in any other disordered eating behaviors. Additionally, Sollie reported, "Things have been going downhill" as it relates to his wife's health, which has resulted in various depression/anxiety symptoms. 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  Plan: Messiah appears able and willing to participate as evidenced by collaboration on a treatment goal, engagement in reciprocal conversation, and asking questions as needed for clarification. The next appointment is scheduled for 05/09/2023 at 2:30pm, which will be via MyChart Video Visit. The following treatment goal was established: increase coping skills. This provider will regularly review the treatment plan and medical chart to keep informed of status changes. Kasem expressed understanding and agreement with the initial treatment plan of care.  Kira will implement the following: take foods/snacks to work for the week; double recipes; and make vegetables in bulk or use frozen vegetables. Additionally, Britain will continue with his primary therapist.

## 2023-03-23 NOTE — Progress Notes (Unsigned)
Chief Complaint:   OBESITY Kenneth Green is here to discuss his progress with his obesity treatment plan along with follow-up of his obesity related diagnoses. Kenneth Green is on the Category 4 Plan and states he is following his eating plan approximately 40% of the time. Kenneth Green states he is walking for 60 minutes 5 times per week.  Today's visit was #: 55 Starting weight: 308 lbs Starting date: 04/10/2019 Today's weight: 295 lbs Today's date: 03/22/2023 Total lbs lost to date: 13 Total lbs lost since last in-office visit: 0  Interim History: Kenneth Green has been under a lot of stress, and this has significantly affected his comfort eating. He recognizes this is worsening his health and he is working hard to get back on track.   Subjective:   1. Hyperglycemia Kenneth Green has had an episode of feeling lightheaded and trembling. He also notes feelings very mentally foggy. This resolved on it's own after an hour or so. He also suspects he accidentally took 2 of his Zoloft.   2. Emotional Eating Behavior Kenneth Green has a history of depression and anxiety. He is struggling with increased levels of emotional eating behavior. He shows signs of nighttime eating disorder and possibly some binge eating episodes.   Assessment/Plan:   1. Hyperglycemia Kenneth Green will continue Zoloft 100 mg and Wellbutrin SR BID, and we will refer to Dr. Dewaine Conger for additional insight and support.   2. Emotional Eating Behavior Kenneth Green to follow his Category 3 plan closely and watch for signs of hypoglycemia. We will follow-up in 2 weeks to reassess.   3. BMI 40.0-44.9, adult (HCC)  4. Obesity, Beginning BMI 45.48 Kenneth Green is currently in the action stage of change. As such, his goal is to continue with weight loss efforts. He has agreed to the Category 3 Plan.   We will plan to see him and his wife sooner in 2 weeks to help him stay on track with a higher level of support.   Exercise goals: All adults should avoid inactivity.  Some physical activity is better than none, and adults who participate in any amount of physical activity gain some health benefits.  Behavioral modification strategies: decreasing eating out, no skipping meals, keeping healthy foods in the home, and emotional eating strategies.  Kenneth Green has agreed to follow-up with our clinic in 2 weeks. He was informed of the importance of frequent follow-up visits to maximize his success with intensive lifestyle modifications for his multiple health conditions.   Objective:   Blood pressure 139/85, pulse 74, temperature 98.4 F (36.9 C), height 5\' 9"  (1.753 m), weight 295 lb (133.8 kg), SpO2 96 %. Body mass index is 43.56 kg/m.  Lab Results  Component Value Date   CREATININE 1.08 02/22/2023   BUN 13 02/22/2023   NA 141 02/22/2023   K 4.3 02/22/2023   CL 102 02/22/2023   CO2 24 02/22/2023   Lab Results  Component Value Date   ALT 33 02/22/2023   AST 25 02/22/2023   ALKPHOS 62 02/22/2023   BILITOT 0.3 02/22/2023   Lab Results  Component Value Date   HGBA1C 6.0 (H) 02/22/2023   HGBA1C 5.4 09/13/2022   HGBA1C 5.4 04/26/2022   HGBA1C 5.4 01/19/2022   HGBA1C 5.4 11/11/2021   Lab Results  Component Value Date   INSULIN 39.6 (H) 02/22/2023   INSULIN 10.2 09/13/2022   INSULIN 23.5 04/26/2022   INSULIN 20.5 11/11/2021   INSULIN 12.8 01/15/2021   Lab Results  Component Value Date   TSH  1.020 02/22/2023   Lab Results  Component Value Date   CHOL 200 (H) 02/22/2023   HDL 53 02/22/2023   LDLCALC 131 (H) 02/22/2023   TRIG 91 02/22/2023   Lab Results  Component Value Date   VD25OH 33.6 02/22/2023   VD25OH 31.4 01/05/2023   VD25OH 41.0 09/13/2022   Lab Results  Component Value Date   WBC 5.1 02/22/2023   HGB 14.2 02/22/2023   HCT 43.3 02/22/2023   MCV 86 02/22/2023   PLT 205 02/22/2023   No results found for: "IRON", "TIBC", "FERRITIN"  Attestation Statements:   Reviewed by clinician on day of visit: allergies, medications,  problem list, medical history, surgical history, family history, social history, and previous encounter notes.  Time spent on visit including pre-visit chart review and post-visit care and charting was 45 minutes.   I, Burt Knack, am acting as transcriptionist for Quillian Quince, MD.  I have reviewed the above documentation for accuracy and completeness, and I agree with the above. -  Quillian Quince, MD

## 2023-03-30 ENCOUNTER — Ambulatory Visit (INDEPENDENT_AMBULATORY_CARE_PROVIDER_SITE_OTHER): Payer: 59 | Admitting: Psychology

## 2023-03-30 DIAGNOSIS — F4323 Adjustment disorder with mixed anxiety and depressed mood: Secondary | ICD-10-CM | POA: Diagnosis not present

## 2023-03-30 NOTE — Progress Notes (Signed)
[facility] Counselor/Therapist Progress Note  [name], MRN: [id number],    Date: 03/30/2023  Time Spent: 45 mins  Treatment Type: Individual Therapy  Reported Symptoms: Pt presented via Caregility video for follow up session, granting consent for the session.  Pt stated he was in his home and no one else was present.  I shared with pt that I am in my office and no one else is present.  Mental Status Exam: Appearance:  Casual     Behavior: Appropriate  Motor: Normal  Speech/Language:  Clear and Coherent  Affect: Appropriate  Mood: normal  Thought process: normal  Thought content:   WNL  Sensory/Perceptual disturbances:   WNL  Orientation: oriented to person, place, and time/date  Attention: Good  Concentration: Good  Memory: WNL  Fund of knowledge:  Good  Insight:   Good  Judgment:  Good  Impulse Control: Good   Risk Assessment: Danger to Self:  No Self-injurious Behavior: No Danger to Others: No Duty to Warn:no Physical Aggression / Violence:No  Access to Firearms a concern: No  Gang Involvement:No   Subjective: Pt shares that "It has still been kind of crazy since our last session.  [name] may not have heart failure.  The cardiologist prescribed Lasix to draw fluid off of her to see if that helps.  We have an appt this afternoon with a GI doctor for [name]."  Pt shares that her eye is continuing to heal but she has no vision in the eye; the doctor is hopeful that she will regain some vision in that eye.  She has not had any more falls since our last session.  Pt shares that he "is exhausted."  He tries to get more rest on the weekends.  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  Pt shares that he has not yet connected with people socially; "I don't like people."  I challenged pt on this point  and he admits that he is more socially anxious and is often worried about embarrassing himself in some way. 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  Pt shares that he has had another injection in his back and it is sometimes helpful and not helpful at other times.  He will likely need surgery but he is trying to put that off as long as possible.  Pt shares he kept his daughter's dog last weekend and loved spending time with him.  He also enjoys keeping his grand daughter as well.  She will be 2 yo in Sept and his grandson will be born in August.  He has planted some of his garden and hopes to get more of it planted this weekend.   "I am glad to be able to be getting back outside and seeing all of my flowers coming up everywhere."  His daughter has accepted another teaching position with [employer] as a 7th grade math teacher at [employer].  Pt shares that his drinking has maintained about the same; no  large swings in spite of everything else going on.  Encouraged pt to continue with his self care activities and we will meet in 3 wks for a follow up session.  Interventions: Cognitive Behavioral Therapy  Diagnosis:Adjustment disorder with mixed anxiety and depressed mood  Plan: Treatment Plan Strengths/Abilities:  Intelligent, Intuitive, Willing to participate in therapy Treatment Preferences:  Outpatient Individual Therapy Statement of Needs:  Patient is to use CBT, mindfulness and coping skills to help manage and/or decrease symptoms associated with their diagnosis. Symptoms:  Depressed/Irritable mood, worry, social withdrawal Problems Addressed:  Depressive thoughts, Sadness, Sleep issues,  etc. Long Term Goals:  Pt to reduce overall level, frequency, and intensity of the feelings of depression as evidenced by decreased irritability, negative self talk, and helpless feelings from 6 to 7 days/week to 0 to 1 days/week, per client report, for at least 3 consecutive months.  Progress: 20% Short Term Goals:  Pt to verbally express understanding of the relationship between feelings of depression and their impact on thinking patterns and behaviors.  Pt to verbalize an understanding of the role that distorted thinking plays in creating fears, excessive worry, and ruminations.  Progress: 20% Target Date:  11/18/2023 Frequency:  Bi-weekly Modality:  Cognitive Behavioral Therapy Interventions by Therapist:  Therapist will use CBT, Mindfulness exercises, Coping skills and Referrals, as needed by client. Client has verbally approved this treatment plan.  Karie Kirks, [facility]

## 2023-04-04 ENCOUNTER — Ambulatory Visit (INDEPENDENT_AMBULATORY_CARE_PROVIDER_SITE_OTHER): Payer: 59 | Admitting: Family Medicine

## 2023-04-05 ENCOUNTER — Telehealth (INDEPENDENT_AMBULATORY_CARE_PROVIDER_SITE_OTHER): Payer: 59 | Admitting: Psychology

## 2023-04-05 DIAGNOSIS — F4323 Adjustment disorder with mixed anxiety and depressed mood: Secondary | ICD-10-CM | POA: Diagnosis not present

## 2023-04-05 DIAGNOSIS — F5089 Other specified eating disorder: Secondary | ICD-10-CM

## 2023-04-19 ENCOUNTER — Ambulatory Visit (INDEPENDENT_AMBULATORY_CARE_PROVIDER_SITE_OTHER): Payer: 59 | Admitting: Family Medicine

## 2023-04-19 ENCOUNTER — Encounter (INDEPENDENT_AMBULATORY_CARE_PROVIDER_SITE_OTHER): Payer: Self-pay | Admitting: Family Medicine

## 2023-04-19 VITALS — BP 141/89 | HR 69 | Temp 98.2°F | Ht 69.0 in | Wt 297.0 lb

## 2023-04-19 DIAGNOSIS — E559 Vitamin D deficiency, unspecified: Secondary | ICD-10-CM | POA: Diagnosis not present

## 2023-04-19 DIAGNOSIS — F3289 Other specified depressive episodes: Secondary | ICD-10-CM | POA: Diagnosis not present

## 2023-04-19 DIAGNOSIS — E669 Obesity, unspecified: Secondary | ICD-10-CM

## 2023-04-19 DIAGNOSIS — Z6841 Body Mass Index (BMI) 40.0 and over, adult: Secondary | ICD-10-CM

## 2023-04-19 MED ORDER — VITAMIN D (ERGOCALCIFEROL) 1.25 MG (50000 UNIT) PO CAPS: 50000.0000 [IU] | ORAL_CAPSULE | ORAL | 1 refills | Status: DC

## 2023-04-20 ENCOUNTER — Ambulatory Visit: Payer: 59 | Admitting: Psychology

## 2023-04-20 DIAGNOSIS — F4323 Adjustment disorder with mixed anxiety and depressed mood: Secondary | ICD-10-CM | POA: Diagnosis not present

## 2023-04-20 NOTE — Progress Notes (Signed)
[facility] Counselor/Therapist Progress Note  [name], MRN: [id number],    Date: 04/20/2023  Time Spent: 45 mins  Treatment Type: Individual Therapy  Reported Symptoms: Pt presented via Caregility video for follow up session, granting consent for the session.  Pt stated he was in his car and no one else was present.  I shared with pt that I am in my office and no one else is present.  Mental Status Exam: Appearance:  Casual     Behavior: Appropriate  Motor: Normal  Speech/Language:  Clear and Coherent  Affect: Appropriate  Mood: normal  Thought process: normal  Thought content:   WNL  Sensory/Perceptual disturbances:   WNL  Orientation: oriented to person, place, and time/date  Attention: Good  Concentration: Good  Memory: WNL  Fund of knowledge:  Good  Insight:   Good  Judgment:  Good  Impulse Control: Good   Risk Assessment: Danger to Self:  No Self-injurious Behavior: No Danger to Others: No Duty to Warn:no Physical Aggression / Violence:No  Access to Firearms a concern: No  Gang Involvement:No   Subjective: Pt shares that "[name] is at the [facility] for an office visit this afternoon; her back pain is due primarily to arthritis so surgery is not a good option."  Pt shares that there is nothing that he can do to make [name]'s situation better and that is hard for pt to admit to.  Talked to pt about the need to take care of himself and we need to switch his focus to that.  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  He thinks he may be interested learning more about chair caning in order to demonstrate the skill to  the public at re-enactment events in the future.  Pt continues to work with his garden "but there is some frustration there because my daughter has not been available to help me very much like she used to do."  His daughter is looking forward to teaching in the [facility] [employer] this next year.  Pt continues to keep his grand daughter weekly and he enjoys that.  Pt shares that he needs to get back with his eating plan and he needs to lose weight.  Talked with pt about not beating himself up because that practice does not benefit him at all.  Asked pt to be mindful of his eating and try to make good choices when he can.  Encouraged pt to continue with his self care activities and we will meet in 3 wks for a follow up session.  Interventions: Cognitive Behavioral Therapy  Diagnosis:Adjustment disorder with mixed anxiety and depressed mood  Plan: Treatment Plan Strengths/Abilities:  Intelligent, Intuitive, Willing to participate in therapy Treatment Preferences:  Outpatient Individual Therapy Statement of Needs:  Patient is to use CBT, mindfulness and coping skills to help manage and/or decrease symptoms associated with their diagnosis. Symptoms:  Depressed/Irritable mood, worry, social withdrawal Problems Addressed:  Depressive thoughts, Sadness, Sleep issues, etc. Long Term Goals:  Pt to reduce overall level, frequency, and intensity of the feelings of depression as evidenced by  decreased irritability, negative self talk, and helpless feelings from 6 to 7 days/week to 0 to 1 days/week, per client report, for at least 3 consecutive months.  Progress: 20% Short Term Goals:  Pt to verbally express understanding of the relationship between feelings of depression and their impact on thinking patterns and behaviors.  Pt to verbalize an understanding of the role that distorted thinking plays in creating fears, excessive worry, and ruminations.  Progress: 20% Target Date:  11/18/2023 Frequency:   Bi-weekly Modality:  Cognitive Behavioral Therapy Interventions by Therapist:  Therapist will use CBT, Mindfulness exercises, Coping skills and Referrals, as needed by client. Client has verbally approved this treatment plan.  Karie Kirks, [facility]

## 2023-04-20 NOTE — Progress Notes (Unsigned)
Chief Complaint:   OBESITY Kenneth Green is here to discuss his progress with his obesity treatment plan along with follow-up of his obesity related diagnoses. Kenneth Green is on the Category 3 Plan and states he is following his eating plan approximately 60% of the time. Kenneth Green states he is walking and doing yard work for 45 minutes 5 times per week.  Today's visit was #: 56 Starting weight: 308 lbs Starting date: 04/10/2019 Today's weight: 297 lbs Today's date: 04/19/2023 Total lbs lost to date: 11 Total lbs lost since last in-office visit: 0  Interim History: Patient is doing more gardening and yard work in addition to his walking, and his 2 pound weight gain is actually due to increased muscle mass.  He is working on following his category 3 plan and he is making strategies to help decrease emotional eating behavior.  Subjective:   1. Vitamin D deficiency Patient's last vitamin D level was worsening, and he is on vitamin D regularly and is spending more time outdoors.  2. Emotional Eating Behavior Patient is seeing his counselor and Dr. Dewaine Green.  He recognizes that he is eating for non-hunger reasons and this worsens when he has more downtime.  Assessment/Plan:   1. Vitamin D deficiency Patient will continue prescription vitamin D, and we will refill for 2 months.  We will recheck labs in 2 months.  - Vitamin D, Ergocalciferol, (DRISDOL) 1.25 MG (50000 UNIT) CAPS capsule; Take 1 capsule by mouth every 7 days.  Dispense: 4 capsule; Refill: 1  2. Emotional Eating Behavior Patient will continue working on keeping himself more occupied to decrease emotional eating behavior.  We will continue to follow closely.  3. BMI 40.0-44.9, adult (HCC)  4. Obesity, Beginning BMI 45.48 Kenneth Green is currently in the action stage of change. As such, his goal is to continue with weight loss efforts. He has agreed to the Category 3 Plan.   Exercise goals: As is.   Behavioral modification strategies:  increasing lean protein intake.  Kenneth Green has agreed to follow-up with our clinic in 3 weeks. He was informed of the importance of frequent follow-up visits to maximize his success with intensive lifestyle modifications for his multiple health conditions.   Objective:   Blood pressure (!) 141/89, pulse 69, temperature 98.2 F (36.8 C), height 5\' 9"  (1.753 m), weight 297 lb (134.7 kg), SpO2 98 %. Body mass index is 43.86 kg/m.  Lab Results  Component Value Date   CREATININE 1.08 02/22/2023   BUN 13 02/22/2023   NA 141 02/22/2023   K 4.3 02/22/2023   CL 102 02/22/2023   CO2 24 02/22/2023   Lab Results  Component Value Date   ALT 33 02/22/2023   AST 25 02/22/2023   ALKPHOS 62 02/22/2023   BILITOT 0.3 02/22/2023   Lab Results  Component Value Date   HGBA1C 6.0 (H) 02/22/2023   HGBA1C 5.4 09/13/2022   HGBA1C 5.4 04/26/2022   HGBA1C 5.4 01/19/2022   HGBA1C 5.4 11/11/2021   Lab Results  Component Value Date   INSULIN 39.6 (H) 02/22/2023   INSULIN 10.2 09/13/2022   INSULIN 23.5 04/26/2022   INSULIN 20.5 11/11/2021   INSULIN 12.8 01/15/2021   Lab Results  Component Value Date   TSH 1.020 02/22/2023   Lab Results  Component Value Date   CHOL 200 (H) 02/22/2023   HDL 53 02/22/2023   LDLCALC 131 (H) 02/22/2023   TRIG 91 02/22/2023   Lab Results  Component Value Date  VD25OH 33.6 02/22/2023   VD25OH 31.4 01/05/2023   VD25OH 41.0 09/13/2022   Lab Results  Component Value Date   WBC 5.1 02/22/2023   HGB 14.2 02/22/2023   HCT 43.3 02/22/2023   MCV 86 02/22/2023   PLT 205 02/22/2023   No results found for: "IRON", "TIBC", "FERRITIN"  Attestation Statements:   Reviewed by clinician on day of visit: allergies, medications, problem list, medical history, surgical history, family history, social history, and previous encounter notes.   I, Burt Knack, am acting as transcriptionist for Quillian Quince, MD.  I have reviewed the above documentation for accuracy  and completeness, and I agree with the above. -  Quillian Quince, MD

## 2023-05-09 ENCOUNTER — Encounter: Payer: Self-pay | Admitting: Behavioral Health

## 2023-05-09 ENCOUNTER — Telehealth (INDEPENDENT_AMBULATORY_CARE_PROVIDER_SITE_OTHER): Payer: 59 | Admitting: Psychology

## 2023-05-09 ENCOUNTER — Ambulatory Visit (INDEPENDENT_AMBULATORY_CARE_PROVIDER_SITE_OTHER): Payer: 59 | Admitting: Behavioral Health

## 2023-05-09 DIAGNOSIS — F5089 Other specified eating disorder: Secondary | ICD-10-CM

## 2023-05-09 DIAGNOSIS — F331 Major depressive disorder, recurrent, moderate: Secondary | ICD-10-CM | POA: Diagnosis not present

## 2023-05-09 DIAGNOSIS — F4323 Adjustment disorder with mixed anxiety and depressed mood: Secondary | ICD-10-CM

## 2023-05-09 DIAGNOSIS — F411 Generalized anxiety disorder: Secondary | ICD-10-CM

## 2023-05-09 DIAGNOSIS — F3289 Other specified depressive episodes: Secondary | ICD-10-CM

## 2023-05-09 MED ORDER — SERTRALINE HCL 50 MG PO TABS: 50.0000 mg | ORAL_TABLET | Freq: Every day | ORAL | 2 refills | Status: DC

## 2023-05-09 MED ORDER — BUPROPION HCL ER (SR) 200 MG PO TB12
ORAL_TABLET | ORAL | 2 refills | Status: DC
Start: 2023-05-09 — End: 2023-11-01

## 2023-05-09 NOTE — Progress Notes (Signed)
[facility]  Patient ID: BRAN LYU,  MRN: [id number]  PCP: Gweneth Dimitri, MD  Date of Evaluation: 05/09/2023 Time spent:20 minutes  Chief Complaint:  Chief Complaint   Anxiety; Depression; Follow-up; Medication Refill; Patient Education     HISTORY/CURRENT STATUS: HPI 57 year old male presents to this office for follow up and medication management. He is calm and smiling. Says "every thing is on track and doing great".  Says this has been a good period and he does not want to change or adjust medications this visit. He continues in psychotherapy.  He reports his anxiety today at 3/10  and depression at 3/10. Says he is sleeping 7-8 hours nightly. Denies mania, no psychosis. No SI/HI.    No Past medication failures     Individual Medical History/ Review of Systems: Changes? :No   Allergies: Iohexol  Current Medications:  Current Outpatient Medications:    benazepril-hydrochlorthiazide (LOTENSIN HCT) 10-12.5 MG tablet, Take 1 tablet by mouth 2 (two) times daily., Disp: 60 tablet, Rfl: 1   buPROPion (WELLBUTRIN SR) 200 MG 12 hr tablet, TAKE 1 TABLET BY MOUTH TWICE  DAILY, Disp: 180 tablet, Rfl: 2   fexofenadine (ALLEGRA) 180 MG tablet, Take 180 mg by mouth daily., Disp: , Rfl:    latanoprost (XALATAN) 0.005 % ophthalmic solution, SMARTSIG:In Eye(s), Disp: , Rfl:    pregabalin (LYRICA) 150 MG capsule, Take 150 mg by mouth 2 (two) times daily., Disp: , Rfl:    sertraline (ZOLOFT) 50 MG tablet, Take 1 tablet (50 mg total) by mouth daily., Disp: 90 tablet, Rfl: 2   Vitamin D, Ergocalciferol, (DRISDOL) 1.25 MG (50000 UNIT) CAPS capsule, Take 1 capsule by mouth every 7 days., Disp: 4 capsule, Rfl: 1 Medication Side Effects: none  Family Medical/ Social History: Changes? No  MENTAL HEALTH EXAM:  There were no vitals taken for this visit.There is no height or weight on file to calculate BMI.  General Appearance: Casual, Neat, and Well Groomed  Eye Contact:  Good   Speech:  Clear and Coherent  Volume:  Normal  Mood:  NA  Affect:  Appropriate  Thought Process:  Coherent  Orientation:  Full (Time, Place, and Person)  Thought Content: Logical   Suicidal Thoughts:  No  Homicidal Thoughts:  No  Memory:  WNL  Judgement:  Good  Insight:  Good  Psychomotor Activity:  Normal  Concentration:  Concentration: Good  Recall:  Good  Fund of Knowledge: Good  Language: Good  Assets:  Desire for Improvement  ADL's:  Intact  Cognition: WNL  Prognosis:  Good    DIAGNOSES:    ICD-10-CM   1. Other depression with emotional eating  F32.89 buPROPion (WELLBUTRIN SR) 200 MG 12 hr tablet    2. Major depressive disorder, recurrent episode, moderate (HCC)  F33.1 sertraline (ZOLOFT) 50 MG tablet    3. Generalized anxiety disorder  F41.1 sertraline (ZOLOFT) 50 MG tablet      Receiving Psychotherapy: No    RECOMMENDATIONS:  Greater than 50% of 20  min face to face time with patient was spent on counseling and coordination of care.  Happy with his medication right now. No social changes.  We discussed his relationship with spouse and he says things have improved. Reconciled medication.   We discussed the following plan: To continue Zoloft to 50 mg daily Continue Wellbutrin 200 mg SR two times daily To report side effects or worsening symptoms promptly Provided emergency contact information To follow up in 6 months  to reassess. Reviewed PDMP   Joan Flores, NP

## 2023-05-09 NOTE — Progress Notes (Signed)
  Office: [phone]  /  Fax: [phone]    Date: May 09, 2023  Appointment Start Time: 2:31pm Duration: 32 minutes Provider: Lawerance Cruel, Psy.D. Type of Session: Individual Therapy  Location of Patient: Parked in car (address obtained; safe/private location) Location of Provider: Provider's Home (private office) Type of Contact: Telepsychological Visit via MyChart Video Visit  Session Content: Elzia is a 57 y.o. male presenting for a follow-up appointment to address the previously established treatment goal of increasing coping skills.Today's appointment was a telepsychological visit. [name] provided verbal consent for today's telepsychological appointment and he is aware he is responsible for securing confidentiality on his end of the session. Prior to proceeding with today's appointment, Zariah's physical location at the time of this appointment was obtained as well a phone number he could be reached at in the event of technical difficulties. [name] and this provider participated in today's telepsychological service.   This provider conducted a brief check-in. Mai discussed an improvement in his eating habits as he is "not bringing the food into the house." He acknowledged challenges with eating when at work and eating out. Further explored and processed. Reviewed previous recommendations. He noted he started to take food to work for easy access and is consuming steamer vegetables. Moreover, psychoeducation provided regarding self-compassion. Aryon was engaged in a self-compassion exercise to help with eating-related challenges and other ongoing stressors. He was encouraged to regularly ask himself, "What do I need right now?" and "How can I comfort and care for myself in this moment?" Overall, Amal was receptive to today's appointment as evidenced by openness to sharing, responsiveness to feedback, and willingness to work toward increasing self-compassion.  Mental Status  Examination:  Appearance: neat Behavior: appropriate to circumstances Mood: depressed Affect: mood congruent Speech: WNL Eye Contact: intermittent  Psychomotor Activity: WNL Gait: unable to assess Thought Process: linear, logical, and goal directed and no evidence or endorsement of suicidal, homicidal, and self-harm ideation, plan and intent  Thought Content/Perception: no hallucinations, delusions, bizarre thinking or behavior endorsed or observed Orientation: AAOx4 Memory/Concentration: memory, attention, language, and fund of knowledge intact  Insight: fair Judgment: fair  Interventions:  Conducted a brief chart review Provided empathic reflections and validation Reviewed content from the previous session Provided positive reinforcement Employed supportive psychotherapy interventions to facilitate reduced distress and to improve coping skills with identified stressors Psychoeducation provided regarding self-compassion Engaged pt in a self-compassion exercise  DSM-5 Diagnosis(es):  F50.89 Other Specified Feeding or Eating Disorder, Emotional Eating Behaviors and  F43.23 Adjustment Disorder with Mixed Anxiety and Depressed Mood  Treatment Goal & Progress: During the initial appointment with this Progress is limited, as Dangelo has just begun treatment with this provider; however, he is receptive to the interaction and interventions and he also discussed implementing discussed strategies.  Plan: The next appointment is scheduled for 05/30/2023 at 2:30pm, which will be via MyChart Video Visit. The next session will focus on working towards the established treatment goal. Leaman will continue with his primary therapist and psychiatric provider.

## 2023-05-11 ENCOUNTER — Ambulatory Visit: Payer: 59 | Admitting: Psychology

## 2023-05-11 ENCOUNTER — Ambulatory Visit (INDEPENDENT_AMBULATORY_CARE_PROVIDER_SITE_OTHER): Payer: 59 | Admitting: Family Medicine

## 2023-05-11 ENCOUNTER — Encounter (INDEPENDENT_AMBULATORY_CARE_PROVIDER_SITE_OTHER): Payer: Self-pay | Admitting: Family Medicine

## 2023-05-11 VITALS — BP 144/85 | HR 65 | Temp 98.5°F | Ht 69.0 in | Wt 295.0 lb

## 2023-05-11 DIAGNOSIS — E559 Vitamin D deficiency, unspecified: Secondary | ICD-10-CM | POA: Diagnosis not present

## 2023-05-11 DIAGNOSIS — E669 Obesity, unspecified: Secondary | ICD-10-CM | POA: Diagnosis not present

## 2023-05-11 DIAGNOSIS — Z6841 Body Mass Index (BMI) 40.0 and over, adult: Secondary | ICD-10-CM | POA: Diagnosis not present

## 2023-05-11 DIAGNOSIS — F4323 Adjustment disorder with mixed anxiety and depressed mood: Secondary | ICD-10-CM

## 2023-05-11 NOTE — Progress Notes (Signed)
[facility] Counselor/Therapist Progress Note  [name], MRN: [id number],    Date: 05/11/2023  Time Spent: 45 mins  Treatment Type: Individual Therapy  Reported Symptoms: Pt presented via Caregility video for follow up session, stating his understanding of the limits of virtual visits and granting consent for the session.  Pt stated he was in his car and no one else was present.  I shared with pt that I am in my office and no one else is present.  Mental Status Exam: Appearance:  Casual     Behavior: Appropriate  Motor: Normal  Speech/Language:  Clear and Coherent  Affect: Appropriate  Mood: normal  Thought process: normal  Thought content:   WNL  Sensory/Perceptual disturbances:   WNL  Orientation: oriented to person, place, and time/date  Attention: Good  Concentration: Good  Memory: WNL  Fund of knowledge:  Good  Insight:   Good  Judgment:  Good  Impulse Control: Good   Risk Assessment: Danger to Self:  No Self-injurious Behavior: No Danger to Others: No Duty to Warn:no Physical Aggression / Violence:No  Access to Firearms a concern: No  Gang Involvement:No   Subjective: Pt shares that "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  Pt shares he is dealing with "trigger finger"; overuse of joint and is using a splint to help it heal and that seems to be helping.  Pt shares that Kenneth Green has had fewer appts for him to take her to so he has been able to get more sleep, once he gets to sleep.  He has recently had more trouble tuning his brain off in order to fall asleep.  Encouraged pt to focus on his breathing as a mean of clearing his mind and falling asleep more quickly.  Work is going pretty well for pt at this time.  Pt is getting squash and cucumbers and his green beans should be ready in a week or two.  His corn looks good as well.   His school teaching daughter is in [place] for a week or so on vacation right now.  His other daughter who is pregnant is doing well and still working full time.  Pt shares that his grand daughter is doing well and still comes to spend a day a week with them.  Pt shares, "I have been think a lot about my re-enactment experiences.  I have told myself I need to stop but I still keep going.  I have had my fill of it but I still keep engaging in it.  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  Pt shares that he has another appt today with [facility]; he is struggling with his eating plan.  He is talking with their psychologist for support on making better choices.  Talked with pt about "stinking thinking" from the 12 -step perspective and how to realize when he is doing it and how to stop himself.  Pt shares that he had a nice Father's Day with his daughters and they had a cookout at his mother's home that weekend with extended family.  Talked with pt about the benefits of being as kind to himself  as he is to others.  Pt shares he is not sure what that would look like.  Encouraged pt to become more aware of how he treats others and use those same methods to be kind, caring, and considerate for himself.  Pt shares he will work on this between now and our follow up session.  Encouraged pt to continue with his self care activities and we will meet in 4 wks for a follow up session.  Interventions: Cognitive Behavioral Therapy  Diagnosis:Adjustment disorder with mixed anxiety and depressed mood  Plan: Treatment Plan Strengths/Abilities:  Intelligent, Intuitive, Willing to participate in therapy Treatment Preferences:  Outpatient Individual Therapy Statement of Needs:  Patient is to use CBT, mindfulness and coping skills to help manage and/or decrease symptoms  associated with their diagnosis. Symptoms:  Depressed/Irritable mood, worry, social withdrawal Problems Addressed:  Depressive thoughts, Sadness, Sleep issues, etc. Long Term Goals:  Pt to reduce overall level, frequency, and intensity of the feelings of depression as evidenced by decreased irritability, negative self talk, and helpless feelings from 6 to 7 days/week to 0 to 1 days/week, per client report, for at least 3 consecutive months.  Progress: 20% Short Term Goals:  Pt to verbally express understanding of the relationship between feelings of depression and their impact on thinking patterns and behaviors.  Pt to verbalize an understanding of the role that distorted thinking plays in creating fears, excessive worry, and ruminations.  Progress: 20% Target Date:  11/18/2023 Frequency:  Bi-weekly Modality:  Cognitive Behavioral Therapy Interventions by Therapist:  Therapist will use CBT, Mindfulness exercises, Coping skills and Referrals, as needed by client. Client has verbally approved this treatment plan.  Karie Kirks, [facility]

## 2023-05-12 MED ORDER — VITAMIN D (ERGOCALCIFEROL) 1.25 MG (50000 UNIT) PO CAPS: 50000.0000 [IU] | ORAL_CAPSULE | ORAL | 1 refills | Status: DC

## 2023-05-17 NOTE — Progress Notes (Signed)
Chief Complaint:   OBESITY Kenneth Green is here to discuss his progress with his obesity treatment plan along with follow-up of his obesity related diagnoses. Kenneth Green is on the Category 3 Plan and states he is following his eating plan approximately 60% of the time. Kenneth Green states he is walking for 45 minutes 5 times per week.  Today's visit was #: 57 Starting weight: 308 lbs Starting date: 04/10/2019 Today's weight: 295 lbs Today's date: 05/11/2023 Total lbs lost to date: 13 Total lbs lost since last in-office visit: 2  Interim History: Patient is struggling with journaling.  He has done well with his weight loss, but he is struggling with feeling guilty about his dietary indiscretions.  Subjective:   1. Vitamin D deficiency Patient is stable on vitamin D, and he requests a refill.  He denies nausea, vomiting, or muscle weakness.  Assessment/Plan:   1. Vitamin D deficiency Patient will continue prescription vitamin D once weekly, and we will refill for 2 months.  - Vitamin D, Ergocalciferol, (DRISDOL) 1.25 MG (50000 UNIT) CAPS capsule; Take 1 capsule by mouth every 7 days.  Dispense: 4 capsule; Refill: 1  2. BMI 40.0-44.9, adult (HCC)  3. Obesity, Beginning BMI 45.48 Kenneth Green is currently in the action stage of change. As such, his goal is to continue with weight loss efforts. He has agreed to the Category 3 Plan.   Patient is to work on Orthoptist and not worrying about meeting calorie goals, just do the math to see how close he is to his goal.  Exercise goals: As is.  Behavioral modification strategies: increasing lean protein intake.  Kenneth Green has agreed to follow-up with our clinic in 2 to 3 weeks. He was informed of the importance of frequent follow-up visits to maximize his success with intensive lifestyle modifications for his multiple health conditions.   Objective:   Blood pressure (!) 144/85, pulse 65, temperature 98.5 F (36.9 C), height 5\' 9"  (1.753 m), weight  295 lb (133.8 kg), SpO2 98 %. Body mass index is 43.56 kg/m.  Lab Results  Component Value Date   CREATININE 1.08 02/22/2023   BUN 13 02/22/2023   NA 141 02/22/2023   K 4.3 02/22/2023   CL 102 02/22/2023   CO2 24 02/22/2023   Lab Results  Component Value Date   ALT 33 02/22/2023   AST 25 02/22/2023   ALKPHOS 62 02/22/2023   BILITOT 0.3 02/22/2023   Lab Results  Component Value Date   HGBA1C 6.0 (H) 02/22/2023   HGBA1C 5.4 09/13/2022   HGBA1C 5.4 04/26/2022   HGBA1C 5.4 01/19/2022   HGBA1C 5.4 11/11/2021   Lab Results  Component Value Date   INSULIN 39.6 (H) 02/22/2023   INSULIN 10.2 09/13/2022   INSULIN 23.5 04/26/2022   INSULIN 20.5 11/11/2021   INSULIN 12.8 01/15/2021   Lab Results  Component Value Date   TSH 1.020 02/22/2023   Lab Results  Component Value Date   CHOL 200 (H) 02/22/2023   HDL 53 02/22/2023   LDLCALC 131 (H) 02/22/2023   TRIG 91 02/22/2023   Lab Results  Component Value Date   VD25OH 33.6 02/22/2023   VD25OH 31.4 01/05/2023   VD25OH 41.0 09/13/2022   Lab Results  Component Value Date   WBC 5.1 02/22/2023   HGB 14.2 02/22/2023   HCT 43.3 02/22/2023   MCV 86 02/22/2023   PLT 205 02/22/2023   No results found for: "IRON", "TIBC", "FERRITIN"  Attestation Statements:   Reviewed by  clinician on day of visit: allergies, medications, problem list, medical history, surgical history, family history, social history, and previous encounter notes.  Time spent on visit including pre-visit chart review and post-visit care and charting was 30 minutes.   I, Burt Knack, am acting as transcriptionist for Quillian Quince, MD.  I have reviewed the above documentation for accuracy and completeness, and I agree with the above. -  Quillian Quince, MD

## 2023-05-24 LAB — BASIC METABOLIC PANEL
BUN: 15 (ref 4–21)
CO2: 30 — AB (ref 13–22)
Chloride: 102 (ref 99–108)
Creatinine: 1.1 (ref 0.6–1.3)
Glucose: 104
Potassium: 4.1 mEq/L (ref 3.5–5.1)
Sodium: 138 (ref 137–147)

## 2023-05-24 LAB — COMPREHENSIVE METABOLIC PANEL
Albumin: 4.3 (ref 3.5–5.0)
Calcium: 9.7 (ref 8.7–10.7)
eGFR: 76

## 2023-05-24 LAB — HEPATIC FUNCTION PANEL
ALT: 28 U/L (ref 10–40)
AST: 23 (ref 14–40)
Bilirubin, Total: 0.5

## 2023-05-24 LAB — LIPID PANEL
Cholesterol: 177 (ref 0–200)
HDL: 44 (ref 35–70)
LDL Cholesterol: 110
Triglycerides: 124 (ref 40–160)

## 2023-05-24 LAB — PSA: PSA: 0.28

## 2023-05-30 ENCOUNTER — Telehealth (INDEPENDENT_AMBULATORY_CARE_PROVIDER_SITE_OTHER): Payer: 59 | Admitting: Psychology

## 2023-05-30 DIAGNOSIS — F5089 Other specified eating disorder: Secondary | ICD-10-CM

## 2023-05-30 DIAGNOSIS — F4323 Adjustment disorder with mixed anxiety and depressed mood: Secondary | ICD-10-CM

## 2023-05-30 NOTE — Progress Notes (Signed)
  Office: [phone]  /  Fax: [phone]    Date: May 30, 2023  Appointment Start Time: 2:30pm Duration: 30 minutes Provider: Lawerance Cruel, Psy.D. Type of Session: Individual Therapy  Location of Patient: Home (private location) Location of Provider: Provider's Home (private office) Type of Contact: Telepsychological Visit via MyChart Video Visit  Session Content: Kenneth Green is a 57 y.o. male presenting for a follow-up appointment to address the previously established treatment goal of increasing coping skills.Today's appointment was a telepsychological visit. Kenneth Green provided verbal consent for today's telepsychological appointment and he is aware he is responsible for securing confidentiality on his end of the session. Prior to proceeding with today's appointment, Kenneth Green's physical location at the time of this appointment was obtained as well a phone number he could be reached at in the event of technical difficulties. Kenneth Green and this provider participated in today's telepsychological service.   This provider conducted a brief check-in. Kenneth Green stated the "eating has not been good," adding, "journaling is depressing." Further explored and processed. Today's appointment focused on mindful eating (e.g., sit down, slowly chew, savor, simplify, smile). He was observed taking notes. Overall, [name] was receptive to today's appointment as evidenced by openness to sharing, responsiveness to feedback, and willingness to implement discussed strategies .  Mental Status Examination:  Appearance: neat Behavior: appropriate to circumstances Mood: depressed Affect: mood congruent Speech: WNL Eye Contact: intermittent  Psychomotor Activity: WNL Gait: unable to assess Thought Process: linear, logical, and goal directed and no evidence or endorsement of suicidal, homicidal, and self-harm ideation, plan and intent  Thought Content/Perception: no hallucinations, delusions, bizarre thinking or behavior  endorsed or observed Orientation: AAOx4 Memory/Concentration: memory, attention, language, and fund of knowledge intact  Insight: fair Judgment: fair  Interventions:  Conducted a brief chart review Provided empathic reflections and validation Provided positive reinforcement Employed supportive psychotherapy interventions to facilitate reduced distress and to improve coping skills with identified stressors Psychoeducation provided regarding mindful eating  DSM-5 Diagnosis(es): 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 [name] has demonstrated progress in his goal as evidenced by increased awareness of current hunger patterns. [name] also continues to demonstrate willingness to engage in learned skill(s).  Plan: The next appointment is scheduled for 06/28/2023 at 2:30pm, which will be via MyChart Video Visit. The next session will focus on working towards the established treatment goal. [name] will continue with his primary therapist.

## 2023-06-08 ENCOUNTER — Ambulatory Visit (INDEPENDENT_AMBULATORY_CARE_PROVIDER_SITE_OTHER): Payer: 59 | Admitting: Family Medicine

## 2023-06-08 ENCOUNTER — Ambulatory Visit: Payer: 59 | Admitting: Psychology

## 2023-06-08 DIAGNOSIS — F4323 Adjustment disorder with mixed anxiety and depressed mood: Secondary | ICD-10-CM | POA: Diagnosis not present

## 2023-06-08 NOTE — Progress Notes (Signed)
[facility] Counselor/Therapist Progress Note  [name], MRN: [id number],    Date: 06/08/2023  Time Spent: 45 mins  start time: 1300   end time 1345  Treatment Type: Individual Therapy  Reported Symptoms: Pt presented via Caregility video for follow up session, stating his understanding of the limits of virtual visits and granting consent for the session.  Pt stated he was in his home and no one else was present.  I shared with pt that I am in my office and no one else is present.  Mental Status Exam: Appearance:  Casual     Behavior: Appropriate  Motor: Normal  Speech/Language:  Clear and Coherent  Affect: Appropriate  Mood: normal  Thought process: normal  Thought content:   WNL  Sensory/Perceptual disturbances:   WNL  Orientation: oriented to person, place, and time/date  Attention: Good  Concentration: Good  Memory: WNL  Fund of knowledge:  Good  Insight:   Good  Judgment:  Good  Impulse Control: Good   Risk Assessment: Danger to Self:  No Self-injurious Behavior: No Danger to Others: No Duty to Warn:no Physical Aggression / Violence:No  Access to Firearms a concern: No  Gang Involvement:No   Subjective: Pt shares that "Things are about the same as usual.  I am still having trouble with my finger so my PCP is referring me to a hand specialist.  [name] is doing about the same.  Her eye is doing OK; we are waiting to hear about the next cornea transplant to try to improve her vision.  Her back is still killing her.  She has lots of appts coming up for several conditions."  Pt shares that he is still doing all of the cooking, cleaning, helping [name] get showered and dressed daily, etc.  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  I am just laying low, doing my job, and trying to stay off everyone else's radar.  I have not been getting  any feedback so I have been asking for it from managers; I am keeping track of of their responses that I am doing great and that everything is fine."  Pt shares that he continues to use deep breathing as a means of calming himself down.  He shares he continues to stress eat from time to time and he feels negative when that happens.  Pt continues to work on his garden; corn is not quite ready; tomatoes are coming in well.  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  His daughter Printmaker) is now in [place] visiting friends on vacation; she starts her new job with [employer] in early August.  His other daughter is still working hard and her pregnancy is going well; she is due in August.  Pt went to another re-enactment event near [place], [place] and it was very hot at the event.  This event caused him to truly question his continued his continued involvement; he was not feeling good in all the heat.  He believes that was his last event; he is still considering doing some exhibiting but is unsure about that.  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  Pt shares his drinking behavior is fine; he  is not over-using at this time and is being intentional about his management of it.   Pt is continuing to talk with the psychologist from [facility] for support on making better eating choices.  Encouraged pt to be as intentional as possible about interrupting his negative thinking when he makes a mistake with a food choice.  Encouraged pt to continue with his self care activities and we will meet in 3 wks for a follow up session.  Interventions: Cognitive Behavioral Therapy  Diagnosis:Adjustment disorder with mixed anxiety and depressed mood  Plan: Treatment Plan Strengths/Abilities:  Intelligent, Intuitive, Willing to participate in therapy Treatment Preferences:  Outpatient Individual Therapy Statement of  Needs:  Patient is to use CBT, mindfulness and coping skills to help manage and/or decrease symptoms associated with their diagnosis. Symptoms:  Depressed/Irritable mood, worry, social withdrawal Problems Addressed:  Depressive thoughts, Sadness, Sleep issues, etc. Long Term Goals:  Pt to reduce overall level, frequency, and intensity of the feelings of depression as evidenced by decreased irritability, negative self talk, and helpless feelings from 6 to 7 days/week to 0 to 1 days/week, per client report, for at least 3 consecutive months.  Progress: 20% Short Term Goals:  Pt to verbally express understanding of the relationship between feelings of depression and their impact on thinking patterns and behaviors.  Pt to verbalize an understanding of the role that distorted thinking plays in creating fears, excessive worry, and ruminations.  Progress: 20% Target Date:  11/18/2023 Frequency:  Bi-weekly Modality:  Cognitive Behavioral Therapy Interventions by Therapist:  Therapist will use CBT, Mindfulness exercises, Coping skills and Referrals, as needed by client. Client has verbally approved this treatment plan.  Karie Kirks, [facility]

## 2023-06-22 ENCOUNTER — Ambulatory Visit (INDEPENDENT_AMBULATORY_CARE_PROVIDER_SITE_OTHER): Payer: 59 | Admitting: Family Medicine

## 2023-06-23 ENCOUNTER — Ambulatory Visit (INDEPENDENT_AMBULATORY_CARE_PROVIDER_SITE_OTHER): Payer: 59 | Admitting: Family Medicine

## 2023-06-28 ENCOUNTER — Telehealth (INDEPENDENT_AMBULATORY_CARE_PROVIDER_SITE_OTHER): Payer: Self-pay | Admitting: Psychology

## 2023-06-28 ENCOUNTER — Ambulatory Visit (INDEPENDENT_AMBULATORY_CARE_PROVIDER_SITE_OTHER): Payer: Self-pay | Admitting: Psychology

## 2023-06-28 DIAGNOSIS — F5089 Other specified eating disorder: Secondary | ICD-10-CM

## 2023-06-28 DIAGNOSIS — F4323 Adjustment disorder with mixed anxiety and depressed mood: Secondary | ICD-10-CM

## 2023-06-28 NOTE — Progress Notes (Signed)
  Office: [phone]  /  Fax: [phone]    Date: June 28, 2023  Appointment Start Time: 2:31pm Duration: 32 minutes Provider: Lawerance Cruel, Psy.D. Type of Session: Individual Therapy  Location of Patient: Home (private location) Location of Provider: Provider's Home (private office) Type of Contact: Telepsychological Visit via MyChart Video Visit  Session Content: Quanell is a 57 y.o. male presenting for a follow-up appointment to address the previously established treatment goal of increasing coping skills.Today's appointment was a telepsychological visit. [name] provided verbal consent for today's telepsychological appointment and he is aware he is responsible for securing confidentiality on his end of the session. Prior to proceeding with today's appointment, Lathan's physical location at the time of this appointment was obtained as well a phone number he could be reached at in the event of technical difficulties. [name] and this provider participated in today's telepsychological service.   This provider conducted a brief check-in. Daymien reported, "I've been struggling" due to physical concerns. Further explored. Reviewed self-compassion. Psychoeducation regarding thought defusion, including its impact on emotional eating and overall well-being was provided. Sanat was led through thought defusion exercises and for the thought defusion exercises during today's appointment, Jeffer utilized the following thought: "I am inconsistent." His experience was processed after the exercises. [name] provided verbal consent during today's appointment for this provider to send a handout with thought defusion exercises via e-mail. Notably, he reported implementing previously discussed mindful eating strategies. Further explored and processed. He acknowledged he eats quickly. Ermine expressed willingness to continue trying to eat with his non-dominant hand. Overall, Aayam was receptive to today's  appointment as evidenced by openness to sharing, responsiveness to feedback, and willingness to engage in thought defusion exercises to assist with coping.  Mental Status Examination:  Appearance: neat Behavior: appropriate to circumstances Mood: sad Affect: mood congruent Speech: WNL Eye Contact: appropriate Psychomotor Activity: WNL Gait: unable to assess Thought Process: linear, logical, and goal directed and no evidence or endorsement of suicidal, homicidal, and self-harm ideation, plan and intent  Thought Content/Perception: no hallucinations, delusions, bizarre thinking or behavior endorsed or observed Orientation: AAOx4 Memory/Concentration: intact Insight: fair Judgment: fair  Interventions:  Provided empathic reflections and validation Reviewed content from the previous session Provided positive reinforcement Employed supportive psychotherapy interventions to facilitate reduced distress and to improve coping skills with identified stressors Psychoeducation provided regarding thought defusion Engaged patient in thought defusion exercise(s)  DSM-5 Diagnosis(es):  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 [name] has demonstrated progress in his goal as evidenced by increased awareness of hunger patterns. Kaptain also continues to demonstrate willingness to engage in learned skill(s).  Plan: The next appointment is scheduled for 07/19/2023 at 4:30pm, which will be via MyChart Video Visit. The next session will focus on working towards the established treatment goal. Jabron will continue with his primary therapist.

## 2023-06-29 ENCOUNTER — Ambulatory Visit: Payer: 59 | Admitting: Psychology

## 2023-06-29 DIAGNOSIS — F4323 Adjustment disorder with mixed anxiety and depressed mood: Secondary | ICD-10-CM

## 2023-06-29 NOTE — Progress Notes (Signed)
[facility] Counselor/Therapist Progress Note  [name], MRN: [id number],    Date: 06/29/2023  Time Spent: 45 mins  start time: 1400   end time 1445  Treatment Type: Individual Therapy  Reported Symptoms: Pt presented via Caregility video for follow up session, stating his understanding of the limits of virtual visits and granting consent for the session.  Pt stated he was in his home and no one else was present.  I shared with pt that I am in my office and no one else is present.  Mental Status Exam: Appearance:  Casual     Behavior: Appropriate  Motor: Normal  Speech/Language:  Clear and Coherent  Affect: Appropriate  Mood: normal  Thought process: normal  Thought content:   WNL  Sensory/Perceptual disturbances:   WNL  Orientation: oriented to person, place, and time/date  Attention: Good  Concentration: Good  Memory: WNL  Fund of knowledge:  Good  Insight:   Good  Judgment:  Good  Impulse Control: Good   Risk Assessment: Danger to Self:  No Self-injurious Behavior: No Danger to Others: No Duty to Warn:no Physical Aggression / Violence:No  Access to Firearms a concern: No  Gang Involvement:No   Subjective: Pt shares that "Things are about the same as usual.  I am still having trouble with my finger so my PCP is referring me to a hand specialist; I am still waiting for my appt; it is scheduled but I am still waiting for hit."  Pt shares that Kenneth Green's eye is a little better but her back is a bit worse.  He shares that her eye looks normal but now she has a cataract in it that is not helping her vision.  She has seen different physicians about her back and they are trying to develop a treatment plan that will work.  Pt shares he is still having leg pain from his back; he goes back to see his back specialist in a couple of weeks.  Pt shares that he garden "has done much better this year than it has done in the past.  Pt shares he is finished with  parts of it and some parts are still growing.  Pt shares that his daughter has started back to school already in her new [employer] school (8th grade) and the kids start next week.  Her commute is shorter than it was to her old school.  Pt shares that work continues "to go pretty good; it has been quiet and quiet is good."  Pt shares that some of the uncertainty at work has settled down and he is happy about that.  Pt shares that the managers at work have gotten a little bit better about providing positive feedback for pt and he appreciates that.  Pt shares his other daughter is due with her baby in one week so she could go any time.  He is excited and his daughter "is ready for him to come soon."  Pt shares that he continues to drink with about the same frequency as before and he has no concern about it at this point.  He has not been reading any more of his AA book because he has been busy with other projects.  Pt is still planning to go to re-enactments because he enjoys the environment but he is no longer going to be participating in the field.  The next event is on Kenneth Green.  Pt  is continuing to work with the [facility] NP's and psychologist for better eating choices and he feels that that is being helpful for him.  Encouraged pt to be as intentional as possible about interrupting his negative thinking when he makes a mistake with a food choice.  Encouraged pt to continue with his self care activities and we will meet in 4 wks for a follow up session.  Interventions: Cognitive Behavioral Therapy  Diagnosis:Adjustment disorder with mixed anxiety and depressed mood  Plan: Treatment Plan Strengths/Abilities:  Intelligent, Intuitive, Willing to participate in therapy Treatment Preferences:  Outpatient Individual Therapy Statement of Needs:  Patient is to use CBT, mindfulness and coping skills to help manage and/or decrease symptoms associated with their  diagnosis. Symptoms:  Depressed/Irritable mood, worry, social withdrawal Problems Addressed:  Depressive thoughts, Sadness, Sleep issues, etc. Long Term Goals:  Pt to reduce overall level, frequency, and intensity of the feelings of depression as evidenced by decreased irritability, negative self talk, and helpless feelings from 6 to 7 days/week to 0 to 1 days/week, per client report, for at least 3 consecutive months.  Progress: 20% Short Term Goals:  Pt to verbally express understanding of the relationship between feelings of depression and their impact on thinking patterns and behaviors.  Pt to verbalize an understanding of the role that distorted thinking plays in creating fears, excessive worry, and ruminations.  Progress: 20% Target Date:  11/18/2023 Frequency:  Bi-weekly Modality:  Cognitive Behavioral Therapy Interventions by Therapist:  Therapist will use CBT, Mindfulness exercises, Coping skills and Referrals, as needed by client. Client has verbally approved this treatment plan.  Karie Kirks, [facility]

## 2023-07-07 ENCOUNTER — Other Ambulatory Visit (HOSPITAL_COMMUNITY): Payer: Self-pay

## 2023-07-07 ENCOUNTER — Encounter (INDEPENDENT_AMBULATORY_CARE_PROVIDER_SITE_OTHER): Payer: Self-pay | Admitting: Family Medicine

## 2023-07-07 ENCOUNTER — Ambulatory Visit (INDEPENDENT_AMBULATORY_CARE_PROVIDER_SITE_OTHER): Payer: 59 | Admitting: Family Medicine

## 2023-07-07 VITALS — BP 139/88 | HR 66 | Temp 98.2°F | Ht 69.0 in | Wt 299.0 lb

## 2023-07-07 DIAGNOSIS — Z6841 Body Mass Index (BMI) 40.0 and over, adult: Secondary | ICD-10-CM | POA: Diagnosis not present

## 2023-07-07 DIAGNOSIS — E1169 Type 2 diabetes mellitus with other specified complication: Secondary | ICD-10-CM | POA: Diagnosis not present

## 2023-07-07 DIAGNOSIS — Z7985 Long-term (current) use of injectable non-insulin antidiabetic drugs: Secondary | ICD-10-CM

## 2023-07-07 DIAGNOSIS — E559 Vitamin D deficiency, unspecified: Secondary | ICD-10-CM

## 2023-07-07 DIAGNOSIS — E669 Obesity, unspecified: Secondary | ICD-10-CM | POA: Diagnosis not present

## 2023-07-07 MED ORDER — VITAMIN D (ERGOCALCIFEROL) 1.25 MG (50000 UNIT) PO CAPS
50000.0000 [IU] | ORAL_CAPSULE | ORAL | 1 refills | Status: DC
Start: 2023-07-07 — End: 2023-08-09

## 2023-07-07 MED ORDER — TIRZEPATIDE 2.5 MG/0.5ML ~~LOC~~ SOAJ
2.5000 mg | SUBCUTANEOUS | 0 refills | Status: DC
Start: 2023-07-07 — End: 2023-08-09
  Filled 2023-07-07: qty 2, 28d supply, fill #0

## 2023-07-12 NOTE — Progress Notes (Signed)
Chief Complaint:   OBESITY Kenneth Green is here to discuss his progress with his obesity treatment plan along with follow-up of his obesity related diagnoses. Kenneth Green is on the Category 3 Plan and states he is following his eating plan approximately 20% of the time. Bosco states he is walking for 45 minutes 5 times per week.  Today's visit was #: 58 Starting weight: 308 lbs Starting date: 04/10/2019 Today's weight: 299 lbs Today's date: 07/07/2023 Total lbs lost to date: 9 Total lbs lost since last in-office visit: 0  Interim History: Patient is struggling with motivation to change his eating.  He does well with meeting his protein goals but he is still gaining weight.  Subjective:   1. Type 2 diabetes mellitus with other specified complication, unspecified whether long term insulin use (HCC) Patient has been off his GLP-1 Reginal Lutes) due to insurance issues.  He notes polyphagia.  He has done well with diet controlling his glucose but his A1c has been elevated.  2. Vitamin D deficiency Patient is stable on vitamin D prescription with no side effects noted.  Assessment/Plan:   1. Type 2 diabetes mellitus with other specified complication, unspecified whether long term insulin use (HCC) Patient agreed to start Mounjaro 2.5 mg once weekly with no refills.  - tirzepatide [facility]) 2.5 MG/0.5ML Pen; Inject 2.5 mg into the skin once a week.  Dispense: 2 mL; Refill: 0  2. Vitamin D deficiency Patient will continue prescription vitamin D once weekly, and we will refill for 2 months.  - Vitamin D, Ergocalciferol, (DRISDOL) 1.25 MG (50000 UNIT) CAPS capsule; Take 1 capsule by mouth every 7 days.  Dispense: 4 capsule; Refill: 1  3. BMI 40.0-44.9, adult (HCC)  4. Obesity, Beginning BMI 45.48 Kenneth Green is currently in the action stage of change. As such, his goal is to continue with weight loss efforts. He has agreed to the Category 4 Plan and keeping a food journal and adhering to  recommended goals of 500-600 calories and 40+ grams of protein at supper daily.   Exercise goals: As is.   Behavioral modification strategies: increasing lean protein intake and meal planning and cooking strategies.  [name] has agreed to follow-up with our clinic in 4 weeks. He was informed of the importance of frequent follow-up visits to maximize his success with intensive lifestyle modifications for his multiple health conditions.   Objective:   Blood pressure 139/88, pulse 66, temperature 98.2 F (36.8 C), height 5\' 9"  (1.753 m), weight 299 lb (135.6 kg), SpO2 95%. Body mass index is 44.15 kg/m.  Lab Results  Component Value Date   CREATININE 1.1 05/24/2023   BUN 15 05/24/2023   NA 138 05/24/2023   K 4.1 05/24/2023   CL 102 05/24/2023   CO2 30 (A) 05/24/2023   Lab Results  Component Value Date   ALT 28 05/24/2023   AST 23 05/24/2023   ALKPHOS 62 02/22/2023   BILITOT 0.3 02/22/2023   Lab Results  Component Value Date   HGBA1C 6.0 (H) 02/22/2023   HGBA1C 5.4 09/13/2022   HGBA1C 5.4 04/26/2022   HGBA1C 5.4 01/19/2022   HGBA1C 5.4 11/11/2021   Lab Results  Component Value Date   INSULIN 39.6 (H) 02/22/2023   INSULIN 10.2 09/13/2022   INSULIN 23.5 04/26/2022   INSULIN 20.5 11/11/2021   INSULIN 12.8 01/15/2021   Lab Results  Component Value Date   TSH 1.020 02/22/2023   Lab Results  Component Value Date   CHOL 177 05/24/2023  HDL 44 05/24/2023   LDLCALC 110 05/24/2023   TRIG 124 05/24/2023   Lab Results  Component Value Date   VD25OH 33.6 02/22/2023   VD25OH 31.4 01/05/2023   VD25OH 41.0 09/13/2022   Lab Results  Component Value Date   WBC 5.1 02/22/2023   HGB 14.2 02/22/2023   HCT 43.3 02/22/2023   MCV 86 02/22/2023   PLT 205 02/22/2023   No results found for: "IRON", "TIBC", "FERRITIN"  Attestation Statements:   Reviewed by clinician on day of visit: allergies, medications, problem list, medical history, surgical history, family history,  social history, and previous encounter notes.   I, Burt Knack, am acting as transcriptionist for Quillian Quince, MD.  I have reviewed the above documentation for accuracy and completeness, and I agree with the above. -  Quillian Quince, MD

## 2023-07-19 ENCOUNTER — Telehealth (INDEPENDENT_AMBULATORY_CARE_PROVIDER_SITE_OTHER): Payer: 59 | Admitting: Psychology

## 2023-07-19 DIAGNOSIS — F5089 Other specified eating disorder: Secondary | ICD-10-CM | POA: Diagnosis not present

## 2023-07-19 DIAGNOSIS — F4323 Adjustment disorder with mixed anxiety and depressed mood: Secondary | ICD-10-CM | POA: Diagnosis not present

## 2023-07-19 NOTE — Progress Notes (Signed)
  Office: [phone]  /  Fax: [phone]    Date: July 19, 2023  Appointment Start Time: 4:34pm Duration: 31 minutes Provider: Lawerance Cruel, Psy.D. Type of Session: Individual Therapy  Location of Patient: Parked in car at work (address obtained; safe/private location) Location of Provider: Provider's Home (private office) Type of Contact: Telepsychological Visit via MyChart Video Visit  Session Content: Kenneth Green is a 57 y.o. male presenting for a follow-up appointment to address the previously established treatment goal of increasing coping skills.Today's appointment was a telepsychological visit. Kenneth Green provided verbal consent for today's telepsychological appointment and he is aware he is responsible for securing confidentiality on his end of the session. Prior to proceeding with today's appointment, Kenneth Green's physical location at the time of this appointment was obtained as well a phone number he could be reached at in the event of technical difficulties. Kenneth Green and this provider participated in today's telepsychological service.   This provider conducted a brief check-in. Kenneth Green reported, "I'm hanging in there" and shared about recent health-related stressors. He acknowledged the chronic pain is impacting his eating habits, adding eating is a "source of comfort." Kenneth Green was engaged in problem solving to develop a plan to help cope with urges/cravings involving activities to relax, activities to distract, comforting places, people to call and connect with, and activities that help soothe senses. He was observed writing the plan. Overall, [name] was receptive to today's appointment as evidenced by openness to sharing, responsiveness to feedback, and willingness to implement discussed strategies .  Mental Status Examination:  Appearance: neat Behavior: appropriate to circumstances Mood: depressed Affect: mood congruent Speech: WNL Eye Contact: appropriate Psychomotor Activity:  WNL Gait: unable to assess Thought Process: linear, logical, and goal directed and no evidence or endorsement of suicidal, homicidal, and self-harm ideation, plan and intent  Thought Content/Perception: no hallucinations, delusions, bizarre thinking or behavior endorsed or observed Orientation: AAOx4 Memory/Concentration: intact Insight: fair Judgment: fair  Interventions:  Conducted a brief chart review Provided empathic reflections and validation Employed supportive psychotherapy interventions to facilitate reduced distress and to improve coping skills with identified stressors Engaged patient in problem solving  DSM-5 Diagnosis(es):  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 Kenneth Green has demonstrated progress in his goal as evidenced by increased awareness of hunger patterns. Kenneth Green also continues to demonstrate willingness to engage in learned skill(s).  Plan: The next appointment is scheduled for 08/15/2023 at 2:30pm, which will be via MyChart Video Visit. The next session will focus on working towards the established treatment goal. Kenneth Green will continue with his primary therapist.

## 2023-07-27 ENCOUNTER — Ambulatory Visit: Payer: 59 | Admitting: Psychology

## 2023-07-27 DIAGNOSIS — F4323 Adjustment disorder with mixed anxiety and depressed mood: Secondary | ICD-10-CM | POA: Diagnosis not present

## 2023-07-27 NOTE — Progress Notes (Signed)
[facility] Counselor/Therapist Progress Note  Patient ID: RICHARDO SPURBECK, MRN: [id number],    Date: 07/27/2023  Time Spent: 45 mins  start time: 1400   end time 1445  Treatment Type: Individual Therapy  Reported Symptoms: Pt presented via Caregility video for follow up session, stating his understanding of the limits of virtual visits and granting consent for the session.  Pt stated he was in his home and no one else was present.  I shared with pt that I am in my office and no one else is present.  Mental Status Exam: Appearance:  Casual     Behavior: Appropriate  Motor: Normal  Speech/Language:  Clear and Coherent  Affect: Appropriate  Mood: normal  Thought process: normal  Thought content:   WNL  Sensory/Perceptual disturbances:   WNL  Orientation: oriented to person, place, and time/date  Attention: Good  Concentration: Good  Memory: WNL  Fund of knowledge:  Good  Insight:   Good  Judgment:  Good  Impulse Control: Good   Risk Assessment: Danger to Self:  No Self-injurious Behavior: No Danger to Others: No Duty to Warn:no Physical Aggression / Violence:No  Access to Firearms a concern: No  Gang Involvement:No   Subjective: Pt shares that "my grandson was born 8/31 and everyone is happy and healthy.  We also just celebrated my grand daughter's second birthday.  I am having back surgery on 9/24; the same surgeon who did his first back surgery several years ago and he trusts him very well.  His daughter is doing well in her teaching this year and enjoys her new school system ([employer]-8th grade).  Pt is going to see a hand specialist next Monday for a possible injection in his finger; it is still bothering him.  Pt shares that [name]'s eye is healthy but she still cannot see out of it; she will likely need a cornea transplant in November; they will remove her cataract then as well.  She continues to have back pain too so she is going to be seen by pt's back surgeon to  see if he can help her.  Pt shares that work is going pretty well day to day.  Pt shares that he is working with Dr. Dewaine Conger at [facility] "on the emotional eating thing; when I get stressed, I tend to eat more and so on."  Pt shares that he has been trying to interrupt his negative thinking when it comes to his food choices; Dr. Dewaine Conger has been helping him with that as well.  Pt shares that he is making an attempt at a "dry" September and 4 days in he is doing well.  He has read some of his AA book recently as well.  He went to a re-enactment last weekend; he spent some time with his friend, Rosanne Ashing, who is going through cancer treatment.  Encouraged pt to continue with his self care activities and we will meet in 4 wks for a follow up session, after his surgery on 9/24.  Interventions: Cognitive Behavioral Therapy  Diagnosis:Adjustment disorder with mixed anxiety and depressed mood  Plan: Treatment Plan Strengths/Abilities:  Intelligent, Intuitive, Willing to participate in therapy Treatment Preferences:  Outpatient Individual Therapy Statement of Needs:  Patient is to use CBT, mindfulness and coping skills to help manage and/or decrease symptoms associated with their diagnosis. Symptoms:  Depressed/Irritable mood, worry, social withdrawal Problems Addressed:  Depressive thoughts, Sadness, Sleep issues, etc. Long Term Goals:  Pt to reduce overall level, frequency, and  intensity of the feelings of depression as evidenced by decreased irritability, negative self talk, and helpless feelings from 6 to 7 days/week to 0 to 1 days/week, per client report, for at least 3 consecutive months.  Progress: 20% Short Term Goals:  Pt to verbally express understanding of the relationship between feelings of depression and their impact on thinking patterns and behaviors.  Pt to verbalize an understanding of the role that distorted thinking plays in creating fears, excessive worry, and ruminations.  Progress: 20% Target Date:   11/18/2023 Frequency:  Bi-weekly Modality:  Cognitive Behavioral Therapy Interventions by Therapist:  Therapist will use CBT, Mindfulness exercises, Coping skills and Referrals, as needed by client. Client has verbally approved this treatment plan.  Karie Kirks, [facility]

## 2023-07-31 ENCOUNTER — Other Ambulatory Visit (INDEPENDENT_AMBULATORY_CARE_PROVIDER_SITE_OTHER): Payer: Self-pay | Admitting: Family Medicine

## 2023-07-31 DIAGNOSIS — E559 Vitamin D deficiency, unspecified: Secondary | ICD-10-CM

## 2023-08-09 ENCOUNTER — Ambulatory Visit (INDEPENDENT_AMBULATORY_CARE_PROVIDER_SITE_OTHER): Payer: 59 | Admitting: Family Medicine

## 2023-08-09 ENCOUNTER — Encounter (INDEPENDENT_AMBULATORY_CARE_PROVIDER_SITE_OTHER): Payer: Self-pay | Admitting: Family Medicine

## 2023-08-09 ENCOUNTER — Other Ambulatory Visit (HOSPITAL_COMMUNITY): Payer: Self-pay

## 2023-08-09 VITALS — BP 129/85 | HR 71 | Temp 98.4°F | Ht 69.0 in | Wt 301.0 lb

## 2023-08-09 DIAGNOSIS — Z6841 Body Mass Index (BMI) 40.0 and over, adult: Secondary | ICD-10-CM

## 2023-08-09 DIAGNOSIS — E1169 Type 2 diabetes mellitus with other specified complication: Secondary | ICD-10-CM | POA: Diagnosis not present

## 2023-08-09 DIAGNOSIS — Z7985 Long-term (current) use of injectable non-insulin antidiabetic drugs: Secondary | ICD-10-CM

## 2023-08-09 DIAGNOSIS — E669 Obesity, unspecified: Secondary | ICD-10-CM

## 2023-08-09 DIAGNOSIS — E559 Vitamin D deficiency, unspecified: Secondary | ICD-10-CM | POA: Diagnosis not present

## 2023-08-09 MED ORDER — VITAMIN D (ERGOCALCIFEROL) 1.25 MG (50000 UNIT) PO CAPS
50000.0000 [IU] | ORAL_CAPSULE | ORAL | 1 refills | Status: DC
Start: 2023-08-09 — End: 2023-09-06
  Filled 2023-08-09: qty 4, 28d supply, fill #0

## 2023-08-09 MED ORDER — TIRZEPATIDE 5 MG/0.5ML ~~LOC~~ SOAJ
5.0000 mg | SUBCUTANEOUS | 0 refills | Status: DC
Start: 2023-08-09 — End: 2023-09-06
  Filled 2023-08-09: qty 2, 28d supply, fill #0

## 2023-08-10 NOTE — Progress Notes (Unsigned)
Chief Complaint:   OBESITY Kenneth Green is here to discuss his progress with his obesity treatment plan along with follow-up of his obesity related diagnoses. [name] is on the Category 4 Plan and keeping a food journal and adhering to recommended goals of 500-600 calories and 40+ grams of protein at supper and states he is following his eating plan approximately 40% of the time. [name] states he is walking for 45 minutes 5 times per week.  Today's visit was #: 59 Starting weight: 308 lbs Starting date: 04/10/2019 Today's weight: 301 lbs Today's date: 08/09/2023 Total lbs lost to date: 7 Total lbs lost since last in-office visit: 0  Interim History: Patient is getting ready to have another back surgery.  He does most of the meal planning, but this has decreased while he concentrates on other health issues.  Subjective:   1. Type 2 diabetes mellitus with other specified complication, unspecified whether long term insulin use (HCC) Patient has been on Mounjaro for 4 doses at 2.5 mg with no side effects noted.  He has to hold his next dose before surgery.  2. Vitamin D deficiency Patient is on vitamin D with no side effects noted, and he is due for labs soon.  Assessment/Plan:   1. Type 2 diabetes mellitus with other specified complication, unspecified whether long term insulin use (HCC) Patient agreed to increase Mounjaro to 5 mg once weekly, and we will refill for 1 month.  Patient is to start on the 5 mg dose after his back surgery.  We will recheck labs next month.  - tirzepatide [facility]) 5 MG/0.5ML Pen; Inject 5 mg into the skin once a week.  Dispense: 2 mL; Refill: 0  2. Vitamin D deficiency Patient will continue prescription vitamin D once weekly, and we will refill for 2 months.  We will recheck labs next month.  - Vitamin D, Ergocalciferol, (DRISDOL) 1.25 MG (50000 UNIT) CAPS capsule; Take 1 capsule by mouth every 7 days.  Dispense: 4 capsule; Refill: 1  3. BMI 40.0-44.9,  adult (HCC)  4. Obesity, Beginning BMI 45.48 [name] is currently in the action stage of change. As such, his goal is to continue with weight loss efforts. He has agreed to the Category 3 Plan.   Exercise goals: As is.   Behavioral modification strategies: meal planning and cooking strategies.  Philippos has agreed to follow-up with our clinic in 4 weeks. He was informed of the importance of frequent follow-up visits to maximize his success with intensive lifestyle modifications for his multiple health conditions.   Objective:   Blood pressure 129/85, pulse 71, temperature 98.4 F (36.9 C), height 5\' 9"  (1.753 m), weight (!) 301 lb (136.5 kg), SpO2 98%. Body mass index is 44.45 kg/m.  Lab Results  Component Value Date   CREATININE 1.1 05/24/2023   BUN 15 05/24/2023   NA 138 05/24/2023   K 4.1 05/24/2023   CL 102 05/24/2023   CO2 30 (A) 05/24/2023   Lab Results  Component Value Date   ALT 28 05/24/2023   AST 23 05/24/2023   ALKPHOS 62 02/22/2023   BILITOT 0.3 02/22/2023   Lab Results  Component Value Date   HGBA1C 6.0 (H) 02/22/2023   HGBA1C 5.4 09/13/2022   HGBA1C 5.4 04/26/2022   HGBA1C 5.4 01/19/2022   HGBA1C 5.4 11/11/2021   Lab Results  Component Value Date   INSULIN 39.6 (H) 02/22/2023   INSULIN 10.2 09/13/2022   INSULIN 23.5 04/26/2022   INSULIN 20.5  11/11/2021   INSULIN 12.8 01/15/2021   Lab Results  Component Value Date   TSH 1.020 02/22/2023   Lab Results  Component Value Date   CHOL 177 05/24/2023   HDL 44 05/24/2023   LDLCALC 110 05/24/2023   TRIG 124 05/24/2023   Lab Results  Component Value Date   VD25OH 33.6 02/22/2023   VD25OH 31.4 01/05/2023   VD25OH 41.0 09/13/2022   Lab Results  Component Value Date   WBC 5.1 02/22/2023   HGB 14.2 02/22/2023   HCT 43.3 02/22/2023   MCV 86 02/22/2023   PLT 205 02/22/2023   No results found for: "IRON", "TIBC", "FERRITIN"  Attestation Statements:   Reviewed by clinician on day of visit:  allergies, medications, problem list, medical history, surgical history, family history, social history, and previous encounter notes.  I have personally spent 40 minutes total time today in preparation, patient care, and documentation for this visit, including the following: review of clinical lab tests; review of medical tests/procedures/services.   I, Burt Knack, am acting as transcriptionist for Quillian Quince, MD.  I have reviewed the above documentation for accuracy and completeness, and I agree with the above. -  Quillian Quince, MD

## 2023-08-13 ENCOUNTER — Other Ambulatory Visit: Payer: Self-pay | Admitting: Behavioral Health

## 2023-08-13 DIAGNOSIS — F411 Generalized anxiety disorder: Secondary | ICD-10-CM

## 2023-08-13 DIAGNOSIS — F331 Major depressive disorder, recurrent, moderate: Secondary | ICD-10-CM

## 2023-08-15 ENCOUNTER — Telehealth (INDEPENDENT_AMBULATORY_CARE_PROVIDER_SITE_OTHER): Payer: 59 | Admitting: Psychology

## 2023-08-15 DIAGNOSIS — F4323 Adjustment disorder with mixed anxiety and depressed mood: Secondary | ICD-10-CM

## 2023-08-15 DIAGNOSIS — F5089 Other specified eating disorder: Secondary | ICD-10-CM

## 2023-08-15 NOTE — Progress Notes (Signed)
  Office: [phone]  /  Fax: [phone]    Date: August 15, 2023  Appointment Start Time: 2:32pm Duration: 23 minutes Provider: Lawerance Cruel, Psy.D. Type of Session: Individual Therapy  Location of Patient: Home (private location) Location of Provider: Provider's Home (private office) Type of Contact: Telepsychological Visit via MyChart Video Visit  Session Content: Kenneth Green is a 57 y.o. male presenting for a follow-up appointment to address the previously established treatment goal of increasing coping skills.Today's appointment was a telepsychological visit. Kenneth Green provided verbal consent for today's telepsychological appointment and he is aware he is responsible for securing confidentiality on his end of the session. Prior to proceeding with today's appointment, Kenneth Green's physical location at the time of this appointment was obtained as well a phone number he could be reached at in the event of technical difficulties. Kenneth Green and this provider participated in today's telepsychological service.   This provider conducted a brief check-in. Kenneth Green stated he is having back surgery tomorrow. Associated thoughts/feelings processed. He shared concern regarding his eating habits while recovering as church members will bring food to his home. He was engaged in problem solving to help him make better choices and engage in portion control. Kenneth Green agreed to the implement the following: pre-portion meals; purchase frozen microwave vegetables; and pre-portion protein snacks. He was also encouraged to review his previously developed plan to assist with emotional eating behaviors. Overall, Kenneth Green was receptive to today's appointment as evidenced by openness to sharing, responsiveness to feedback, and willingness to implement discussed strategies .  Mental Status Examination:  Appearance: neat Behavior: appropriate to circumstances Mood: anxious Affect: mood congruent Speech: WNL Eye Contact:  appropriate Psychomotor Activity: WNL Gait: unable to assess Thought Process: linear, logical, and goal directed and no evidence or endorsement of suicidal, homicidal, and self-harm ideation, plan and intent  Thought Content/Perception: no hallucinations, delusions, bizarre thinking or behavior endorsed or observed Orientation: AAOx4 Memory/Concentration: intact Insight: fair Judgment: fair  Interventions:  Conducted a brief chart review Provided empathic reflections and validation Employed supportive psychotherapy interventions to facilitate reduced distress and to improve coping skills with identified stressors Engaged patient in problem solving  DSM-5 Diagnosis(es):  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 Kenneth Green has demonstrated progress in his goal as evidenced by increased awareness of hunger patterns. Kenneth Green also continues to demonstrate willingness to engage in learned skill(s).  Plan: Due to Kenneth Green's upcoming surgery, the next appointment is scheduled for 09/12/2023 at 2:30pm, which will be via MyChart Video Visit. The next session will focus on working towards the established treatment goal. Kenneth Green will continue with his primary therapist.

## 2023-08-16 ENCOUNTER — Other Ambulatory Visit (HOSPITAL_COMMUNITY): Payer: Self-pay

## 2023-08-18 ENCOUNTER — Encounter (INDEPENDENT_AMBULATORY_CARE_PROVIDER_SITE_OTHER): Payer: Self-pay | Admitting: Family Medicine

## 2023-08-18 ENCOUNTER — Other Ambulatory Visit (HOSPITAL_COMMUNITY): Payer: Self-pay

## 2023-08-24 ENCOUNTER — Ambulatory Visit: Payer: 59 | Admitting: Psychology

## 2023-08-24 DIAGNOSIS — F4323 Adjustment disorder with mixed anxiety and depressed mood: Secondary | ICD-10-CM

## 2023-08-24 NOTE — Progress Notes (Signed)
[facility] Counselor/Therapist Progress Note  Patient ID: RAMONA SLINGER, MRN: [id number],    Date: 08/24/2023  Time Spent: 45 mins  start time: 1400   end time 1445  Treatment Type: Individual Therapy  Reported Symptoms: Pt presented via Caregility video for follow up session, stating his understanding of the limits of virtual visits and granting consent for the session.  Pt stated he was in his home and no one else was present.  I shared with pt that I am in my office and no one else is present.  Mental Status Exam: Appearance:  Casual     Behavior: Appropriate  Motor: Normal  Speech/Language:  Clear and Coherent  Affect: Appropriate  Mood: normal  Thought process: normal  Thought content:   WNL  Sensory/Perceptual disturbances:   WNL  Orientation: oriented to person, place, and time/date  Attention: Good  Concentration: Good  Memory: WNL  Fund of knowledge:  Good  Insight:   Good  Judgment:  Good  Impulse Control: Good   Risk Assessment: Danger to Self:  No Self-injurious Behavior: No Danger to Others: No Duty to Warn:no Physical Aggression / Violence:No  Access to Firearms a concern: No  Gang Involvement:No   Subjective: Pt shares that "I am feeling much better; I had surgery on my back last week and I am feeling much better already.  I will probably be out of work for the next 5-6 weeks; I see the surgeon for the first follow up in 3 more weeks."  Pt shares that he has been sleeping well since his procedure.  [place], but the are all safe; just now power, water, etc right now.  Pt shares that her daughter and her family (grand daughter and Holland Falling 8/31).  Pt shares that [name] is needing help with her back; possibly a spinal cord stimulator; she is also needing a cornea transplant and also has significant shoulder pain as well; the eye surgery is likely next.  Pt shares that they  have had lots of help with food, etc through the church and he really appreciates that help.  Pt shares that his mom has an infection in her foot that may require surgery.  [name] has been very helpful to them as well; she is enjoying her new school and teaching assignment.  Pt continues to see staff at [facility]; he continues to work with Dr. Dewaine Conger on the emotional eating aspect of their program.  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  He has managed his cravings very well so far.  Pt has been reading some more of the AA Big Book and has found value in what he is reading.  Encouraged pt to continue with his self care activities and we will meet in 4 wks for a follow up session.  Interventions: Cognitive Behavioral Therapy  Diagnosis:Adjustment disorder with mixed anxiety and depressed mood  Plan: Treatment Plan Strengths/Abilities:  Intelligent, Intuitive, Willing to participate in therapy Treatment Preferences:  Outpatient Individual Therapy Statement of Needs:  Patient is to use CBT, mindfulness and coping skills to help manage and/or decrease symptoms associated with their diagnosis. Symptoms:  Depressed/Irritable mood, worry, social withdrawal Problems Addressed:  Depressive thoughts, Sadness, Sleep issues, etc. Long Term Goals:  Pt to reduce overall level, frequency, and intensity of the feelings of depression as evidenced by decreased irritability, negative self talk, and  helpless feelings from 6 to 7 days/week to 0 to 1 days/week, per client report, for at least 3 consecutive months.  Progress: 20% Short Term Goals:  Pt to verbally express understanding of the relationship between feelings of depression and their impact on thinking patterns and behaviors.  Pt to verbalize an understanding of the role that distorted thinking plays in creating fears, excessive worry, and ruminations.  Progress: 20% Target Date:  11/18/2023 Frequency:  Bi-weekly Modality:   Cognitive Behavioral Therapy Interventions by Therapist:  Therapist will use CBT, Mindfulness exercises, Coping skills and Referrals, as needed by client. Client has verbally approved this treatment plan.  Karie Kirks, [facility]

## 2023-09-06 ENCOUNTER — Other Ambulatory Visit (HOSPITAL_COMMUNITY): Payer: Self-pay

## 2023-09-06 ENCOUNTER — Ambulatory Visit (INDEPENDENT_AMBULATORY_CARE_PROVIDER_SITE_OTHER): Payer: 59 | Admitting: Family Medicine

## 2023-09-06 ENCOUNTER — Encounter (INDEPENDENT_AMBULATORY_CARE_PROVIDER_SITE_OTHER): Payer: Self-pay | Admitting: Family Medicine

## 2023-09-06 VITALS — BP 132/84 | HR 78 | Temp 98.0°F | Ht 69.0 in | Wt 295.0 lb

## 2023-09-06 DIAGNOSIS — E559 Vitamin D deficiency, unspecified: Secondary | ICD-10-CM | POA: Diagnosis not present

## 2023-09-06 DIAGNOSIS — E1169 Type 2 diabetes mellitus with other specified complication: Secondary | ICD-10-CM

## 2023-09-06 DIAGNOSIS — Z6841 Body Mass Index (BMI) 40.0 and over, adult: Secondary | ICD-10-CM | POA: Diagnosis not present

## 2023-09-06 DIAGNOSIS — E669 Obesity, unspecified: Secondary | ICD-10-CM

## 2023-09-06 DIAGNOSIS — Z7985 Long-term (current) use of injectable non-insulin antidiabetic drugs: Secondary | ICD-10-CM

## 2023-09-06 MED ORDER — VITAMIN D (ERGOCALCIFEROL) 1.25 MG (50000 UNIT) PO CAPS
50000.0000 [IU] | ORAL_CAPSULE | ORAL | 1 refills | Status: DC
Start: 2023-09-06 — End: 2023-11-07
  Filled 2023-09-06: qty 4, 28d supply, fill #0
  Filled 2023-10-10: qty 4, 28d supply, fill #1

## 2023-09-06 MED ORDER — TIRZEPATIDE 5 MG/0.5ML ~~LOC~~ SOAJ
5.0000 mg | SUBCUTANEOUS | 0 refills | Status: DC
Start: 2023-09-06 — End: 2023-11-07
  Filled 2023-09-06: qty 2, 28d supply, fill #0

## 2023-09-06 NOTE — Progress Notes (Signed)
Kenneth Green, D.O.  ABFM, ABOM Specializing in Clinical Bariatric Medicine  Office located at: [address] [place], [place]  [address]     Assessment and Plan:   Medications Discontinued During This Encounter  Medication Reason   Vitamin D, Ergocalciferol, (DRISDOL) 1.25 MG (50000 UNIT) CAPS capsule Reorder   tirzepatide Greggory Keen) 5 MG/0.5ML Pen Reorder     Meds ordered this encounter  Medications   tirzepatide (MOUNJARO) 5 MG/0.5ML Pen    Sig: Inject 5 mg into the skin once a week.    Dispense:  2 mL    Refill:  0   Vitamin D, Ergocalciferol, (DRISDOL) 1.25 MG (50000 UNIT) CAPS capsule    Sig: Take 1 capsule (50,000 Units total) by mouth every 7 (seven) days.    Dispense:  4 capsule    Refill:  1     Type 2 diabetes mellitus with other specified complication, unspecified whether long term insulin use (HCC) Assessment & Plan: Highest Hemoglobin A1c was 6.0 on 03/16/23. Pt reports that his most recent A1c with [facility] was 5.7 in June. His fasting blood sugars have been stable at 100-115. Pt was told to hold off on Mounjaro prior to his back surgery. Dr.Beasley increased Mounjaro to 5 mg at last OV and pt started this dose last week - he is tolerating it well, denies any GI upset. Will refill Mounjaro today - no dose change. Continue with weight loss therapy.    Vitamin D deficiency Assessment & Plan: Vitamin D levels were sub-optimal at 33.6 on 02/22/23. Pt currently on ERGO 50,000 units every 7 days without any adverse SE. Will refill ERGO today. Check vitamin D with Dr.Beasley in the near future.     BMI 40.0-44.9, adult (HCC) - current BMI 43.54 Obesity, Beginning BMI 45.48 Assessment & Plan: Since last office visit on 08/09/23 patient's muscle mass has increased by 0.2 lb. Fat mass has decreased by 5.6 lb. Total body water has decreased by 5.8 lb.  Counseling done on how various foods will affect these numbers and how to maximize success  Total lbs lost to  date: 13 lbs  Total weight loss percentage to date: 4.22%   No change to meal plan - see Subjective  Behavioral Intervention Additional resources provided today:  n/a Evidence-based interventions for health behavior change were utilized today including the discussion of self monitoring techniques, problem-solving barriers and SMART goal setting techniques.   Regarding patient's less desirable eating habits and patterns, we employed the technique of small changes.  Pt will specifically work on: n/a  FOLLOW UP: Return in about 4 weeks (around 10/04/2023). He was informed of the importance of frequent follow up visits to maximize his success with intensive lifestyle modifications for his multiple health conditions.  Subjective:   Chief complaint: Obesity Kenneth Green is here to discuss his progress with his obesity treatment plan. He is on the Category 3 Plan and states he is following his eating plan approximately 25% of the time. He states he is walking  20 minutes 7 days per week.  Interval History:  RAINIER Green is a pleasant 57 y.o male affected by obesity and is accompanied by his wife.This is my first time meeting Yamin; Rebel is managed and treated by [facility]. He is recovering well from spinal stenosis of lumbar region. Pt endorses that his friends/family have been bringing a lot of "off-plan foods" to their house since his back surgery. He does report trying to work on  portion control.  Pharmacotherapy for weight loss: He is currently taking  Wellbutrin SR 200 mg bid & Mounjaro 5 mg once a week   for medical weight loss.  Denies side effects.    Review of Systems:  Pertinent positives were addressed with patient today.  Reviewed by clinician on day of visit: allergies, medications, problem list, medical history, surgical history, family history, social history, and previous encounter notes.  Weight Summary and Biometrics   Weight Lost Since Last Visit: 6lb  Weight Gained  Since Last Visit: 0lb   Vitals Temp: 98 F (36.7 C) BP: 132/84 Pulse Rate: 78 SpO2: 97 %   Anthropometric Measurements Height: 5\' 9"  (1.753 m) Weight: 295 lb (133.8 kg) BMI (Calculated): 43.54 Weight at Last Visit: 301lb Weight Lost Since Last Visit: 6lb Weight Gained Since Last Visit: 0lb Starting Weight: 308lb Total Weight Loss (lbs): 13 lb (5.897 kg)   Body Composition  Body Fat %: 37.9 % Fat Mass (lbs): 112 lbs Muscle Mass (lbs): 174.6 lbs Total Body Water (lbs): 123.8 lbs Visceral Fat Rating : 26   Other Clinical Data Fasting: no Labs: no Today's Visit #: 62 Starting Date: 04/10/19   Objective:   PHYSICAL EXAM: Blood pressure 132/84, pulse 78, temperature 98 F (36.7 C), height 5\' 9"  (1.753 m), weight 295 lb (133.8 kg), SpO2 97%. Body mass index is 43.56 kg/m.  General: Well Developed, well nourished, and in no acute distress.  HEENT: Normocephalic, atraumatic Skin: Warm and dry, cap RF less 2 sec, good turgor Chest:  Normal excursion, shape, no gross abn Respiratory: speaking in full sentences, no conversational dyspnea NeuroM-Sk: Ambulates w/o assistance, moves * 4 Psych: A and O *3, insight good, mood-full  DIAGNOSTIC DATA REVIEWED:  BMET    Component Value Date/Time   NA 138 05/24/2023 0000   K 4.1 05/24/2023 0000   CL 102 05/24/2023 0000   CO2 30 (A) 05/24/2023 0000   GLUCOSE 105 (H) 02/22/2023 1540   GLUCOSE 106 (H) 06/15/2016 1700   BUN 15 05/24/2023 0000   CREATININE 1.1 05/24/2023 0000   CREATININE 1.08 02/22/2023 1540   CALCIUM 9.7 05/24/2023 0000   GFRNONAA 74 05/11/2021 0000   GFRAA 91 01/15/2021 1322   Lab Results  Component Value Date   HGBA1C 6.0 (H) 02/22/2023   HGBA1C 5.9 (H) 04/10/2019   Lab Results  Component Value Date   INSULIN 39.6 (H) 02/22/2023   INSULIN 30.9 (H) 04/10/2019   Lab Results  Component Value Date   TSH 1.020 02/22/2023   CBC    Component Value Date/Time   WBC 5.1 02/22/2023 1540   WBC 7.7  06/15/2016 1700   RBC 5.03 02/22/2023 1540   RBC 4.87 06/15/2016 1700   HGB 14.2 02/22/2023 1540   HCT 43.3 02/22/2023 1540   PLT 205 02/22/2023 1540   MCV 86 02/22/2023 1540   MCH 28.2 02/22/2023 1540   MCH 29.0 06/15/2016 1700   MCHC 32.8 02/22/2023 1540   MCHC 34.6 06/15/2016 1700   RDW 13.3 02/22/2023 1540   Iron Studies No results found for: "IRON", "TIBC", "FERRITIN", "IRONPCTSAT" Lipid Panel     Component Value Date/Time   CHOL 177 05/24/2023 0000   CHOL 200 (H) 02/22/2023 1540   TRIG 124 05/24/2023 0000   HDL 44 05/24/2023 0000   HDL 53 02/22/2023 1540   LDLCALC 110 05/24/2023 0000   LDLCALC 131 (H) 02/22/2023 1540   Hepatic Function Panel     Component Value Date/Time  PROT 6.8 02/22/2023 1540   ALBUMIN 4.3 05/24/2023 0000   ALBUMIN 4.4 02/22/2023 1540   AST 23 05/24/2023 0000   ALT 28 05/24/2023 0000   ALKPHOS 62 02/22/2023 1540   BILITOT 0.3 02/22/2023 1540      Component Value Date/Time   TSH 1.020 02/22/2023 1540   Nutritional Lab Results  Component Value Date   VD25OH 33.6 02/22/2023   VD25OH 31.4 01/05/2023   VD25OH 41.0 09/13/2022    Attestations:   I, Special Puri, acting as a Stage manager for [employer], DO., have compiled all relevant documentation for today's office visit on behalf of Thomasene Lot, DO, while in the presence of [employer], DO.  I have reviewed the above documentation for accuracy and completeness, and I agree with the above. Kenneth Green, D.O.  The 21st Century Cures Act was signed into law in 2016 which includes the topic of electronic health records.  This provides immediate access to information in MyChart.  This includes consultation notes, operative notes, office notes, lab results and pathology reports.  If you have any questions about what you read please let us know at your next visit so we can discuss your concerns and take corrective action if need be.  We are right here with you.

## 2023-09-08 ENCOUNTER — Other Ambulatory Visit (HOSPITAL_COMMUNITY): Payer: Self-pay

## 2023-09-12 ENCOUNTER — Telehealth (INDEPENDENT_AMBULATORY_CARE_PROVIDER_SITE_OTHER): Payer: 59 | Admitting: Psychology

## 2023-09-12 DIAGNOSIS — F5089 Other specified eating disorder: Secondary | ICD-10-CM

## 2023-09-12 DIAGNOSIS — F4323 Adjustment disorder with mixed anxiety and depressed mood: Secondary | ICD-10-CM

## 2023-09-12 NOTE — Progress Notes (Signed)
  Office: [phone]  /  Fax: [phone]    Date: September 12, 2023  Appointment Start Time: 2:24pm Duration: 25 minutes Provider: Lawerance Cruel, Psy.D. Type of Session: Individual Therapy  Location of Patient: Home (private location) Location of Provider: Provider's Home (private office) Type of Contact: Telepsychological Visit via MyChart Video Visit  Session Content: Kenneth Green is a 57 y.o. male presenting for a follow-up appointment to address the previously established treatment goal of increasing coping skills.Today's appointment was a telepsychological visit. Kenneth Green provided verbal consent for today's telepsychological appointment and he is aware he is responsible for securing confidentiality on his end of the session. Prior to proceeding with today's appointment, Kenneth Green's physical location at the time of this appointment was obtained as well a phone number he could be reached at in the event of technical difficulties. Kenneth Green and this provider participated in today's telepsychological service.   This provider conducted a brief check-in. Kenneth Green reported his surgery "went really well." While recovering, he discussed staying busy to avoid engagement in emotional eating behaviors. He further shared progress as it relates to his journey with the clinic, noting he is losing weight. Discussed what is going well and what he can translate to his day to day routine once he is back to work. [name] was observed making notes for future reference. Overall, [name] was receptive to today's appointment as evidenced by openness to sharing, responsiveness to feedback, and willingness to continue engaging in learned skills.  Mental Status Examination:  Appearance: neat Behavior: appropriate to circumstances Mood: neutral Affect: mood congruent Speech: WNL Eye Contact: appropriate Psychomotor Activity: WNL Gait: unable to assess Thought Process: linear, logical, and goal directed and no evidence or  endorsement of suicidal, homicidal, and self-harm ideation, plan and intent  Thought Content/Perception: no hallucinations, delusions, bizarre thinking or behavior endorsed or observed Orientation: AAOx4 Memory/Concentration: intact Insight: good Judgment: good  Interventions:  Conducted a brief chart review Provided empathic reflections and validation Reviewed content from the previous session Provided positive reinforcement Employed supportive psychotherapy interventions to facilitate reduced distress and to improve coping skills with identified stressors Engaged patient in problem solving  DSM-5 Diagnosis(es):  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 [name] has demonstrated progress in his goal as evidenced by increased awareness of hunger patterns and reduction in emotional eating behaviors . [name] also continues to demonstrate willingness to engage in learned skill(s).  Plan: The next appointment is scheduled for 09/27/2023 at 11:30am, which will be via MyChart Video Visit. The next session will focus on working towards the established treatment goal. [name] will continue with his primary therapist.

## 2023-09-27 ENCOUNTER — Telehealth (INDEPENDENT_AMBULATORY_CARE_PROVIDER_SITE_OTHER): Payer: 59 | Admitting: Psychology

## 2023-09-27 DIAGNOSIS — F4323 Adjustment disorder with mixed anxiety and depressed mood: Secondary | ICD-10-CM | POA: Diagnosis not present

## 2023-09-27 DIAGNOSIS — F5089 Other specified eating disorder: Secondary | ICD-10-CM

## 2023-09-27 NOTE — Progress Notes (Signed)
  Office: [phone]  /  Fax: [phone]    Date: September 27, 2023  Appointment Start Time: 11:01am Duration: 24 minutes Provider: Lawerance Cruel, Psy.D. Type of Session: Individual Therapy  Location of Patient: Home (private location) Location of Provider: Provider's Home (private office) Type of Contact: Telepsychological Visit via MyChart Video Visit  Session Content: Kenneth Green is a 57 y.o. male presenting for a follow-up appointment to address the previously established treatment goal of increasing coping skills.Today's appointment was a telepsychological visit. Kenneth Green provided verbal consent for today's telepsychological appointment and he is aware he is responsible for securing confidentiality on his end of the session. Prior to proceeding with today's appointment, Kenneth Green's physical location at the time of this appointment was obtained as well a phone number he could be reached at in the event of technical difficulties. Kenneth Green and this provider participated in today's telepsychological service.   This provider conducted a brief check-in. Kenneth Green stated he is focusing on recovery. He expressed concern regarding returning to work as it relates to eating habits. Further explored. He was engaged in problem solving. Additionally, reviewed learned skills to date to assist with emotional eating behaviors and stress management. Overall, Kenneth Green was receptive to today's appointment as evidenced by openness to sharing, responsiveness to feedback, and willingness to implement discussed strategies .  Mental Status Examination:  Appearance: neat Behavior: appropriate to circumstances Mood: neutral Affect: mood congruent Speech: WNL Eye Contact: appropriate Psychomotor Activity: WNL Gait: unable to assess Thought Process: linear, logical, and goal directed and no evidence or endorsement of suicidal, homicidal, and self-harm ideation, plan and intent  Thought Content/Perception: no  hallucinations, delusions, bizarre thinking or behavior endorsed or observed Orientation: AAOx4 Memory/Concentration: intact Insight: good Judgment: good  Interventions:  Conducted a brief chart review Provided empathic reflections and validation Reviewed content from the previous session Provided positive reinforcement Employed supportive psychotherapy interventions to facilitate reduced distress and to improve coping skills with identified stressors Engaged patient in problem solving  DSM-5 Diagnosis(es):  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 Kenneth Green has demonstrated progress in his goal as evidenced by increased awareness of hunger patterns, increased awareness of triggers for emotional eating behaviors, and reduction in emotional eating behaviors . Kenneth Green also continues to demonstrate willingness to engage in learned skill(s).  Plan: The next appointment is scheduled for 10/24/2023 at 2:30pm, which will be via MyChart Video Visit. The next session will focus on working towards the established treatment goal. Kenneth Green will continue with his primary therapist.

## 2023-09-28 ENCOUNTER — Ambulatory Visit: Payer: 59 | Admitting: Psychology

## 2023-09-28 DIAGNOSIS — F4323 Adjustment disorder with mixed anxiety and depressed mood: Secondary | ICD-10-CM

## 2023-09-28 NOTE — Progress Notes (Signed)
[facility] Counselor/Therapist Progress Note  [name], MRN: [id number],    Date: 09/28/2023  Time Spent: 60 mins  start time: 1400   end time 1500  Treatment Type: Individual Therapy  Reported Symptoms: Pt presented via Caregility video for follow up session, stating his understanding of the limits of virtual visits and granting consent for the session.  Pt stated he was in his home and no one else was present.  I shared with pt that I am in my office and no one else is present.  Mental Status Exam: Appearance:  Casual     Behavior: Appropriate  Motor: Normal  Speech/Language:  Clear and Coherent  Affect: Appropriate  Mood: normal  Thought process: normal  Thought content:   WNL  Sensory/Perceptual disturbances:   WNL  Orientation: oriented to person, place, and time/date  Attention: Good  Concentration: Good  Memory: WNL  Fund of knowledge:  Good  Insight:   Good  Judgment:  Good  Impulse Control: Good   Risk Assessment: Danger to Self:  No Self-injurious Behavior: No Danger to Others: No Duty to Warn:no Physical Aggression / Violence:No  Access to Firearms a concern: No  Gang Involvement:No   Subjective: Pt shares that "I am continuing to recover from my back procedure; I still have some soreness."  Pt shares that he is planning to return to work on 11/14; he is "worried about being out of work this long.  I don't want them to figure out that they don't need me anymore."  Talked with pt about taking one step at a time and to try to develop a plan to get through the backlog of work that he will find when he returns.  Pt shares that he is locked out of his email from work since he is on a medical leave from work.  Pt went to [place], [place] last weekend to a [place] and [employer] and he enjoyed that.  He also visited with a cousin in [place] while he was in the area.  He is going to [place], [place] this weekend for a re-enactment, including an  event for about 2500 school kids.  He is going to be visiting with his friend who has cancer as well; his friend has been told by his doctors that he only has about a year to live.  He also has been remembering his other friend, Ephriam Green, who died a year ago.  Pt shares that he has been sleeping well since his procedure; he is able to get comfortable when he is lying down.  Pt shares that his grand daughter (Kenneth Green-"Doodle Green") and grand son (Kenneth Green-"Green") are both doing well; Kenneth Green has started daycare.  Kenneth Green's next cornea transplant is scheduled for 11/30/23 (their 25th wedding anniversary).  She also has a "huge cataract" that has to be removed as well during that procedure.  She will also need to have her right shoulder replaced after the back procedure.  Kenneth Green continues to have back issues and is in line for a spinal cord stimulator trial soon.  Pt has been doing lots of odds and ends around the house during his medical leave and he is glad to have been able to accomplish several things.  His daughter Kenneth Green has been helping them out as well as pt has been healing.  His mom did have her foot surgery this past Monday and is now home recovering.  Pt continues to see staff at [facility]; he continues  to work with Dr. Dewaine Green on the emotional eating aspect of their program.  He appreciates the support they provide for him.  Pt continues to manage his drinking really well; he had none in Sept or Oct and has only had a couple of drinks in Nov.  Pt has been reading some more of the AA Big Book and has found value in what he is reading.  Encouraged pt to continue with his self care activities and we will meet in 4 wks for a follow up session.  Interventions: Cognitive Behavioral Therapy  Diagnosis:Adjustment disorder with mixed anxiety and depressed mood  Plan: Treatment Plan Strengths/Abilities:  Intelligent, Intuitive, Willing to participate in therapy Treatment Preferences:  Outpatient Individual  Therapy Statement of Needs:  Patient is to use CBT, mindfulness and coping skills to help manage and/or decrease symptoms associated with their diagnosis. Symptoms:  Depressed/Irritable mood, worry, social withdrawal Problems Addressed:  Depressive thoughts, Sadness, Sleep issues, etc. Long Term Goals:  Pt to reduce overall level, frequency, and intensity of the feelings of depression as evidenced by decreased irritability, negative self talk, and helpless feelings from 6 to 7 days/week to 0 to 1 days/week, per client report, for at least 3 consecutive months.  Progress: 20% Short Term Goals:  Pt to verbally express understanding of the relationship between feelings of depression and their impact on thinking patterns and behaviors.  Pt to verbalize an understanding of the role that distorted thinking plays in creating fears, excessive worry, and ruminations.  Progress: 20% Target Date:  11/18/2023 Frequency:  Bi-weekly Modality:  Cognitive Behavioral Therapy Interventions by Therapist:  Therapist will use CBT, Mindfulness exercises, Coping skills and Referrals, as needed by client. Client has verbally approved this treatment plan.  Kenneth Green, [facility]

## 2023-10-04 ENCOUNTER — Ambulatory Visit (INDEPENDENT_AMBULATORY_CARE_PROVIDER_SITE_OTHER): Payer: 59 | Admitting: Family Medicine

## 2023-10-04 ENCOUNTER — Encounter (INDEPENDENT_AMBULATORY_CARE_PROVIDER_SITE_OTHER): Payer: Self-pay | Admitting: Family Medicine

## 2023-10-04 VITALS — BP 136/88 | HR 82 | Temp 98.2°F | Ht 69.0 in | Wt 293.0 lb

## 2023-10-04 DIAGNOSIS — F32A Depression, unspecified: Secondary | ICD-10-CM | POA: Diagnosis not present

## 2023-10-04 DIAGNOSIS — E1169 Type 2 diabetes mellitus with other specified complication: Secondary | ICD-10-CM

## 2023-10-04 DIAGNOSIS — E119 Type 2 diabetes mellitus without complications: Secondary | ICD-10-CM | POA: Diagnosis not present

## 2023-10-04 DIAGNOSIS — E782 Mixed hyperlipidemia: Secondary | ICD-10-CM

## 2023-10-04 DIAGNOSIS — F419 Anxiety disorder, unspecified: Secondary | ICD-10-CM | POA: Diagnosis not present

## 2023-10-04 DIAGNOSIS — G8929 Other chronic pain: Secondary | ICD-10-CM

## 2023-10-04 DIAGNOSIS — Z6841 Body Mass Index (BMI) 40.0 and over, adult: Secondary | ICD-10-CM

## 2023-10-04 DIAGNOSIS — F418 Other specified anxiety disorders: Secondary | ICD-10-CM

## 2023-10-04 DIAGNOSIS — E669 Obesity, unspecified: Secondary | ICD-10-CM

## 2023-10-04 DIAGNOSIS — Z7985 Long-term (current) use of injectable non-insulin antidiabetic drugs: Secondary | ICD-10-CM

## 2023-10-04 DIAGNOSIS — M545 Low back pain, unspecified: Secondary | ICD-10-CM

## 2023-10-04 DIAGNOSIS — E559 Vitamin D deficiency, unspecified: Secondary | ICD-10-CM

## 2023-10-04 NOTE — Progress Notes (Signed)
.smr  Office: [phone]  /  Fax: [phone]  WEIGHT SUMMARY AND BIOMETRICS  Anthropometric Measurements Height: 5\' 9"  (1.753 m) Weight: 293 lb (132.9 kg) BMI (Calculated): 43.25 Weight at Last Visit: 295 lb Weight Lost Since Last Visit: 2 lb Weight Gained Since Last Visit: 0 Starting Weight: 308 lb Total Weight Loss (lbs): 15 lb (6.804 kg)   Body Composition  Body Fat %: 37.6 % Fat Mass (lbs): 110.4 lbs Muscle Mass (lbs): 174.2 lbs Total Body Water (lbs): 126 lbs Visceral Fat Rating : 25   Other Clinical Data Fasting: No Labs: No Today's Visit #: 62 Starting Date: 04/10/19    Chief Complaint: OBESITY   Discussed the use of AI scribe software for clinical note transcription with the patient, who gave verbal consent to proceed.  History of Present Illness   The patient, diagnosed with obesity and type two diabetes, has been managing his conditions with Mounjaro and a combination of category three diet and calorie counting. He reports a weight loss of two pounds over the past month. He has been increasing his physical activity, specifically walking outdoors for forty-five minutes five times per week on a gentle slope. He notes a decrease in hunger and is doing well with protein intake. He has been keeping track of his calorie intake mentally and feels he is managing well overall.  The patient checks his blood sugars at home, reporting readings in the range of 100 to 110, mostly fasting in the morning. He has not been checking postprandial blood sugars.  The patient reports good sleep quality, getting seven to eight hours per night, although he occasionally wakes up due to back pain. He has been diagnosed with spinal stenosis and is two months post-surgery for the condition. He wears a back brace when active outside the house, which helps remind him not to lift heavy objects and provides some protection.  The patient has been working with counselors to manage stress,  anxiety, and depression. He has been experiencing grief due to the recent loss of a friend and the serious illness of another. He is learning strategies to cope with these emotions without resorting to comfort eating.  The patient acknowledges the need to improve his cooking habits and increase his vegetable intake. He has a supply of Mounjaro that will last until his next appointment.          PHYSICAL EXAM:  Blood pressure 136/88, pulse 82, temperature 98.2 F (36.8 C), height 5\' 9"  (1.753 m), weight 293 lb (132.9 kg), SpO2 97%. Body mass index is 43.27 kg/m.  DIAGNOSTIC DATA REVIEWED:  BMET    Component Value Date/Time   NA 138 05/24/2023 0000   K 4.1 05/24/2023 0000   CL 102 05/24/2023 0000   CO2 30 (A) 05/24/2023 0000   GLUCOSE 105 (H) 02/22/2023 1540   GLUCOSE 106 (H) 06/15/2016 1700   BUN 15 05/24/2023 0000   CREATININE 1.1 05/24/2023 0000   CREATININE 1.08 02/22/2023 1540   CALCIUM 9.7 05/24/2023 0000   GFRNONAA 74 05/11/2021 0000   GFRAA 91 01/15/2021 1322   Lab Results  Component Value Date   HGBA1C 6.0 (H) 02/22/2023   HGBA1C 5.9 (H) 04/10/2019   Lab Results  Component Value Date   INSULIN 39.6 (H) 02/22/2023   INSULIN 30.9 (H) 04/10/2019   Lab Results  Component Value Date   TSH 1.020 02/22/2023   CBC    Component Value Date/Time   WBC 5.1 02/22/2023 1540   WBC 7.7  06/15/2016 1700   RBC 5.03 02/22/2023 1540   RBC 4.87 06/15/2016 1700   HGB 14.2 02/22/2023 1540   HCT 43.3 02/22/2023 1540   PLT 205 02/22/2023 1540   MCV 86 02/22/2023 1540   MCH 28.2 02/22/2023 1540   MCH 29.0 06/15/2016 1700   MCHC 32.8 02/22/2023 1540   MCHC 34.6 06/15/2016 1700   RDW 13.3 02/22/2023 1540   Iron Studies No results found for: "IRON", "TIBC", "FERRITIN", "IRONPCTSAT" Lipid Panel     Component Value Date/Time   CHOL 177 05/24/2023 0000   CHOL 200 (H) 02/22/2023 1540   TRIG 124 05/24/2023 0000   HDL 44 05/24/2023 0000   HDL 53 02/22/2023 1540   LDLCALC  110 05/24/2023 0000   LDLCALC 131 (H) 02/22/2023 1540   Hepatic Function Panel     Component Value Date/Time   PROT 6.8 02/22/2023 1540   ALBUMIN 4.3 05/24/2023 0000   ALBUMIN 4.4 02/22/2023 1540   AST 23 05/24/2023 0000   ALT 28 05/24/2023 0000   ALKPHOS 62 02/22/2023 1540   BILITOT 0.3 02/22/2023 1540      Component Value Date/Time   TSH 1.020 02/22/2023 1540   Nutritional Lab Results  Component Value Date   VD25OH 33.6 02/22/2023   VD25OH 31.4 01/05/2023   VD25OH 41.0 09/13/2022     Assessment and Plan    Obesity Actively working on weight loss through calorie counting and increased physical activity. Lost two pounds in the last month. Walking 45 minutes five times per week. Reported decreased hunger. Focusing on protein intake and aware of the need to improve vegetable consumption. Discussed using AI program for meal planning and recipes. - Continue current exercise regimen of walking 45 minutes five times per week. - Continue calorie counting and protein intake. - Increase vegetable consumption. - Consider using AI program for meal planning and recipes.  Type 2 Diabetes Mellitus On Mounjaro with no issues. Blood sugar levels within 100-110 mg/dL range, primarily fasting levels. Greggory Keen is particularly effective for postprandial blood sugars. Suggested checking postprandial levels out of curiosity. - Continue Mounjaro as prescribed. - Monitor blood sugar levels, including occasional postprandial checks. - No immediate need for medication refill; has enough supply until the next appointment.  Spinal Stenosis (Post-Surgical) Two months post-surgery with improvement in back pain. Uses a supportive belt when active, which helps avoid lifting and bending. Cleared to return to work this Thursday. - Continue using the supportive belt during activities outside the home. - Follow up with the surgeon as needed.  Depression and Anxiety Experiencing stress, anxiety, and  depression, exacerbated by recent losses of friends and family. Working with counselors on coping strategies and has found some techniques helpful. - Continue counseling sessions for stress, anxiety, and depression management. - Utilize coping strategies and diversionary tactics as recommended by counselors.  General Health Maintenance Getting 7-8 hours of quality sleep per night, beneficial for weight loss and overall health. Mindful of diet and exercise but needs to improve vegetable intake. - Maintain 7-8 hours of quality sleep per night. - Focus on cooking healthier meals and increasing vegetable intake. -will check labs today  Follow-up - Attend follow-up appointment in December. - Schedule January appointment soon.         I have personally spent 40 minutes total time today in preparation, patient care, and documentation for this visit, including the following: review of clinical lab tests; review of medical tests/procedures/services.    He was informed of the importance of  frequent follow up visits to maximize his success with intensive lifestyle modifications for his multiple health conditions.    Quillian Quince, MD

## 2023-10-05 LAB — LIPID PANEL WITH LDL/HDL RATIO
Cholesterol, Total: 213 mg/dL — ABNORMAL HIGH (ref 100–199)
HDL: 49 mg/dL (ref >39)
LDL Chol Calc (NIH): 140 mg/dL — ABNORMAL HIGH (ref 0–99)
LDL/HDL Ratio: 2.9 ratio (ref 0.0–3.6)
Triglycerides: 132 mg/dL (ref 0–149)
VLDL Cholesterol Cal: 24 mg/dL (ref 5–40)

## 2023-10-05 LAB — CMP14+EGFR
ALT: 37 [IU]/L (ref 0–44)
AST: 31 [IU]/L (ref 0–40)
Albumin: 4.5 g/dL (ref 3.8–4.9)
Alkaline Phosphatase: 73 [IU]/L (ref 44–121)
BUN/Creatinine Ratio: 14 (ref 9–20)
BUN: 14 mg/dL (ref 6–24)
Bilirubin Total: 0.4 mg/dL (ref 0.0–1.2)
CO2: 24 mmol/L (ref 20–29)
Calcium: 10.1 mg/dL (ref 8.7–10.2)
Chloride: 101 mmol/L (ref 96–106)
Creatinine, Ser: 1 mg/dL (ref 0.76–1.27)
Globulin, Total: 2.8 g/dL (ref 1.5–4.5)
Glucose: 90 mg/dL (ref 70–99)
Potassium: 3.6 mmol/L (ref 3.5–5.2)
Sodium: 139 mmol/L (ref 134–144)
Total Protein: 7.3 g/dL (ref 6.0–8.5)
eGFR: 87 mL/min/{1.73_m2} (ref >59)

## 2023-10-05 LAB — INSULIN, RANDOM: INSULIN: 20.9 u[IU]/mL (ref 2.6–24.9)

## 2023-10-05 LAB — VITAMIN B12: Vitamin B-12: 402 pg/mL (ref 232–1245)

## 2023-10-05 LAB — HEMOGLOBIN A1C
Est. average glucose Bld gHb Est-mCnc: 120 mg/dL
Hgb A1c MFr Bld: 5.8 % — ABNORMAL HIGH (ref 4.8–5.6)

## 2023-10-05 LAB — VITAMIN D 25 HYDROXY (VIT D DEFICIENCY, FRACTURES): Vit D, 25-Hydroxy: 41.2 ng/mL (ref 30.0–100.0)

## 2023-10-10 ENCOUNTER — Other Ambulatory Visit (HOSPITAL_COMMUNITY): Payer: Self-pay

## 2023-10-11 ENCOUNTER — Other Ambulatory Visit (HOSPITAL_COMMUNITY): Payer: Self-pay

## 2023-10-24 ENCOUNTER — Telehealth (INDEPENDENT_AMBULATORY_CARE_PROVIDER_SITE_OTHER): Payer: 59 | Admitting: Psychology

## 2023-10-24 DIAGNOSIS — F4323 Adjustment disorder with mixed anxiety and depressed mood: Secondary | ICD-10-CM | POA: Diagnosis not present

## 2023-10-24 DIAGNOSIS — F5089 Other specified eating disorder: Secondary | ICD-10-CM

## 2023-10-24 NOTE — Progress Notes (Signed)
  Office: [phone]  /  Fax: [phone]    Date: October 24, 2023  Appointment Start Time: 2:30pm Duration: 33 minutes Provider: Lawerance Cruel, Psy.D. Type of Session: Individual Therapy  Location of Patient: Home (private location) Location of Provider: Provider's Home (private office) Type of Contact: Telepsychological Visit via MyChart Video Visit  Session Content: Kenneth Green is a 57 y.o. male presenting for a follow-up appointment to address the previously established treatment goal of increasing coping skills.Today's appointment was a telepsychological visit. [name] provided verbal consent for today's telepsychological appointment and he is aware he is responsible for securing confidentiality on his end of the session. Prior to proceeding with today's appointment, [name]'s physical location at the time of this appointment was obtained as well a phone number he could be reached at in the event of technical difficulties. [name] and this provider participated in today's telepsychological service.   This provider conducted a brief check-in. Jaason shared he continues to recover from his back surgery, noting recent challenges with sleeping and back spasms. Discussed what went well with eating habits during Thanksgiving and what he could do differently during future holidays. Psychoeducation regarding making better choices and engaging in portion control during the holidays/celebrations/vacations was provided. More specifically, this provider discussed the following strategies: coming to meals hungry, but not starving; avoid filling up on appetizers; managing portion sizes; not completely depriving yourself; making the plate colorful (e.g., vegetables); pacing yourself (e.g., waiting 10 minutes before going back for seconds); taking advantage of the nutritious foods; practicing mindfulness; staying hydrated; and avoid bringing home leftovers. He also discussed staying away from the break room at  work to avoid snacking, especially when experiencing stress. Positive reinforcement was provided. To further assist with cravings/emotional eating behaviors, this provider discussed the S.W.A.P. (say, wait, address the feeling not the food, pursue another activity) activity. He was observed writing notes. Overall, [name] was receptive to today's appointment as evidenced by openness to sharing, responsiveness to feedback, and willingness to implement the S.W.A.P. technique as needed.  Mental Status Examination:  Appearance: neat Behavior: appropriate to circumstances Mood: neutral Affect: mood congruent Speech: WNL Eye Contact: appropriate Psychomotor Activity: WNL Gait: unable to assess Thought Process: linear, logical, and goal directed and no evidence or endorsement of suicidal, homicidal, and self-harm ideation, plan and intent  Thought Content/Perception: no hallucinations, delusions, bizarre thinking or behavior endorsed or observed Orientation: AAOx4 Memory/Concentration: intact Insight: good Judgment: good  Interventions:  Conducted a brief chart review Provided empathic reflections and validation Reviewed content from the previous session Provided positive reinforcement Employed supportive psychotherapy interventions to facilitate reduced distress and to improve coping skills with identified stressors Engaged patient in problem solving  DSM-5 Diagnosis(es):  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 [name] has demonstrated progress in his goal as evidenced by increased awareness of hunger patterns, increased awareness of triggers for emotional eating behaviors, and reduction in emotional eating behaviors . [name] also continues  to demonstrate willingness to engage in learned skill(s).  Plan: The next appointment is scheduled for 11/14/2023 at 2pm, which will be via MyChart Video Visit. The next session will focus on working towards the established treatment goal. [name] will continue with his primary therapist.

## 2023-10-26 ENCOUNTER — Ambulatory Visit: Payer: 59 | Admitting: Psychology

## 2023-10-26 DIAGNOSIS — F4323 Adjustment disorder with mixed anxiety and depressed mood: Secondary | ICD-10-CM | POA: Diagnosis not present

## 2023-10-26 NOTE — Progress Notes (Signed)
[facility] Counselor/Therapist Progress Note  [name], MRN: [id number],    Date: 10/26/2023  Time Spent: 45 mins  start time: 1400   end time 1445  Treatment Type: Individual Therapy  Reported Symptoms: Pt presented via Caregility video for follow up session, stating his understanding of the limits of virtual visits and granting consent for the session.  Pt stated he was in his home and no one else was present.  I shared with pt that I am in my office and no one else is present.  Mental Status Exam: Appearance:  Casual     Behavior: Appropriate  Motor: Normal  Speech/Language:  Clear and Coherent  Affect: Appropriate  Mood: normal  Thought process: normal  Thought content:   WNL  Sensory/Perceptual disturbances:   WNL  Orientation: oriented to person, place, and time/date  Attention: Good  Concentration: Good  Memory: WNL  Fund of knowledge:  Good  Insight:   Good  Judgment:  Good  Impulse Control: Good   Risk Assessment: Danger to Self:  No Self-injurious Behavior: No Danger to Others: No Duty to Warn:no Physical Aggression / Violence:No  Access to Firearms a concern: No  Gang Involvement:No   Subjective: Pt shares that "We had a good Thanksgiving; everyone brought food and it was great; we ate at my mother's house."  Pt shares that he has had a bit of a setback in his recovery from his back surgery; he has been having more pain in the past week; he saw the surgeon this morning and the x-rays looked good;  They are going to try oral steroids first to see if that will help reduce the pain.  Pt shares the pain is interfering with his sleep; "I have trouble getting and staying comfortable."  He has returned to work and that is going well.  Pt shares that no one touched his responsibilities while he was out; he is no longer worried that the company might not need him.  He had over 900 emails in his mailbox when he got back to work.  The  re-enactment in [place], [place] was fun for pt and he did well with his back; he did have to make some decisions about what activities to engage in but he did enjoy the event.  Pt shares that [name] is doing OK; the ins company has not approved the spinal cord stimulator and the surgeon is supposed to do a peer-to-peer review tomorrow and they are hopeful it will get approved then.  Pt shares that he has seen [facility] since our last appt and "things there are going well.  I also saw Dr. Dewaine Conger and she gave me some more things to work on as well."  Pt shares that his drinking has been managed well of late and he feels good about that too.  Pt shares that his grand daughter ([name]) and grand son ([name]) are both doing well; [name] has started daycare; she enjoys going to daycare but she has been through the sicknesses at daycare and is getting past those now.  [name]'s next cornea transplant is scheduled for 11/30/23 (their 25th wedding anniversary).  Pt has not has a chance to do any more reading of the AA Big Book, but is hoping to get back to it soon.  Encouraged pt to continue with his self care activities and we will meet in 5 wks for a follow up session, due to the holidays.  Interventions: Cognitive Behavioral Therapy  Diagnosis:Adjustment disorder with mixed anxiety and depressed mood  Plan: Treatment Plan Strengths/Abilities:  Intelligent, Intuitive, Willing to participate in therapy Treatment Preferences:  Outpatient Individual Therapy Statement of Needs:  Patient is to use CBT, mindfulness and coping skills to help manage and/or decrease symptoms associated with their diagnosis. Symptoms:  Depressed/Irritable mood, worry, social withdrawal Problems Addressed:  Depressive thoughts, Sadness, Sleep issues, etc. Long Term Goals:  Pt to reduce overall level, frequency, and intensity of the feelings of depression as evidenced by decreased irritability, negative self talk, and helpless  feelings from 6 to 7 days/week to 0 to 1 days/week, per client report, for at least 3 consecutive months.  Progress: 20% Short Term Goals:  Pt to verbally express understanding of the relationship between feelings of depression and their impact on thinking patterns and behaviors.  Pt to verbalize an understanding of the role that distorted thinking plays in creating fears, excessive worry, and ruminations.  Progress: 20% Target Date:  11/17/2024 Frequency:  Bi-weekly Modality:  Cognitive Behavioral Therapy Interventions by Therapist:  Therapist will use CBT, Mindfulness exercises, Coping skills and Referrals, as needed by client. Client has verbally approved this treatment plan.  Karie Kirks, [facility]

## 2023-10-31 ENCOUNTER — Ambulatory Visit (INDEPENDENT_AMBULATORY_CARE_PROVIDER_SITE_OTHER): Payer: Self-pay | Admitting: Family Medicine

## 2023-11-01 ENCOUNTER — Ambulatory Visit (INDEPENDENT_AMBULATORY_CARE_PROVIDER_SITE_OTHER): Payer: 59 | Admitting: Behavioral Health

## 2023-11-01 ENCOUNTER — Encounter: Payer: Self-pay | Admitting: Behavioral Health

## 2023-11-01 DIAGNOSIS — F331 Major depressive disorder, recurrent, moderate: Secondary | ICD-10-CM | POA: Diagnosis not present

## 2023-11-01 DIAGNOSIS — F411 Generalized anxiety disorder: Secondary | ICD-10-CM

## 2023-11-01 DIAGNOSIS — F3289 Other specified depressive episodes: Secondary | ICD-10-CM

## 2023-11-01 MED ORDER — SERTRALINE HCL 50 MG PO TABS: 50.0000 mg | ORAL_TABLET | Freq: Every day | ORAL | 0 refills | Status: DC

## 2023-11-01 MED ORDER — BUPROPION HCL ER (SR) 200 MG PO TB12: ORAL_TABLET | ORAL | 2 refills | Status: DC

## 2023-11-01 NOTE — Progress Notes (Signed)
[facility]  [name],  MRN: [id number]  PCP: Gweneth Dimitri, MD  Date of Evaluation: 11/01/2023 Time spent:30 minutes  Chief Complaint:  Chief Complaint   Anxiety; Depression; Follow-up; Patient Education; Medication Refill     HISTORY/CURRENT STATUS: HPI  57 year old male presents to this office for follow up and medication management. He is calm and smiling. Says "every thing is on track and doing great".  Says this has been a good period and he does not want to change or adjust medications this visit. He continues in psychotherapy.  He reports his anxiety today at 3/10  and depression at 4/10. Says he is sleeping 7-8 hours nightly. Denies mania, no psychosis. No SI/HI.    No Past medication failures   Individual Medical History/ Review of Systems: Changes? :No   Allergies: Iohexol  Current Medications:  Current Outpatient Medications:    benazepril-hydrochlorthiazide (LOTENSIN HCT) 10-12.5 MG tablet, Take 1 tablet by mouth 2 (two) times daily., Disp: 60 tablet, Rfl: 1   buPROPion (WELLBUTRIN SR) 200 MG 12 hr tablet, TAKE 1 TABLET BY MOUTH TWICE  DAILY, Disp: 180 tablet, Rfl: 2   fexofenadine (ALLEGRA) 180 MG tablet, Take 180 mg by mouth daily., Disp: , Rfl:    latanoprost (XALATAN) 0.005 % ophthalmic solution, SMARTSIG:In Eye(s), Disp: , Rfl:    pregabalin (LYRICA) 150 MG capsule, Take 150 mg by mouth 2 (two) times daily., Disp: , Rfl:    sertraline (ZOLOFT) 50 MG tablet, Take 1 tablet (50 mg total) by mouth daily., Disp: 90 tablet, Rfl: 0   tirzepatide (MOUNJARO) 5 MG/0.5ML Pen, Inject 5 mg into the skin once a week., Disp: 2 mL, Rfl: 0   Vitamin D, Ergocalciferol, (DRISDOL) 1.25 MG (50000 UNIT) CAPS capsule, Take 1 capsule (50,000 Units total) by mouth every 7 (seven) days., Disp: 4 capsule, Rfl: 1 Medication Side Effects: none  Family Medical/ Social History: Changes? No  MENTAL HEALTH EXAM:  There were no vitals taken for this  visit.There is no height or weight on file to calculate BMI.  General Appearance: Casual, Neat, and Well Groomed  Eye Contact:  NA  Speech:  Clear and Coherent  Volume:  Normal  Mood:  NA  Affect:  Appropriate  Thought Process:  Coherent  Orientation:  Full (Time, Place, and Person)  Thought Content: Logical   Suicidal Thoughts:  No  Homicidal Thoughts:  No  Memory:  WNL  Judgement:  Good  Insight:  Good  Psychomotor Activity:  Normal  Concentration:  Concentration: Good  Recall:  Good  Fund of Knowledge: Good  Language: Good  Assets:  Desire for Improvement  ADL's:  Intact  Cognition: WNL  Prognosis:  Good    DIAGNOSES:    ICD-10-CM   1. Major depressive disorder, recurrent episode, moderate (HCC)  F33.1 sertraline (ZOLOFT) 50 MG tablet    2. Generalized anxiety disorder  F41.1 sertraline (ZOLOFT) 50 MG tablet    3. Other depression with emotional eating  F32.89 buPROPion (WELLBUTRIN SR) 200 MG 12 hr tablet      Receiving Psychotherapy: No    RECOMMENDATIONS:    Greater than 50% of 20  min face to face time with patient was spent on counseling and coordination of care.  Happy with his medication right now. No social changes.  We discussed his relationship with spouse and he says things have improved. Reconciled medication.    We discussed the following plan: To continue Zoloft to 50 mg  daily Continue Wellbutrin 200 mg SR two times daily To report side effects or worsening symptoms promptly Provided emergency contact information To follow up in 6 months to reassess. Reviewed PDMP      Joan Flores, NP

## 2023-11-07 ENCOUNTER — Other Ambulatory Visit (HOSPITAL_COMMUNITY): Payer: Self-pay

## 2023-11-07 ENCOUNTER — Encounter (INDEPENDENT_AMBULATORY_CARE_PROVIDER_SITE_OTHER): Payer: Self-pay

## 2023-11-07 ENCOUNTER — Encounter (INDEPENDENT_AMBULATORY_CARE_PROVIDER_SITE_OTHER): Payer: Self-pay | Admitting: Family Medicine

## 2023-11-07 ENCOUNTER — Ambulatory Visit (INDEPENDENT_AMBULATORY_CARE_PROVIDER_SITE_OTHER): Payer: 59 | Admitting: Family Medicine

## 2023-11-07 DIAGNOSIS — E1169 Type 2 diabetes mellitus with other specified complication: Secondary | ICD-10-CM

## 2023-11-07 DIAGNOSIS — E559 Vitamin D deficiency, unspecified: Secondary | ICD-10-CM

## 2023-11-07 MED ORDER — TIRZEPATIDE 5 MG/0.5ML ~~LOC~~ SOAJ
5.0000 mg | SUBCUTANEOUS | 0 refills | Status: DC
  Filled 2023-11-07: qty 2, 28d supply, fill #0

## 2023-11-07 MED ORDER — VITAMIN D (ERGOCALCIFEROL) 1.25 MG (50000 UNIT) PO CAPS
50000.0000 [IU] | ORAL_CAPSULE | ORAL | 0 refills | Status: DC
  Filled 2023-11-07: qty 4, 28d supply, fill #0

## 2023-11-07 NOTE — Telephone Encounter (Signed)
LAST APPOINTMENT DATE: 10/04/2023 NEXT APPOINTMENT DATE:    Kenneth Green LONG - [facility] [address] [place] [address] Phone: [phone] Fax: [phone]  [id number] - [place], [place] - [address] [place] [address] Phone: [phone] Fax: [phone]  [facility] - [place], [place] - [address] [place] [address] Phone: [phone] Fax: [phone]  Patient is requesting a refill of the following medications: Requested Prescriptions   Pending Prescriptions Disp Refills   Vitamin D, Ergocalciferol, (DRISDOL) 1.25 MG (50000 UNIT) CAPS capsule 4 capsule 1    Sig: Take 1 capsule (50,000 Units total) by mouth every 7 (seven) days.   tirzepatide (MOUNJARO) 5 MG/0.5ML Pen 2 mL 0    Sig: Inject 5 mg into the skin once a week.    Date last filled: 10/04/2023 Previously prescribed by [facility]  Lab Results  Component Value Date   HGBA1C 5.8 (H) 10/04/2023   HGBA1C 6.0 (H) 02/22/2023   HGBA1C 5.4 09/13/2022   Lab Results  Component Value Date   MICROALBUR <0.7 05/07/2020   LDLCALC 140 (H) 10/04/2023   CREATININE 1.00 10/04/2023   Lab Results  Component Value Date   VD25OH 41.2 10/04/2023   VD25OH 33.6 02/22/2023   VD25OH 31.4 01/05/2023    BP Readings from Last 3 Encounters:  10/04/23 136/88  09/06/23 132/84  08/09/23 129/85

## 2023-11-08 ENCOUNTER — Other Ambulatory Visit (HOSPITAL_COMMUNITY): Payer: Self-pay

## 2023-11-14 ENCOUNTER — Telehealth (INDEPENDENT_AMBULATORY_CARE_PROVIDER_SITE_OTHER): Payer: 59 | Admitting: Psychology

## 2023-11-28 ENCOUNTER — Telehealth (INDEPENDENT_AMBULATORY_CARE_PROVIDER_SITE_OTHER): Payer: 59 | Admitting: Psychology

## 2023-11-28 DIAGNOSIS — F4323 Adjustment disorder with mixed anxiety and depressed mood: Secondary | ICD-10-CM

## 2023-11-28 DIAGNOSIS — F5089 Other specified eating disorder: Secondary | ICD-10-CM | POA: Diagnosis not present

## 2023-11-28 NOTE — Progress Notes (Signed)
  Office: [phone]  /  Fax: [phone]    Date: November 28, 2023  Appointment Start Time: 11:02am Duration: 27 minutes Provider: Wyatt Fire, Psy.D. Type of Session: Individual Therapy  Location of Patient: Home (private location) Location of Provider: Provider's Home (private office) Type of Contact: Telepsychological Visit via MyChart Video Visit  Session Content: Kenneth Green is a 59 y.o. male presenting for a follow-up appointment to address the previously established treatment goal of increasing coping skills.Today's appointment was a telepsychological visit. Kenneth Green provided verbal consent for today's telepsychological appointment and he is aware he is responsible for securing confidentiality on his end of the session. Prior to proceeding with today's appointment, Stein's physical location at the time of this appointment was obtained as well a phone number he could be reached at in the event of technical difficulties. Kenneth Green and this provider participated in today's telepsychological service.   This provider conducted a brief check-in. Kenneth Green shared about recent events. He shared he continues to do well avoiding the break room at work, noting an overall reduction in emotional eating behaviors. Reviewed progress to date.This provider and Kenneth Green explored the story his mind tells him about his weight loss/eating habits that is stuck on repeat to further assist with overall coping and sustainability. Remainder of the appointment focused on re-writing a more compassionate version. Furthermore, termination planning was discussed. Kenneth Green was receptive to a follow-up appointment in 3-4 weeks and an additional follow-up/termination appointment in 4-5 weeks after that. Overall, Kenneth Green was receptive to today's appointment as evidenced by openness to sharing, responsiveness to feedback, and willingness to continue engaging in learned skills.  Mental Status Examination:  Appearance:  neat Behavior: appropriate to circumstances Mood: neutral Affect: mood congruent Speech: WNL Eye Contact: appropriate Psychomotor Activity: WNL Gait: unable to assess Thought Process: linear, logical, and goal directed and no evidence or endorsement of suicidal, homicidal, and self-harm ideation, plan and intent  Thought Content/Perception: no hallucinations, delusions, bizarre thinking or behavior endorsed or observed Orientation: AAOx4 Memory/Concentration: intact Insight: good Judgment: good  Interventions:  Conducted a brief chart review Provided empathic reflections and validation Reviewed content from the previous session Provided positive reinforcement Employed supportive psychotherapy interventions to facilitate reduced distress and to improve coping skills with identified stressors Discussed termination planning Engaged pt in a self-compassion exercise  DSM-5 Diagnosis(es):  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 Kenneth Green has demonstrated progress in his goal as evidenced by increased awareness of hunger patterns, increased awareness of triggers for emotional eating behaviors, and reduction in emotional eating behaviors . Kenneth Green also continues to demonstrate willingness to engage in learned skill(s).  Plan: The next appointment is scheduled for 12/27/2023 at 2pm, which will be via MyChart Video Visit. The next session will focus on working towards the established treatment goal. Dennise will continue with his primary therapist.    Wyatt Fire, PsyD

## 2023-12-02 ENCOUNTER — Ambulatory Visit: Payer: 59 | Admitting: Psychology

## 2023-12-02 DIAGNOSIS — F4323 Adjustment disorder with mixed anxiety and depressed mood: Secondary | ICD-10-CM | POA: Diagnosis not present

## 2023-12-02 NOTE — Progress Notes (Signed)
 [facility] Counselor/Therapist Progress Note  [name], MRN: [id number],    Date: 12/02/2023  Time Spent: 45 mins  start time: 1400   end time 1445  Treatment Type: Individual Therapy  Reported Symptoms: Pt presented via Caregility video for follow up session, stating his understanding of the limits of virtual visits and granting consent for the session.  Pt stated he was in his home and no one else was present.  I shared with pt that I am in my office and no one else is present.  Mental Status Exam: Appearance:  Casual     Behavior: Appropriate  Motor: Normal  Speech/Language:  Clear and Coherent  Affect: Appropriate  Mood: normal  Thought process: normal  Thought content:   WNL  Sensory/Perceptual disturbances:   WNL  Orientation: oriented to person, place, and time/date  Attention: Good  Concentration: Good  Memory: WNL  Fund of knowledge:  Good  Insight:   Good  Judgment:  Good  Impulse Control: Good   Risk Assessment: Danger to Self:  No Self-injurious Behavior: No Danger to Others: No Duty to Warn:no Physical Aggression / Violence:No  Access to Firearms a concern: No  Gang Involvement:No   Subjective: Pt shares that I hate Christmas and I am just glad we got through it.  Grand kids make it better, but it is still hard.  Pt shares his health is OK; I am still struggling with my weight and eating right.  Also, I am still having some back and leg pain and am hoping it is continue to get better.  Pt followed up with the PA of his back surgeon last week and it was normal.  Pt shares he is sleeping pretty well and he appreciates that.  Kenneth Green developed another eye infection so they had to postpone the cornea replacement; it has been rescheduled to early Feb so the infection can heal.  She has been struggling with depression due to all of her health issues and she will not talk to anyone except for me.  Pt shares that work can be very  stressful.  Pt is looking at potentially retiring but does not have a artist but is looking for someone.  Encouraged him to pursue this to help him plan for his future.  Pt shares that he has been back to [facility] and he had lost 2-3 pounds since his last visit; they continue to be encouraging of him.  Pt shares that his drinking has been managed well of late and he feels good about that too.  Pt shares that his grand daughter (Kenneth Green) and grand son Kenneth Green) are both doing well; they enjoyed Christmas.  Pt describes being in a funk lately.  We talked about why he feels that way and that it makes sense that he feels that way.  Encouraged pt to be as intentional as he can be in engaging in his self care activities as he can be.  Pt has not has a chance to do any more reading of the AA Big Book, but is hoping to get back to it soon.  Encouraged pt to continue with his self care activities and we will meet in 4 wks for a follow up session.  Interventions: Cognitive Behavioral Therapy  Diagnosis:Adjustment disorder with mixed anxiety and depressed mood  Plan: Treatment Plan Strengths/Abilities:  Intelligent, Intuitive, Willing to participate in therapy Treatment Preferences:  Outpatient Individual Therapy Statement of Needs:  Patient is to use  CBT, mindfulness and coping skills to help manage and/or decrease symptoms associated with their diagnosis. Symptoms:  Depressed/Irritable mood, worry, social withdrawal Problems Addressed:  Depressive thoughts, Sadness, Sleep issues, etc. Long Term Goals:  Pt to reduce overall level, frequency, and intensity of the feelings of depression as evidenced by decreased irritability, negative self talk, and helpless feelings from 6 to 7 days/week to 0 to 1 days/week, per client report, for at least 3 consecutive months.  Progress: 20% Short Term Goals:  Pt to verbally express understanding of the relationship between feelings of depression  and their impact on thinking patterns and behaviors.  Pt to verbalize an understanding of the role that distorted thinking plays in creating fears, excessive worry, and ruminations.  Progress: 20% Target Date:  11/17/2024 Frequency:  Bi-weekly Modality:  Cognitive Behavioral Therapy Interventions by Therapist:  Therapist will use CBT, Mindfulness exercises, Coping skills and Referrals, as needed by client. Client has verbally approved this treatment plan.  Francis KATHEE Macintosh, [facility]

## 2023-12-06 ENCOUNTER — Other Ambulatory Visit (HOSPITAL_COMMUNITY): Payer: Self-pay

## 2023-12-06 ENCOUNTER — Encounter (INDEPENDENT_AMBULATORY_CARE_PROVIDER_SITE_OTHER): Payer: Self-pay | Admitting: Family Medicine

## 2023-12-06 ENCOUNTER — Ambulatory Visit (INDEPENDENT_AMBULATORY_CARE_PROVIDER_SITE_OTHER): Payer: Self-pay | Admitting: Family Medicine

## 2023-12-06 VITALS — BP 128/87 | Ht 69.0 in | Wt 297.0 lb

## 2023-12-06 DIAGNOSIS — F3289 Other specified depressive episodes: Secondary | ICD-10-CM

## 2023-12-06 DIAGNOSIS — E669 Obesity, unspecified: Secondary | ICD-10-CM

## 2023-12-06 DIAGNOSIS — F5089 Other specified eating disorder: Secondary | ICD-10-CM

## 2023-12-06 DIAGNOSIS — Z7985 Long-term (current) use of injectable non-insulin antidiabetic drugs: Secondary | ICD-10-CM

## 2023-12-06 DIAGNOSIS — E1169 Type 2 diabetes mellitus with other specified complication: Secondary | ICD-10-CM

## 2023-12-06 DIAGNOSIS — Z6841 Body Mass Index (BMI) 40.0 and over, adult: Secondary | ICD-10-CM

## 2023-12-06 MED ORDER — MOUNJARO 7.5 MG/0.5ML ~~LOC~~ SOAJ
7.5000 mg | SUBCUTANEOUS | 0 refills | Status: DC
  Filled 2023-12-06: qty 2, 28d supply, fill #0

## 2023-12-06 NOTE — Progress Notes (Signed)
 .smr  Office: [phone]  /  Fax: [phone]  WEIGHT SUMMARY AND BIOMETRICS  Anthropometric Measurements Height: 5' 9 (1.753 m) Weight: 297 lb (134.7 kg) BMI (Calculated): 43.84 Weight at Last Visit: 293 lb Weight Lost Since Last Visit: 0 Weight Gained Since Last Visit: 4 lb Starting Weight: 308 lb Total Weight Loss (lbs): 11 lb (4.99 kg)   Body Composition  Body Fat %: 38.7 % Fat Mass (lbs): 115 lbs Muscle Mass (lbs): 173.2 lbs Total Body Water (lbs): 124.2 lbs Visceral Fat Rating : 26   Other Clinical Data Fasting: Yes Labs: No Today's Visit #: 54 Starting Date: 04/10/19    Chief Complaint: OBESITY   History of Present Illness   The patient, diagnosed with obesity and type 2 diabetes, presents for a routine follow-up. He reports a recent weight gain of four pounds over the holiday period. Despite this, he has been adhering to his category four eating plan approximately 20% of the time and continues to engage in regular physical activity, walking for 45 minutes five times per week.  The patient is currently on Mounjaro  5 mg weekly for his diabetes and Lidotran for emotional eating behaviors. He has been experiencing some difficulty remembering to take his Mounjaro  medication consistently and has implemented a phone reminder system to assist with this. He reports no gastrointestinal issues with this medication.  The patient also reports challenges with his diet, particularly around the holiday period. He acknowledges the need to improve his cooking habits and expresses a desire to get back on track with his eating plan. He has been preparing meals such as chili, which provides good sources of protein and vegetables.  In terms of his emotional wellbeing, the patient reports that his work stress levels are manageable. He is also on Wellbutrin  and Zoloft , with no reported issues. The patient expresses a commitment to improving his health behaviors, particularly in  relation to his diet.          PHYSICAL EXAM:  Blood pressure 128/87, height 5' 9 (1.753 m), weight 297 lb (134.7 kg). Body mass index is 43.86 kg/m.  DIAGNOSTIC DATA REVIEWED:  BMET    Component Value Date/Time   NA 139 10/04/2023 1612   K 3.6 10/04/2023 1612   CL 101 10/04/2023 1612   CO2 24 10/04/2023 1612   GLUCOSE 90 10/04/2023 1612   GLUCOSE 106 (H) 06/15/2016 1700   BUN 14 10/04/2023 1612   CREATININE 1.00 10/04/2023 1612   CALCIUM  10.1 10/04/2023 1612   GFRNONAA 74 05/11/2021 0000   GFRAA 91 01/15/2021 1322   Lab Results  Component Value Date   HGBA1C 5.8 (H) 10/04/2023   HGBA1C 5.9 (H) 04/10/2019   Lab Results  Component Value Date   INSULIN  20.9 10/04/2023   INSULIN  30.9 (H) 04/10/2019   Lab Results  Component Value Date   TSH 1.020 02/22/2023   CBC    Component Value Date/Time   WBC 5.1 02/22/2023 1540   WBC 7.7 06/15/2016 1700   RBC 5.03 02/22/2023 1540   RBC 4.87 06/15/2016 1700   HGB 14.2 02/22/2023 1540   HCT 43.3 02/22/2023 1540   PLT 205 02/22/2023 1540   MCV 86 02/22/2023 1540   MCH 28.2 02/22/2023 1540   MCH 29.0 06/15/2016 1700   MCHC 32.8 02/22/2023 1540   MCHC 34.6 06/15/2016 1700   RDW 13.3 02/22/2023 1540   Iron Studies No results found for: IRON, TIBC, FERRITIN, IRONPCTSAT Lipid Panel     Component Value Date/Time  CHOL 213 (H) 10/04/2023 1612   TRIG 132 10/04/2023 1612   HDL 49 10/04/2023 1612   LDLCALC 140 (H) 10/04/2023 1612   Hepatic Function Panel     Component Value Date/Time   PROT 7.3 10/04/2023 1612   ALBUMIN 4.5 10/04/2023 1612   AST 31 10/04/2023 1612   ALT 37 10/04/2023 1612   ALKPHOS 73 10/04/2023 1612   BILITOT 0.4 10/04/2023 1612      Component Value Date/Time   TSH 1.020 02/22/2023 1540   Nutritional Lab Results  Component Value Date   VD25OH 41.2 10/04/2023   VD25OH 33.6 02/22/2023   VD25OH 31.4 01/05/2023     Assessment and Plan    Type 2 Diabetes Mellitus Currently on  Mounjaro  5 mg weekly. No gastrointestinal issues reported but sometimes forgets to take the medication. Agreed to increase the dose to 7.5 mg weekly. Discussed the importance of regular dosing and potential GI side effects, particularly constipation. Advised to use stool softeners if constipation occurs. - Increase Mounjaro  dose to 7.5 mg weekly - Set reminders for regular dosing - Monitor for GI side effects, particularly constipation - Consider stool softeners if constipation occurs  Obesity Gained four pounds in the last two months, with three pounds being real weight gain and one pound being water weight. Following category four eating plan 20% of the time. Walking for exercise 45 minutes five times per week. Discussed challenges of avoiding unhealthy foods, especially during holidays. Emphasized importance of cooking healthier meals and avoiding shopping when hungry. - Continue current exercise regimen - Encourage adherence to dietary plan - Discuss strategies to avoid unhealthy foods - Schedule follow-up in four weeks  Emotional Eating On Wellbutrin  for emotional eating behaviors and requests a refill. Acknowledges the challenge of avoiding unhealthy foods, especially during the holidays. Discussed strategies to manage emotional eating, including keeping unhealthy foods away and ensuring adequate intake of healthy foods. - Continue Wellbutrin  prescription and continue Zoloft  - Continue to avoid unhealthy foods - Discuss strategies to manage emotional eating    Follow-up - Schedule follow-up appointment in four weeks.         He was informed of the importance of frequent follow up visits to maximize his success with intensive lifestyle modifications for his multiple health conditions.    [name], MD

## 2023-12-09 ENCOUNTER — Other Ambulatory Visit (HOSPITAL_COMMUNITY): Payer: Self-pay

## 2023-12-27 ENCOUNTER — Telehealth (INDEPENDENT_AMBULATORY_CARE_PROVIDER_SITE_OTHER): Payer: 59 | Admitting: Psychology

## 2023-12-27 DIAGNOSIS — F4323 Adjustment disorder with mixed anxiety and depressed mood: Secondary | ICD-10-CM

## 2023-12-27 DIAGNOSIS — F5089 Other specified eating disorder: Secondary | ICD-10-CM | POA: Diagnosis not present

## 2023-12-27 NOTE — Progress Notes (Signed)
  Office: [phone]  /  Fax: [phone]    Date: December 27, 2023  Appointment Start Time: 2:02pm Duration: 32 minutes Provider: Wyatt Fire, Psy.D. Type of Session: Individual Therapy  Location of Patient: Parked in car (address obtained; private location) Location of Provider: Provider's Home (private office) Type of Contact: Telepsychological Visit via MyChart Video Visit  Session Content: Kenneth Green is a 58 y.o. male presenting for a follow-up appointment to address the previously established treatment goal of increasing coping skills.Today's appointment was a telepsychological visit. Kenneth Green provided verbal consent for today's telepsychological appointment and he is aware he is responsible for securing confidentiality on his end of the session. Prior to proceeding with today's appointment, Kenneth Green's physical location at the time of this appointment was obtained as well a phone number he could be reached at in the event of technical difficulties. Kenneth Green and this provider participated in today's telepsychological service.   Of note, today's appointment was switched to a regular telephone call at 2:10pm with Kenneth Green's verbal consent due to technical issues.   This provider conducted a brief check-in. Kenneth Green shared about ongoing stressors (e.g., best friend's deteriorating health and back pain), adding his wife is currently in eye surgery. Associated thoughts and feelings processed. Today's appointment focused on grief, as he feels guilty for not grieving enough. Overall, Kenneth Green was receptive to today's appointment as evidenced by openness to sharing and responsiveness to feedback.  Mental Status Examination:  Appearance: neat Behavior: appropriate to circumstances Mood: sad Affect: mood congruent Speech: WNL Eye Contact: appropriate Psychomotor Activity: WNL Gait: unable to assess Thought Process: linear, logical, and goal directed and no evidence or endorsement of suicidal,  homicidal, and self-harm ideation, plan and intent  Thought Content/Perception: no hallucinations, delusions, bizarre thinking or behavior endorsed or observed Orientation: AAOx4 Memory/Concentration: intact Insight: good Judgment: good  Interventions:  Conducted a brief chart review Provided empathic reflections and validation Employed supportive psychotherapy interventions to facilitate reduced distress and to improve coping skills with identified stressors Psychoeducation regarding grief  DSM-5 Diagnosis(es):  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 Kenneth Green has demonstrated progress in his goal as evidenced by increased awareness of hunger patterns, increased awareness of triggers for emotional eating behaviors, and reduction in emotional eating behaviors . Kenneth Green also continues to demonstrate willingness to engage in learned skill(s).  Plan: The next appointment is scheduled for 01/23/2024 at 2pm, which will be via MyChart Video Visit. The next session will focus on working towards the established treatment goal. Kenneth Green will continue with his primary therapist and increase frequency of appointments.    Wyatt Fire, PsyD

## 2023-12-30 ENCOUNTER — Ambulatory Visit: Payer: 59 | Admitting: Psychology

## 2023-12-30 DIAGNOSIS — F4323 Adjustment disorder with mixed anxiety and depressed mood: Secondary | ICD-10-CM | POA: Diagnosis not present

## 2023-12-30 NOTE — Progress Notes (Signed)
 [facility] Counselor/Therapist Progress Note  [name], MRN: [id number],    Date: 12/30/2023  Time Spent: 60 mins  start time: 1400   end time 1500  Treatment Type: Individual Therapy  Reported Symptoms: Pt presented via Caregility video for follow up session, stating his understanding of the limits of virtual visits and granting consent for the session.  Pt stated he was in his home and no one else was present.  I shared with pt that I am in my office and no one else is present.  Mental Status Exam: Appearance:  Casual     Behavior: Appropriate  Motor: Normal  Speech/Language:  Clear and Coherent  Affect: Appropriate  Mood: normal  Thought process: normal  Thought content:   WNL  Sensory/Perceptual disturbances:   WNL  Orientation: oriented to person, place, and time/date  Attention: Good  Concentration: Good  Memory: WNL  Fund of knowledge:  Good  Insight:   Good  Judgment:  Good  Impulse Control: Good   Risk Assessment: Danger to Self:  No Self-injurious Behavior: No Danger to Others: No Duty to Warn:no Physical Aggression / Violence:No  Access to Firearms a concern: No  Gang Involvement:No   Subjective: Pt shares that I have not been doing great lately.  [name] had her cornea transplant and that went well.  My friend Signe passed away on 11/21/2024 and his funeral will be on Sunday; I am going to [place] for the funeral.  Pt shares that Winton can see light and dark and shapes; her vision will continue to improve over the next 2-4 weeks.  They have found that she has some kind of lung disease that is treatable but not curable.  Winton has researched the disease and believes she will live about 3-5 years.  They have an appt to meet with Praveen Mannam, MD at [facility] in about 3 wks.  Things are rough; there has been one thing after another for years.  Pt also shares that his brother also had back surgery around Christmas time; his brother  also has a heart condition, like his mom and pt is concerned that he might be effected by it as well.  Pt has told his PCP and he has gotten an EKG; the results of that test were good.  Pt shares that he is not doing well with his healthy eating, given all of the stress he is under.  He is taking Monjauro and has been on it for 3-4 months; he believes it is helping him.  Encouraged pt to continue with his self care activities and we will meet in 2 wks for a follow up session.  Interventions: Cognitive Behavioral Therapy  Diagnosis:Adjustment disorder with mixed anxiety and depressed mood  Plan: Treatment Plan Strengths/Abilities:  Intelligent, Intuitive, Willing to participate in therapy Treatment Preferences:  Outpatient Individual Therapy Statement of Needs:  Patient is to use CBT, mindfulness and coping skills to help manage and/or decrease symptoms associated with their diagnosis. Symptoms:  Depressed/Irritable mood, worry, social withdrawal Problems Addressed:  Depressive thoughts, Sadness, Sleep issues, etc. Long Term Goals:  Pt to reduce overall level, frequency, and intensity of the feelings of depression as evidenced by decreased irritability, negative self talk, and helpless feelings from 6 to 7 days/week to 0 to 1 days/week, per client report, for at least 3 consecutive months.  Progress: 20% Short Term Goals:  Pt to verbally express understanding of the relationship between feelings of depression and their  impact on thinking patterns and behaviors.  Pt to verbalize an understanding of the role that distorted thinking plays in creating fears, excessive worry, and ruminations.  Progress: 20% Target Date:  11/17/2024 Frequency:  Bi-weekly Modality:  Cognitive Behavioral Therapy Interventions by Therapist:  Therapist will use CBT, Mindfulness exercises, Coping skills and Referrals, as needed by client. Client has verbally approved this treatment plan.  Francis KATHEE Macintosh, [facility]

## 2024-01-03 ENCOUNTER — Other Ambulatory Visit (HOSPITAL_COMMUNITY): Payer: Self-pay

## 2024-01-04 NOTE — Addendum Note (Signed)
Addended by: Maggie Font B on: 01/04/2024 10:45 AM   Modules accepted: Level of Service

## 2024-01-06 ENCOUNTER — Other Ambulatory Visit (INDEPENDENT_AMBULATORY_CARE_PROVIDER_SITE_OTHER): Payer: Self-pay | Admitting: Family Medicine

## 2024-01-06 DIAGNOSIS — E1169 Type 2 diabetes mellitus with other specified complication: Secondary | ICD-10-CM

## 2024-01-06 DIAGNOSIS — E559 Vitamin D deficiency, unspecified: Secondary | ICD-10-CM

## 2024-01-11 ENCOUNTER — Other Ambulatory Visit (INDEPENDENT_AMBULATORY_CARE_PROVIDER_SITE_OTHER): Payer: Self-pay | Admitting: Family Medicine

## 2024-01-11 DIAGNOSIS — E1169 Type 2 diabetes mellitus with other specified complication: Secondary | ICD-10-CM

## 2024-01-11 DIAGNOSIS — E559 Vitamin D deficiency, unspecified: Secondary | ICD-10-CM

## 2024-01-13 ENCOUNTER — Ambulatory Visit: Payer: 59 | Admitting: Psychology

## 2024-01-13 DIAGNOSIS — F4323 Adjustment disorder with mixed anxiety and depressed mood: Secondary | ICD-10-CM

## 2024-01-13 NOTE — Progress Notes (Signed)
[facility] Counselor/Therapist Progress Note  [name], MRN: [id number],    Date: 01/13/2024  Time Spent: 50 mins  start time: 1400   end time 1450  Treatment Type: Individual Therapy  Reported Symptoms: Pt presented via Caregility video for follow up session, stating his understanding of the limits of virtual visits and granting consent for the session.  Pt stated he was in his home and no one else was present.  I shared with pt that I am in my office and no one else is present.  Mental Status Exam: Appearance:  Casual     Behavior: Appropriate  Motor: Normal  Speech/Language:  Clear and Coherent  Affect: Appropriate  Mood: normal  Thought process: normal  Thought content:   WNL  Sensory/Perceptual disturbances:   WNL  Orientation: oriented to person, place, and time/date  Attention: Good  Concentration: Good  Memory: WNL  Fund of knowledge:  Good  Insight:   Good  Judgment:  Good  Impulse Control: Good   Risk Assessment: Danger to Self:  No Self-injurious Behavior: No Danger to Others: No Duty to Warn:no Physical Aggression / Violence:No  Access to Firearms a concern: No  Gang Involvement:No   Subjective: Pt shares that "I have been doing OK.  I still have my moments about my buddy who passed away and I am not sure how to handle that."  Talked with pt about how to grieve this loss.  Pt shares the funeral was "really nice.  My daughter and I went together to the funeral."  Pt shares that the [place] re-enactment in coming up in early March.  That will be the first time they have had an event since Jim's passing and pt is not sure how he is going to handle Rosanne Ashing not being there in [place].  Pt is again wondering whether he wants to continue the re-enacting experience.  Pt shares that [name]'s vision is improving slowly and the doctor is pleased with her progress.  She has to have another CT scan and then they have an appt to meet with Chilton Greathouse, MD at [facility] on 3/26.  Pt shares that he has made an appt to see his back surgeon on Monday because he does not think he is improving as well as he wants to.  He is having more back pain since the surgery in Sept.  He is planning to ask the doctor about PT to help him build up his strength again.  Pt shares his mom is doing well.  His daughters and grandkids are doing well too.  Pt is scheduled to see the [facility] staff next week.  He knows he needs to get back with a good eating plan; talked with pt about pre-packaged meal plans and the ease of using those.  He is taking Monjauro and has been on it for 4 months; he believes it is helping him.  Encouraged pt to continue with his self care activities and we will meet in 2 wks for a follow up session.  Interventions: Cognitive Behavioral Therapy  Diagnosis:Adjustment disorder with mixed anxiety and depressed mood  Plan: Treatment Plan Strengths/Abilities:  Intelligent, Intuitive, Willing to participate in therapy Treatment Preferences:  Outpatient Individual Therapy Statement of Needs:  Patient is to use CBT, mindfulness and coping skills to help manage and/or decrease symptoms associated with their diagnosis. Symptoms:  Depressed/Irritable mood, worry, social withdrawal Problems Addressed:  Depressive thoughts, Sadness, Sleep issues, etc. Long Term Goals:  Pt to reduce overall level, frequency, and intensity of the feelings of depression as evidenced by decreased irritability, negative self talk, and helpless feelings from 6 to 7 days/week to 0 to 1 days/week, per client report, for at least 3 consecutive months.  Progress: 20% Short Term Goals:  Pt to verbally express understanding of the relationship between feelings of depression and their impact on thinking patterns and behaviors.  Pt to verbalize an understanding of the role that distorted thinking plays in creating fears, excessive worry, and ruminations.  Progress: 20% Target Date:   11/17/2024 Frequency:  Bi-weekly Modality:  Cognitive Behavioral Therapy Interventions by Therapist:  Therapist will use CBT, Mindfulness exercises, Coping skills and Referrals, as needed by client. Client has verbally approved this treatment plan.  Karie Kirks, [facility]

## 2024-01-18 ENCOUNTER — Ambulatory Visit (INDEPENDENT_AMBULATORY_CARE_PROVIDER_SITE_OTHER): Payer: Self-pay | Admitting: Family Medicine

## 2024-01-18 ENCOUNTER — Other Ambulatory Visit (INDEPENDENT_AMBULATORY_CARE_PROVIDER_SITE_OTHER): Payer: Self-pay | Admitting: Family Medicine

## 2024-01-18 DIAGNOSIS — E1169 Type 2 diabetes mellitus with other specified complication: Secondary | ICD-10-CM

## 2024-01-18 DIAGNOSIS — E559 Vitamin D deficiency, unspecified: Secondary | ICD-10-CM

## 2024-01-23 ENCOUNTER — Telehealth (INDEPENDENT_AMBULATORY_CARE_PROVIDER_SITE_OTHER): Payer: Self-pay | Admitting: Psychology

## 2024-01-23 DIAGNOSIS — F4323 Adjustment disorder with mixed anxiety and depressed mood: Secondary | ICD-10-CM

## 2024-01-23 DIAGNOSIS — F5089 Other specified eating disorder: Secondary | ICD-10-CM | POA: Diagnosis not present

## 2024-01-23 NOTE — Progress Notes (Signed)
  Office: [phone]  /  Fax: [phone]    Date: January 23, 2024  Appointment Start Time: 2:01pm Duration: 38 minutes Provider: Lawerance Cruel, Psy.D. Type of Session: Individual Therapy  Location of Patient: Home (private location) Location of Provider: Provider's Home (private office) Type of Contact: Telepsychological Visit via MyChart Video Visit  Session Content: Kenneth Green is a 58 y.o. male presenting for a follow-up appointment to address the previously established treatment goal of increasing coping skills.Today's appointment was a telepsychological visit. Kenneth Needle provided verbal consent for today's telepsychological appointment and he is aware he is responsible for securing confidentiality on his end of the session. Prior to proceeding with today's appointment, Kenneth Green's physical location at the time of this appointment was obtained as well a phone number he could be reached at in the event of technical difficulties. Kenneth Needle and this provider participated in today's telepsychological service.   This provider conducted a brief check-in. Kenneth Green shared things "have been pretty rough," noting his friend passed away. He acknowledged using "food for comfort," but noted he is back on track and lost five pounds. Notably, he discussed ongoing stressors, adding he is "trying to get to the point" where he is able to accept. He also identified he does "not feel like [he has] much value." Further explored and processed. Kenneth Green was engaged in thought challenging and reframing via discussing evidence for and evidence against the aforementioned thought. Overall, Kenneth Green was receptive to today's appointment as evidenced by openness to sharing and responsiveness to feedback.  Mental Status Examination:  Appearance: neat Behavior: appropriate to circumstances Mood: sad Affect: mood congruent Speech: WNL Eye Contact: appropriate Psychomotor Activity: WNL Gait: unable to assess Thought Process:  linear, logical, and goal directed and denies suicidal, homicidal, and self-harm ideation, plan and intent  Thought Content/Perception: no hallucinations, delusions, bizarre thinking or behavior endorsed or observed Orientation: AAOx4 Memory/Concentration: intact Insight: good Judgment: good  Interventions:  Conducted a brief chart review Conducted a risk assessment Provided empathic reflections and validation Provided positive reinforcement Employed supportive psychotherapy interventions to facilitate reduced distress and to improve coping skills with identified stressors Engaged patient in thought challenging/cognitive restructuring   DSM-5 Diagnosis(es):  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 Kenneth Green has demonstrated progress in his goal as evidenced by increased awareness of hunger patterns, increased awareness of triggers for emotional eating behaviors, and reduction in emotional eating behaviors . Kenneth Green also continues to demonstrate willingness to engage in learned skill(s).  Plan: The next appointment is scheduled for 02/13/2024 at 2:30pm, which will be via MyChart Video Visit. The next session will focus on working towards the established treatment goal. Kenneth Green will continue with his primary therapist.    Lawerance Cruel, PsyD

## 2024-01-25 ENCOUNTER — Encounter (INDEPENDENT_AMBULATORY_CARE_PROVIDER_SITE_OTHER): Payer: Self-pay | Admitting: Family Medicine

## 2024-01-25 ENCOUNTER — Other Ambulatory Visit (HOSPITAL_COMMUNITY): Payer: Self-pay

## 2024-01-25 DIAGNOSIS — E559 Vitamin D deficiency, unspecified: Secondary | ICD-10-CM

## 2024-01-25 DIAGNOSIS — E1169 Type 2 diabetes mellitus with other specified complication: Secondary | ICD-10-CM

## 2024-01-25 MED ORDER — MOUNJARO 7.5 MG/0.5ML ~~LOC~~ SOAJ
7.5000 mg | SUBCUTANEOUS | 0 refills | Status: DC
  Filled 2024-01-25: qty 2, 28d supply, fill #0

## 2024-01-25 MED ORDER — VITAMIN D (ERGOCALCIFEROL) 1.25 MG (50000 UNIT) PO CAPS
50000.0000 [IU] | ORAL_CAPSULE | ORAL | 0 refills | Status: DC
  Filled 2024-01-25: qty 4, 28d supply, fill #0

## 2024-01-25 NOTE — Telephone Encounter (Signed)
 LAST APPOINTMENT DATE: 12/06/2023 NEXT APPOINTMENT DATE: 02/20/2024   [place] - [facility] [address] [place] [address] Phone: [phone] Fax: [phone]  [id number] - [place], [place] - [address] [place] [address] Phone: [phone] Fax: [phone]  [facility] - [place], [place] - [address] [place] [address] Phone: [phone] Fax: [phone]  Patient is requesting a refill of the following medications: Requested Prescriptions   Pending Prescriptions Disp Refills   tirzepatide (MOUNJARO) 7.5 MG/0.5ML Pen 2 mL 0    Sig: Inject 7.5 mg into the skin once a week.   Vitamin D, Ergocalciferol, (DRISDOL) 1.25 MG (50000 UNIT) CAPS capsule 4 capsule 0    Sig: Take 1 capsule (50,000 Units total) by mouth every 7 (seven) days.    Date last filled: 12/06/2023 Previously prescribed by Dr Dalbert Garnet  Lab Results  Component Value Date   HGBA1C 5.8 (H) 10/04/2023   HGBA1C 6.0 (H) 02/22/2023   HGBA1C 5.4 09/13/2022   Lab Results  Component Value Date   MICROALBUR <0.7 05/07/2020   LDLCALC 140 (H) 10/04/2023   CREATININE 1.00 10/04/2023   Lab Results  Component Value Date   VD25OH 41.2 10/04/2023   VD25OH 33.6 02/22/2023   VD25OH 31.4 01/05/2023    BP Readings from Last 3 Encounters:  12/06/23 128/87  10/04/23 136/88  09/06/23 132/84

## 2024-01-26 ENCOUNTER — Other Ambulatory Visit (HOSPITAL_COMMUNITY): Payer: Self-pay

## 2024-01-31 ENCOUNTER — Ambulatory Visit (INDEPENDENT_AMBULATORY_CARE_PROVIDER_SITE_OTHER): Payer: Self-pay | Admitting: Psychology

## 2024-01-31 DIAGNOSIS — F4323 Adjustment disorder with mixed anxiety and depressed mood: Secondary | ICD-10-CM | POA: Diagnosis not present

## 2024-01-31 NOTE — Progress Notes (Signed)
 [facility] Counselor/Therapist Progress Note  [name], MRN: [id number],    Date: 01/31/2024  Time Spent: 50 mins  start time: 1400   end time 1450  Treatment Type: Individual Therapy  Reported Symptoms: Pt presented via Caregility video for follow up session, stating his understanding of the limits of virtual visits and granting consent for the session.  Pt stated he was in his home and no one else was present.  I shared with pt that I am in my office and no one else is present.  Mental Status Exam: Appearance:  Casual     Behavior: Appropriate  Motor: Normal  Speech/Language:  Clear and Coherent  Affect: Appropriate  Mood: normal  Thought process: normal  Thought content:   WNL  Sensory/Perceptual disturbances:   WNL  Orientation: oriented to person, place, and time/date  Attention: Good  Concentration: Good  Memory: WNL  Fund of knowledge:  Good  Insight:   Good  Judgment:  Good  Impulse Control: Good   Risk Assessment: Danger to Self:  No Self-injurious Behavior: No Danger to Others: No Duty to Warn:no Physical Aggression / Violence:No  Access to Firearms a concern: No  Gang Involvement:No   Subjective: Pt shares that "I have been doing OK.  We went to the eye doctor yesterday and they are concerned about the healing.  They have changed some medications and they hope that will aid the healing.  She still has a CT scan coming up and then an appt with the pulmonologist on 3/26 (Dr. Isaiah Serge)."  Work is going good, per pt.  Pt shares that the [employer] is this weekend and he is looking forward to that.  His daughter is going with him this weekend.  Pt continues to grieve the loss of his friend, Rosanne Ashing.  Encouraged pt to give himself grace as he goes through the weekend.  Pt shares that [name] is dealing pretty well with her health issues.  She continues to be appreciative of what pt is doing for her.  Pt did see the PA at  his back surgeon's office and they gave him more Toradol and steroids and felt that he was doing as well as they would hope.  Pt is doing some virtual PT for his back and it is going OK.  He has an appt with [facility] next week.  He shares, "their meal plan gets boring after a while and I also stress eat as well, at home and at work."  Pt shares that his mom, daughters and grand kids are all doing fine.  Pt shares his drinking is still being managed well and he feels good about that.  Pt shares that he went a month without Monjauro because he missed an appt and they would not refill the medication without an office visit.  Encouraged pt to continue with his self care activities and we will meet in 6 wks for a follow up session, due to my vacation.  Interventions: Cognitive Behavioral Therapy  Diagnosis:Adjustment disorder with mixed anxiety and depressed mood  Plan: Treatment Plan Strengths/Abilities:  Intelligent, Intuitive, Willing to participate in therapy Treatment Preferences:  Outpatient Individual Therapy Statement of Needs:  Patient is to use CBT, mindfulness and coping skills to help manage and/or decrease symptoms associated with their diagnosis. Symptoms:  Depressed/Irritable mood, worry, social withdrawal Problems Addressed:  Depressive thoughts, Sadness, Sleep issues, etc. Long Term Goals:  Pt to reduce overall level, frequency, and intensity of the  feelings of depression as evidenced by decreased irritability, negative self talk, and helpless feelings from 6 to 7 days/week to 0 to 1 days/week, per client report, for at least 3 consecutive months.  Progress: 20% Short Term Goals:  Pt to verbally express understanding of the relationship between feelings of depression and their impact on thinking patterns and behaviors.  Pt to verbalize an understanding of the role that distorted thinking plays in creating fears, excessive worry, and ruminations.  Progress: 20% Target Date:   11/17/2024 Frequency:  Bi-weekly Modality:  Cognitive Behavioral Therapy Interventions by Therapist:  Therapist will use CBT, Mindfulness exercises, Coping skills and Referrals, as needed by client. Client has verbally approved this treatment plan.  Karie Kirks, [facility]

## 2024-02-13 ENCOUNTER — Telehealth (INDEPENDENT_AMBULATORY_CARE_PROVIDER_SITE_OTHER): Admitting: Psychology

## 2024-02-13 DIAGNOSIS — F4323 Adjustment disorder with mixed anxiety and depressed mood: Secondary | ICD-10-CM

## 2024-02-13 DIAGNOSIS — F5089 Other specified eating disorder: Secondary | ICD-10-CM | POA: Diagnosis not present

## 2024-02-13 NOTE — Progress Notes (Signed)
  Office: [phone]  /  Fax: [phone]    Date: February 13, 2024  Appointment Start Time: 2:36pm Duration: 33 minutes Provider: Lawerance Cruel, Psy.D. Type of Session: Individual Therapy  Location of Patient: Home (private location) Location of Provider: Provider's Home (private office) Type of Contact: Telepsychological Visit via MyChart Video Visit  Session Content: Kenneth Green is a 58 y.o. male presenting for a follow-up appointment to address the previously established treatment goal of increasing coping skills.Today's appointment was a telepsychological visit. Kenneth Green provided verbal consent for today's telepsychological appointment and he is aware he is responsible for securing confidentiality on his end of the session. Prior to proceeding with today's appointment, Kenneth Green's physical location at the time of this appointment was obtained as well a phone number he could be reached at in the event of technical difficulties. Kenneth Green and this provider participated in today's telepsychological service.   This provider conducted a brief check-in. Kenneth Green shared his wife is in the hospital. Associated thoughts and feelings were processed. He described feeling overwhelmed about "everything [he has] to do." This provider discussed engaging in a "brain dump" and prioritizing tasks. Kenneth Green was observed writing. Overall, Kenneth Green was receptive to today's appointment as evidenced by openness to sharing, responsiveness to feedback, and willingness to implement discussed strategies .  Mental Status Examination:  Appearance: neat Behavior: appropriate to circumstances Mood: sad Affect: mood congruent Speech: WNL Eye Contact: appropriate Psychomotor Activity: WNL Gait: unable to assess Thought Process: linear, logical, and goal directed and no evidence or endorsement of suicidal, homicidal, and self-harm ideation, plan and intent  Thought Content/Perception: no hallucinations, delusions, bizarre  thinking or behavior endorsed or observed Orientation: AAOx4 Memory/Concentration: intact Insight: good Judgment: good  Interventions:  Conducted a brief chart review Provided empathic reflections and validation Employed supportive psychotherapy interventions to facilitate reduced distress and to improve coping skills with identified stressors Engaged patient in problem solving  DSM-5 Diagnosis(es):  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 Kenneth Green has demonstrated progress in his goal as evidenced by increased awareness of hunger patterns, increased awareness of triggers for emotional eating behaviors, and reduction in emotional eating behaviors. Kenneth Green also continues to demonstrate willingness to engage in learned skill(s).   Plan: The next appointment is scheduled for 02/21/2024 at 9am, which will be via MyChart Video Visit. The next session will focus on working towards the established treatment goal. Kenneth Green will continue with his primary therapist and try to increase frequency of appointments.    Lawerance Cruel, PsyD

## 2024-02-16 ENCOUNTER — Encounter (INDEPENDENT_AMBULATORY_CARE_PROVIDER_SITE_OTHER): Payer: Self-pay | Admitting: Family Medicine

## 2024-02-17 ENCOUNTER — Other Ambulatory Visit (INDEPENDENT_AMBULATORY_CARE_PROVIDER_SITE_OTHER): Payer: Self-pay | Admitting: Family Medicine

## 2024-02-17 DIAGNOSIS — E1169 Type 2 diabetes mellitus with other specified complication: Secondary | ICD-10-CM

## 2024-02-20 ENCOUNTER — Encounter (INDEPENDENT_AMBULATORY_CARE_PROVIDER_SITE_OTHER): Payer: Self-pay

## 2024-02-20 ENCOUNTER — Ambulatory Visit (INDEPENDENT_AMBULATORY_CARE_PROVIDER_SITE_OTHER): Payer: Self-pay | Admitting: Family Medicine

## 2024-02-21 ENCOUNTER — Telehealth (INDEPENDENT_AMBULATORY_CARE_PROVIDER_SITE_OTHER): Payer: Self-pay | Admitting: Psychology

## 2024-02-21 ENCOUNTER — Telehealth (INDEPENDENT_AMBULATORY_CARE_PROVIDER_SITE_OTHER): Admitting: Psychology

## 2024-02-21 NOTE — Telephone Encounter (Signed)
  Office: [phone]  /  Fax: [phone]  Date of Call: February 21, 2024  Time of Call: 10:46am Duration of Call: ~3 minute(s) Provider: Lawerance Cruel, PsyD  CONTENT: This provider called Kenneth Green to check-in given recent events and his appointment for today being rescheduled to later this month. Kenneth Green shared that his wife passed away last night and that he was "numb at the moment." He shared his daughter was present and she rescheduled today's appointment for him. This provider offered an appointment for 4/7. Kenneth Green was receptive. No evidence or endorsement of any safety concerns. All questions/concerns addressed.   PLAN:  Kenneth Green is scheduled for an appointment on 02/27/2024 at 11:30am via MyChart Video Visit.

## 2024-02-24 ENCOUNTER — Other Ambulatory Visit (HOSPITAL_COMMUNITY): Payer: Self-pay

## 2024-02-25 ENCOUNTER — Other Ambulatory Visit (HOSPITAL_COMMUNITY): Payer: Self-pay

## 2024-02-27 ENCOUNTER — Encounter (INDEPENDENT_AMBULATORY_CARE_PROVIDER_SITE_OTHER): Payer: Self-pay | Admitting: Family Medicine

## 2024-02-27 ENCOUNTER — Other Ambulatory Visit (HOSPITAL_COMMUNITY): Payer: Self-pay

## 2024-02-27 ENCOUNTER — Ambulatory Visit (INDEPENDENT_AMBULATORY_CARE_PROVIDER_SITE_OTHER): Admitting: Family Medicine

## 2024-02-27 ENCOUNTER — Telehealth (INDEPENDENT_AMBULATORY_CARE_PROVIDER_SITE_OTHER): Admitting: Psychology

## 2024-02-27 VITALS — BP 135/79 | HR 72 | Temp 98.6°F | Ht 69.0 in | Wt 300.0 lb

## 2024-02-27 DIAGNOSIS — E669 Obesity, unspecified: Secondary | ICD-10-CM

## 2024-02-27 DIAGNOSIS — E1169 Type 2 diabetes mellitus with other specified complication: Secondary | ICD-10-CM

## 2024-02-27 DIAGNOSIS — F5089 Other specified eating disorder: Secondary | ICD-10-CM | POA: Diagnosis not present

## 2024-02-27 DIAGNOSIS — E119 Type 2 diabetes mellitus without complications: Secondary | ICD-10-CM

## 2024-02-27 DIAGNOSIS — F4320 Adjustment disorder, unspecified: Secondary | ICD-10-CM

## 2024-02-27 DIAGNOSIS — F4323 Adjustment disorder with mixed anxiety and depressed mood: Secondary | ICD-10-CM | POA: Diagnosis not present

## 2024-02-27 DIAGNOSIS — E559 Vitamin D deficiency, unspecified: Secondary | ICD-10-CM | POA: Diagnosis not present

## 2024-02-27 DIAGNOSIS — Z7985 Long-term (current) use of injectable non-insulin antidiabetic drugs: Secondary | ICD-10-CM

## 2024-02-27 DIAGNOSIS — M1612 Unilateral primary osteoarthritis, left hip: Secondary | ICD-10-CM

## 2024-02-27 DIAGNOSIS — Z6841 Body Mass Index (BMI) 40.0 and over, adult: Secondary | ICD-10-CM

## 2024-02-27 MED ORDER — MOUNJARO 7.5 MG/0.5ML ~~LOC~~ SOAJ
7.5000 mg | SUBCUTANEOUS | 0 refills | Status: DC
  Filled 2024-02-27: qty 2, 28d supply, fill #0

## 2024-02-27 MED ORDER — VITAMIN D (ERGOCALCIFEROL) 1.25 MG (50000 UNIT) PO CAPS
50000.0000 [IU] | ORAL_CAPSULE | ORAL | 0 refills | Status: DC
  Filled 2024-02-27: qty 4, 28d supply, fill #0

## 2024-02-27 NOTE — Progress Notes (Signed)
  Office: [phone]  /  Fax: [phone]    Date: February 27, 2024  Appointment Start Time: 11:30am Duration: 34 minutes Provider: Lawerance Cruel, Psy.D. Type of Session: Individual Therapy  Location of Patient: Home (private location) Location of Provider: Provider's Home (private office) Type of Contact: Telepsychological Visit via MyChart Video Visit  Session Content: Kenneth Green is a 58 y.o. male presenting for a follow-up appointment to address the previously established treatment goal of increasing coping skills.Today's appointment was a telepsychological visit. Kenneth Green provided verbal consent for today's telepsychological appointment and he is aware he is responsible for securing confidentiality on his end of the session. Prior to proceeding with today's appointment, Kenneth Green's physical location at the time of this appointment was obtained as well a phone number he could be reached at in the event of technical difficulties. Kenneth Green and this provider participated in today's telepsychological service.   This provider conducted a brief check-in. Kenneth Green shared about his appointment with Dr. Dalbert Garnet, noting "we are going to re-start on the weight loss program." Associated thoughts and feelings were processed. He further shared, "One of the toughest things for me right now is adjusting to moving forward." Associated thoughts and feelings were processed. He continues to feel he is not processing loss "the right way." Remainder of today's appointment focused on grief and coping via writing a goodbye letter. Overall, Kenneth Green was receptive to today's appointment as evidenced by openness to sharing and responsiveness to feedback.  Mental Status Examination:  Appearance: neat Behavior: appropriate to circumstances Mood: sad Affect: mood congruent Speech: WNL Eye Contact: appropriate Psychomotor Activity: WNL Gait: unable to assess Thought Process: linear, logical, and goal directed and no evidence  or endorsement of suicidal, homicidal, and self-harm ideation, plan and intent  Thought Content/Perception: no hallucinations, delusions, bizarre thinking or behavior endorsed or observed Orientation: AAOx4 Memory/Concentration: intact Insight: good Judgment: good  Interventions:  Conducted a brief chart review Provided empathic reflections and validation Provided positive reinforcement Employed supportive psychotherapy interventions to facilitate reduced distress and to improve coping skills with identified stressors Additional psychoeducation provided regarding grief  DSM-5 Diagnosis(es):  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 Kenneth Green has demonstrated progress in his goal as evidenced by increased awareness of hunger patterns, increased awareness of triggers for emotional eating behaviors, and reduction in emotional eating behaviors. Sehaj also continues to demonstrate willingness to engage in learned skill(s).   Plan: Given Kenneth Green's loss, the next appointment is scheduled for 03/13/2024 at 12:30pm, which will be via MyChart Video Visit. The next session will focus on working towards the established treatment goal and processing grief. Kenneth Green will also continue with his primary therapist.    Lawerance Cruel, PsyD

## 2024-02-27 NOTE — Progress Notes (Signed)
 Office: [phone]  /  Fax: [phone]  WEIGHT SUMMARY AND BIOMETRICS  Anthropometric Measurements Height: 5\' 9"  (1.753 m) Weight: 300 lb (136.1 kg) BMI (Calculated): 44.28 Weight at Last Visit: 297 lb Weight Lost Since Last Visit: 0 Weight Gained Since Last Visit: 3 lb Starting Weight: 308 Total Weight Loss (lbs): 8 lb (3.629 kg)   Body Composition  Body Fat %: 38.6 % Fat Mass (lbs): 115.8 lbs Muscle Mass (lbs): 175.4 lbs Total Body Water (lbs): 129 lbs Visceral Fat Rating : 26   Other Clinical Data Fasting: yes Labs: no Today's Visit #: 12    Chief Complaint: OBESITY    History of Present Illness The patient presents with obesity treatment follow-up.  He has gained three pounds over the past three months, attributed to the stress and lifestyle changes following his wife's hospitalization and recent passing. He is interested in resuming a structured weight management program, specifically 'Plan Three', which he previously found effective. This plan allows for flexibility in food choices while meeting nutritional criteria.  His recent dietary habits have been suboptimal, often relying on hospital food and vending machine snacks during his wife's hospitalization. He is now focusing on improving his nutrition by planning meals and ensuring the availability of healthy foods at home.  He is currently taking Zoloft, Wellbutrin, Lyrica for nerve pain, vitamin D, and Mounjaro 7.5 mg. He missed a dose of Mounjaro last Friday due to recent events but did not experience any gastrointestinal issues.  Recent laboratory tests from [facility] indicate a well-controlled A1c of 5.9%, normal liver function, and some cholesterol concerns. He was fasting for these tests.  He is coping with the recent loss of his wife, which has affected his ability to adhere to his obesity treatment plan. He took a week off work and is contemplating additional time off. His emotional support animal, a  Location manager, and his daughters, who has been organizing the [employer] and spending time with him, provide support.      PHYSICAL EXAM:  Blood pressure 135/79, pulse 72, temperature 98.6 F (37 C), height 5\' 9"  (1.753 m), weight 300 lb (136.1 kg), SpO2 97%. Body mass index is 44.3 kg/m.  DIAGNOSTIC DATA REVIEWED:  BMET    Component Value Date/Time   NA 139 10/04/2023 1612   K 3.6 10/04/2023 1612   CL 101 10/04/2023 1612   CO2 24 10/04/2023 1612   GLUCOSE 90 10/04/2023 1612   GLUCOSE 106 (H) 06/15/2016 1700   BUN 14 10/04/2023 1612   CREATININE 1.00 10/04/2023 1612   CALCIUM 10.1 10/04/2023 1612   GFRNONAA 74 05/11/2021 0000   GFRAA 91 01/15/2021 1322   Lab Results  Component Value Date   HGBA1C 5.8 (H) 10/04/2023   HGBA1C 5.9 (H) 04/10/2019   Lab Results  Component Value Date   INSULIN 20.9 10/04/2023   INSULIN 30.9 (H) 04/10/2019   Lab Results  Component Value Date   TSH 1.020 02/22/2023   CBC    Component Value Date/Time   WBC 5.1 02/22/2023 1540   WBC 7.7 06/15/2016 1700   RBC 5.03 02/22/2023 1540   RBC 4.87 06/15/2016 1700   HGB 14.2 02/22/2023 1540   HCT 43.3 02/22/2023 1540   PLT 205 02/22/2023 1540   MCV 86 02/22/2023 1540   MCH 28.2 02/22/2023 1540   MCH 29.0 06/15/2016 1700   MCHC 32.8 02/22/2023 1540   MCHC 34.6 06/15/2016 1700   RDW 13.3 02/22/2023 1540   Iron Studies No results  found for: "IRON", "TIBC", "FERRITIN", "IRONPCTSAT" Lipid Panel     Component Value Date/Time   CHOL 213 (H) 10/04/2023 1612   TRIG 132 10/04/2023 1612   HDL 49 10/04/2023 1612   LDLCALC 140 (H) 10/04/2023 1612   Hepatic Function Panel     Component Value Date/Time   PROT 7.3 10/04/2023 1612   ALBUMIN 4.5 10/04/2023 1612   AST 31 10/04/2023 1612   ALT 37 10/04/2023 1612   ALKPHOS 73 10/04/2023 1612   BILITOT 0.4 10/04/2023 1612      Component Value Date/Time   TSH 1.020 02/22/2023 1540   Nutritional Lab Results  Component Value Date    VD25OH 41.2 10/04/2023   VD25OH 33.6 02/22/2023   VD25OH 31.4 01/05/2023     Assessment and Plan Assessment & Plan Obesity Weight gain of three pounds over the last three months, likely due to stress and lifestyle changes following his wife's illness and passing. He seeks to restart a structured dietary program to manage weight. - Provide Plan Three dietary program and grocery list - Encourage adherence to the dietary plan without focusing on meal timing - Advise against being overly critical of dietary indiscretions - Continue Mounjaro 7.5 mg with regular dosing  Type 2 Diabetes Mellitus Well-controlled with an A1c of 5.9%. He has missed doses of Mounjaro recently due to personal circumstances. Regular dosing is necessary to evaluate effectiveness over a month. - Continue Mounjaro 7.5 mg with regular dosing - Monitor blood glucose levels regularly  Hip Pain Recent onset of left-sided hip pain, primarily anterior, exacerbated by movements such as getting into a car. Suspected arthritis. Physical therapy may assist with movement strategies. Further evaluation by orthopedics is planned. - Refer to orthopedics for further evaluation - Consider physical therapy for pain management and movement strategies -Continue weight loss efforts to decrease pressure on his weight bearing joints  Grieving Reaction Experiencing a grieving reaction following the recent passing of his wife. Managing with family support and an emotional support animal. He has taken time off work and is considering further time off as needed. - Continue Zoloft and Wellbutrin - Encourage use of emotional support animal for comfort - Offer support and encourage communication with myself as needed  Vitamin D Deficiency On vitamin D supplementation. No recent lab results discussed regarding vitamin D levels. - Continue vitamin D supplementation    He was informed of the importance of frequent follow up visits to  maximize his success with intensive lifestyle modifications for his multiple health conditions.    Quillian Quince, MD

## 2024-02-28 ENCOUNTER — Other Ambulatory Visit: Payer: Self-pay | Admitting: Behavioral Health

## 2024-02-28 DIAGNOSIS — F411 Generalized anxiety disorder: Secondary | ICD-10-CM

## 2024-02-28 DIAGNOSIS — F331 Major depressive disorder, recurrent, moderate: Secondary | ICD-10-CM

## 2024-03-13 ENCOUNTER — Telehealth (INDEPENDENT_AMBULATORY_CARE_PROVIDER_SITE_OTHER): Admitting: Psychology

## 2024-03-13 DIAGNOSIS — F5089 Other specified eating disorder: Secondary | ICD-10-CM | POA: Diagnosis not present

## 2024-03-13 DIAGNOSIS — F4323 Adjustment disorder with mixed anxiety and depressed mood: Secondary | ICD-10-CM

## 2024-03-13 NOTE — Progress Notes (Signed)
  Office: [phone]  /  Fax: [phone]    Date: March 13, 2024  Appointment Start Time: 12:30pm Duration: 27 minutes Provider: Catherene Close, Psy.D. Type of Session: Individual Therapy  Location of Patient: Home (private location) Location of Provider: Provider's Home (private office) Type of Contact: Telepsychological Visit via MyChart Video Visit  Session Content: Kenneth Green is a 58 y.o. male presenting for a follow-up appointment to address the previously established treatment goal of increasing coping skills.Today's appointment was a telepsychological visit. Kenneth Green provided verbal consent for today's telepsychological appointment and he is aware he is responsible for securing confidentiality on his end of the session. Prior to proceeding with today's appointment, Kenneth Green's physical location at the time of this appointment was obtained as well a phone number he could be reached at in the event of technical difficulties. Kenneth Green and this provider participated in today's telepsychological service.   This provider conducted a brief check-in. Kenneth Green shared, "I feel like I've lost control of eating." Further explored and processed. It was reflected he is not eating enough protein. Kenneth Green was engaged in problem solving. Additionally, this provider assisted Kenneth Green in setting a SMART goal. Reviewed SMART goals and established the following: Kenneth Green will eat dinner congruent to his prescribed structured meal plan at least 4 out of 7 days between now and the next appointment with this provider. Overall, Kenneth Green was receptive to today's appointment as evidenced by openness to sharing, responsiveness to feedback, and willingness to work toward the established SMART goal.  Mental Status Examination:  Appearance: neat Behavior: appropriate to circumstances Mood: sad Affect: mood congruent Speech: WNL Eye Contact: appropriate Psychomotor Activity: WNL Gait: unable to assess Thought Process:  linear, logical, and goal directed and no evidence or endorsement of suicidal, homicidal, and self-harm ideation, plan and intent  Thought Content/Perception: no hallucinations, delusions, bizarre thinking or behavior endorsed or observed Orientation: AAOx4 Memory/Concentration: intact Insight: fair Judgment: fair  Interventions:  Conducted a brief chart review Provided empathic reflections and validation Employed supportive psychotherapy interventions to facilitate reduced distress and to improve coping skills with identified stressors Engaged patient in goal setting Engaged patient in problem solving Psychoeducation provided regarding SMART goals  DSM-5 Diagnosis(es):  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 [name] has demonstrated progress in his goal as evidenced by increased awareness of hunger patterns, increased awareness of triggers for emotional eating behaviors, and reduction in emotional eating behaviors. Kenneth Green also continues to demonstrate willingness to engage in learned skill(s).   Plan: The next appointment is scheduled for 03/19/2024 at 2pm, which will be via MyChart Video Visit. The next session will focus on working towards the established treatment goal. Kenneth Green will continue with his primary therapist.    Catherene Close, PsyD

## 2024-03-15 ENCOUNTER — Ambulatory Visit (INDEPENDENT_AMBULATORY_CARE_PROVIDER_SITE_OTHER): Admitting: Psychology

## 2024-03-15 DIAGNOSIS — F4323 Adjustment disorder with mixed anxiety and depressed mood: Secondary | ICD-10-CM

## 2024-03-15 NOTE — Progress Notes (Signed)
 [facility] Counselor/Therapist Progress Note  [name], MRN: [id number],    Date: 03/15/2024  Time Spent: 60 mins  start time: 1300   end time 1400  Treatment Type: Individual Therapy  Reported Symptoms: Pt presented via Caregility video for follow up session, stating his understanding of the limits of virtual visits and granting consent for the session.  Pt stated he was in his home and no one else was present.  I shared with pt that I am in my office and no one else is present.  Mental Status Exam: Appearance:  Casual     Behavior: Appropriate  Motor: Normal  Speech/Language:  Clear and Coherent  Affect: Appropriate  Mood: normal  Thought process: normal  Thought content:   WNL  Sensory/Perceptual disturbances:   WNL  Orientation: oriented to person, place, and time/date  Attention: Good  Concentration: Good  Memory: WNL  Fund of knowledge:  Good  Insight:   Good  Judgment:  Good  Impulse Control: Good   Risk Assessment: Danger to Self:  No Self-injurious Behavior: No Danger to Others: No Duty to Warn:no Physical Aggression / Violence:No  Access to Firearms a concern: No  Gang Involvement:No   Subjective: Pt shares that "I have been doing OK since [name]'s passing at the beginning of April.  We had a "Celebration of Life" the Sunday after her passing.  It was a very nice event.  I think I am doing OK emotionally; my left leg is really causing me problems and my hips are causing pain too."  Pt shares that he was out of work for a week and that work has been supportive of him.  Talked with pt about what he is doing with his extra time that he has after [name]'s passing.  He is beginning to think about how to share [name]'s belongings (clothes, crafting supplies, fabric, leather, etc.).  He is talking to [name]'s sister and his daughters about how to begin the process of clearing out that stuff; "Carles Cheadle was a pack rat and never wanted to throw  anything away."  He is even considering having an Production assistant, radio to get rid of things.  Pt shares that his family continues to do well (daughters, mom, grand kids).  Pt shares that he continues to struggle with his emotional eating issues.  Encouraged pt co think about how much he did for [name] that benefited her; think about all that he does for others in his life.  Shared with pt that I believe he would be more successful with his eating plan if he could give himself more credit for the good work he does in his life.  Pt shares his drinking is still being managed well and he feels good about that.  Encouraged pt to continue with his self care activities and we will meet in 3 wks for a follow up session.  Interventions: Cognitive Behavioral Therapy  Diagnosis:Adjustment disorder with mixed anxiety and depressed mood  Plan: Treatment Plan Strengths/Abilities:  Intelligent, Intuitive, Willing to participate in therapy Treatment Preferences:  Outpatient Individual Therapy Statement of Needs:  Patient is to use CBT, mindfulness and coping skills to help manage and/or decrease symptoms associated with their diagnosis. Symptoms:  Depressed/Irritable mood, worry, social withdrawal Problems Addressed:  Depressive thoughts, Sadness, Sleep issues, etc. Long Term Goals:  Pt to reduce overall level, frequency, and intensity of the feelings of depression as evidenced by decreased irritability, negative self talk, and helpless feelings from 6  to 7 days/week to 0 to 1 days/week, per client report, for at least 3 consecutive months.  Progress: 20% Short Term Goals:  Pt to verbally express understanding of the relationship between feelings of depression and their impact on thinking patterns and behaviors.  Pt to verbalize an understanding of the role that distorted thinking plays in creating fears, excessive worry, and ruminations.  Progress: 20% Target Date:  11/17/2024 Frequency:  Bi-weekly Modality:  Cognitive  Behavioral Therapy Interventions by Therapist:  Therapist will use CBT, Mindfulness exercises, Coping skills and Referrals, as needed by client. Client has verbally approved this treatment plan.  Jhonny Moss, [facility]

## 2024-03-19 ENCOUNTER — Telehealth (INDEPENDENT_AMBULATORY_CARE_PROVIDER_SITE_OTHER): Payer: Self-pay | Admitting: Psychology

## 2024-03-19 DIAGNOSIS — F5089 Other specified eating disorder: Secondary | ICD-10-CM

## 2024-03-19 DIAGNOSIS — F4323 Adjustment disorder with mixed anxiety and depressed mood: Secondary | ICD-10-CM

## 2024-03-19 NOTE — Progress Notes (Signed)
  Office: [phone]  /  Fax: [phone]    Date: March 19, 2024  Appointment Start Time: 2:00pm Duration: 33 minutes Provider: Catherene Close, Psy.D. Type of Session: Individual Therapy  Location of Patient: Parked in car (addressed obtained; private location) Location of Provider: Provider's Home (private office) Type of Contact: Telepsychological Visit via MyChart Video Visit  Session Content: Kenneth Green is a 58 y.o. male presenting for a follow-up appointment to address the previously established treatment goal of increasing coping skills.Today's appointment was a telepsychological visit. [name] provided verbal consent for today's telepsychological appointment and he is aware he is responsible for securing confidentiality on his end of the session. Prior to proceeding with today's appointment, [name]'s physical location at the time of this appointment was obtained as well a phone number he could be reached at in the event of technical difficulties. [name] and this provider participated in today's telepsychological service.   This provider conducted a brief check-in. [name] reported he is focused on helping his daughter with her car. He further shared he is enjoying his deck in the evenings and after work. Notably, [name] reported issues with his leg resulting in an MRI appointment, noting it is impacting his mobility. Regarding grief, he stated, "I feel like I didn't do something right." [name] acknowledged feeling he should have been able to save his wife. Further explored and processed as well as engaged [name] in thought challenging. Reviewed radical acceptance and discussed using the ACCEPTS coping strategy. Overall, [name] was receptive to today's appointment as evidenced by openness to sharing, responsiveness to feedback, and willingness to implement discussed strategies .  Mental Status Examination:  Appearance: neat Behavior: appropriate to circumstances Mood: sad Affect:  mood congruent Speech: WNL Eye Contact: appropriate Psychomotor Activity: WNL Gait: unable to assess Thought Process: linear, logical, and goal directed and no evidence or endorsement of suicidal, homicidal, and self-harm ideation, plan and intent  Thought Content/Perception: no hallucinations, delusions, bizarre thinking or behavior endorsed or observed Orientation: AAOx4 Memory/Concentration: intact Insight: fair Judgment: fair  Interventions:  Conducted a brief chart review Provided empathic reflections and validation Employed supportive psychotherapy interventions to facilitate reduced distress and to improve coping skills with identified stressors Reviewed radical acceptance Engaged pt in thought challenging   DSM-5 Diagnosis(es):  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 [name] has demonstrated progress in his goal as evidenced by increased awareness of hunger patterns, increased awareness of triggers for emotional eating behaviors, and reduction in emotional eating behaviors. [name] also continues to demonstrate willingness to engage in learned skill(s).   Plan: The next appointment is scheduled for 04/02/2024 at 8:30am, which will be via MyChart Video Visit. The next session will focus on working towards the established treatment goal. [name] will continue with his primary therapist.    Catherene Close, PsyD

## 2024-03-22 ENCOUNTER — Ambulatory Visit (INDEPENDENT_AMBULATORY_CARE_PROVIDER_SITE_OTHER): Payer: Self-pay | Admitting: Family Medicine

## 2024-03-22 ENCOUNTER — Encounter (INDEPENDENT_AMBULATORY_CARE_PROVIDER_SITE_OTHER): Payer: Self-pay | Admitting: Family Medicine

## 2024-03-22 ENCOUNTER — Other Ambulatory Visit (HOSPITAL_COMMUNITY): Payer: Self-pay

## 2024-03-22 VITALS — BP 129/84 | HR 89 | Temp 97.8°F | Ht 69.0 in | Wt 299.0 lb

## 2024-03-22 DIAGNOSIS — F4321 Adjustment disorder with depressed mood: Secondary | ICD-10-CM | POA: Diagnosis not present

## 2024-03-22 DIAGNOSIS — E1169 Type 2 diabetes mellitus with other specified complication: Secondary | ICD-10-CM

## 2024-03-22 DIAGNOSIS — E669 Obesity, unspecified: Secondary | ICD-10-CM

## 2024-03-22 DIAGNOSIS — R29898 Other symptoms and signs involving the musculoskeletal system: Secondary | ICD-10-CM

## 2024-03-22 DIAGNOSIS — E119 Type 2 diabetes mellitus without complications: Secondary | ICD-10-CM

## 2024-03-22 DIAGNOSIS — Z7985 Long-term (current) use of injectable non-insulin antidiabetic drugs: Secondary | ICD-10-CM

## 2024-03-22 DIAGNOSIS — E559 Vitamin D deficiency, unspecified: Secondary | ICD-10-CM

## 2024-03-22 DIAGNOSIS — Z6841 Body Mass Index (BMI) 40.0 and over, adult: Secondary | ICD-10-CM

## 2024-03-22 DIAGNOSIS — I10 Essential (primary) hypertension: Secondary | ICD-10-CM

## 2024-03-22 MED ORDER — MOUNJARO 7.5 MG/0.5ML ~~LOC~~ SOAJ
7.5000 mg | SUBCUTANEOUS | 0 refills | Status: DC
  Filled 2024-03-22: qty 2, 28d supply, fill #0

## 2024-03-22 MED ORDER — VITAMIN D (ERGOCALCIFEROL) 1.25 MG (50000 UNIT) PO CAPS
50000.0000 [IU] | ORAL_CAPSULE | ORAL | 0 refills | Status: DC
  Filled 2024-03-22: qty 4, 28d supply, fill #0

## 2024-03-22 NOTE — Progress Notes (Unsigned)
 Office: [phone]  /  Fax: [phone]  WEIGHT SUMMARY AND BIOMETRICS  Anthropometric Measurements Height: 5\' 9"  (1.753 m) Weight: 299 lb (135.6 kg) BMI (Calculated): 44.13 Weight at Last Visit: 300lb Weight Lost Since Last Visit: 1lb Weight Gained Since Last Visit: 0 Starting Weight: 308lb Total Weight Loss (lbs): 9 lb (4.082 kg)   Body Composition  Body Fat %: 39.1 % Fat Mass (lbs): 117 lbs Muscle Mass (lbs): 173.2 lbs Total Body Water (lbs): 127.8 lbs Visceral Fat Rating : 27   Other Clinical Data Fasting: no Labs: no Today's Visit #: 13 Starting Date: 04/10/19    Chief Complaint: OBESITY    History of Present Illness Kenneth Green "Kenneth Green" is a 58 year old male with obesity, type 2 diabetes, and hypertension who presents for obesity treatment plan and progress assessment.  He is adhering to the category three eating plan about thirty percent of the time and engages in walking for exercise for forty-five minutes most days of the week. He has lost one pound in the last month since his last visit. He is seeing his own therapist and consulting with a bariatric psychologist to address grief and emotional eating behaviors.  He has a history of vitamin D  deficiency and is taking prescription vitamin D  50,000 international units weekly.  His type 2 diabetes is well-controlled with a recent hemoglobin A1c of 5.8. He is on Mounjaro  for this condition and requests a refill. No stomach upset with Mounjaro  and he feels it is helping with hunger control.  He has a history of hypertension and is on amlodipine, benazepril , and hydrochlorothiazide . His blood pressure is controlled at 129/84. He continues to work on diet, exercise, and weight loss to help improve blood pressure with the goal of decreasing the need for medications.  He experiences ongoing leg pain and describes a sensation of losing control of his legs, particularly in the thighs and mainly the left leg. He  has had an injection in his hip previously which seemed to help. He mentions difficulty with exercises that require lying on his back due to low back pain.  He is grieving the recent loss of his wife, which has been a significant adjustment. He spends more time alone and engages in activities like gardening and sitting on the deck. He has two daughters, two grandkids, his mother, and siblings living locally who are helping him.      PHYSICAL EXAM:  Blood pressure 129/84, pulse 89, temperature 97.8 F (36.6 C), height 5\' 9"  (1.753 m), weight 299 lb (135.6 kg), SpO2 97%. Body mass index is 44.15 kg/m.  DIAGNOSTIC DATA REVIEWED:  BMET    Component Value Date/Time   NA 139 10/04/2023 1612   K 3.6 10/04/2023 1612   CL 101 10/04/2023 1612   CO2 24 10/04/2023 1612   GLUCOSE 90 10/04/2023 1612   GLUCOSE 106 (H) 06/15/2016 1700   BUN 14 10/04/2023 1612   CREATININE 1.00 10/04/2023 1612   CALCIUM 10.1 10/04/2023 1612   GFRNONAA 74 05/11/2021 0000   GFRAA 91 01/15/2021 1322   Lab Results  Component Value Date   HGBA1C 5.8 (H) 10/04/2023   HGBA1C 5.9 (H) 04/10/2019   Lab Results  Component Value Date   INSULIN  20.9 10/04/2023   INSULIN  30.9 (H) 04/10/2019   Lab Results  Component Value Date   TSH 1.020 02/22/2023   CBC    Component Value Date/Time   WBC 5.1 02/22/2023 1540   WBC 7.7 06/15/2016 1700  RBC 5.03 02/22/2023 1540   RBC 4.87 06/15/2016 1700   HGB 14.2 02/22/2023 1540   HCT 43.3 02/22/2023 1540   PLT 205 02/22/2023 1540   MCV 86 02/22/2023 1540   MCH 28.2 02/22/2023 1540   MCH 29.0 06/15/2016 1700   MCHC 32.8 02/22/2023 1540   MCHC 34.6 06/15/2016 1700   RDW 13.3 02/22/2023 1540   Iron Studies No results found for: "IRON", "TIBC", "FERRITIN", "IRONPCTSAT" Lipid Panel     Component Value Date/Time   CHOL 213 (H) 10/04/2023 1612   TRIG 132 10/04/2023 1612   HDL 49 10/04/2023 1612   LDLCALC 140 (H) 10/04/2023 1612   Hepatic Function Panel      Component Value Date/Time   PROT 7.3 10/04/2023 1612   ALBUMIN 4.5 10/04/2023 1612   AST 31 10/04/2023 1612   ALT 37 10/04/2023 1612   ALKPHOS 73 10/04/2023 1612   BILITOT 0.4 10/04/2023 1612      Component Value Date/Time   TSH 1.020 02/22/2023 1540   Nutritional Lab Results  Component Value Date   VD25OH 41.2 10/04/2023   VD25OH 33.6 02/22/2023   VD25OH 31.4 01/05/2023     Assessment and Plan Assessment & Plan Leg pain and weakness He reports leg pain and a sensation of losing control of his legs, primarily in the left thigh. Exercises provided by the orthopedic specialist are difficult due to back pain. He is exploring alternative MRI locations to expedite the process. - Await MRI scheduling to assess for nerve compression - Consider alternative MRI locations to expedite scheduling - Avoid exercises that exacerbate back pain - Perform back stretch exercises as demonstrated  Obesity Obesity management is ongoing. He follows the category three eating plan about 30% of the time and walks for exercise 45 minutes most days of the week. He has lost one pound in the last month. Emotional eating behaviors are being addressed with the help of a therapist and a bariatric psychologist. He is working on improving diet and exercise to potentially reduce the need for hypertension medications. He has requested more frequent visits to help stay on track and accountable. - Continue category three eating plan - Continue walking for exercise 45 minutes most days of the week - Continue therapy with bariatric psychologist for emotional eating behaviors - Schedule more frequent visits to help stay on track and accountable  Hypertension Hypertension is controlled with a blood pressure reading of 129/84 mmHg. He is on amlodipine, benazepril , and hydrochlorothiazide . He is motivated to improve diet and exercise to potentially decrease the need for medications. - Continue current antihypertensive  medications: amlodipine, benazepril , and hydrochlorothiazide   Type 2 diabetes mellitus Type 2 diabetes is well-controlled with a recent hemoglobin A1c of 5.8. He is on Mounjaro  and reports no stomach upset. He feels the medication is helping with hunger control. He prefers to maintain the current dosage for now. - Refill Mounjaro  prescription - Monitor blood glucose levels regularly  Vitamin D  deficiency He is taking prescription vitamin D  50,000 IU weekly and requests a refill. - Refill vitamin D  50,000 IU weekly prescription  Grieving reaction He is experiencing a grieving reaction following the recent passing of his wife. He is seeing a therapist and a bariatric psychologist to address grief and emotional eating behaviors. He reports working through feelings of guilt and anger and is adjusting to spending more time alone. He is supported by family and community. - Continue therapy with current therapist and bariatric psychologist - Encourage social support from  family and community      He was informed of the importance of frequent follow up visits to maximize his success with intensive lifestyle modifications for his multiple health conditions.    Jasmine Mesi, MD

## 2024-04-02 ENCOUNTER — Telehealth (INDEPENDENT_AMBULATORY_CARE_PROVIDER_SITE_OTHER): Payer: Self-pay | Admitting: Psychology

## 2024-04-02 DIAGNOSIS — F5089 Other specified eating disorder: Secondary | ICD-10-CM

## 2024-04-02 DIAGNOSIS — Z634 Disappearance and death of family member: Secondary | ICD-10-CM

## 2024-04-02 DIAGNOSIS — F4323 Adjustment disorder with mixed anxiety and depressed mood: Secondary | ICD-10-CM

## 2024-04-02 NOTE — Progress Notes (Signed)
  Office: [phone]  /  Fax: [phone]    Date: Apr 02, 2024  Appointment Start Time: 8:30am Duration: 33 minutes Provider: Catherene Close, Psy.D. Type of Session: Individual Therapy  Location of Patient: Daughter's home (address obtained; private location) Location of Provider: Provider's Home (private office) Type of Contact: Telepsychological Visit via MyChart Video Visit  Session Content: Kenneth Green is a 58 y.o. male presenting for a follow-up appointment to address the previously established treatment goal of increasing coping skills. Today's appointment was a telepsychological visit. Kenneth Green provided verbal consent for today's telepsychological appointment and he is aware he is responsible for securing confidentiality on his end of the session. Prior to proceeding with today's appointment, Kenneth Green's physical location at the time of this appointment was obtained as well a phone number he could be reached at in the event of technical difficulties. Kenneth Green and this provider participated in today's telepsychological service.   Of note, today's appointment was switched to a regular telephone call at 8:50am with Kenneth Green's verbal consent due to technical issues.   This provider conducted a brief check-in. Kenneth Green shared about recent updates, including engaging in "a lot of gardening." He further shared utilizing SMART goals and a spreadsheet to help with weight loss, noting he is "down 3 pounds." Reflected on progress to date and further processed grief. Reviewed thought challenging and radical acceptance to assist with coping. Overall, Kenneth Green was receptive to today's appointment as evidenced by openness to sharing, responsiveness to feedback, and willingness to continue engaging in learned skills.  Mental Status Examination:  Appearance: neat Behavior: appropriate to circumstances Mood: sad Affect: mood congruent Speech: WNL Eye Contact: appropriate Psychomotor Activity: WNL Gait:  unable to assess Thought Process: linear, logical, and goal directed and no evidence or endorsement of suicidal, homicidal, and self-harm ideation, plan and intent  Thought Content/Perception: no hallucinations, delusions, bizarre thinking or behavior endorsed or observed Orientation: AAOx4 Memory/Concentration: intact Insight: good Judgment: good  Interventions:  Conducted a brief chart review Provided empathic reflections and validation Reviewed content from the previous session Provided positive reinforcement Employed supportive psychotherapy interventions to facilitate reduced distress and to improve coping skills with identified stressors  DSM-5 Diagnosis(es): F50.89 Other Specified Feeding or Eating Disorder, Emotional Eating Behaviors, 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 Kenneth Green has demonstrated progress in his goal as evidenced by increased awareness of hunger patterns, increased awareness of triggers for emotional eating behaviors, and reduction in emotional eating behaviors. Kenneth Green also continues to demonstrate willingness to engage in learned skills (e.g., radical acceptance, thought challenging).  Plan: The next appointment is scheduled for 04/17/2024 at 2:30pm, which will be via MyChart Video Visit. The next session will focus on working towards the established treatment goal. Kenneth Green will continue with his primary therapist.    Catherene Close, PsyD

## 2024-04-04 ENCOUNTER — Ambulatory Visit (INDEPENDENT_AMBULATORY_CARE_PROVIDER_SITE_OTHER): Admitting: Psychology

## 2024-04-04 DIAGNOSIS — F4323 Adjustment disorder with mixed anxiety and depressed mood: Secondary | ICD-10-CM | POA: Diagnosis not present

## 2024-04-04 NOTE — Progress Notes (Signed)
  Behavioral Health Counselor/Therapist Progress Note  [name], MRN: [id number],    Date: 04/04/2024  Time Spent: 60 mins  start time: 1400   end time 1500  Treatment Type: Individual Therapy  Reported Symptoms: Pt presented via Care[facility] video for follow up session, stating his understanding of the limits of virtual visits and granting consent for the session.  Pt stated he was in his home and no one else was present.  I shared with pt that I am in my office and no one else is present.  Mental Status Exam: Appearance:  Casual     Behavior: Appropriate  Motor: Normal  Speech/Language:  Clear and Coherent  Affect: Appropriate  Mood: normal  Thought process: normal  Thought content:   WNL  Sensory/Perceptual disturbances:   WNL  Orientation: oriented to person, place, and time/date  Attention: Good  Concentration: Good  Memory: WNL  Fund of knowledge:  Good  Insight:   Good  Judgment:  Good  Impulse Control: Good   Risk Assessment: Danger to Self:  No Self-injurious Behavior: No Danger to Others: No Duty to Warn:no Physical Aggression / Violence:No  Access to Firearms a concern: No  Gang Involvement:No   Subjective: Pt shares that "I have been doing OK since Kenneth Green's passing at the beginning of April.  I have gotten most all of my garden planted and it is starting to come up already.  Pt shares that "work is going well lately."  Pt shares that he has started to think about how to begin getting rid of Kenneth Green's clothes and crafting things.  "That is going to be a very long process."  Pt shares that he has been sitting on his deck more lately and has enjoyed that time as well.  Pt shares that he still has pain in his left leg primarily and the doctor is encouraging him to give it time, saying that the nerves take lots of time to grow back.  Pt shares that he had a cortisone shot in his hip and that helped a lot.  He may ultimately may need a hip  replacement.  Pt shares that his family (daughters, grandkids, mom, etc.) are all doing fine.  His daughter who is the teacher is ready for school to be out for the summer.  Pt shares that he feels that "I may not be grieving enough.  I may not be sad enough about it."  Talked with pt about his grieving and to give himself permission to feel whatever it is that he is feeling.  Pt is working with the [employer] and [employer] and "have backed way away from the re-enactment events.  Our kids have taken over most of it now."  Pt is going with the [place] and [employer] to [place] this weekend; there are lots of events for them this weekend.  Pt is looking forward to that trip.  Pt shares that he continues to struggle with his emotional eating issues.  "I just can't figure out why I can't get consistent with it. Encouraged pt co think about how much he did for Kenneth Green that benefited her; think about all that he does for others in his life.  Pt shares his drinking is still being managed well and he feels good about that.  Encouraged pt to continue with his self care activities and we will meet in 4 wks for a follow up session.  Interventions: Cognitive Behavioral Therapy  Diagnosis:Adjustment disorder with mixed  anxiety and depressed mood  Plan: Treatment Plan Strengths/Abilities:  Intelligent, Intuitive, Willing to participate in therapy Treatment Preferences:  Outpatient Individual Therapy Statement of Needs:  Patient is to use CBT, mindfulness and coping skills to help manage and/or decrease symptoms associated with their diagnosis. Symptoms:  Depressed/Irritable mood, worry, social withdrawal Problems Addressed:  Depressive thoughts, Sadness, Sleep issues, etc. Long Term Goals:  Pt to reduce overall level, frequency, and intensity of the feelings of depression as evidenced by decreased irritability, negative self talk, and helpless feelings from 6 to 7 days/week to 0 to 1 days/week, per client report, for at  least 3 consecutive months.  Progress: 20% Short Term Goals:  Pt to verbally express understanding of the relationship between feelings of depression and their impact on thinking patterns and behaviors.  Pt to verbalize an understanding of the role that distorted thinking plays in creating fears, excessive worry, and ruminations.  Progress: 20% Target Date:  11/17/2024 Frequency:  Bi-weekly Modality:  Cognitive Behavioral Therapy Interventions by Therapist:  Therapist will use CBT, Mindfulness exercises, Coping skills and Referrals, as needed by client. Client has verbally approved this treatment plan.  Jhonny Moss, [facility]

## 2024-04-08 ENCOUNTER — Other Ambulatory Visit: Payer: Self-pay | Admitting: Behavioral Health

## 2024-04-08 DIAGNOSIS — F3289 Other specified depressive episodes: Secondary | ICD-10-CM

## 2024-04-17 ENCOUNTER — Telehealth (INDEPENDENT_AMBULATORY_CARE_PROVIDER_SITE_OTHER): Payer: Self-pay | Admitting: Psychology

## 2024-04-17 DIAGNOSIS — Z634 Disappearance and death of family member: Secondary | ICD-10-CM | POA: Diagnosis not present

## 2024-04-17 DIAGNOSIS — F5089 Other specified eating disorder: Secondary | ICD-10-CM | POA: Diagnosis not present

## 2024-04-17 DIAGNOSIS — F4323 Adjustment disorder with mixed anxiety and depressed mood: Secondary | ICD-10-CM | POA: Diagnosis not present

## 2024-04-17 NOTE — Progress Notes (Signed)
  Office: [phone]  /  Fax: [phone]    Date: Apr 17, 2024  Appointment Start Time: 2:30pm Duration: 33 minutes Provider: Catherene Close, Psy.D. Type of Session: Individual Therapy  Location of Patient: Home (private location) Location of Provider: Provider's Home (private office) Type of Contact: Telepsychological Visit via MyChart Video Visit  Session Content: Kenneth Green is a 58 y.o. male presenting for a follow-up appointment to address the previously established treatment goal of increasing coping skills. Today's appointment was a telepsychological visit. [name] provided verbal consent for today's telepsychological appointment and he is aware he is responsible for securing confidentiality on his end of the session. Prior to proceeding with today's appointment, Franky's physical location at the time of this appointment was obtained as well a phone number he could be reached at in the event of technical difficulties. [name] and this provider participated in today's telepsychological service.   This provider conducted a brief check-in. Xeng reported he is focusing on cleaning and organizing his home, as well as "working on eating better." Notably, he stated he visited [place] with his [employer] event. Additionally, he discussed "shopping smarter," limiting accessibility to trigger foods, and engaging in pleasurable activities/self-care. Reviewed all-or-nothing thinking and the recent reduction. Remainder of today's appointment focused on "should" and "must" statements (cognitive distortions), especially as it relates to eating habits. Overall, Keijuan was receptive to today's appointment as evidenced by openness to sharing, responsiveness to feedback, and willingness to continue engaging in learned skills.  Mental Status Examination:  Appearance: neat Behavior: appropriate to circumstances Mood: neutral Affect: mood congruent Speech: WNL Eye Contact:  appropriate Psychomotor Activity: WNL Gait: unable to assess Thought Process: linear, logical, and goal directed and no evidence or endorsement of suicidal, homicidal, and self-harm ideation, plan and intent  Thought Content/Perception: no hallucinations, delusions, bizarre thinking or behavior endorsed or observed Orientation: AAOx4 Memory/Concentration: intact Insight: good Judgment: good  Interventions:  Conducted a brief chart review Provided empathic reflections and validation Provided positive reinforcement Employed supportive psychotherapy interventions to facilitate reduced distress and to improve coping skills with identified stressors Psychoeducation provided regarding cognitive distortions   DSM-5 Diagnosis(es): F50.89 Other Specified Feeding or Eating Disorder, Emotional Eating Behaviors, 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 Lauren has demonstrated progress in his goal as evidenced by increased awareness of hunger patterns, increased awareness of triggers for emotional eating behaviors, and reduction in emotional eating behaviors. Kyle also continues to demonstrate willingness to engage in learned skills (e.g., radical acceptance, thought challenging).   Plan: The next appointment is scheduled for 05/14/2024 at 2:30pm, which will be via MyChart Video Visit. The next session will focus on working towards the established treatment goal and possible termination planning. Juergen will continue with his primary therapist.   Catherene Close, PsyD

## 2024-04-19 ENCOUNTER — Other Ambulatory Visit (HOSPITAL_COMMUNITY): Payer: Self-pay

## 2024-04-19 ENCOUNTER — Ambulatory Visit (INDEPENDENT_AMBULATORY_CARE_PROVIDER_SITE_OTHER): Admitting: Family Medicine

## 2024-04-19 ENCOUNTER — Other Ambulatory Visit (INDEPENDENT_AMBULATORY_CARE_PROVIDER_SITE_OTHER): Payer: Self-pay | Admitting: Family Medicine

## 2024-04-19 ENCOUNTER — Encounter (INDEPENDENT_AMBULATORY_CARE_PROVIDER_SITE_OTHER): Payer: Self-pay | Admitting: Family Medicine

## 2024-04-19 VITALS — BP 129/76 | HR 73 | Temp 97.9°F | Ht 69.0 in | Wt 295.0 lb

## 2024-04-19 DIAGNOSIS — E785 Hyperlipidemia, unspecified: Secondary | ICD-10-CM

## 2024-04-19 DIAGNOSIS — E559 Vitamin D deficiency, unspecified: Secondary | ICD-10-CM

## 2024-04-19 DIAGNOSIS — E1169 Type 2 diabetes mellitus with other specified complication: Secondary | ICD-10-CM

## 2024-04-19 DIAGNOSIS — Z6841 Body Mass Index (BMI) 40.0 and over, adult: Secondary | ICD-10-CM

## 2024-04-19 DIAGNOSIS — E66813 Obesity, class 3: Secondary | ICD-10-CM

## 2024-04-19 DIAGNOSIS — E119 Type 2 diabetes mellitus without complications: Secondary | ICD-10-CM | POA: Diagnosis not present

## 2024-04-19 DIAGNOSIS — E538 Deficiency of other specified B group vitamins: Secondary | ICD-10-CM

## 2024-04-19 DIAGNOSIS — E669 Obesity, unspecified: Secondary | ICD-10-CM

## 2024-04-19 DIAGNOSIS — E7849 Other hyperlipidemia: Secondary | ICD-10-CM

## 2024-04-19 DIAGNOSIS — Z7985 Long-term (current) use of injectable non-insulin antidiabetic drugs: Secondary | ICD-10-CM

## 2024-04-19 MED ORDER — VITAMIN D (ERGOCALCIFEROL) 1.25 MG (50000 UNIT) PO CAPS
50000.0000 [IU] | ORAL_CAPSULE | ORAL | 0 refills | Status: DC
Start: 2024-04-19 — End: 2024-05-15
  Filled 2024-04-19: qty 4, 28d supply, fill #0

## 2024-04-19 MED ORDER — MOUNJARO 7.5 MG/0.5ML ~~LOC~~ SOAJ
7.5000 mg | SUBCUTANEOUS | 0 refills | Status: DC
Start: 2024-04-19 — End: 2024-05-15
  Filled 2024-04-19: qty 2, 28d supply, fill #0

## 2024-04-19 NOTE — Progress Notes (Signed)
 Office: [phone]  /  Fax: [phone]  WEIGHT SUMMARY AND BIOMETRICS  Anthropometric Measurements Height: 5\' 9"  (1.753 m) Weight: 295 lb (133.8 kg) BMI (Calculated): 43.54 Weight at Last Visit: 299 lb Weight Lost Since Last Visit: 4 lb Weight Gained Since Last Visit: 0 Starting Weight: 308 lb Total Weight Loss (lbs): 13 lb (5.897 kg) Peak Weight: 308 lb   Body Composition  Body Fat %: 39.1 % Fat Mass (lbs): 115.6 lbs Muscle Mass (lbs): 171 lbs Total Body Water (lbs): 129 lbs Visceral Fat Rating : 26   Other Clinical Data Fasting: no Labs: no Today's Visit #: 14 Starting Date: 04/10/19    Chief Complaint: OBESITY    History of Present Illness Kenneth Green "[name]" is a 58 year old male with obesity and type 2 diabetes who presents for obesity treatment and progress assessment.  He is adhering to a category three eating plan with 75% compliance and has incorporated walking into his routine, exercising for 45 minutes five times a week. He has lost four pounds in the last month. He faces challenges with meal planning, often resorting to 'grab and go' eating or skipping meals due to time constraints.  He has a history of type 2 diabetes and recently experienced an episode of hypoglycemia with a blood sugar reading of 60 mg/dL around 1 PM after waking up. During this episode, he felt 'a little squirrely' and 'fuzzy headed'. Most of the time, his blood sugar levels are in the 80s and 90s. He is currently on Mounjaro  for diabetes management.  He mentions difficulty with hydration, drinking about three 16-ounce bottles of water in a 12-hour work span, which he acknowledges is insufficient. He does not feel thirsty often, attributing this to some of his medications causing dry mouth.  He is actively involved in drumming and participates in various events, which he enjoys and reports no longer causes back issues.      PHYSICAL EXAM:  Blood pressure 129/76, pulse 73,  temperature 97.9 F (36.6 C), height 5\' 9"  (1.753 m), weight 295 lb (133.8 kg), SpO2 97%. Body mass index is 43.56 kg/m.  DIAGNOSTIC DATA REVIEWED:  BMET    Component Value Date/Time   NA 139 10/04/2023 1612   K 3.6 10/04/2023 1612   CL 101 10/04/2023 1612   CO2 24 10/04/2023 1612   GLUCOSE 90 10/04/2023 1612   GLUCOSE 106 (H) 06/15/2016 1700   BUN 14 10/04/2023 1612   CREATININE 1.00 10/04/2023 1612   CALCIUM 10.1 10/04/2023 1612   GFRNONAA 74 05/11/2021 0000   GFRAA 91 01/15/2021 1322   Lab Results  Component Value Date   HGBA1C 5.8 (H) 10/04/2023   HGBA1C 5.9 (H) 04/10/2019   Lab Results  Component Value Date   INSULIN  20.9 10/04/2023   INSULIN  30.9 (H) 04/10/2019   Lab Results  Component Value Date   TSH 1.020 02/22/2023   CBC    Component Value Date/Time   WBC 5.1 02/22/2023 1540   WBC 7.7 06/15/2016 1700   RBC 5.03 02/22/2023 1540   RBC 4.87 06/15/2016 1700   HGB 14.2 02/22/2023 1540   HCT 43.3 02/22/2023 1540   PLT 205 02/22/2023 1540   MCV 86 02/22/2023 1540   MCH 28.2 02/22/2023 1540   MCH 29.0 06/15/2016 1700   MCHC 32.8 02/22/2023 1540   MCHC 34.6 06/15/2016 1700   RDW 13.3 02/22/2023 1540   Iron Studies No results found for: "IRON", "TIBC", "FERRITIN", "IRONPCTSAT" Lipid Panel  Component Value Date/Time   CHOL 213 (H) 10/04/2023 1612   TRIG 132 10/04/2023 1612   HDL 49 10/04/2023 1612   LDLCALC 140 (H) 10/04/2023 1612   Hepatic Function Panel     Component Value Date/Time   PROT 7.3 10/04/2023 1612   ALBUMIN 4.5 10/04/2023 1612   AST 31 10/04/2023 1612   ALT 37 10/04/2023 1612   ALKPHOS 73 10/04/2023 1612   BILITOT 0.4 10/04/2023 1612      Component Value Date/Time   TSH 1.020 02/22/2023 1540   Nutritional Lab Results  Component Value Date   VD25OH 41.2 10/04/2023   VD25OH 33.6 02/22/2023   VD25OH 31.4 01/05/2023     Assessment and Plan Assessment & Plan Type 2 diabetes mellitus Type 2 diabetes mellitus is managed  with Mounjaro . He experienced one episode of hypoglycemia with blood glucose in the 60s, likely due to fasting. Blood glucose levels are otherwise in the 80s and 90s. Consideration of a protein snack before bed is advised if hypoglycemia occurs more frequently. - Continue current Mounjaro  dosage. - Consider a protein snack before bed if hypoglycemia occurs more frequently.  Obesity Obesity management is ongoing with a category three eating plan. He adheres to the plan 75% of the time and engages in regular physical activity, walking 45 minutes five times a week. He has lost four pounds in the last month, indicating progress. Meal planning remains a challenge, and strategies such as list-making and scheduling are recommended to improve adherence. - Continue category three eating plan. - Encourage adherence to meal planning and preparation. - Continue regular physical activity, walking 45 minutes five times a week.  Hyperlipidemia Hyperlipidemia is currently not treated with statins. He is due for blood work to assess cholesterol levels. - Order blood work to assess cholesterol levels.  Vitamin D  deficiency He is due for a refill of vitamin D . - Send in prescription for vitamin D .  Vitamin B12 deficiency Vitamin B12 deficiency is monitored. He is due for blood work to assess vitamin B12 levels. - Order blood work to assess vitamin B12 levels.  General Health Maintenance He is due for routine blood work to assess various health parameters including blood glucose, vitamin levels, cholesterol, renal function, hepatic function, and electrolytes. He is advised to increase water intake to approximately 100 ounces per day, especially as the weather gets warmer, to prevent dehydration. - Order routine blood work. - Advise increasing water intake to approximately 100 ounces per day.   He was informed of the importance of frequent follow up visits to maximize his success with intensive lifestyle  modifications for his multiple health conditions.    Jasmine Mesi, MD

## 2024-05-01 ENCOUNTER — Ambulatory Visit: Payer: 59 | Admitting: Behavioral Health

## 2024-05-01 ENCOUNTER — Encounter: Payer: Self-pay | Admitting: Behavioral Health

## 2024-05-01 DIAGNOSIS — F331 Major depressive disorder, recurrent, moderate: Secondary | ICD-10-CM | POA: Diagnosis not present

## 2024-05-01 DIAGNOSIS — F3289 Other specified depressive episodes: Secondary | ICD-10-CM

## 2024-05-01 DIAGNOSIS — F411 Generalized anxiety disorder: Secondary | ICD-10-CM | POA: Diagnosis not present

## 2024-05-01 MED ORDER — BUPROPION HCL ER (SR) 200 MG PO TB12: 200.0000 mg | ORAL_TABLET | Freq: Two times a day (BID) | ORAL | 1 refills | Status: DC

## 2024-05-01 MED ORDER — SERTRALINE HCL 50 MG PO TABS: 50.0000 mg | ORAL_TABLET | Freq: Every day | ORAL | 1 refills | Status: DC

## 2024-05-01 NOTE — Progress Notes (Signed)
 [facility]  [name],  MRN: [id number]  PCP: Helyn Lobstein, MD  Date of Evaluation: 05/01/2024 Time spent:30 minutes  Chief Complaint:   HISTORY/CURRENT STATUS: HPI 58 year old male presents to this office for follow up and medication management. He is calm but subdued today. His wife passed in March un expectantly. Says that he is staying busy and coping the best he can. He continues in therapy.  He reports his anxiety today at 3/10  and depression at 3/10. Says he is sleeping 7-8 hours nightly. Denies mania, no psychosis. No SI/HI.    No Past medication failures Individual Medical History/ Review of Systems: Changes? :No   Allergies: Iohexol  Current Medications:  Current Outpatient Medications:    amLODipine (NORVASC) 5 MG tablet, Take 5 mg by mouth daily., Disp: , Rfl:    benazepril -hydrochlorthiazide (LOTENSIN  HCT) 20-25 MG tablet, Take 1 tablet by mouth daily., Disp: , Rfl:    buPROPion  (WELLBUTRIN  SR) 200 MG 12 hr tablet, Take 1 tablet (200 mg total) by mouth 2 (two) times daily., Disp: 180 tablet, Rfl: 1   fexofenadine (ALLEGRA) 180 MG tablet, Take 180 mg by mouth daily., Disp: , Rfl:    latanoprost (XALATAN) 0.005 % ophthalmic solution, SMARTSIG:In Eye(s), Disp: , Rfl:    pregabalin (LYRICA) 150 MG capsule, Take 150 mg by mouth 2 (two) times daily., Disp: , Rfl:    sertraline  (ZOLOFT ) 50 MG tablet, Take 1 tablet (50 mg total) by mouth daily., Disp: 90 tablet, Rfl: 1   tirzepatide  (MOUNJARO ) 7.5 MG/0.5ML Pen, Inject 7.5 mg into the skin once a week., Disp: 2 mL, Rfl: 0   Vitamin D , Ergocalciferol , (DRISDOL ) 1.25 MG (50000 UNIT) CAPS capsule, Take 1 capsule (50,000 Units total) by mouth every 7 (seven) days., Disp: 4 capsule, Rfl: 0 Medication Side Effects: none  Family Medical/ Social History: Changes? No  MENTAL HEALTH EXAM:  There were no vitals taken for this visit.There is no height or weight on file to calculate BMI.  General  Appearance: Casual and Neat  Eye Contact:  Good  Speech:  Clear and Coherent  Volume:  Normal  Mood:  NA  Affect:  Appropriate  Thought Process:  Coherent  Orientation:  Full (Time, Place, and Person)  Thought Content: Logical   Suicidal Thoughts:  No  Homicidal Thoughts:  No  Memory:  WNL  Judgement:  Good  Insight:  Good  Psychomotor Activity:  Normal  Concentration:  Concentration: Good  Recall:  Good  Fund of Knowledge: Good  Language: Good  Assets:  Desire for Improvement  ADL's:  Intact  Cognition: WNL  Prognosis:  Good    DIAGNOSES:    ICD-10-CM   1. Major depressive disorder, recurrent episode, moderate (HCC)  F33.1 sertraline  (ZOLOFT ) 50 MG tablet    2. Generalized anxiety disorder  F41.1 sertraline  (ZOLOFT ) 50 MG tablet    3. Other depression with emotional eating  F32.89 buPROPion  (WELLBUTRIN  SR) 200 MG 12 hr tablet      Receiving Psychotherapy: No    RECOMMENDATIONS:  Greater than 50% of 30 min face to face time with patient was spent on counseling and coordination of care.  Happy with his medication right now. We discussed his wife's passing. He is utilizing healthy coping mechanisms. Strong family support from daughters.  Adjusting to living alone. Continues in therapy. Requesting no changes to his medications right now.     We discussed the following plan: To continue Zoloft  to 50  mg daily Continue Wellbutrin  200 mg SR two times daily To report side effects or worsening symptoms promptly Provided emergency contact information To follow up in 6 months to reassess. Reviewed PDMP    Lincoln Renshaw, NP

## 2024-05-02 LAB — LIPID PANEL WITH LDL/HDL RATIO
Cholesterol, Total: 175 mg/dL (ref 100–199)
HDL: 47 mg/dL (ref >39)
LDL Chol Calc (NIH): 106 mg/dL — ABNORMAL HIGH (ref 0–99)
LDL/HDL Ratio: 2.3 ratio (ref 0.0–3.6)
Triglycerides: 120 mg/dL (ref 0–149)
VLDL Cholesterol Cal: 22 mg/dL (ref 5–40)

## 2024-05-02 LAB — CMP14+EGFR
ALT: 26 IU/L (ref 0–44)
AST: 20 IU/L (ref 0–40)
Albumin: 4.3 g/dL (ref 3.8–4.9)
Alkaline Phosphatase: 63 IU/L (ref 44–121)
BUN/Creatinine Ratio: 15 (ref 9–20)
BUN: 16 mg/dL (ref 6–24)
Bilirubin Total: 0.3 mg/dL (ref 0.0–1.2)
CO2: 24 mmol/L (ref 20–29)
Calcium: 10.2 mg/dL (ref 8.7–10.2)
Chloride: 101 mmol/L (ref 96–106)
Creatinine, Ser: 1.07 mg/dL (ref 0.76–1.27)
Globulin, Total: 2.3 g/dL (ref 1.5–4.5)
Glucose: 87 mg/dL (ref 70–99)
Potassium: 4.4 mmol/L (ref 3.5–5.2)
Sodium: 141 mmol/L (ref 134–144)
Total Protein: 6.6 g/dL (ref 6.0–8.5)
eGFR: 80 mL/min/{1.73_m2} (ref >59)

## 2024-05-02 LAB — HEMOGLOBIN A1C
Est. average glucose Bld gHb Est-mCnc: 111 mg/dL
Hgb A1c MFr Bld: 5.5 % (ref 4.8–5.6)

## 2024-05-02 LAB — VITAMIN D 25 HYDROXY (VIT D DEFICIENCY, FRACTURES): Vit D, 25-Hydroxy: 37.8 ng/mL (ref 30.0–100.0)

## 2024-05-02 LAB — VITAMIN B12: Vitamin B-12: 386 pg/mL (ref 232–1245)

## 2024-05-02 LAB — INSULIN, RANDOM: INSULIN: 37.1 u[IU]/mL — ABNORMAL HIGH (ref 2.6–24.9)

## 2024-05-03 ENCOUNTER — Ambulatory Visit: Admitting: Psychology

## 2024-05-03 DIAGNOSIS — F4323 Adjustment disorder with mixed anxiety and depressed mood: Secondary | ICD-10-CM | POA: Diagnosis not present

## 2024-05-03 NOTE — Progress Notes (Signed)
  Behavioral Health Counselor/Therapist Progress Note  [name], MRN: [id number],    Date: 05/03/2024  Time Spent: 60 mins  start time: 1400   end time 1500  Treatment Type: Individual Therapy  Reported Symptoms: Pt presented via Caregility video for follow up session, stating his understanding of the limits of virtual visits and granting consent for the session.  Pt stated he was in his home and no one else was present.  I shared with pt that I am in my office and no one else is present.  Mental Status Exam: Appearance:  Casual     Behavior: Appropriate  Motor: Normal  Speech/Language:  Clear and Coherent  Affect: Appropriate  Mood: normal  Thought process: normal  Thought content:   WNL  Sensory/Perceptual disturbances:   WNL  Orientation: oriented to person, place, and time/date  Attention: Good  Concentration: Good  Memory: WNL  Fund of knowledge:  Good  Insight:   Good  Judgment:  Good  Impulse Control: Good   Risk Assessment: Danger to Self:  No Self-injurious Behavior: No Danger to Others: No Duty to Warn:no Physical Aggression / Violence:No  Access to Firearms a concern: No  Gang Involvement:No   Subjective: Pt shares that I have been doing pretty well since our last session.  I went to [place] with the [place] and [employer] and that was a lot of fun.  We are doing a couple of things for the 4th of July as well.  Pt shares that he really enjoyed those events and is looking forward to the coming events as well.  Talked with pt about the possibility of celebrating [name]'s passing (early April) and how that benefited her and how it benefited pt as well.  Pt shares that work is going well; it has been a little bit slow recently.  Pt feels like he is doing well at work.  Pt shares that his daughter and son-in-law just celebrated the wedding anniversary.  His daughter who is a Runner, broadcasting/film/video is out of school for the summer.  Pt shares that his mom is  doing well too; she is 14 yo; she is slowing down a little bit but is still able to do lots of things.  Pt shares that he has been sitting on his deck more lately and has enjoyed that time as well.  Pt's back and leg pain seems to be improving and he is thankful for that too.  Pt shares that he has been scrolling more on his phone, given that fact that he has more time on his hands.  We talked about how our phones are set up to keep us  engaged.  Pt shares his drinking is still being managed well and he feels good about that.  Encouraged pt to continue with his self care activities and we will meet in 4 wks for a follow up session.  Interventions: Cognitive Behavioral Therapy  Diagnosis:Adjustment disorder with mixed anxiety and depressed mood  Plan: Treatment Plan Strengths/Abilities:  Intelligent, Intuitive, Willing to participate in therapy Treatment Preferences:  Outpatient Individual Therapy Statement of Needs:  Patient is to use CBT, mindfulness and coping skills to help manage and/or decrease symptoms associated with their diagnosis. Symptoms:  Depressed/Irritable mood, worry, social withdrawal Problems Addressed:  Depressive thoughts, Sadness, Sleep issues, etc. Long Term Goals:  Pt to reduce overall level, frequency, and intensity of the feelings of depression as evidenced by decreased irritability, negative self talk, and helpless feelings from 6  to 7 days/week to 0 to 1 days/week, per client report, for at least 3 consecutive months.  Progress: 20% Short Term Goals:  Pt to verbally express understanding of the relationship between feelings of depression and their impact on thinking patterns and behaviors.  Pt to verbalize an understanding of the role that distorted thinking plays in creating fears, excessive worry, and ruminations.  Progress: 20% Target Date:  11/17/2024 Frequency:  Bi-weekly Modality:  Cognitive Behavioral Therapy Interventions by Therapist:  Therapist will use CBT,  Mindfulness exercises, Coping skills and Referrals, as needed by client. Client has verbally approved this treatment plan.  Jhonny Moss, [facility]

## 2024-05-12 ENCOUNTER — Other Ambulatory Visit (INDEPENDENT_AMBULATORY_CARE_PROVIDER_SITE_OTHER): Payer: Self-pay | Admitting: Family Medicine

## 2024-05-12 DIAGNOSIS — E1169 Type 2 diabetes mellitus with other specified complication: Secondary | ICD-10-CM

## 2024-05-14 ENCOUNTER — Telehealth (INDEPENDENT_AMBULATORY_CARE_PROVIDER_SITE_OTHER): Payer: Self-pay | Admitting: Psychology

## 2024-05-14 DIAGNOSIS — Z634 Disappearance and death of family member: Secondary | ICD-10-CM

## 2024-05-14 DIAGNOSIS — F4323 Adjustment disorder with mixed anxiety and depressed mood: Secondary | ICD-10-CM

## 2024-05-14 DIAGNOSIS — F5089 Other specified eating disorder: Secondary | ICD-10-CM | POA: Diagnosis not present

## 2024-05-14 LAB — BASIC METABOLIC PANEL WITH GFR
BUN: 12 (ref 4–21)
CO2: 26 — AB (ref 13–22)
Chloride: 104 (ref 99–108)
Creatinine: 1.2 (ref 0.6–1.3)
Glucose: 103
Potassium: 3.9 meq/L (ref 3.5–5.1)
Sodium: 139 (ref 137–147)

## 2024-05-14 LAB — HEPATIC FUNCTION PANEL
ALT: 27 U/L (ref 10–40)
AST: 20 (ref 14–40)
Alkaline Phosphatase: 58 (ref 25–125)
Bilirubin, Total: 0.3

## 2024-05-14 LAB — CBC: RBC: 4.92 (ref 3.87–5.11)

## 2024-05-14 NOTE — Progress Notes (Signed)
 Office: [phone]  /  Fax: [phone]    Date: May 14, 2024  Appointment Start Time: 2:35pm Duration: 30 minutes Provider: Wyatt Green, Psy.D. Type of Session: Individual Therapy  Location of Patient: Home (private location) Location of Provider: Provider's Home (private office) Type of Contact: Telepsychological Visit via MyChart Video Visit  Session Content: Kenneth Green is a 58 y.o. male presenting for a follow-up appointment to address the previously established treatment goal of increasing coping skills. Today's appointment was a telepsychological visit. Kenneth Green provided verbal consent for today's telepsychological appointment and he is aware he is responsible for securing confidentiality on his end of the session. Prior to proceeding with today's appointment, Kenneth Green physical location at the time of this appointment was obtained as well a phone number he could be reached at in the event of technical difficulties. Kenneth Green and this provider participated in today's telepsychological service.   This provider conducted a brief check-in. Kenneth Green shared, Something weird is going on with my blood pressure today. It's way lower than normal. He stated he is frequently checking it and it is getting better; however, it is still lower than his norm. Kenneth Green explained symptoms started yesterday, noting an exacerbation today (lightheadedness, extreme fatigue, and drowsiness). He further shared he sent a message to his PCP to see them today; however, he has not heard back. Kenneth Green reflected on content from the last appointment, noting increased awareness of should and must statements. Regarding eating habits, Kenneth Green noted he is eating a lot less, but acknowledged a need to eat more of the foods his body needs while he continues to lose weight. He also shared he ceased alcohol last week as he noticed he was drinking regularly, but not in excess. Kenneth Green of today's appointment focused on  Kenneth Green seeking medical advice/attention for BP issues. Kenneth Green left his daughter a Engineer, technical sales and text message requesting a call back so she can take him to the walk in the clinic. She reportedly replied to his text informing him she was on her way home. He also called his PCP's office during today's appointment for medical advice. During his phone call, he shared with the office that he will have someone drive him as he does not feel comfortable driving. The nurse advised Kenneth Green go to the walk-in clinic, and he informed them he would go as soon as [his] daughter comes home. He was advised by the nurse to call 911 if symptoms worsen or new symptoms arise prior to his daughter taking him to the walk-in clinic. Kenneth Green agreed. Overall, Kenneth Green was receptive to today's appointment as evidenced by openness to sharing, responsiveness to feedback, and willingness to continue engaging in learned skills.  Mental Status Examination:  Appearance: neat Behavior: appropriate to circumstances Mood: sad Affect: mood congruent Speech: WNL Eye Contact: appropriate Psychomotor Activity: WNL Gait: unable to assess Thought Process: linear, logical, and goal directed and no evidence or endorsement of suicidal, homicidal, and self-harm ideation, plan and intent  Thought Content/Perception: no hallucinations, delusions, bizarre thinking or behavior endorsed or observed Orientation: AAOx4 Memory/Concentration: intact Insight: good Judgment: good  Interventions:  Conducted a brief chart review Provided empathic reflections and validation Reviewed content from the previous session Provided positive reinforcement Employed supportive psychotherapy interventions to facilitate reduced distress and to improve coping skills with identified stressors Employed acceptance and commitment interventions to emphasize mindfulness, acceptance without struggle, as well as value guided actions  DSM-5 Diagnosis(es): F50.89 Other  Specified Feeding or Eating Disorder, Emotional Eating Behaviors, F43.23 Adjustment Disorder with  Mixed Anxiety and Depressed Mood, and Z63.4 Uncomplicated Bereavement  Treatment Goal & Progress: During the initial appointment with this provider, the following treatment goal was established: increase coping skills. Kenneth Green has demonstrated progress in his goal as evidenced by increased awareness of hunger patterns, increased awareness of triggers for emotional eating behaviors, and reduction in emotional eating behaviors. Kenneth Green also continues to demonstrate willingness to engage in learned skills (e.g., radical acceptance, thought challenging).    Plan: The next appointment is scheduled for 06/04/2024 at 4pm, which will be via MyChart Video Visit. The next session will focus on working towards the established treatment goal and possible termination planning. Kenneth Green will continue with his primary therapist. Notably, Kenneth Green will go to the walk-in clinic with his daughter today and will call 911 if symptoms worsen or new symptoms appear prior to him going to the clinic.    Kenneth Fire, PsyD

## 2024-05-15 ENCOUNTER — Telehealth (INDEPENDENT_AMBULATORY_CARE_PROVIDER_SITE_OTHER): Payer: Self-pay | Admitting: Psychology

## 2024-05-15 ENCOUNTER — Ambulatory Visit (INDEPENDENT_AMBULATORY_CARE_PROVIDER_SITE_OTHER): Payer: Self-pay | Admitting: Family Medicine

## 2024-05-15 ENCOUNTER — Other Ambulatory Visit (HOSPITAL_COMMUNITY): Payer: Self-pay

## 2024-05-15 ENCOUNTER — Encounter (INDEPENDENT_AMBULATORY_CARE_PROVIDER_SITE_OTHER): Payer: Self-pay | Admitting: Family Medicine

## 2024-05-15 VITALS — BP 148/84 | HR 69 | Temp 98.0°F | Ht 69.0 in | Wt 299.0 lb

## 2024-05-15 DIAGNOSIS — E669 Obesity, unspecified: Secondary | ICD-10-CM

## 2024-05-15 DIAGNOSIS — E1169 Type 2 diabetes mellitus with other specified complication: Secondary | ICD-10-CM

## 2024-05-15 DIAGNOSIS — Z6841 Body Mass Index (BMI) 40.0 and over, adult: Secondary | ICD-10-CM

## 2024-05-15 DIAGNOSIS — E782 Mixed hyperlipidemia: Secondary | ICD-10-CM

## 2024-05-15 DIAGNOSIS — E119 Type 2 diabetes mellitus without complications: Secondary | ICD-10-CM

## 2024-05-15 DIAGNOSIS — E559 Vitamin D deficiency, unspecified: Secondary | ICD-10-CM | POA: Diagnosis not present

## 2024-05-15 DIAGNOSIS — E785 Hyperlipidemia, unspecified: Secondary | ICD-10-CM | POA: Diagnosis not present

## 2024-05-15 DIAGNOSIS — Z7985 Long-term (current) use of injectable non-insulin antidiabetic drugs: Secondary | ICD-10-CM

## 2024-05-15 DIAGNOSIS — I10 Essential (primary) hypertension: Secondary | ICD-10-CM | POA: Diagnosis not present

## 2024-05-15 LAB — CBC AND DIFFERENTIAL
HCT: 42 (ref 41–53)
Hemoglobin: 13.9 (ref 13.5–17.5)
Platelets: 254 K/uL (ref 150–400)
WBC: 7.1

## 2024-05-15 LAB — COMPREHENSIVE METABOLIC PANEL WITH GFR
Albumin: 4.5 (ref 3.5–5.0)
Calcium: 10.3 (ref 8.7–10.7)
eGFR: 70

## 2024-05-15 LAB — TSH: TSH: 0.91 (ref 0.41–5.90)

## 2024-05-15 MED ORDER — VITAMIN D (ERGOCALCIFEROL) 1.25 MG (50000 UNIT) PO CAPS
50000.0000 [IU] | ORAL_CAPSULE | ORAL | 0 refills | Status: DC
  Filled 2024-05-15: qty 4, 28d supply, fill #0

## 2024-05-15 MED ORDER — MOUNJARO 7.5 MG/0.5ML ~~LOC~~ SOAJ
7.5000 mg | SUBCUTANEOUS | 0 refills | Status: DC
  Filled 2024-05-15 (×3): qty 2, 28d supply, fill #0

## 2024-05-15 MED ORDER — ROSUVASTATIN CALCIUM 5 MG PO TABS
5.0000 mg | ORAL_TABLET | Freq: Every day | ORAL | 0 refills | Status: DC
  Filled 2024-05-15: qty 30, 30d supply, fill #0

## 2024-05-15 NOTE — Telephone Encounter (Signed)
  Office: [phone]  /  Fax: [phone]  Date of Call: May 15, 2024  Duration of Call: ~3 minute(s) Provider: Wyatt Fire, PsyD  CONTENT: This provider called Kenneth Green to check-in due to BP concerns expressed during the appointment yesterday. He shared he went to the walk-in clinic as advised by his PCP's office and an EKG and labs were completed. Kenneth Green stated he is feeling better today as he got a lot of sleep and focused on water and electrolyte intake as recommended by the walk-in clinic provider. He is currently waiting on lab results. No evidence or endorsement of any safety concerns. All questions/concerns addressed.   PLAN: Kenneth Green is scheduled for an appointment on 06/04/2024 at 4pm via MyChart Video Visit with this provider.

## 2024-05-15 NOTE — Progress Notes (Signed)
 Office: [phone]  /  Fax: [phone]  WEIGHT SUMMARY AND BIOMETRICS  Anthropometric Measurements Height: 5' 9 (1.753 m) Weight: 299 lb (135.6 kg) BMI (Calculated): 44.13 Weight at Last Visit: 295 lb Weight Lost Since Last Visit: 0 Weight Gained Since Last Visit: 4 lb Starting Weight: 308 lb Total Weight Loss (lbs): 9 lb (4.082 kg)   Body Composition  Body Fat %: 37.9 % Fat Mass (lbs): 113.6 lbs Muscle Mass (lbs): 177 lbs Total Body Water (lbs): 132.2 lbs Visceral Fat Rating : 26   Other Clinical Data Fasting: No Labs: No Today's Visit #: 15 Starting Date: 04/10/19    Chief Complaint: OBESITY   History of Present Illness Kenneth Green is a 58 year old male with obesity, type 2 diabetes, and hypertension who presents for a specialist obesity treatment plan.  He is adhering to a category three eating plan approximately sixty percent of the time and engages in walking for forty-five minutes, five days a week. Despite these efforts, he has gained four pounds in the last month since his last visit. He faces challenges with meal planning and grocery shopping, often shopping without a plan, and is working on improving his meal planning and preparation.  He has type 2 diabetes and is currently treated with Mounjaro  7.5 mg, for which he requests a refill. He does not experience hunger and typically consumes one meal a day. His recent glucose level was 87, and his A1c is 5.5%.  He has hypertension and reports elevated blood pressure readings today at 148/84 and 145/82. He is on amlodipine, benazepril , and hydrochlorothiazide . He experiences lightheadedness and dizziness with low blood pressure readings at home, but these normalized by the time he reached the clinic. He recently underwent an EKG and orthostatic blood pressure measurements.  He has a history of vitamin D  deficiency and is on prescription vitamin D  50,000 IU weekly. His vitamin D  levels have been in  the thirties and forties recently.      PHYSICAL EXAM:  Blood pressure (!) 148/84, pulse 69, temperature 98 F (36.7 C), height 5' 9 (1.753 m), weight 299 lb (135.6 kg), SpO2 97%. Body mass index is 44.15 kg/m.  DIAGNOSTIC DATA REVIEWED:  BMET    Component Value Date/Time   NA 141 04/30/2024 1409   K 4.4 04/30/2024 1409   CL 101 04/30/2024 1409   CO2 24 04/30/2024 1409   GLUCOSE 87 04/30/2024 1409   GLUCOSE 106 (H) 06/15/2016 1700   BUN 16 04/30/2024 1409   CREATININE 1.07 04/30/2024 1409   CALCIUM 10.2 04/30/2024 1409   GFRNONAA 74 05/11/2021 0000   GFRAA 91 01/15/2021 1322   Lab Results  Component Value Date   HGBA1C 5.5 04/30/2024   HGBA1C 5.9 (H) 04/10/2019   Lab Results  Component Value Date   INSULIN  37.1 (H) 04/30/2024   INSULIN  30.9 (H) 04/10/2019   Lab Results  Component Value Date   TSH 1.020 02/22/2023   CBC    Component Value Date/Time   WBC 5.1 02/22/2023 1540   WBC 7.7 06/15/2016 1700   RBC 5.03 02/22/2023 1540   RBC 4.87 06/15/2016 1700   HGB 14.2 02/22/2023 1540   HCT 43.3 02/22/2023 1540   PLT 205 02/22/2023 1540   MCV 86 02/22/2023 1540   MCH 28.2 02/22/2023 1540   MCH 29.0 06/15/2016 1700   MCHC 32.8 02/22/2023 1540   MCHC 34.6 06/15/2016 1700   RDW 13.3 02/22/2023 1540   Iron Studies No results  found for: IRON, TIBC, FERRITIN, IRONPCTSAT Lipid Panel     Component Value Date/Time   CHOL 175 04/30/2024 1409   TRIG 120 04/30/2024 1409   HDL 47 04/30/2024 1409   LDLCALC 106 (H) 04/30/2024 1409   Hepatic Function Panel     Component Value Date/Time   PROT 6.6 04/30/2024 1409   ALBUMIN 4.3 04/30/2024 1409   AST 20 04/30/2024 1409   ALT 26 04/30/2024 1409   ALKPHOS 63 04/30/2024 1409   BILITOT 0.3 04/30/2024 1409      Component Value Date/Time   TSH 1.020 02/22/2023 1540   Nutritional Lab Results  Component Value Date   VD25OH 37.8 04/30/2024   VD25OH 41.2 10/04/2023   VD25OH 33.6 02/22/2023      Assessment and Plan Assessment & Plan Hypertension Blood pressure is elevated at 148/84 and 145/82. He is on amlodipine, benazepril , and hydrochlorothiazide . Recent episodes of lightheadedness and dizziness with low blood pressure were evaluated at a walk-in clinic. Orthostatic blood pressure measurements and EKG were normal. Labs for electrolytes and thyroid  function are pending. Discussed the importance of maintaining blood pressure within target range, medication adherence, and hydration to prevent dizziness and lightheadedness. - Monitor blood pressure regularly - Review lab results when available - Continue current antihypertensive medications - Encourage hydration if experiencing lightheadedness or dizziness  Type 2 Diabetes Mellitus On Mounjaro  7.5 mg with well-controlled blood glucose, recent glucose level of 87, and A1c of 5.5. Discussed Mounjaro 's role in managing hunger signals and gastric emptying. He reports not experiencing hunger, which may lead to skipping meals and inadequate caloric intake. Emphasized regular meals to maintain metabolism and prevent weight gain. Consider adjusting Mounjaro  dosage if hunger signals remain inappropriate. - Refill Mounjaro  7.5 mg - Encourage regular meal intake to maintain metabolism - Consider dosage adjustment if hunger signals remain inappropriate  Obesity Following a category three eating plan about 60% of the time and walking 45 minutes, five days a week. Despite these efforts, he has gained four pounds in the last month. Discussed challenges with meal planning and grocery shopping. Encouraged use of AI for meal planning and grocery delivery services to avoid temptation and save time. Emphasized regular meal intake to maintain metabolism and prevent weight gain. - Continue category three eating plan - Encourage consistent adherence to meal planning and grocery shopping based on meal plans - Consider using AI for meal planning and  grocery delivery services - Encourage regular meal intake to maintain metabolism  Hyperlipidemia Cholesterol levels have improved with LDL reduced from 140 to 106. Goal is to reduce LDL to below 100, ideally to 70, due to increased cardiovascular risk associated with diabetes. Not currently on cholesterol medication. Discussed benefits of starting a low-dose statin to reduce arterial inflammation and further lower cholesterol levels. Explained that statins are standard of care for diabetes patients to reduce heart risk by decreasing arterial inflammation. - Start Crestor 5 mg daily, preferably in the evening  Vitamin D  Deficiency On prescription vitamin D  50,000 IU weekly. Recent labs show vitamin D  levels in the 30s and 40s, below the target range of 50-60. Discussed the option of adding an over-the-counter multivitamin to help increase vitamin D  levels. Emphasized the importance of maintaining adequate vitamin D  levels for overall health. - Refill prescription vitamin D  50,000 IU weekly - Consider adding a multivitamin, such as Centrum Silver, to daily regimen  Follow-up Has upcoming appointments in July and August. Discussed the option of scheduling a September appointment closer to the  date due to potential schedule conflicts. - Follow up in July and August as scheduled - Consider scheduling September appointment closer to the date     He was informed of the importance of frequent follow up visits to maximize his success with intensive lifestyle modifications for his multiple health conditions.    [name], MD

## 2024-05-30 ENCOUNTER — Ambulatory Visit (INDEPENDENT_AMBULATORY_CARE_PROVIDER_SITE_OTHER): Admitting: Psychology

## 2024-05-30 DIAGNOSIS — F4323 Adjustment disorder with mixed anxiety and depressed mood: Secondary | ICD-10-CM | POA: Diagnosis not present

## 2024-05-30 NOTE — Progress Notes (Signed)
 [facility] Counselor/Therapist Progress Note  [name], MRN: [id number],    Date: 05/30/2024  Time Spent: 60 mins  start time: 1400   end time 1500  Treatment Type: Individual Therapy  Reported Symptoms: Pt presented via Caregility video for follow up session, stating his understanding of the limits of virtual visits and granting consent for the session.  Pt stated he was in his home and no one else was present.  I shared with pt that I am in my office and no one else is present.  Mental Status Exam: Appearance:  Casual     Behavior: Appropriate  Motor: Normal  Speech/Language:  Clear and Coherent  Affect: Appropriate  Mood: normal  Thought process: normal  Thought content:   WNL  Sensory/Perceptual disturbances:   WNL  Orientation: oriented to person, place, and time/date  Attention: Good  Concentration: Good  Memory: WNL  Fund of knowledge:  Good  Insight:   Good  Judgment:  Good  Impulse Control: Good   Risk Assessment: Danger to Self:  No Self-injurious Behavior: No Danger to Others: No Duty to Warn:no Physical Aggression / Violence:No  Access to Firearms a concern: No  Gang Involvement:No   Subjective: Pt shares that I have been doing pretty well since our last session.  I have been doing some events with the [place] and [employer] on 7/4 in [place] and the next day in [place].  Both events went really well and we had good turn out from our folks and from the audiences.  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  Pt would likely have to train his replacement for night shift.  Pt shares that he is continuing to adjust to [place] being absent from the home and some days are easier than others.  Pt has found that he is having some success with celebrating memories of  [place] as he continues to grieve.  Pt shares that his daughter Eddye) is working hard and that the grand kids are doing well.  Pt shares his other daughter, [name], is travelling a lot this summer.  Pt shares that he is having problems motivating himself to be productive.  Encouraged pt to spend a moment at the beginning or end of each day to evaluate what he had accomplished that day (going to work, mowing, laundry, cleaning, cooking, etc.).  We will talk about what he is getting done at our follow up session.  He is spending a lot of time scrolling on his phone.  We talked about ways to moderate our scrolling behavior.  He mentions that he did not scroll nearly as much last week when he had [name]'s dog with him for the week.  Pt shares his mom has fallen again recently outside of the house, without her phone.  A family member stopped by and found her and helped get her back inside.  She is 60 yo and is just getting more frail.  Pt shares that he has been sitting on his deck more lately and has enjoyed that time as well.  Pt's back and leg pain seems to be improving and he is thankful for that too.  Pt shares that he has not been drinking in the past 3 weeks; he just  decided to not drink the first day and has made that decision every day since then.  Congratulated pt on his sobriety.  Encouraged pt to continue with his self care activities and we will meet in 4 wks for a follow up session.  Interventions: Cognitive Behavioral Therapy  Diagnosis:Adjustment disorder with mixed anxiety and depressed mood  Plan: Treatment Plan Strengths/Abilities:  Intelligent, Intuitive, Willing to participate in therapy Treatment Preferences:  Outpatient Individual Therapy Statement of Needs:  Patient is to use CBT, mindfulness and coping skills to help manage and/or decrease symptoms associated with their diagnosis. Symptoms:  Depressed/Irritable mood, worry, social withdrawal Problems Addressed:  Depressive  thoughts, Sadness, Sleep issues, etc. Long Term Goals:  Pt to reduce overall level, frequency, and intensity of the feelings of depression as evidenced by decreased irritability, negative self talk, and helpless feelings from 6 to 7 days/week to 0 to 1 days/week, per client report, for at least 3 consecutive months.  Progress: 40% Short Term Goals:  Pt to verbally express understanding of the relationship between feelings of depression and their impact on thinking patterns and behaviors.  Pt to verbalize an understanding of the role that distorted thinking plays in creating fears, excessive worry, and ruminations.  Progress: 40% Target Date:  11/17/2024 Frequency:  Bi-weekly Modality:  Cognitive Behavioral Therapy Interventions by Therapist:  Therapist will use CBT, Mindfulness exercises, Coping skills and Referrals, as needed by client. Client has verbally approved this treatment plan.  Francis KATHEE Macintosh, [facility]

## 2024-06-04 ENCOUNTER — Telehealth (INDEPENDENT_AMBULATORY_CARE_PROVIDER_SITE_OTHER): Admitting: Psychology

## 2024-06-04 DIAGNOSIS — F4323 Adjustment disorder with mixed anxiety and depressed mood: Secondary | ICD-10-CM | POA: Diagnosis not present

## 2024-06-04 DIAGNOSIS — F5089 Other specified eating disorder: Secondary | ICD-10-CM

## 2024-06-04 DIAGNOSIS — Z634 Disappearance and death of family member: Secondary | ICD-10-CM | POA: Diagnosis not present

## 2024-06-04 NOTE — Progress Notes (Signed)
 Office: [phone]  /  Fax: [phone]    Date: June 04, 2024  Appointment Start Time: 3:53pm Duration: 54 minutes Provider: Wyatt Fire, Psy.D. Type of Session: Individual Therapy  Location of Patient: Home (private location) Location of Provider: Provider's Home (private office) Type of Contact: Telepsychological Visit via MyChart Video Visit  Session Content: Kenneth Green is a 58 y.o. male presenting for a follow-up appointment to address the previously established treatment goal of increasing coping skills.Today's appointment was a telepsychological visit. Kenneth Green provided verbal consent for today's telepsychological appointment and he is aware he is responsible for securing confidentiality on his end of the session. Prior to proceeding with today's appointment, Kenneth Green's physical location at the time of this appointment was obtained as well a phone number he could be reached at in the event of technical difficulties. Kenneth Green and this provider participated in today's telepsychological service.   This provider conducted a brief check-in. Kenneth Green stated his medications were adjusted due to high blood pressure, adding he will discuss further with his PCP during his annual next week. He noted challenges with his weight loss, adding the last couple weeks I've just been in a funk. Further explored and processed. He indicated a belief that in the past month his motivation to be consistent with his lifestyle change has diminished. This provider asked Kenneth Green to describe how motivated he is to make a change on a scale of 1 to 10 (i.e., At 1, no motivation to change, and 10 there is no doubt about wanting to re-commit to the lifestyle change). Explored what his reasons were for picking 7. His motivation was re-evaluated as he acknowledged he does not want to see the bad side of things. He noted his motivation as a 5. Explored what can be done to increase from 5 to 6 or 7. This provider also used  hypothetical questions about best and worst case scenarios. Kenneth Green agreed to develop a Theatre stage manager list to ensure he has what he needs to eat congruent to his goals. Overall, Kenneth Green was receptive to today's appointment as evidenced by openness to sharing and responsiveness to feedback. He was also observed smiling at the end of today's appointment.   Mental Status Examination:  Appearance: neat Behavior: appropriate to circumstances Mood: sad Affect: mood congruent Speech: WNL Eye Contact: appropriate Psychomotor Activity: WNL Gait: unable to assess Thought Process: linear, logical, and goal directed and no evidence or endorsement of suicidal, homicidal, and self-harm ideation, plan and intent  Thought Content/Perception: no hallucinations, delusions, bizarre thinking or behavior endorsed or observed Orientation: AAOx4 Memory/Concentration: intact Insight: good Judgment: good  Interventions:  Conducted a brief chart review Provided empathic reflections and validation Reviewed content from the previous session Provided positive reinforcement Employed supportive psychotherapy interventions to facilitate reduced distress and to improve coping skills with identified stressors Employed motivational interviewing skills to assess patient's willingness/desire to adhere to recommended medical treatments and assignments  DSM-5 Diagnosis(es): F50.89 Other Specified Feeding or Eating Disorder, Emotional Eating Behaviors, 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 Kenneth Green has demonstrated progress in his goal as evidenced by increased awareness of hunger patterns, increased awareness of triggers for emotional eating behaviors, and reduction in emotional eating behaviors. Kenneth Green also  continues to demonstrate willingness to engage in learned skills (e.g., radical acceptance, thought challenging).   Plan: The next appointment is scheduled  for 06/25/2024 at 4pm, which will be via MyChart Video Visit. The next session will focus on working towards the established treatment goal. Kenneth Green will continue with his primary therapist.    Wyatt Fire, PsyD

## 2024-06-12 ENCOUNTER — Encounter (INDEPENDENT_AMBULATORY_CARE_PROVIDER_SITE_OTHER): Payer: Self-pay | Admitting: Family Medicine

## 2024-06-12 ENCOUNTER — Other Ambulatory Visit (INDEPENDENT_AMBULATORY_CARE_PROVIDER_SITE_OTHER): Payer: Self-pay | Admitting: Family Medicine

## 2024-06-12 ENCOUNTER — Other Ambulatory Visit (HOSPITAL_COMMUNITY): Payer: Self-pay

## 2024-06-12 ENCOUNTER — Ambulatory Visit (INDEPENDENT_AMBULATORY_CARE_PROVIDER_SITE_OTHER): Payer: Self-pay | Admitting: Family Medicine

## 2024-06-12 VITALS — BP 131/86 | HR 76 | Temp 97.9°F | Ht 69.0 in | Wt 292.0 lb

## 2024-06-12 DIAGNOSIS — E119 Type 2 diabetes mellitus without complications: Secondary | ICD-10-CM | POA: Diagnosis not present

## 2024-06-12 DIAGNOSIS — E669 Obesity, unspecified: Secondary | ICD-10-CM

## 2024-06-12 DIAGNOSIS — E559 Vitamin D deficiency, unspecified: Secondary | ICD-10-CM | POA: Diagnosis not present

## 2024-06-12 DIAGNOSIS — Z6841 Body Mass Index (BMI) 40.0 and over, adult: Secondary | ICD-10-CM

## 2024-06-12 DIAGNOSIS — E782 Mixed hyperlipidemia: Secondary | ICD-10-CM

## 2024-06-12 DIAGNOSIS — Z7985 Long-term (current) use of injectable non-insulin antidiabetic drugs: Secondary | ICD-10-CM

## 2024-06-12 DIAGNOSIS — E785 Hyperlipidemia, unspecified: Secondary | ICD-10-CM

## 2024-06-12 DIAGNOSIS — E1169 Type 2 diabetes mellitus with other specified complication: Secondary | ICD-10-CM

## 2024-06-12 DIAGNOSIS — E66813 Obesity, class 3: Secondary | ICD-10-CM

## 2024-06-12 MED ORDER — ROSUVASTATIN CALCIUM 5 MG PO TABS
5.0000 mg | ORAL_TABLET | Freq: Every day | ORAL | 0 refills | Status: DC
  Filled 2024-06-12: qty 30, 30d supply, fill #0

## 2024-06-12 MED ORDER — MOUNJARO 7.5 MG/0.5ML ~~LOC~~ SOAJ
7.5000 mg | SUBCUTANEOUS | 0 refills | Status: DC
  Filled 2024-06-12: qty 2, 28d supply, fill #0

## 2024-06-12 MED ORDER — VITAMIN D (ERGOCALCIFEROL) 1.25 MG (50000 UNIT) PO CAPS
50000.0000 [IU] | ORAL_CAPSULE | ORAL | 0 refills | Status: DC
  Filled 2024-06-12: qty 4, 28d supply, fill #0

## 2024-06-12 NOTE — Progress Notes (Signed)
 Office: [phone]  /  Fax: [phone]  WEIGHT SUMMARY AND BIOMETRICS  Anthropometric Measurements Height: 5' 9 (1.753 m) Weight: 292 lb (132.5 kg) BMI (Calculated): 43.1 Weight at Last Visit: 299 lb Weight Lost Since Last Visit: 7 lb Weight Gained Since Last Visit: 0 Starting Weight: 308 lb Total Weight Loss (lbs): 16 lb (7.258 kg) Peak Weight: 308 lb   Body Composition  Body Fat %: 37.7 % Fat Mass (lbs): 110.2 lbs Muscle Mass (lbs): 173.2 lbs Total Body Water (lbs): 125.4 lbs Visceral Fat Rating : 25   Other Clinical Data Fasting: no Labs: no Today's Visit #: 16 Starting Date: 04/10/19    Chief Complaint: OBESITY   History of Present Illness Kenneth Green is a 58 year old male who presents for a follow-up on his obesity treatment and progress.  He is adhering to a category three eating plan approximately 75% of the time, focusing on meal prepping and grocery shopping, which has reduced his frequency of eating out and helped him save money. He does not often experience hunger, making it challenging to eat when not hungry. He attributes his reduced portion sizes to Mounjaro  and increased protein intake.  He engages in physical activity by walking for 45 minutes, five times a week, primarily on a dock at work, which is 0.9 miles around. He completes three to four laps per night. The walking area is not climate-controlled, and he acknowledges inadequate hydration, finding it difficult to drink large amounts of water. He experiences frequent urination after drinking water, which he finds discouraging.  He has lost seven pounds in the last month since his last visit. He reports no gastrointestinal issues, except for mild constipation that does not require treatment.  His current medications include Mounjaro , Crestor , and vitamin D . He is no longer taking metformin . He recently received a pneumonia vaccine and reports soreness in his right arm.  He is  actively involved in various events and activities, which keep him busy throughout the year. He has a supportive work environment that allows him time off for these activities.      PHYSICAL EXAM:  Blood pressure 131/86, pulse 76, temperature 97.9 F (36.6 C), height 5' 9 (1.753 m), weight 292 lb (132.5 kg), SpO2 97%. Body mass index is 43.12 kg/m.  DIAGNOSTIC DATA REVIEWED:  BMET    Component Value Date/Time   NA 141 04/30/2024 1409   K 4.4 04/30/2024 1409   CL 101 04/30/2024 1409   CO2 24 04/30/2024 1409   GLUCOSE 87 04/30/2024 1409   GLUCOSE 106 (H) 06/15/2016 1700   BUN 16 04/30/2024 1409   CREATININE 1.07 04/30/2024 1409   CALCIUM  10.2 04/30/2024 1409   GFRNONAA 74 05/11/2021 0000   GFRAA 91 01/15/2021 1322   Lab Results  Component Value Date   HGBA1C 5.5 04/30/2024   HGBA1C 5.9 (H) 04/10/2019   Lab Results  Component Value Date   INSULIN  37.1 (H) 04/30/2024   INSULIN  30.9 (H) 04/10/2019   Lab Results  Component Value Date   TSH 1.020 02/22/2023   CBC    Component Value Date/Time   WBC 5.1 02/22/2023 1540   WBC 7.7 06/15/2016 1700   RBC 5.03 02/22/2023 1540   RBC 4.87 06/15/2016 1700   HGB 14.2 02/22/2023 1540   HCT 43.3 02/22/2023 1540   PLT 205 02/22/2023 1540   MCV 86 02/22/2023 1540   MCH 28.2 02/22/2023 1540   MCH 29.0 06/15/2016 1700   MCHC 32.8 02/22/2023  1540   MCHC 34.6 06/15/2016 1700   RDW 13.3 02/22/2023 1540   Iron Studies No results found for: IRON, TIBC, FERRITIN, IRONPCTSAT Lipid Panel     Component Value Date/Time   CHOL 175 04/30/2024 1409   TRIG 120 04/30/2024 1409   HDL 47 04/30/2024 1409   LDLCALC 106 (H) 04/30/2024 1409   Hepatic Function Panel     Component Value Date/Time   PROT 6.6 04/30/2024 1409   ALBUMIN 4.3 04/30/2024 1409   AST 20 04/30/2024 1409   ALT 26 04/30/2024 1409   ALKPHOS 63 04/30/2024 1409   BILITOT 0.3 04/30/2024 1409      Component Value Date/Time   TSH 1.020 02/22/2023 1540    Nutritional Lab Results  Component Value Date   VD25OH 37.8 04/30/2024   VD25OH 41.2 10/04/2023   VD25OH 33.6 02/22/2023     Assessment and Plan Assessment & Plan Type 2 Diabetes Mellitus He is managing diabetes with Mounjaro  and dietary changes. Glucose level is 103 mg/dL, within target range. - Continue Mounjaro   Obesity He adheres to a category three eating plan 75% of the time, resulting in a 7-pound weight loss over the last month. He exercises by walking 45 minutes, five times per week. Improved meal planning and grocery shopping have facilitated adherence. Reduced hunger and smaller portion sizes are noted, likely due to Mounjaro  and increased protein intake. Mild constipation is reported, with suboptimal hydration. - Continue category three eating plan - Continue Mounjaro  - Encourage continued exercise regimen - Discuss recipe ideas and meal planning - Encourage hydration with non-caffeinated, low-calorie fluids  Hyperlipidemia He is on Crestor  to manage cholesterol levels, which is crucial for reducing cardiovascular risk in diabetes. - Continue Crestor  - Refill Crestor  prescription  Vitamin D  Deficiency He is on vitamin D  supplementation with recent labs reviewed. - Continue vitamin D  supplementation - Refill vitamin D  prescription  General Health Maintenance He received a pneumonia vaccine with minor arm soreness. Hydration strategies were discussed, emphasizing non-caffeinated, low-calorie fluids.  Follow-up A follow-up appointment is scheduled for August 26, with plans to schedule another for September or October. - Schedule follow-up appointment for September or October    He was informed of the importance of frequent follow up visits to maximize his success with intensive lifestyle modifications for his multiple health conditions.    [name], MD

## 2024-06-13 ENCOUNTER — Other Ambulatory Visit (HOSPITAL_COMMUNITY): Payer: Self-pay

## 2024-06-21 ENCOUNTER — Other Ambulatory Visit (INDEPENDENT_AMBULATORY_CARE_PROVIDER_SITE_OTHER): Payer: Self-pay

## 2024-06-25 ENCOUNTER — Telehealth (INDEPENDENT_AMBULATORY_CARE_PROVIDER_SITE_OTHER): Admitting: Psychology

## 2024-06-26 ENCOUNTER — Ambulatory Visit: Admitting: Psychology

## 2024-06-26 DIAGNOSIS — F4323 Adjustment disorder with mixed anxiety and depressed mood: Secondary | ICD-10-CM | POA: Diagnosis not present

## 2024-06-26 NOTE — Progress Notes (Signed)
 [facility] Counselor/Therapist Progress Note  [name], MRN: [id number],    Date: 06/26/2024  Time Spent: 53 mins  start time: 1300   end time 1353  Treatment Type: Individual Therapy  Reported Symptoms: Pt presented via Caregility video for follow up session, stating his understanding of the limits of virtual visits and granting consent for the session.  Pt stated he was in his home and no one else was present.  I shared with pt that I am in my office and no one else is present.  Mental Status Exam: Appearance:  Casual     Behavior: Appropriate  Motor: Normal  Speech/Language:  Clear and Coherent  Affect: Appropriate  Mood: normal  Thought process: normal  Thought content:   WNL  Sensory/Perceptual disturbances:   WNL  Orientation: oriented to person, place, and time/date  Attention: Good  Concentration: Good  Memory: WNL  Fund of knowledge:  Good  Insight:   Good  Judgment:  Good  Impulse Control: Good   Risk Assessment: Danger to Self:  No Self-injurious Behavior: No Danger to Others: No Duty to Warn:no Physical Aggression / Violence:No  Access to Firearms a concern: No  Gang Involvement:No   Subjective: Pt shares that I have been doing pretty well since our last session, except I have been sick for the past few days.  I am going to the urgent care after we finish today.  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  Pt shares that he has been doing things that have been good for him (i.e., getting new gutter guards on his gutters, donating lots of [name]'s craft supplies, buying himself things that he would not have before, spending more time on his deck, etc.).  Pt shares that his daughter, Kenneth Green, has been named the executor of a friend's will in [place] and she is going to see her in a rehab this weekend.  Pt and [name] have had some serious, adult conversations out  on the deck lately and those have been really good.  He is still doing a lot with the [place] and [employer] and I am staying really busy with that; we have at least 2-3 events each month through November.  And we have a lot of Christmas events too.  He has even made a newsletter for the group and is enjoying that activity.  Pt shares that Kenneth Green is super busy with work and family; the grand kids are doing well; he keeps his grandson Insurance claims handler) on Mondays and he enjoys that time.  He is noticing that she is picking up some not great qualities that her mom displayed; he points out her behavior when he feels it is not appropriate.  Both grand kids' birthdays are coming up (Kenneth Green will be 1 yo and his sister will be 60 yo).  Pt shares that he is enjoying all of the things he is doing around the house and with the [place] and [employer] that are keeping him busy.  Pt has found that he is having some success with celebrating memories of Kenneth Green as he continues to grieve.  Pt shares that his mom has recovered from her fall and is doing well.  Pt shares that he has re-started drinking but is currently doing a good job of managing his alcohol use and it is not a problem for him at this point.  Encouraged  pt to continue with his self care activities and we will meet in 4 wks for a follow up session.  Interventions: Cognitive Behavioral Therapy  Diagnosis:Adjustment disorder with mixed anxiety and depressed mood  Plan: Treatment Plan Strengths/Abilities:  Intelligent, Intuitive, Willing to participate in therapy Treatment Preferences:  Outpatient Individual Therapy Statement of Needs:  Patient is to use CBT, mindfulness and coping skills to help manage and/or decrease symptoms associated with their diagnosis. Symptoms:  Depressed/Irritable mood, worry, social withdrawal Problems Addressed:  Depressive thoughts, Sadness, Sleep issues, etc. Long Term Goals:  Pt to reduce overall level, frequency, and intensity  of the feelings of depression as evidenced by decreased irritability, negative self talk, and helpless feelings from 6 to 7 days/week to 0 to 1 days/week, per client report, for at least 3 consecutive months.  Progress: 40% Short Term Goals:  Pt to verbally express understanding of the relationship between feelings of depression and their impact on thinking patterns and behaviors.  Pt to verbalize an understanding of the role that distorted thinking plays in creating fears, excessive worry, and ruminations.  Progress: 40% Target Date:  11/17/2024 Frequency:  Bi-weekly Modality:  Cognitive Behavioral Therapy Interventions by Therapist:  Therapist will use CBT, Mindfulness exercises, Coping skills and Referrals, as needed by client. Client has verbally approved this treatment plan.  Francis KATHEE Macintosh, [facility]

## 2024-07-10 ENCOUNTER — Other Ambulatory Visit (HOSPITAL_COMMUNITY): Payer: Self-pay

## 2024-07-10 ENCOUNTER — Other Ambulatory Visit (INDEPENDENT_AMBULATORY_CARE_PROVIDER_SITE_OTHER): Payer: Self-pay | Admitting: Family Medicine

## 2024-07-10 ENCOUNTER — Telehealth (INDEPENDENT_AMBULATORY_CARE_PROVIDER_SITE_OTHER): Admitting: Psychology

## 2024-07-10 DIAGNOSIS — F5089 Other specified eating disorder: Secondary | ICD-10-CM | POA: Diagnosis not present

## 2024-07-10 DIAGNOSIS — F4323 Adjustment disorder with mixed anxiety and depressed mood: Secondary | ICD-10-CM

## 2024-07-10 DIAGNOSIS — Z634 Disappearance and death of family member: Secondary | ICD-10-CM | POA: Diagnosis not present

## 2024-07-10 DIAGNOSIS — E1169 Type 2 diabetes mellitus with other specified complication: Secondary | ICD-10-CM

## 2024-07-10 NOTE — Progress Notes (Signed)
  Office: [phone]  /  Fax: [phone]    Date: July 10, 2024  Appointment Start Time: 11:37am Duration: 27 minutes Provider: Wyatt Green, Psy.D. Type of Session: Individual Therapy  Location of Patient: Home (private location) Location of Provider: Provider's Home (private office) Type of Contact: Telepsychological Visit via MyChart Video Visit  Session Content: Kenneth Green is a 58 y.o. male presenting for a follow-up appointment to address the previously established treatment goal of increasing coping skills. Today's appointment was a telepsychological visit. Kenneth Green provided verbal consent for today's telepsychological appointment and he is aware he is responsible for securing confidentiality on his end of the session. Prior to proceeding with today's appointment, Kenneth Green's physical location at the time of this appointment was obtained as well a phone number he could be reached at in the event of technical difficulties. Kenneth Green and this provider participated in today's telepsychological service.   This provider conducted a brief check-in. Kenneth Green shared he is doing better, noting he had an upper respiratory infection for two weeks. Despite the aforementioned, he indicated he is focusing on making better choices, noting recent weight loss. He also described engagement in hobbies. Explored what helped Kenneth Green increase adherence to his goals with the clinic. He identified and wrote the following for future reference: utilizing a master grocery list, increasing engagement in pleasurable activities, focusing on what brings him joy by ceasing engagement in activities/commitments that contributed to stress, spending time outdoors, spending time with daughters, and finding his new normal after his wife's passing. Furthermore, termination planning was discussed. Kenneth Green was receptive to a follow-up appointment in 3-4 weeks and an additional follow-up/termination appointment in 3-4 weeks after that.  Overall, Kenneth Green was receptive to today's appointment as evidenced by openness to sharing, responsiveness to feedback, and willingness to continue engaging in learned skills.  Mental Status Examination:  Appearance: neat Behavior: appropriate to circumstances Mood: neutral Affect: mood congruent Speech: WNL Eye Contact: appropriate Psychomotor Activity: WNL Gait: unable to assess Thought Process: linear, logical, and goal directed and no evidence or endorsement of suicidal, homicidal, and self-harm ideation, plan and intent  Thought Content/Perception: no hallucinations, delusions, bizarre thinking or behavior endorsed or observed Orientation: AAOx4 Memory/Concentration: intact Insight: good Judgment: good  Interventions:  Conducted a brief chart review Provided empathic reflections and validation Reviewed content from the previous session Provided positive reinforcement Employed supportive psychotherapy interventions to facilitate reduced distress and to improve coping skills with identified stressors Discussed termination planning  DSM-5 Diagnosis(es): F50.89 Other Specified Feeding or Eating Disorder, Emotional Eating Behaviors, 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 Kenneth Green has demonstrated progress in his goal as evidenced by increased awareness of hunger patterns, increased awareness of triggers for emotional eating behaviors, and reduction in emotional eating behaviors. Kenneth Green also continues to demonstrate willingness to engage in learned skills (e.g., radical acceptance, thought challenging).    Plan: The next appointment is scheduled for 08/07/2024 at 2pm, which will be via MyChart Video Visit. The next session will focus on working towards the established treatment  goal. Yarnell will continue with his primary therapist.     Kenneth Fire, PsyD

## 2024-07-13 ENCOUNTER — Other Ambulatory Visit (HOSPITAL_COMMUNITY): Payer: Self-pay

## 2024-07-17 ENCOUNTER — Encounter (INDEPENDENT_AMBULATORY_CARE_PROVIDER_SITE_OTHER): Payer: Self-pay | Admitting: Family Medicine

## 2024-07-17 ENCOUNTER — Other Ambulatory Visit (HOSPITAL_COMMUNITY): Payer: Self-pay

## 2024-07-17 ENCOUNTER — Ambulatory Visit (INDEPENDENT_AMBULATORY_CARE_PROVIDER_SITE_OTHER): Payer: Self-pay | Admitting: Family Medicine

## 2024-07-17 VITALS — BP 131/87 | HR 68 | Temp 97.8°F | Ht 69.0 in | Wt 292.0 lb

## 2024-07-17 DIAGNOSIS — E66813 Obesity, class 3: Secondary | ICD-10-CM

## 2024-07-17 DIAGNOSIS — E119 Type 2 diabetes mellitus without complications: Secondary | ICD-10-CM | POA: Diagnosis not present

## 2024-07-17 DIAGNOSIS — E559 Vitamin D deficiency, unspecified: Secondary | ICD-10-CM

## 2024-07-17 DIAGNOSIS — Z6841 Body Mass Index (BMI) 40.0 and over, adult: Secondary | ICD-10-CM

## 2024-07-17 DIAGNOSIS — E669 Obesity, unspecified: Secondary | ICD-10-CM

## 2024-07-17 DIAGNOSIS — T50905A Adverse effect of unspecified drugs, medicaments and biological substances, initial encounter: Secondary | ICD-10-CM

## 2024-07-17 DIAGNOSIS — E782 Mixed hyperlipidemia: Secondary | ICD-10-CM | POA: Diagnosis not present

## 2024-07-17 DIAGNOSIS — E1169 Type 2 diabetes mellitus with other specified complication: Secondary | ICD-10-CM

## 2024-07-17 DIAGNOSIS — T433X5A Adverse effect of phenothiazine antipsychotics and neuroleptics, initial encounter: Secondary | ICD-10-CM

## 2024-07-17 DIAGNOSIS — R442 Other hallucinations: Secondary | ICD-10-CM

## 2024-07-17 DIAGNOSIS — Z7985 Long-term (current) use of injectable non-insulin antidiabetic drugs: Secondary | ICD-10-CM

## 2024-07-17 MED ORDER — ROSUVASTATIN CALCIUM 5 MG PO TABS
5.0000 mg | ORAL_TABLET | Freq: Every day | ORAL | 0 refills | Status: DC
  Filled 2024-07-17: qty 30, 30d supply, fill #0

## 2024-07-17 MED ORDER — VITAMIN D (ERGOCALCIFEROL) 1.25 MG (50000 UNIT) PO CAPS
50000.0000 [IU] | ORAL_CAPSULE | ORAL | 0 refills | Status: DC
  Filled 2024-07-17: qty 4, 28d supply, fill #0

## 2024-07-17 MED ORDER — MOUNJARO 7.5 MG/0.5ML ~~LOC~~ SOAJ
7.5000 mg | SUBCUTANEOUS | 0 refills | Status: DC
Start: 2024-07-17 — End: 2024-09-11
  Filled 2024-07-17: qty 2, 28d supply, fill #0

## 2024-07-17 NOTE — Progress Notes (Signed)
 Office: [phone]  /  Fax: [phone]  WEIGHT SUMMARY AND BIOMETRICS  Anthropometric Measurements Height: 5' 9 (1.753 m) Weight: 292 lb (132.5 kg) BMI (Calculated): 43.1 Weight at Last Visit: 292 lb Weight Lost Since Last Visit: 0 Weight Gained Since Last Visit: 0 Starting Weight: 308 lb Total Weight Loss (lbs): 16 lb (7.258 kg) Peak Weight: 308 lb   Body Composition  Body Fat %: 38.3 % Fat Mass (lbs): 112.2 lbs Muscle Mass (lbs): 171.8 lbs Total Body Water (lbs): 125.8 lbs Visceral Fat Rating : 26   Other Clinical Data Fasting: yes Labs: no Today's Visit #: 17 Starting Date: 04/10/19    Chief Complaint: OBESITY    History of Present Illness Kenneth Green is a 58 year old male with obesity, vitamin D  deficiency, type 2 diabetes, and mixed hyperlipidemia who presents for a follow-up on his obesity management and vitamin D  deficiency.  He is focused on managing his obesity and adheres to a category three eating plan approximately 80% of the time. He engages in physical activity by walking for 45 minutes, five days a week. Despite these efforts, his weight has remained stable over the last month. He is working on increasing his intake of fruits, vegetables, and protein, and is making an effort not to skip meals. He sleeps seven to nine hours per night but has noted inadequate hydration recently. He typically drinks four to five bottles of water a day but sweats profusely, losing a significant amount of salt, which he notices on his clothing after sweating.  He is addressing his vitamin D  deficiency with a prescription of 50,000 IU weekly and requests a refill.  His type 2 diabetes is managed with Mounjaro  7.5 mg weekly, and he requests a refill. He did not experience nausea with this medication and continued taking it while he was sick.  He has mixed hyperlipidemia associated with type 2 diabetes and is on Crestor  5 mg, requesting a refill. He is working  on diet, exercise, and weight loss as part of his management plan.  Recently, he experienced an upper respiratory infection lasting two and a half weeks, during which he was bedridden for two weeks. He was tested for COVID, RSV, and the flu, all of which were negative. He experienced severe body aches, no energy, and a raspy cough but no sore throat or fever. His pulse oximetry dropped to the low nineties for four to five days. He was prescribed a cough medication, promethazine DM, which caused hallucinations, and he decided not to take it again. He also used a nasal spray during this time. Despite the illness, he maintained his weight and tried to consume more protein.      PHYSICAL EXAM:  Blood pressure 131/87, pulse 68, temperature 97.8 F (36.6 C), height 5' 9 (1.753 m), weight 292 lb (132.5 kg), SpO2 97%. Body mass index is 43.12 kg/m.  DIAGNOSTIC DATA REVIEWED:  BMET    Component Value Date/Time   NA 139 05/14/2024 0000   K 3.9 05/14/2024 0000   CL 104 05/14/2024 0000   CO2 26 (A) 05/14/2024 0000   GLUCOSE 87 04/30/2024 1409   GLUCOSE 106 (H) 06/15/2016 1700   BUN 12 05/14/2024 0000   CREATININE 1.2 05/14/2024 0000   CREATININE 1.07 04/30/2024 1409   CALCIUM  10.3 05/14/2024 0000   GFRNONAA 74 05/11/2021 0000   GFRAA 91 01/15/2021 1322   Lab Results  Component Value Date   HGBA1C 5.5 04/30/2024   HGBA1C 5.9 (  H) 04/10/2019   Lab Results  Component Value Date   INSULIN  37.1 (H) 04/30/2024   INSULIN  30.9 (H) 04/10/2019   Lab Results  Component Value Date   TSH 0.91 05/14/2024   CBC    Component Value Date/Time   WBC 7.1 05/14/2024 0000   WBC 7.7 06/15/2016 1700   RBC 4.92 05/14/2024 0000   HGB 13.9 05/14/2024 0000   HGB 14.2 02/22/2023 1540   HCT 42 05/14/2024 0000   HCT 43.3 02/22/2023 1540   PLT 254 05/14/2024 0000   PLT 205 02/22/2023 1540   MCV 86 02/22/2023 1540   MCH 28.2 02/22/2023 1540   MCH 29.0 06/15/2016 1700   MCHC 32.8 02/22/2023 1540    MCHC 34.6 06/15/2016 1700   RDW 13.3 02/22/2023 1540   Iron Studies No results found for: IRON, TIBC, FERRITIN, IRONPCTSAT Lipid Panel     Component Value Date/Time   CHOL 175 04/30/2024 1409   TRIG 120 04/30/2024 1409   HDL 47 04/30/2024 1409   LDLCALC 106 (H) 04/30/2024 1409   Hepatic Function Panel     Component Value Date/Time   PROT 6.6 04/30/2024 1409   ALBUMIN 4.5 05/14/2024 0000   ALBUMIN 4.3 04/30/2024 1409   AST 20 05/14/2024 0000   ALT 27 05/14/2024 0000   ALKPHOS 58 05/14/2024 0000   BILITOT 0.3 04/30/2024 1409      Component Value Date/Time   TSH 0.91 05/14/2024 0000   TSH 1.020 02/22/2023 1540   Nutritional Lab Results  Component Value Date   VD25OH 37.8 04/30/2024   VD25OH 41.2 10/04/2023   VD25OH 33.6 02/22/2023     Assessment and Plan Assessment & Plan Obesity Obesity management with a focus on maintaining weight loss. Following a category three eating plan with 80% adherence. Engaging in physical activity with 45 minutes of walking five days a week. Working on increasing fruit and vegetable intake and meeting protein goals. Experiencing challenges with hydration, which is important for overall health and weight management. Discussed the importance of hydration, especially given his profuse sweating and fluid loss, and strategies to improve water intake. - Continue category three eating plan with focus on protein intake first, followed by vegetables, then carbohydrates. - Encourage hydration with a goal of at least 100 ounces daily, considering sweating and fluid loss. - Suggest using strategies like setting goals for water intake at specific times of the day or using flavored water enhancers without calories. - Monitor weight and dietary intake, focusing on protein and hydration. - Assess hunger and eating patterns over the next month to determine if Mounjaro  dose needs adjustment.  Type 2 diabetes mellitus Type 2 diabetes managed with  Mounjaro  7.5 mg weekly. No nausea reported with current dose. Monitoring hunger and dietary intake to assess effectiveness of current dose. Discussed the importance of protein intake to manage hunger and support metabolism. - Refill Mounjaro  7.5 mg weekly. - Monitor hunger and dietary intake to assess if dose adjustment is needed at next visit. - Continue diet, exercise and weight loss as discussed today as an important part of the treatment plan   Mixed hyperlipidemia Mixed hyperlipidemia associated with type 2 diabetes. Managed with Crestor  5 mg daily. He is working on diet, exercise, and weight loss as part of management. - Refill Crestor  5 mg daily. - Continue diet, exercise and weight loss as discussed today as an important part of the treatment plan  Vitamin D  deficiency Vitamin D  deficiency managed with prescription vitamin D  50,000  IU weekly. - Refill vitamin D  50,000 IU weekly.  Adverse reaction to promethazine with codeine (hallucinations) Adverse reaction to promethazine with codeine characterized by visual hallucinations. He will avoid this medication in the future. - Document adverse reaction to promethazine with codeine in medical record.    He was informed of the importance of frequent follow up visits to maximize his success with intensive lifestyle modifications for his multiple health conditions.    [name], MD

## 2024-07-31 ENCOUNTER — Ambulatory Visit (INDEPENDENT_AMBULATORY_CARE_PROVIDER_SITE_OTHER): Admitting: Psychology

## 2024-07-31 DIAGNOSIS — F4323 Adjustment disorder with mixed anxiety and depressed mood: Secondary | ICD-10-CM | POA: Diagnosis not present

## 2024-07-31 NOTE — Progress Notes (Signed)
 [facility] Counselor/Therapist Progress Note  [name], MRN: [id number],    Date: 07/31/2024  Time Spent: 60 mins  start time: 1400   end time 1500  Treatment Type: Individual Therapy  Reported Symptoms: Pt presented via Caregility video for follow up session, stating his understanding of the limits of virtual visits and granting consent for the session.  Pt stated he was in his home and no one else was present.  I shared with pt that I am in my office and no one else is present.  Mental Status Exam: Appearance:  Casual     Behavior: Appropriate  Motor: Normal  Speech/Language:  Clear and Coherent  Affect: Appropriate  Mood: normal  Thought process: normal  Thought content:   WNL  Sensory/Perceptual disturbances:   WNL  Orientation: oriented to person, place, and time/date  Attention: Good  Concentration: Good  Memory: WNL  Fund of knowledge:  Good  Insight:   Good  Judgment:  Good  Impulse Control: Good   Risk Assessment: Danger to Self:  No Self-injurious Behavior: No Danger to Others: No Duty to Warn:no Physical Aggression / Violence:No  Access to Firearms a concern: No  Gang Involvement:No   Subjective: Pt shares that I have been doing pretty good since our last session.  Nothing really new since then.  Work has been kind of slow but is going well.  Things at home are quiet too.  Pt shares that his daughter, [name], might be impacted by staffing/financial issues at [employer] schools; by this Friday she should know if she will be impacted.  Pt shares that his mom is doing OK; she had a bit of a health scare with a UTI.  She is better now and he is thankful for that.  Pt shares that he is struggling with motivation; I know there are things that need to be done and I am just not motivated to do them.  Talked with pt about giving himself a break and allow himself to take a break, especially if those things are not deadline involved.  Pt has  been thinking about travelling recently as well.  Pt shares that he and Winton were really careful with their money and saved a bit.  He would like to think about benefiting from the money now.  Encouraged pt to begin thinking about how he might want his retirement to be; encouraged pt to talk with his financial advisor to see where he stands right now.  Pt shares that he sold their fleeta last week; he encountered work with the [employer] that he needed to do related to [name]'s passing.  He has learned that he has other actions he needs to take for other issues related to her death as well.   He is still doing a lot with the [place] and [employer] and I am staying really busy with that; we have at least 2-3 events each month through November.  And we have a lot of Christmas events too.  Pt shares that [name] is busy with work and family; the grand kids are doing well; he keeps his grandson Insurance claims handler) on Mondays and he enjoys that time.  Both grand kids' birthdays are coming up ([name] will be 1 yo and his sister will be 21 yo).  Pt shares that he has re-started drinking but is currently doing a good job of managing his alcohol use and it is not a problem for him at this point.  Encouraged pt to continue with his self care activities and we will meet in 4 wks for a follow up session.  Interventions: Cognitive Behavioral Therapy  Diagnosis:Adjustment disorder with mixed anxiety and depressed mood  Plan: Treatment Plan Strengths/Abilities:  Intelligent, Intuitive, Willing to participate in therapy Treatment Preferences:  Outpatient Individual Therapy Statement of Needs:  Patient is to use CBT, mindfulness and coping skills to help manage and/or decrease symptoms associated with their diagnosis. Symptoms:  Depressed/Irritable mood, worry, social withdrawal Problems Addressed:  Depressive thoughts, Sadness, Sleep issues, etc. Long Term Goals:  Pt to reduce overall level, frequency, and intensity of  the feelings of depression as evidenced by decreased irritability, negative self talk, and helpless feelings from 6 to 7 days/week to 0 to 1 days/week, per client report, for at least 3 consecutive months.  Progress: 40% Short Term Goals:  Pt to verbally express understanding of the relationship between feelings of depression and their impact on thinking patterns and behaviors.  Pt to verbalize an understanding of the role that distorted thinking plays in creating fears, excessive worry, and ruminations.  Progress: 40% Target Date:  11/17/2024 Frequency:  Bi-weekly Modality:  Cognitive Behavioral Therapy Interventions by Therapist:  Therapist will use CBT, Mindfulness exercises, Coping skills and Referrals, as needed by client. Client has verbally approved this treatment plan.  Francis KATHEE Macintosh, [facility]

## 2024-08-07 ENCOUNTER — Telehealth (INDEPENDENT_AMBULATORY_CARE_PROVIDER_SITE_OTHER): Admitting: Psychology

## 2024-08-07 DIAGNOSIS — Z634 Disappearance and death of family member: Secondary | ICD-10-CM | POA: Diagnosis not present

## 2024-08-07 DIAGNOSIS — F5089 Other specified eating disorder: Secondary | ICD-10-CM | POA: Diagnosis not present

## 2024-08-07 DIAGNOSIS — F4323 Adjustment disorder with mixed anxiety and depressed mood: Secondary | ICD-10-CM | POA: Diagnosis not present

## 2024-08-07 NOTE — Progress Notes (Signed)
  Office: [phone]  /  Fax: [phone]    Date: August 07, 2024  Appointment Start Time: 2:02pm Duration: 29 minutes Provider: Wyatt Fire, Psy.D. Type of Session: Individual Therapy  Location of Patient: Home (private location) Location of Provider: Provider's Home (private office) Type of Contact: Telepsychological Visit via MyChart Video Visit  Session Content: Anterrio is a 58 y.o. male presenting for a follow-up appointment to address the previously established treatment goal of increasing coping skills as well as termination. Today's appointment was a telepsychological visit. Ozell provided verbal consent for today's telepsychological appointment and he is aware he is responsible for securing confidentiality on his end of the session. Prior to proceeding with today's appointment, Alexandre's physical location at the time of this appointment was obtained as well a phone number he could be reached at in the event of technical difficulties. Ozell and this provider participated in today's telepsychological service.   This provider conducted a brief check-in. Kiefer shared, Everything has been going along pretty well. He noted concerns regarding his mother's health. Tiago further discussed feeling confident with his weight loss to date and ability to continue; therefore, described comfort with this being his last appointment with this provider. Reflected on progress to date. Of note, Timouthy stated he is experiencing apathy. Further explored. He clarified he continues to interact with family, but has withdrawn from friends; however, he stated he enjoy[s] being alone. He further explained that he is not feeling apathetic in any other aspects of his life. He also reflected on how recent changes have helped him with his overall well-being. Session concluded with a brief review of learned skills. Overall, Demerius was receptive to today's appointment as evidenced by openness to  sharing, responsiveness to feedback, and willingness to continue engaging in learned skills.  Mental Status Examination:  Appearance: neat Behavior: appropriate to circumstances Mood: neutral Affect: mood congruent Speech: WNL Eye Contact: appropriate Psychomotor Activity: WNL Gait: unable to assess Thought Process: linear, logical, and goal directed and no evidence or endorsement of suicidal, homicidal, and self-harm ideation, plan and intent  Thought Content/Perception: no hallucinations, delusions, bizarre thinking or behavior endorsed or observed Orientation: AAOx4 Memory/Concentration: intact Insight: good Judgment: good  Interventions:  Conducted a brief chart review Provided empathic reflections and validation Provided positive reinforcement Employed supportive psychotherapy interventions to facilitate reduced distress and to improve coping skills with identified stressors Reviewed learned skills  DSM-5 Diagnosis(es): F50.89 Other Specified Feeding or Eating Disorder, Emotional Eating Behaviors, 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 Melody demonstrated progress in his goal as evidenced by increased awareness of hunger patterns, increased awareness of triggers for emotional eating behaviors, and reduction in emotional eating behaviors. Wyatte also continues to demonstrate willingness to engage in learned skills (e.g., radical acceptance, thought challenging).   Plan: Today was Elhadji's last appointment with this provider. Jayvyn will continue with his primary therapist.  He acknowledged understanding that he may request a follow-up appointment with this provider in the future as long as he is still established with the clinic. No further follow-up planned by this  provider.    Wyatt Fire, PsyD

## 2024-08-14 ENCOUNTER — Encounter (INDEPENDENT_AMBULATORY_CARE_PROVIDER_SITE_OTHER): Payer: Self-pay | Admitting: Family Medicine

## 2024-08-14 ENCOUNTER — Other Ambulatory Visit (HOSPITAL_COMMUNITY): Payer: Self-pay

## 2024-08-14 ENCOUNTER — Ambulatory Visit (INDEPENDENT_AMBULATORY_CARE_PROVIDER_SITE_OTHER): Admitting: Family Medicine

## 2024-08-14 ENCOUNTER — Other Ambulatory Visit (INDEPENDENT_AMBULATORY_CARE_PROVIDER_SITE_OTHER): Payer: Self-pay | Admitting: Family Medicine

## 2024-08-14 VITALS — BP 135/85 | HR 70 | Temp 97.6°F | Ht 69.0 in | Wt 292.0 lb

## 2024-08-14 DIAGNOSIS — E119 Type 2 diabetes mellitus without complications: Secondary | ICD-10-CM

## 2024-08-14 DIAGNOSIS — E559 Vitamin D deficiency, unspecified: Secondary | ICD-10-CM | POA: Diagnosis not present

## 2024-08-14 DIAGNOSIS — E669 Obesity, unspecified: Secondary | ICD-10-CM

## 2024-08-14 DIAGNOSIS — E1169 Type 2 diabetes mellitus with other specified complication: Secondary | ICD-10-CM

## 2024-08-14 DIAGNOSIS — I10 Essential (primary) hypertension: Secondary | ICD-10-CM | POA: Diagnosis not present

## 2024-08-14 DIAGNOSIS — E782 Mixed hyperlipidemia: Secondary | ICD-10-CM | POA: Diagnosis not present

## 2024-08-14 DIAGNOSIS — Z6841 Body Mass Index (BMI) 40.0 and over, adult: Secondary | ICD-10-CM

## 2024-08-14 DIAGNOSIS — E66813 Obesity, class 3: Secondary | ICD-10-CM

## 2024-08-14 DIAGNOSIS — Z7985 Long-term (current) use of injectable non-insulin antidiabetic drugs: Secondary | ICD-10-CM

## 2024-08-14 MED ORDER — VITAMIN D (ERGOCALCIFEROL) 1.25 MG (50000 UNIT) PO CAPS
50000.0000 [IU] | ORAL_CAPSULE | ORAL | 0 refills | Status: DC
  Filled 2024-08-14: qty 4, 28d supply, fill #0

## 2024-08-14 MED ORDER — ROSUVASTATIN CALCIUM 5 MG PO TABS
5.0000 mg | ORAL_TABLET | Freq: Every day | ORAL | 0 refills | Status: DC
  Filled 2024-08-14: qty 30, 30d supply, fill #0

## 2024-08-14 MED ORDER — TIRZEPATIDE 10 MG/0.5ML ~~LOC~~ SOAJ
10.0000 mg | SUBCUTANEOUS | 0 refills | Status: DC
  Filled 2024-08-14: qty 2, 28d supply, fill #0
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
## 2024-08-21 ENCOUNTER — Other Ambulatory Visit (HOSPITAL_COMMUNITY): Payer: Self-pay

## 2024-08-28 ENCOUNTER — Ambulatory Visit: Admitting: Psychology

## 2024-08-28 DIAGNOSIS — F4323 Adjustment disorder with mixed anxiety and depressed mood: Secondary | ICD-10-CM
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
## 2024-09-11 ENCOUNTER — Ambulatory Visit (INDEPENDENT_AMBULATORY_CARE_PROVIDER_SITE_OTHER): Admitting: Family Medicine

## 2024-09-11 ENCOUNTER — Other Ambulatory Visit: Payer: Self-pay

## 2024-09-11 ENCOUNTER — Encounter (INDEPENDENT_AMBULATORY_CARE_PROVIDER_SITE_OTHER): Payer: Self-pay | Admitting: Family Medicine

## 2024-09-11 ENCOUNTER — Other Ambulatory Visit (HOSPITAL_COMMUNITY): Payer: Self-pay

## 2024-09-11 VITALS — BP 140/83 | HR 83 | Temp 97.8°F | Ht 69.0 in | Wt 298.0 lb

## 2024-09-11 DIAGNOSIS — E559 Vitamin D deficiency, unspecified: Secondary | ICD-10-CM

## 2024-09-11 DIAGNOSIS — F5101 Primary insomnia: Secondary | ICD-10-CM

## 2024-09-11 DIAGNOSIS — Z7985 Long-term (current) use of injectable non-insulin antidiabetic drugs: Secondary | ICD-10-CM

## 2024-09-11 DIAGNOSIS — E119 Type 2 diabetes mellitus without complications: Secondary | ICD-10-CM

## 2024-09-11 DIAGNOSIS — E669 Obesity, unspecified: Secondary | ICD-10-CM

## 2024-09-11 DIAGNOSIS — E1169 Type 2 diabetes mellitus with other specified complication: Secondary | ICD-10-CM

## 2024-09-11 DIAGNOSIS — G4700 Insomnia, unspecified: Secondary | ICD-10-CM | POA: Diagnosis not present

## 2024-09-11 DIAGNOSIS — Z6841 Body Mass Index (BMI) 40.0 and over, adult: Secondary | ICD-10-CM

## 2024-09-11 DIAGNOSIS — E782 Mixed hyperlipidemia: Secondary | ICD-10-CM

## 2024-09-11 DIAGNOSIS — F4321 Adjustment disorder with depressed mood: Secondary | ICD-10-CM

## 2024-09-11 DIAGNOSIS — F32A Depression, unspecified: Secondary | ICD-10-CM

## 2024-09-11 MED ORDER — ROSUVASTATIN CALCIUM 5 MG PO TABS
5.0000 mg | ORAL_TABLET | Freq: Every day | ORAL | 0 refills | Status: DC
Start: 2024-09-11 — End: 2024-10-08
  Filled 2024-09-11: qty 30, 30d supply, fill #0

## 2024-09-11 MED ORDER — VITAMIN D (ERGOCALCIFEROL) 1.25 MG (50000 UNIT) PO CAPS
50000.0000 [IU] | ORAL_CAPSULE | ORAL | 0 refills | Status: DC
Start: 2024-09-11 — End: 2024-10-08
  Filled 2024-09-11: qty 4, 28d supply, fill #0

## 2024-09-11 MED ORDER — TIRZEPATIDE 10 MG/0.5ML ~~LOC~~ SOAJ
10.0000 mg | SUBCUTANEOUS | 0 refills | Status: DC
  Filled 2024-09-11: qty 2, 28d supply, fill #0

## 2024-09-11 NOTE — Progress Notes (Signed)
 Office: [phone]  /  Fax: [phone]  WEIGHT SUMMARY AND BIOMETRICS  Anthropometric Measurements Height: 5' 9 (1.753 m) Weight: 298 lb (135.2 kg) BMI (Calculated): 43.99 Weight at Last Visit: 292 lb Weight Lost Since Last Visit: 0 Weight Gained Since Last Visit: 6 lb Starting Weight: 308 lb Total Weight Loss (lbs): 10 lb (4.536 kg) Peak Weight: 308 lb   Body Composition  Body Fat %: 39.3 % Fat Mass (lbs): 117.2 lbs Muscle Mass (lbs): 172.4 lbs Total Body Water (lbs): 128.2 lbs Visceral Fat Rating : 27   Other Clinical Data Fasting: yes Labs: no Today's Visit #: 19 Starting Date: 04/10/19    Chief Complaint: OBESITY    History of Present Illness Kenneth Green is a 58 year old male with obesity, hyperlipidemia, and type 2 diabetes who presents for obesity treatment and progress assessment.  He has been following the category four eating plan about two-thirds of the time and is walking 30 minutes three days a week. Despite these efforts, he has gained six pounds in the last month. He is currently on Crestor  5 mg for hyperlipidemia.  He is being treated for vitamin D  deficiency with prescription ergocalciferol  50,000 IU per week.  He has type 2 diabetes and is on Mounjaro  10 mg, which he reports is stable. He is working on decreasing simple carbohydrates to help control his blood sugars. No gastrointestinal issues with Mounjaro , and it is adequately controlling his excessive hunger. He is not experiencing cravings but has difficulty avoiding less ideal foods, which he attributes to recent emotional stress due to his mother's passing.  He reports difficulty with sleep, stating he has trouble getting to sleep and staying asleep. He uses a CPAP machine regularly. He has tried over-the-counter sleep aids in the past but found them ineffective due to grogginess. He experiences back pain, which worsens if he lays flat, but he manages it with an adjustable  bed.  He is currently on Wellbutrin  and Zoloft  50 mg for mood support. He describes feeling 'a little bit' hopeless and more unmotivated than anything else, following the recent death of his mother. He reports intermittent sleep disturbances and emotional stress related to this event.  Socially, his daughter is his closest support, and he sees her once or twice a week. He reports that work has been understanding of his situation.      PHYSICAL EXAM:  Blood pressure (!) 140/83, pulse 83, temperature 97.8 F (36.6 C), height 5' 9 (1.753 m), weight 298 lb (135.2 kg), SpO2 96%. Body mass index is 44.01 kg/m.  DIAGNOSTIC DATA REVIEWED:  BMET    Component Value Date/Time   NA 139 05/14/2024 0000   K 3.9 05/14/2024 0000   CL 104 05/14/2024 0000   CO2 26 (A) 05/14/2024 0000   GLUCOSE 87 04/30/2024 1409   GLUCOSE 106 (H) 06/15/2016 1700   BUN 12 05/14/2024 0000   CREATININE 1.2 05/14/2024 0000   CREATININE 1.07 04/30/2024 1409   CALCIUM  10.3 05/14/2024 0000   GFRNONAA 74 05/11/2021 0000   GFRAA 91 01/15/2021 1322   Lab Results  Component Value Date   HGBA1C 5.5 04/30/2024   HGBA1C 5.9 (H) 04/10/2019   Lab Results  Component Value Date   INSULIN  37.1 (H) 04/30/2024   INSULIN  30.9 (H) 04/10/2019   Lab Results  Component Value Date   TSH 0.91 05/14/2024   CBC    Component Value Date/Time   WBC 7.1 05/14/2024 0000   WBC 7.7  06/15/2016 1700   RBC 4.92 05/14/2024 0000   HGB 13.9 05/14/2024 0000   HGB 14.2 02/22/2023 1540   HCT 42 05/14/2024 0000   HCT 43.3 02/22/2023 1540   PLT 254 05/14/2024 0000   PLT 205 02/22/2023 1540   MCV 86 02/22/2023 1540   MCH 28.2 02/22/2023 1540   MCH 29.0 06/15/2016 1700   MCHC 32.8 02/22/2023 1540   MCHC 34.6 06/15/2016 1700   RDW 13.3 02/22/2023 1540   Iron Studies No results found for: IRON, TIBC, FERRITIN, IRONPCTSAT Lipid Panel     Component Value Date/Time   CHOL 175 04/30/2024 1409   TRIG 120 04/30/2024 1409   HDL  47 04/30/2024 1409   LDLCALC 106 (H) 04/30/2024 1409   Hepatic Function Panel     Component Value Date/Time   PROT 6.6 04/30/2024 1409   ALBUMIN 4.5 05/14/2024 0000   ALBUMIN 4.3 04/30/2024 1409   AST 20 05/14/2024 0000   ALT 27 05/14/2024 0000   ALKPHOS 58 05/14/2024 0000   BILITOT 0.3 04/30/2024 1409      Component Value Date/Time   TSH 0.91 05/14/2024 0000   TSH 1.020 02/22/2023 1540   Nutritional Lab Results  Component Value Date   VD25OH 37.8 04/30/2024   VD25OH 41.2 10/04/2023   VD25OH 33.6 02/22/2023     Assessment and Plan Assessment & Plan Obesity Management is ongoing with a category four eating plan, followed approximately two-thirds of the time. He engages in walking for 30 minutes, three days a week. Despite these efforts, he has gained six pounds in the last month. Recent stressors, including the passing of his mother, may have impacted his dietary habits. - Continue category four eating plan - Encouraged walking 30 minutes, three days a week - Advised not to skip meals and to include protein intake  Type 2 diabetes mellitus Managed with Mounjaro  10 mg, which he reports is well-tolerated without gastrointestinal issues. He is working on decreasing simple carbohydrates to help control blood sugars. Recent stressors may have affected his dietary habits. - Refilled Mounjaro  10 mg - Encouraged continued reduction of simple carbohydrates  Mixed hyperlipidemia Managed with Crestor  5 mg and lifestyle modifications, including diet and weight loss. He is encouraged to continue these efforts. - Refilled Crestor  5 mg - Encouraged continued dietary modifications and weight loss  Vitamin D  deficiency Managed with ergocalciferol  50,000 IU weekly. - Refilled ergocalciferol  50,000 IU weekly  Depression Managed with Wellbutrin  and Zoloft . He reports feeling unmotivated and experiencing some hopelessness, likely exacerbated by recent bereavement. He feels medicated  enough and does not wish to add more medications at this time. - Continue Wellbutrin  and Zoloft   Insomnia Characterized by difficulty falling and staying asleep, potentially exacerbated by recent stressors and chronic back pain. He has tried over-the-counter sleep aids but experienced grogginess. A combination of melatonin and magnesium is suggested to aid sleep without causing grogginess. - Recommended Calm Sleep Gummy (melatonin and magnesium)      Kenneth Green was informed of the importance of frequent follow up visits to maximize his success with intensive lifestyle modifications for his obesity and obesity related health conditions as recommended by USPSTF and CMS guidelines   [name], MD

## 2024-09-19 ENCOUNTER — Other Ambulatory Visit: Payer: Self-pay | Admitting: Medical Genetics

## 2024-09-19 ENCOUNTER — Ambulatory Visit: Admitting: Psychology

## 2024-09-19 DIAGNOSIS — F4323 Adjustment disorder with mixed anxiety and depressed mood: Secondary | ICD-10-CM | POA: Diagnosis not present

## 2024-09-19 NOTE — Progress Notes (Signed)
 [facility] Counselor/Therapist Progress Note  [name], MRN: [id number],    Date: 09/19/2024  Time Spent: 58 mins  start time: 1400   end time 1458  Treatment Type: Individual Therapy  Reported Symptoms: Pt presented via Caregility video for follow up session, stating his understanding of the limits of virtual visits and granting consent for the session.  Pt stated he was in his home and no one else was present.  I shared with pt that I am in my office and no one else is present.  Mental Status Exam: Appearance:  Casual     Behavior: Appropriate  Motor: Normal  Speech/Language:  Clear and Coherent  Affect: Appropriate  Mood: normal  Thought process: normal  Thought content:   WNL  Sensory/Perceptual disturbances:   WNL  Orientation: oriented to person, place, and time/date  Attention: Good  Concentration: Good  Memory: WNL  Fund of knowledge:  Good  Insight:   Good  Judgment:  Good  Impulse Control: Good   Risk Assessment: Danger to Self:  No Self-injurious Behavior: No Danger to Others: No Duty to Warn:no Physical Aggression / Violence:No  Access to Firearms a concern: No  Gang Involvement:No   Subjective: Pt shares that I think I have been OK since our last session.  My mom's passing (9/28) is still on my mind as has been [name]'s passing as well.  Pt shares he went back to church is past Sunday for the first time in a while.  He is wondering when he might want to get back into playing in the band at church.  Pt shares that they have been doing some events with the [employer] and [employer] and will be doing a couple more events in November; he and [name] are going to the event in [place] in a couple of weeks as well.  Pt shares that [name] is still teaching in the [employer] school system and her position has not been effected by their financial problems.  Pt shares that [name] and her family are doing well.  Encouraged pt to give himself grace as he  still has some guilt feeling related to pulling the plug for [name] and his mom; his family looked to him in both instances for those decisions.  Pt saw his [facility] doctor in the past couple of weeks but he is not doing what he knows to do about his weight loss.  Encouraged pt to think about what he might want to do that would be enjoyable for him; to allow him to begin to get back to enjoying his life again.  Pt shares that his sister Elray) is the executor of their mom's estate and she is likely to draw the process out into a long time.  Pt and his brother do not want anything from their mom's property.  Pt shares that he went to [facility] a couple of weekends ago when he was on his way to [place] for the funeral of his friend who lived there.  Pt shares his alcohol use is steady, even during this time of loss for him.  He shares that he did not drink at all for about 3 weeks after his mom passed away.  Encouraged pt to continue with his self care activities and we will meet in 5 wks for a follow up session.  Interventions: Cognitive Behavioral Therapy  Diagnosis:Adjustment disorder with mixed anxiety and depressed mood  Plan: Treatment Plan Strengths/Abilities:  Intelligent, Intuitive, Willing to  participate in therapy Treatment Preferences:  Outpatient Individual Therapy Statement of Needs:  Patient is to use CBT, mindfulness and coping skills to help manage and/or decrease symptoms associated with their diagnosis. Symptoms:  Depressed/Irritable mood, worry, social withdrawal Problems Addressed:  Depressive thoughts, Sadness, Sleep issues, etc. Long Term Goals:  Pt to reduce overall level, frequency, and intensity of the feelings of depression as evidenced by decreased irritability, negative self talk, and helpless feelings from 6 to 7 days/week to 0 to 1 days/week, per client report, for at least 3 consecutive months.  Progress: 40% Short Term Goals:  Pt to verbally express understanding of the relationship  between feelings of depression and their impact on thinking patterns and behaviors.  Pt to verbalize an understanding of the role that distorted thinking plays in creating fears, excessive worry, and ruminations.  Progress: 40% Target Date:  11/17/2024 Frequency:  Bi-weekly Modality:  Cognitive Behavioral Therapy Interventions by Therapist:  Therapist will use CBT, Mindfulness exercises, Coping skills and Referrals, as needed by client. Client has verbally approved this treatment plan.  Francis KATHEE Macintosh, [facility]

## 2024-10-08 ENCOUNTER — Encounter (INDEPENDENT_AMBULATORY_CARE_PROVIDER_SITE_OTHER): Payer: Self-pay | Admitting: Family Medicine

## 2024-10-08 ENCOUNTER — Other Ambulatory Visit (HOSPITAL_COMMUNITY): Payer: Self-pay

## 2024-10-08 ENCOUNTER — Ambulatory Visit (INDEPENDENT_AMBULATORY_CARE_PROVIDER_SITE_OTHER): Payer: Self-pay | Admitting: Family Medicine

## 2024-10-08 VITALS — BP 125/82 | HR 72 | Temp 97.8°F | Ht 69.0 in | Wt 293.0 lb

## 2024-10-08 DIAGNOSIS — Z6841 Body Mass Index (BMI) 40.0 and over, adult: Secondary | ICD-10-CM

## 2024-10-08 DIAGNOSIS — E782 Mixed hyperlipidemia: Secondary | ICD-10-CM | POA: Diagnosis not present

## 2024-10-08 DIAGNOSIS — E669 Obesity, unspecified: Secondary | ICD-10-CM

## 2024-10-08 DIAGNOSIS — E119 Type 2 diabetes mellitus without complications: Secondary | ICD-10-CM | POA: Diagnosis not present

## 2024-10-08 DIAGNOSIS — M545 Low back pain, unspecified: Secondary | ICD-10-CM | POA: Diagnosis not present

## 2024-10-08 DIAGNOSIS — E559 Vitamin D deficiency, unspecified: Secondary | ICD-10-CM | POA: Diagnosis not present

## 2024-10-08 DIAGNOSIS — G8929 Other chronic pain: Secondary | ICD-10-CM

## 2024-10-08 DIAGNOSIS — E1169 Type 2 diabetes mellitus with other specified complication: Secondary | ICD-10-CM

## 2024-10-08 DIAGNOSIS — Z7985 Long-term (current) use of injectable non-insulin antidiabetic drugs: Secondary | ICD-10-CM

## 2024-10-08 MED ORDER — VITAMIN D (ERGOCALCIFEROL) 1.25 MG (50000 UNIT) PO CAPS
50000.0000 [IU] | ORAL_CAPSULE | ORAL | 0 refills | Status: DC
  Filled 2024-10-08: qty 4, 28d supply, fill #0

## 2024-10-08 MED ORDER — ROSUVASTATIN CALCIUM 5 MG PO TABS
5.0000 mg | ORAL_TABLET | Freq: Every day | ORAL | 0 refills | Status: DC
  Filled 2024-10-08: qty 30, 30d supply, fill #0

## 2024-10-08 MED ORDER — TIRZEPATIDE 10 MG/0.5ML ~~LOC~~ SOAJ
10.0000 mg | SUBCUTANEOUS | 0 refills | Status: DC
  Filled 2024-10-08: qty 2, 28d supply, fill #0

## 2024-10-08 NOTE — Progress Notes (Signed)
 Office: [phone]  /  Fax: [phone]  WEIGHT SUMMARY AND BIOMETRICS  Anthropometric Measurements Height: 5' 9 (1.753 m) Weight: 293 lb (132.9 kg) BMI (Calculated): 43.25 Weight at Last Visit: 298 lb Weight Lost Since Last Visit: 5 lb Weight Gained Since Last Visit: 0 Starting Weight: 308 lb Total Weight Loss (lbs): 15 lb (6.804 kg) Peak Weight: 308 lb   Body Composition  Body Fat %: 38.9 % Fat Mass (lbs): 114 lbs Muscle Mass (lbs): 170.2 lbs Total Body Water (lbs): 126.2 lbs Visceral Fat Rating : 26   Other Clinical Data Fasting: yes Labs: no Today's Visit #: 20 Starting Date: 04/10/19    Chief Complaint: OBESITY    History of Present Illness Kenneth Green is a 58 year old male who presents for obesity treatment and progress assessment.  He is following a category four eating plan approximately seventy percent of the time, consuming the recommended amount of protein and not skipping meals. However, he struggles with consuming enough fruits and vegetables and maintaining adequate hydration. He has lost five pounds in the last month.  He generally sleeps seven to nine hours per night. For physical activity, he walks for forty-five minutes, four days a week. He is also active with the [employer] on weekends.  He is being treated for vitamin D  deficiency with prescription ergocalciferol  at a dose of fifty thousand international units per week and requests a refill. He is managing hyperlipidemia with diet, exercise, weight loss, and Crestor , for which he also requests a refill. His type two diabetes is being managed with Mounjaro  ten milligrams weekly, and he reports doing well overall, requesting a refill.  He experiences back pain, which he describes as worsening. He has been trying exercises to strengthen his core. Standing exacerbates the pain, while walking provides some relief. He cannot lay flat due to severe pain and uses an adjustable bed  to manage this. His legs sometimes feel asleep. 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  Socially, he is active with the [employer] and [employer] and enjoys cooking, particularly soups and stews. He plans to spend Thanksgiving with family at his mother's house, which is nearby.      PHYSICAL EXAM:  Blood pressure 125/82, pulse 72, temperature 97.8 F (36.6 C), height 5' 9 (1.753 m), weight 293 lb (132.9 kg), SpO2 98%. Body mass index is 43.27 kg/m.  DIAGNOSTIC DATA REVIEWED:  BMET    Component Value Date/Time   NA 139 05/14/2024 0000   K 3.9 05/14/2024 0000   CL 104 05/14/2024 0000   CO2 26 (A) 05/14/2024 0000   GLUCOSE 87 04/30/2024 1409   GLUCOSE 106 (H) 06/15/2016 1700   BUN 12 05/14/2024 0000   CREATININE 1.2 05/14/2024 0000   CREATININE 1.07 04/30/2024 1409   CALCIUM  10.3 05/14/2024 0000   GFRNONAA 74 05/11/2021 0000   GFRAA 91 01/15/2021 1322   Lab Results  Component Value Date   HGBA1C 5.5 04/30/2024   HGBA1C 5.9 (H) 04/10/2019   Lab Results  Component Value Date   INSULIN  37.1 (H) 04/30/2024   INSULIN  30.9 (H) 04/10/2019   Lab Results  Component Value Date   TSH 0.91 05/14/2024   CBC    Component Value Date/Time   WBC 7.1 05/14/2024 0000   WBC 7.7 06/15/2016 1700   RBC 4.92 05/14/2024 0000   HGB 13.9 05/14/2024 0000   HGB 14.2 02/22/2023 1540  HCT 42 05/14/2024 0000   HCT 43.3 02/22/2023 1540   PLT 254 05/14/2024 0000   PLT 205 02/22/2023 1540   MCV 86 02/22/2023 1540   MCH 28.2 02/22/2023 1540   MCH 29.0 06/15/2016 1700   MCHC 32.8 02/22/2023 1540   MCHC 34.6 06/15/2016 1700   RDW 13.3 02/22/2023 1540   Iron Studies No results found for: IRON, TIBC, FERRITIN, IRONPCTSAT Lipid Panel     Component Value Date/Time   CHOL 175 04/30/2024 1409   TRIG 120 04/30/2024 1409   HDL 47 04/30/2024 1409   LDLCALC 106 (H) 04/30/2024 1409   Hepatic Function Panel     Component Value Date/Time    PROT 6.6 04/30/2024 1409   ALBUMIN 4.5 05/14/2024 0000   ALBUMIN 4.3 04/30/2024 1409   AST 20 05/14/2024 0000   ALT 27 05/14/2024 0000   ALKPHOS 58 05/14/2024 0000   BILITOT 0.3 04/30/2024 1409      Component Value Date/Time   TSH 0.91 05/14/2024 0000   TSH 1.020 02/22/2023 1540   Nutritional Lab Results  Component Value Date   VD25OH 37.8 04/30/2024   VD25OH 41.2 10/04/2023   VD25OH 33.6 02/22/2023     Assessment and Plan Assessment & Plan Obesity management Obesity is managed with a category four eating plan, followed 70% of the time. He is consuming recommended protein amounts, not skipping meals, and walking 45 minutes, four days a week. He has lost five pounds in the last month. Challenges include inadequate fruit and vegetable intake and hydration. - Continue category four eating plan - Encouraged increased intake of fruits and vegetables - Encouraged adequate hydration - Continue walking 45 minutes, four days a week  Type 2 diabetes mellitus Type 2 diabetes is managed with Mounjaro  10 mg weekly. He is doing well overall and requests a refill. - Continue Mounjaro  10 mg weekly - Refilled Mounjaro  prescription - Continue diet, exercise and weight loss as discussed today as an important part of the treatment plan   Mixed hyperlipidemia Managed with diet, exercise, weight loss, and Crestor . He requests a refill. - Continue Crestor  - Refilled Crestor  prescription - Continue diet, exercise and weight loss as discussed today as an important part of the treatment plan   Vitamin D  deficiency Managed with prescription ergocalciferol  50,000 IU weekly. He requests a refill. - Continue ergocalciferol  50,000 IU weekly - Refilled ergocalciferol  prescription  Low back pain with possible spinal stenosis and/or bulging disc Low back pain is worsening, with difficulty standing and inability to lay flat. Pain is searing, and he experiences leg numbness. Walking alleviates pain.  Previous MRI indicated spinal issues. He is considering follow-up with Dr. Gust for further evaluation and possible MRI. - Follow up with Dr. Gust for evaluation and possible MRI - Consider scheduling MRI before the end of the year to utilize current deductible   [name] was counseled on the importance of maintaining healthy lifestyle habits, including balanced nutrition, regular physical activity, and behavioral modifications, while taking antiobesity medication.  Patient verbalized understanding that medication is an adjunct to, not a replacement for, lifestyle changes and that the long-term success and weight maintenance depend on continued adherence to these strategies.   Kolbey was informed of the importance of frequent follow up visits to maximize his success with intensive lifestyle modifications for his obesity and obesity related health conditions as recommended by USPSTF and CMS guidelines   [name], MD

## 2024-10-09 ENCOUNTER — Other Ambulatory Visit: Payer: Self-pay | Admitting: Behavioral Health

## 2024-10-09 DIAGNOSIS — F331 Major depressive disorder, recurrent, moderate: Secondary | ICD-10-CM

## 2024-10-09 DIAGNOSIS — F411 Generalized anxiety disorder: Secondary | ICD-10-CM

## 2024-10-10 LAB — CMP14+EGFR
ALT: 38 IU/L (ref 0–44)
AST: 30 IU/L (ref 0–40)
Albumin: 4.5 g/dL (ref 3.8–4.9)
Alkaline Phosphatase: 57 IU/L (ref 47–123)
BUN/Creatinine Ratio: 15 (ref 10–24)
BUN: 15 mg/dL (ref 8–27)
Bilirubin Total: 0.4 mg/dL (ref 0.0–1.2)
CO2: 23 mmol/L (ref 20–29)
Calcium: 10 mg/dL (ref 8.6–10.2)
Chloride: 102 mmol/L (ref 96–106)
Creatinine, Ser: 0.97 mg/dL (ref 0.76–1.27)
Globulin, Total: 2.4 g/dL (ref 1.5–4.5)
Glucose: 89 mg/dL (ref 70–99)
Potassium: 4.3 mmol/L (ref 3.5–5.2)
Sodium: 141 mmol/L (ref 134–144)
Total Protein: 6.9 g/dL (ref 6.0–8.5)
eGFR: 89 mL/min/1.73 (ref >59)

## 2024-10-10 LAB — LIPID PANEL WITH LDL/HDL RATIO
Cholesterol, Total: 125 mg/dL (ref 100–199)
HDL: 48 mg/dL (ref >39)
LDL Chol Calc (NIH): 61 mg/dL (ref 0–99)
LDL/HDL Ratio: 1.3 ratio (ref 0.0–3.6)
Triglycerides: 81 mg/dL (ref 0–149)
VLDL Cholesterol Cal: 16 mg/dL (ref 5–40)

## 2024-10-10 LAB — CBC WITH DIFFERENTIAL/PLATELET
Basophils Absolute: 0 x10E3/uL (ref 0.0–0.2)
Basos: 1 %
EOS (ABSOLUTE): 0.3 x10E3/uL (ref 0.0–0.4)
Eos: 5 %
Hematocrit: 42.3 % (ref 37.5–51.0)
Hemoglobin: 13.8 g/dL (ref 13.0–17.7)
Immature Grans (Abs): 0 x10E3/uL (ref 0.0–0.1)
Immature Granulocytes: 0 %
Lymphocytes Absolute: 1.3 x10E3/uL (ref 0.7–3.1)
Lymphs: 20 %
MCH: 28.5 pg (ref 26.6–33.0)
MCHC: 32.6 g/dL (ref 31.5–35.7)
MCV: 87 fL (ref 79–97)
Monocytes Absolute: 0.5 x10E3/uL (ref 0.1–0.9)
Monocytes: 8 %
Neutrophils Absolute: 4.2 x10E3/uL (ref 1.4–7.0)
Neutrophils: 66 %
Platelets: 241 x10E3/uL (ref 150–450)
RBC: 4.85 x10E6/uL (ref 4.14–5.80)
RDW: 13.1 % (ref 11.6–15.4)
WBC: 6.3 x10E3/uL (ref 3.4–10.8)

## 2024-10-10 LAB — VITAMIN B12: Vitamin B-12: 398 pg/mL (ref 232–1245)

## 2024-10-10 LAB — VITAMIN D 25 HYDROXY (VIT D DEFICIENCY, FRACTURES): Vit D, 25-Hydroxy: 46.6 ng/mL (ref 30.0–100.0)

## 2024-10-10 LAB — HEMOGLOBIN A1C
Est. average glucose Bld gHb Est-mCnc: 114 mg/dL
Hgb A1c MFr Bld: 5.6 % (ref 4.8–5.6)

## 2024-10-10 LAB — INSULIN, RANDOM: INSULIN: 28.2 u[IU]/mL — AB (ref 2.6–24.9)

## 2024-10-24 ENCOUNTER — Ambulatory Visit: Admitting: Psychology

## 2024-10-24 DIAGNOSIS — F4323 Adjustment disorder with mixed anxiety and depressed mood: Secondary | ICD-10-CM

## 2024-10-24 NOTE — Progress Notes (Signed)
 [facility] Counselor/Therapist Progress Note  [name], MRN: [id number],    Date: 10/24/2024  Time Spent: 60 mins  start time: 1400   end time 1500  Treatment Type: Individual Therapy  Reported Symptoms: Pt presented via Caregility video for follow up session, stating his understanding of the limits of virtual visits and granting consent for the session.  Pt stated he was in his home and no one else was present.  I shared with pt that I am in my office and no one else is present.  Mental Status Exam: Appearance:  Casual     Behavior: Appropriate  Motor: Normal  Speech/Language:  Clear and Coherent  Affect: Appropriate  Mood: normal  Thought process: normal  Thought content:   WNL  Sensory/Perceptual disturbances:   WNL  Orientation: oriented to person, place, and time/date  Attention: Good  Concentration: Good  Memory: WNL  Fund of knowledge:  Good  Insight:   Good  Judgment:  Good  Impulse Control: Good   Risk Assessment: Danger to Self:  No Self-injurious Behavior: No Danger to Others: No Duty to Warn:no Physical Aggression / Violence:No  Access to Firearms a concern: No  Gang Involvement:No   Notified of retirement  Next session: list of things he is good at; talked to the journalist, newspaper at church, not beating himself up.  Subjective: Pt shares that I think I have not been OK since our last session.  I have a list of things I want to run through and then we can talk about whatever you want to talk about.  Pt shares a list of things that he has been thinking about in recent weeks: he shares that he feels like he is avoiding things, he is running away from things, he feels like he is taking the wide road, isolating from people, work sucks, I have no accountability; Clinical Cytogeneticist and my mom were my accountability people, Realizations I am a failure, I am fat, old, and my memory is failing.  I am falling apart physically.  I also  have blessings in my life: won a new gun, was given a new gun by a friend, grand kids, church folks reaching out to me, Scientist, [employer] and [employer], catering manager.  Pt reiterates that he likes his job; he did not get the day time job he had applied for; they gave it to a much younger guy with no experience.  Pt shares that he is aware of numerous blessings in his life (grand kids, won a new gun, was given another new gun, his church family reaching out to him regularly, the [employer] and [employer]).  I am smoking a pipe, I am drinking more than I want to but I do not get drunk, and I am gambling now (low level slots).  He shared that he went to [place] to the casino three nights over Thanksgiving and he lost more than what he had planned.  He has given his location to both girls and he has told them about his gambling.  Pt has an appt with [facility] (Dr. Verdon) next week; he had labs drawn last week and most of the results were good.  Asked pt to work on developing a list of things he believes he does well; asked him to talk with the orchestra leader at church and asked him to be as intentional as he can be about interrupting his negative self talk.  Encouraged pt to continue with his self care  activities and we will meet in 2 wks for a follow up session.  Interventions: Cognitive Behavioral Therapy  Diagnosis:Adjustment disorder with mixed anxiety and depressed mood  Plan: Treatment Plan Strengths/Abilities:  Intelligent, Intuitive, Willing to participate in therapy Treatment Preferences:  Outpatient Individual Therapy Statement of Needs:  Patient is to use CBT, mindfulness and coping skills to help manage and/or decrease symptoms associated with their diagnosis. Symptoms:  Depressed/Irritable mood, worry, social withdrawal Problems Addressed:  Depressive thoughts, Sadness, Sleep issues, etc. Long Term Goals:  Pt to reduce overall level, frequency, and intensity of the feelings of depression as evidenced by decreased  irritability, negative self talk, and helpless feelings from 6 to 7 days/week to 0 to 1 days/week, per client report, for at least 3 consecutive months.  Progress: 40% Short Term Goals:  Pt to verbally express understanding of the relationship between feelings of depression and their impact on thinking patterns and behaviors.  Pt to verbalize an understanding of the role that distorted thinking plays in creating fears, excessive worry, and ruminations.  Progress: 40% Target Date:  11/17/2025 Frequency:  Bi-weekly Modality:  Cognitive Behavioral Therapy Interventions by Therapist:  Therapist will use CBT, Mindfulness exercises, Coping skills and Referrals, as needed by client. Client has verbally approved this treatment plan.  Francis KATHEE Macintosh, [facility]

## 2024-10-31 ENCOUNTER — Ambulatory Visit (INDEPENDENT_AMBULATORY_CARE_PROVIDER_SITE_OTHER): Payer: Self-pay | Admitting: Family Medicine

## 2024-10-31 ENCOUNTER — Ambulatory Visit: Admitting: Behavioral Health

## 2024-10-31 ENCOUNTER — Encounter: Payer: Self-pay | Admitting: Behavioral Health

## 2024-10-31 ENCOUNTER — Other Ambulatory Visit: Payer: Self-pay

## 2024-10-31 ENCOUNTER — Encounter (INDEPENDENT_AMBULATORY_CARE_PROVIDER_SITE_OTHER): Payer: Self-pay | Admitting: Family Medicine

## 2024-10-31 ENCOUNTER — Other Ambulatory Visit (HOSPITAL_COMMUNITY): Payer: Self-pay

## 2024-10-31 VITALS — BP 145/87 | HR 66 | Temp 98.0°F | Ht 69.0 in | Wt 293.0 lb

## 2024-10-31 DIAGNOSIS — F411 Generalized anxiety disorder: Secondary | ICD-10-CM | POA: Diagnosis not present

## 2024-10-31 DIAGNOSIS — Z7985 Long-term (current) use of injectable non-insulin antidiabetic drugs: Secondary | ICD-10-CM | POA: Diagnosis not present

## 2024-10-31 DIAGNOSIS — E119 Type 2 diabetes mellitus without complications: Secondary | ICD-10-CM

## 2024-10-31 DIAGNOSIS — Z6841 Body Mass Index (BMI) 40.0 and over, adult: Secondary | ICD-10-CM | POA: Diagnosis not present

## 2024-10-31 DIAGNOSIS — E559 Vitamin D deficiency, unspecified: Secondary | ICD-10-CM | POA: Diagnosis not present

## 2024-10-31 DIAGNOSIS — E538 Deficiency of other specified B group vitamins: Secondary | ICD-10-CM | POA: Diagnosis not present

## 2024-10-31 DIAGNOSIS — E782 Mixed hyperlipidemia: Secondary | ICD-10-CM | POA: Diagnosis not present

## 2024-10-31 DIAGNOSIS — I10 Essential (primary) hypertension: Secondary | ICD-10-CM | POA: Diagnosis not present

## 2024-10-31 DIAGNOSIS — F331 Major depressive disorder, recurrent, moderate: Secondary | ICD-10-CM

## 2024-10-31 MED ORDER — ROSUVASTATIN CALCIUM 5 MG PO TABS
5.0000 mg | ORAL_TABLET | Freq: Every day | ORAL | 0 refills | Status: DC
  Filled 2024-10-31: qty 30, 30d supply, fill #0

## 2024-10-31 MED ORDER — VITAMIN B-12 1000 MCG PO TABS
1000.0000 ug | ORAL_TABLET | Freq: Every day | ORAL | 0 refills | Status: DC
  Filled 2024-10-31: qty 90, 90d supply, fill #0

## 2024-10-31 MED ORDER — TIRZEPATIDE 10 MG/0.5ML ~~LOC~~ SOAJ
10.0000 mg | SUBCUTANEOUS | 0 refills | Status: DC
  Filled 2024-10-31: qty 2, 28d supply, fill #0

## 2024-10-31 MED ORDER — VITAMIN D (ERGOCALCIFEROL) 1.25 MG (50000 UNIT) PO CAPS
50000.0000 [IU] | ORAL_CAPSULE | ORAL | 0 refills | Status: DC
  Filled 2024-10-31 (×2): qty 4, 28d supply, fill #0

## 2024-10-31 NOTE — Progress Notes (Signed)
 Office: [phone]  /  Fax: [phone]  WEIGHT SUMMARY AND BIOMETRICS  Anthropometric Measurements Height: 5' 9 (1.753 m) Weight: 293 lb (132.9 kg) BMI (Calculated): 43.25 Weight at Last Visit: 298 lb Weight Lost Since Last Visit: 0 Weight Gained Since Last Visit: 0 Starting Weight: 308 lb Total Weight Loss (lbs): 15 lb (6.804 kg) Peak Weight: 308 lb   Body Composition  Body Fat %: 39.3 % Fat Mass (lbs): 115.2 lbs Muscle Mass (lbs): 169.2 lbs Total Body Water (lbs): 125.6 lbs Visceral Fat Rating : 26   Other Clinical Data Fasting: yes Labs: no Today's Visit #: 21 Starting Date: 04/10/19    Chief Complaint: OBESITY    History of Present Illness Kenneth Green is a 58 year old male with obesity, hypertension, type 2 diabetes, and hyperlipidemia who presents for obesity treatment and progress assessment.  He is following the Category 4 eating plan approximately seventy percent of the time and engages in walking for exercise five days a week, each session lasting forty-five minutes. He successfully maintained his weight over Thanksgiving by adhering to his plan before and after the holiday.  He is currently taking amlodipine 5 mg, benazepril  20 mg, and hydrochlorothiazide  25 mg for hypertension. At home, his blood pressure readings are generally well-controlled, with the diastolic pressure below 90.  He is being treated for type 2 diabetes with Mounjaro  10 mg weekly. His A1c is 5.6, and his glucose level is 89. His insulin  level has decreased from 37 to 28.  For hyperlipidemia, he is on Crestor  5 mg daily. His triglycerides are 81 and LDL is 61.  He has a vitamin D  deficiency and is treated with prescription ergocalciferol  50,000 IU weekly. His vitamin D  level is 46. Additionally, his B12 levels are low.      PHYSICAL EXAM:  Blood pressure (!) 145/87, pulse 66, temperature 98 F (36.7 C), height 5' 9 (1.753 m), weight 293 lb (132.9 kg), SpO2  98%. Body mass index is 43.27 kg/m.  DIAGNOSTIC DATA REVIEWED:  BMET    Component Value Date/Time   NA 141 10/08/2024 1424   K 4.3 10/08/2024 1424   CL 102 10/08/2024 1424   CO2 23 10/08/2024 1424   GLUCOSE 89 10/08/2024 1424   GLUCOSE 106 (H) 06/15/2016 1700   BUN 15 10/08/2024 1424   CREATININE 0.97 10/08/2024 1424   CALCIUM  10.0 10/08/2024 1424   GFRNONAA 74 05/11/2021 0000   GFRAA 91 01/15/2021 1322   Lab Results  Component Value Date   HGBA1C 5.6 10/08/2024   HGBA1C 5.9 (H) 04/10/2019   Lab Results  Component Value Date   INSULIN  28.2 (H) 10/08/2024   INSULIN  30.9 (H) 04/10/2019   Lab Results  Component Value Date   TSH 0.91 05/14/2024   CBC    Component Value Date/Time   WBC 6.3 10/08/2024 1424   WBC 7.7 06/15/2016 1700   RBC 4.85 10/08/2024 1424   RBC 4.92 05/14/2024 0000   HGB 13.8 10/08/2024 1424   HCT 42.3 10/08/2024 1424   PLT 241 10/08/2024 1424   MCV 87 10/08/2024 1424   MCH 28.5 10/08/2024 1424   MCH 29.0 06/15/2016 1700   MCHC 32.6 10/08/2024 1424   MCHC 34.6 06/15/2016 1700   RDW 13.1 10/08/2024 1424   Iron Studies No results found for: IRON, TIBC, FERRITIN, IRONPCTSAT Lipid Panel     Component Value Date/Time   CHOL 125 10/08/2024 1424   TRIG 81 10/08/2024 1424   HDL 48  10/08/2024 1424   LDLCALC 61 10/08/2024 1424   Hepatic Function Panel     Component Value Date/Time   PROT 6.9 10/08/2024 1424   ALBUMIN 4.5 10/08/2024 1424   AST 30 10/08/2024 1424   ALT 38 10/08/2024 1424   ALKPHOS 57 10/08/2024 1424   BILITOT 0.4 10/08/2024 1424      Component Value Date/Time   TSH 0.91 05/14/2024 0000   TSH 1.020 02/22/2023 1540   Nutritional Lab Results  Component Value Date   VD25OH 46.6 10/08/2024   VD25OH 37.8 04/30/2024   VD25OH 41.2 10/04/2023     Assessment and Plan Assessment & Plan Morbid obesity Following the Category 4 eating plan 70% of the time and walking 45 minutes, five days a week. Maintained weight  over Thanksgiving. Discussed strategies for maintaining weight, including meal planning and protein intake. Discussed potential increase in Mounjaro  dose, but decided to maintain current dose due to potential side effects such as queasiness and constipation, and the risk of not meeting protein needs. - Continue Category 4 eating plan - Continue walking 45 minutes, five days a week - Maintain current Mounjaro  dose - Will reassess Mounjaro  dose in January  Type 2 diabetes mellitus Well-controlled with an A1c of 5.6 and fasting glucose of 89. Insulin  levels decreased from 37 to 28, but still above target. Discussed insulin  resistance and dietary modifications to reduce insulin  response. - Continue current diabetes management plan - Focus on reducing carbohydrate intake - Continue regular exercise  Mixed hyperlipidemia Managed with Crestor  5 mg daily. Cholesterol levels improved significantly with LDL at 61, triglycerides at 81, and HDL at 48. Discussed dietary modifications and exercise to further improve lipid profile. - Continue Crestor  5 mg daily - Continue dietary modifications to reduce cholesterol - Continue regular exercise  Hypertension Blood pressure elevated at 142/85 and 145/80. Currently on amlodipine, benazepril , and hydrochlorothiazide . Discussed potential impact of sodium intake and timing of medication on blood pressure readings. - Continue current antihypertensive regimen - Monitor blood pressure at home - Avoid high sodium foods  Vitamin D  deficiency Vitamin D  levels improved to 46, close to target range of 50-60. Continued supplementation is necessary, especially during winter months. - Continue ergocalciferol  50,000 IU weekly  Vitamin B12 deficiency B12 levels have not reached target above 500. Discussed potential absorption issues and the need for supplementation. - Prescribed B12 1000 mcg daily - Instructed to check with pharmacy for cost-effective option between  prescription and over-the-counter B12      Patients who are on anti-obesity medications are counseled on the importance of maintaining healthy lifestyle habits, including balanced nutrition, regular physical activity, and behavioral modifications,  Medication is an adjunct to, not a replacement for, lifestyle changes and that the long-term success and weight maintenance depend on continued adherence to these strategies.   Greig was informed of the importance of frequent follow up visits to maximize his success with intensive lifestyle modifications for his obesity and obesity related health conditions as recommended by USPSTF and CMS guidelines [name], MD

## 2024-10-31 NOTE — Progress Notes (Signed)
 [facility]  [name],  MRN: [id number]  PCP: Aisha Harvey, MD  Date of Evaluation: 10/31/2024 Time spent:20 minutes  Chief Complaint:  Chief Complaint   Depression; Anxiety; Follow-up; Medication Refill; Patient Education; Stress     HISTORY/CURRENT STATUS: HPI  58 year old male presents to this office for follow up and medication management. He is calm but subdued today.  His wife had passed in 04-01-25and now reporting that his mother passed in September 2025.  Says the holidays are tough but that he is doing okay.  He is hoping that 2026 will be a much better year.  Says that he is staying busy and coping the best he can. He continues in therapy.  Does not want to make any medication changes or adjustments today.  Needing refills he reports his anxiety today at 3/10  and depression at 4/10. Says he is sleeping 7-8 hours nightly. Denies mania, no psychosis. No SI/HI.    No Past medication failures        Individual Medical History/ Review of Systems: Changes? :No   Allergies: Iodinated contrast media, Iohexol, Promethazine-dm, and Shellfish allergy  Current Medications:  Current Outpatient Medications:    amLODipine (NORVASC) 5 MG tablet, Take 5 mg by mouth daily., Disp: , Rfl:    benazepril -hydrochlorthiazide (LOTENSIN  HCT) 20-25 MG tablet, Take 1 tablet by mouth daily., Disp: , Rfl:    buPROPion  (WELLBUTRIN  SR) 200 MG 12 hr tablet, Take 1 tablet (200 mg total) by mouth 2 (two) times daily., Disp: 180 tablet, Rfl: 1   cyanocobalamin  (VITAMIN B12) 1000 MCG tablet, Take 1 tablet (1,000 mcg total) by mouth daily., Disp: 90 tablet, Rfl: 0   fexofenadine (ALLEGRA) 180 MG tablet, Take 180 mg by mouth daily., Disp: , Rfl:    latanoprost (XALATAN) 0.005 % ophthalmic solution, SMARTSIG:In Eye(s), Disp: , Rfl:    pregabalin (LYRICA) 150 MG capsule, Take 150 mg by mouth 2 (two) times daily., Disp: , Rfl:    rosuvastatin  (CRESTOR ) 5 MG tablet,  Take 1 tablet (5 mg total) by mouth daily., Disp: 30 tablet, Rfl: 0   sertraline  (ZOLOFT ) 50 MG tablet, TAKE 1 TABLET BY MOUTH DAILY, Disp: 90 tablet, Rfl: 1   tirzepatide  (MOUNJARO ) 10 MG/0.5ML Pen, Inject 10 mg into the skin once a week., Disp: 2 mL, Rfl: 0   Vitamin D , Ergocalciferol , (DRISDOL ) 1.25 MG (50000 UNIT) CAPS capsule, Take 1 capsule (50,000 Units total) by mouth every 7 (seven) days., Disp: 4 capsule, Rfl: 0 Medication Side Effects: none  Family Medical/ Social History: Changes? No  MENTAL HEALTH EXAM:  There were no vitals taken for this visit.There is no height or weight on file to calculate BMI.  General Appearance: Casual, Neat, and Well Groomed  Eye Contact:  Good  Speech:  Clear and Coherent  Volume:  Normal  Mood:  NA  Affect:  Appropriate  Thought Process:  Coherent  Orientation:  Full (Time, Place, and Person)  Thought Content: Logical   Suicidal Thoughts:  No  Homicidal Thoughts:  No  Memory:  WNL  Judgement:  Good  Insight:  Good  Psychomotor Activity:  Normal  Concentration:  Concentration: Good  Recall:  Good  Fund of Knowledge: Good  Language: Good  Assets:  Desire for Improvement  ADL's:  Intact  Cognition: WNL  Prognosis:  Good    DIAGNOSES:    ICD-10-CM   1. Major depressive disorder, recurrent episode, moderate (HCC)  F33.1  2. Generalized anxiety disorder  F41.1       Receiving Psychotherapy: No    RECOMMENDATIONS:   Greater than 50% of 30 min face to face time with patient was spent on counseling and coordination of care.  Happy with his medication right now.  We talked about his very traumatic year with his wife and mother's passing.  Still adjusting to changes and living alone. Continues in therapy. Requesting no changes to his medications right now.     We discussed the following plan: To continue Zoloft  to 50 mg daily Continue Wellbutrin  200 mg SR two times daily To report side effects or worsening symptoms  promptly Provided emergency contact information To follow up in 6 months to reassess. Reviewed PDMP     Redell DELENA Pizza, NP

## 2024-11-07 ENCOUNTER — Ambulatory Visit: Admitting: Psychology

## 2024-11-07 DIAGNOSIS — F4323 Adjustment disorder with mixed anxiety and depressed mood: Secondary | ICD-10-CM | POA: Diagnosis not present

## 2024-11-07 NOTE — Progress Notes (Signed)
 [facility] Counselor/Therapist Progress Note  [name], MRN: [id number],    Date: 11/07/2024  Time Spent: 60 mins  start time: 1300   end time 1400  Treatment Type: Individual Therapy  Reported Symptoms: Pt presented via Caregility video for follow up session, stating his understanding of the limits of virtual visits and granting consent for the session.  Pt stated he was in his home and no one else was present.  I shared with pt that I am in my office and no one else is present.  Mental Status Exam: Appearance:  Casual     Behavior: Appropriate  Motor: Normal  Speech/Language:  Clear and Coherent  Affect: Appropriate  Mood: normal  Thought process: normal  Thought content:   WNL  Sensory/Perceptual disturbances:   WNL  Orientation: oriented to person, place, and time/date  Attention: Good  Concentration: Good  Memory: WNL  Fund of knowledge:  Good  Insight:   Good  Judgment:  Good  Impulse Control: Good   Risk Assessment: Danger to Self:  No Self-injurious Behavior: No Danger to Others: No Duty to Warn:no Physical Aggression / Violence:No  Access to Firearms a concern: No  Gang Involvement:No   Notified of retirement  Next session: list of things he is good at; talk to the journalist, newspaper at church, not beating himself up.  Subjective: Pt shares that I feel pretty good; I bought a grandpa mobile.  Pt shares that he has purchased a 2018 [employer] [place] XL to take the kids around.  Pt shares that he is not looking forward to this Christmas because of the loss of [name] and pt's mom.  Talked with pt about celebrating both of them with family and the girls during their separate celebrations of the season; pt shares again about his spiritual perspective and that shared by his family.  He will try to focus on what he had with his mom and with [place] during this holiday season.  Pt is trying to get through the holidays the best he can.  Pt shares  he saw Dr. Verdon at [facility] and that was OK for him.  Pt shares he continues to be hard on himself; I have lost my systems of checks and balances with the loss of [name] and mom.  I am continuing to gamble and I am not sure why I continue to put myself through that.  I strategize on the way up there as to how to be successful and on the way home I am beating myself up for losing money.  Pt shares that he continues to smoke his pipe and continues to drink reasonably.  He has given his location to both girls and he has told them about his gambling.  Asked pt to work on developing a list of things he believes he does well; asked him to talk with the orchestra leader at church and asked him to be as intentional as he can be about interrupting his negative self talk.  Encouraged pt to continue with his self care activities and we will meet in 2 wks for a follow up session.  Interventions: Cognitive Behavioral Therapy  Diagnosis:Adjustment disorder with mixed anxiety and depressed mood  Plan: Treatment Plan Strengths/Abilities:  Intelligent, Intuitive, Willing to participate in therapy Treatment Preferences:  Outpatient Individual Therapy Statement of Needs:  Patient is to use CBT, mindfulness and coping skills to help manage and/or decrease symptoms associated with their diagnosis. Symptoms:  Depressed/Irritable mood, worry,  social withdrawal Problems Addressed:  Depressive thoughts, Sadness, Sleep issues, etc. Long Term Goals:  Pt to reduce overall level, frequency, and intensity of the feelings of depression as evidenced by decreased irritability, negative self talk, and helpless feelings from 6 to 7 days/week to 0 to 1 days/week, per client report, for at least 3 consecutive months.  Progress: 40% Short Term Goals:  Pt to verbally express understanding of the relationship between feelings of depression and their impact on thinking patterns and behaviors.  Pt to verbalize an understanding of the role that  distorted thinking plays in creating fears, excessive worry, and ruminations.  Progress: 40% Target Date:  11/17/2025 Frequency:  Bi-weekly Modality:  Cognitive Behavioral Therapy Interventions by Therapist:  Therapist will use CBT, Mindfulness exercises, Coping skills and Referrals, as needed by client. Client has verbally approved this treatment plan.  Francis KATHEE Macintosh, [facility]

## 2024-11-16 ENCOUNTER — Other Ambulatory Visit (HOSPITAL_COMMUNITY)
Admission: RE | Admit: 2024-11-16 | Discharge: 2024-11-16 | Disposition: A | Discharge status: Home / Self Care | Payer: Self-pay | Source: Ambulatory Visit | Attending: Medical Genetics | Admitting: Medical Genetics

## 2024-11-28 ENCOUNTER — Other Ambulatory Visit (INDEPENDENT_AMBULATORY_CARE_PROVIDER_SITE_OTHER): Payer: Self-pay | Admitting: Family Medicine

## 2024-11-28 ENCOUNTER — Other Ambulatory Visit (HOSPITAL_COMMUNITY): Payer: Self-pay

## 2024-11-29 ENCOUNTER — Ambulatory Visit: Admitting: Psychology

## 2024-11-29 DIAGNOSIS — F4323 Adjustment disorder with mixed anxiety and depressed mood: Secondary | ICD-10-CM | POA: Diagnosis not present

## 2024-11-29 NOTE — Progress Notes (Signed)
 "  [facility] Counselor/Therapist Progress Note  Patient ID: AKEEM HEPPLER, MRN: [id number],    Date: 11/29/2024  Time Spent: 60 mins  start time: 1300   end time 1400  Treatment Type: Individual Therapy  Reported Symptoms: Pt presented via Caregility video for follow up session, stating his understanding of the limits of virtual visits and granting consent for the session.  Pt stated he was in his home and no one else was present.  I shared with pt that I am in my office and no one else is present.  Mental Status Exam: Appearance:  Casual     Behavior: Appropriate  Motor: Normal  Speech/Language:  Clear and Coherent  Affect: Appropriate  Mood: normal  Thought process: normal  Thought content:   WNL  Sensory/Perceptual disturbances:   WNL  Orientation: oriented to person, place, and time/date  Attention: Good  Concentration: Good  Memory: WNL  Fund of knowledge:  Good  Insight:   Good  Judgment:  Good  Impulse Control: Good   Risk Assessment: Danger to Self:  No Self-injurious Behavior: No Danger to Others: No Duty to Warn:no Physical Aggression / Violence:No  Access to Firearms a concern: No  Gang Involvement:No   Notified of retirement  Next session: list of things he is good at; talk to the journalist, newspaper at church, not beating himself up.  Subjective: Pt shares that I am doing OK; today would have been our 26th wedding anniversary.  Pt shares some history of his relationship with his ex-wife and how he and [name] met.  Pt shares he is doing OK; his son-in-law took him deer hunting the Saturday after Christmas and he got a 6 pt buck.  Pt continues to enjoy his 2018 [employer] [place] XL.  Pt took several days off from work over the holidays.  He will be working M-F from 7pm to 4 am.  He is not sure how that will work but is OK with that change.  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  Pt shares that [name] will be moving back in with him in the next couple of months in an effort to save money towards a home of her own.  She has been renting a home next to pt from pt's aunt.  Pt shares that [name] is continuing to enjoy her work as a science writer.  Pt continues to keep [place] on Mondays to spend time with him.  Pt shares he is feeling a bit better about his health; he is not drinking, smoking, or gambling so far this year.  He is trying to eat better and trying to eat a home more.  He has noticed that he has lost a few pounds since our last session and he feels good about that.  Pt is not gambling this year so far either; if he chooses to go again, he has some guard rails in place for him.  Pt shares that he did talk with the orchestra leader at church; he is going to start back with them soon and he likes that decision for himself.  Encouraged pt to continue with his self care activities and we will meet in 3 wks for a follow up session.  Interventions: Cognitive Behavioral Therapy  Diagnosis:Adjustment disorder with mixed anxiety and depressed mood  Plan: Treatment Plan Strengths/Abilities:  Intelligent, Intuitive, Willing  to participate in therapy Treatment Preferences:  Outpatient Individual Therapy Statement of Needs:  Patient is to use CBT, mindfulness and coping skills to help manage and/or decrease symptoms associated with their diagnosis. Symptoms:  Depressed/Irritable mood, worry, social withdrawal Problems Addressed:  Depressive thoughts, Sadness, Sleep issues, etc. Long Term Goals:  Pt to reduce overall level, frequency, and intensity of the feelings of depression as evidenced by decreased irritability, negative self talk, and helpless feelings from 6 to 7 days/week to 0 to 1 days/week, per client report, for at least 3 consecutive months.  Progress: 40% Short Term Goals:  Pt to verbally express understanding of the relationship between  feelings of depression and their impact on thinking patterns and behaviors.  Pt to verbalize an understanding of the role that distorted thinking plays in creating fears, excessive worry, and ruminations.  Progress: 40% Target Date:  11/17/2025 Frequency:  Bi-weekly Modality:  Cognitive Behavioral Therapy Interventions by Therapist:  Therapist will use CBT, Mindfulness exercises, Coping skills and Referrals, as needed by client. Client has verbally approved this treatment plan.  Francis KATHEE Macintosh, [facility] "

## 2024-11-30 LAB — GENECONNECT MOLECULAR SCREEN: Genetic Analysis Overall Interpretation: NEGATIVE

## 2024-12-05 ENCOUNTER — Ambulatory Visit (INDEPENDENT_AMBULATORY_CARE_PROVIDER_SITE_OTHER): Admitting: Family Medicine

## 2024-12-05 ENCOUNTER — Other Ambulatory Visit (HOSPITAL_COMMUNITY): Payer: Self-pay

## 2024-12-05 ENCOUNTER — Other Ambulatory Visit: Payer: Self-pay

## 2024-12-05 ENCOUNTER — Encounter (INDEPENDENT_AMBULATORY_CARE_PROVIDER_SITE_OTHER): Payer: Self-pay | Admitting: Family Medicine

## 2024-12-05 VITALS — BP 123/83 | HR 69 | Temp 97.7°F | Ht 69.0 in | Wt 291.0 lb

## 2024-12-05 DIAGNOSIS — E782 Mixed hyperlipidemia: Secondary | ICD-10-CM

## 2024-12-05 DIAGNOSIS — E119 Type 2 diabetes mellitus without complications: Secondary | ICD-10-CM | POA: Diagnosis not present

## 2024-12-05 DIAGNOSIS — Z7985 Long-term (current) use of injectable non-insulin antidiabetic drugs: Secondary | ICD-10-CM | POA: Diagnosis not present

## 2024-12-05 DIAGNOSIS — E559 Vitamin D deficiency, unspecified: Secondary | ICD-10-CM

## 2024-12-05 DIAGNOSIS — Z6841 Body Mass Index (BMI) 40.0 and over, adult: Secondary | ICD-10-CM | POA: Diagnosis not present

## 2024-12-05 MED ORDER — TIRZEPATIDE 10 MG/0.5ML ~~LOC~~ SOAJ
10.0000 mg | SUBCUTANEOUS | 0 refills | Status: AC
  Filled 2024-12-05: qty 2, 28d supply, fill #0

## 2024-12-05 MED ORDER — VITAMIN D (ERGOCALCIFEROL) 1.25 MG (50000 UNIT) PO CAPS
50000.0000 [IU] | ORAL_CAPSULE | ORAL | 0 refills | Status: AC
  Filled 2024-12-05: qty 4, 28d supply, fill #0

## 2024-12-05 MED ORDER — ROSUVASTATIN CALCIUM 5 MG PO TABS
5.0000 mg | ORAL_TABLET | Freq: Every day | ORAL | 0 refills | Status: AC
  Filled 2024-12-05: qty 30, 30d supply, fill #0

## 2024-12-05 MED ORDER — VITAMIN B-12 1000 MCG PO TABS
1000.0000 ug | ORAL_TABLET | Freq: Every day | ORAL | 0 refills | Status: AC
  Filled 2024-12-05: qty 90, 90d supply, fill #0

## 2024-12-05 NOTE — Progress Notes (Signed)
 "  Office: [phone]  /  Fax: [phone]  WEIGHT SUMMARY AND BIOMETRICS  Anthropometric Measurements Height: 5' 9 (1.753 m) Weight: 291 lb (132 kg) BMI (Calculated): 42.95 Weight at Last Visit: 293 lb Weight Lost Since Last Visit: 2 lb Weight Gained Since Last Visit: 0 Starting Weight: 308 lb Total Weight Loss (lbs): 17 lb (7.711 kg) Peak Weight: 308 lb   Body Composition  Body Fat %: 39 % Fat Mass (lbs): 113.8 lbs Muscle Mass (lbs): 169 lbs Total Body Water (lbs): 126.2 lbs Visceral Fat Rating : 26   Other Clinical Data Fasting: yes Labs: no Today's Visit #: 22 Starting Date: 04/10/19    Chief Complaint: OBESITY   Discussed the use of AI scribe software for clinical note transcription with the patient, who gave verbal consent to proceed.  History of Present Illness Kenneth Green is a 59 year old male with obesity who presents for obesity treatment and progress assessment.  He is adhering to the category three eating plan approximately 75% of the time and engages in physical activity by walking five days a week for 30 to 45 minutes. He has lost two pounds in the last month, which included the holiday period of Christmas and New Year's.  In addition to obesity, he is managing hyperlipidemia. He is actively working on reducing high cholesterol foods in his diet and is on Crestor  5 mg, for which he requests a refill.  He is being treated for B12 deficiency with a prescription of B12 1000 mcg daily and requests a refill.  He is being treated for vitamin D  deficiency with a prescription, and he requests a refill.  His type 2 diabetes is being managed with Mounjaro  10 mg weekly, alongside efforts in diet, exercise, and weight loss. He requests a refill for this medication.  Socially, he spent time with his family over the holidays, including his grandchildren, aged three and one. He has been journaling and tracking his progress, which he finds helps with  accountability. He has also set personal goals to avoid certain habits, including food, alcohol, casino, and tobacco, noting that food is the most challenging for him.      PHYSICAL EXAM:  Blood pressure 123/83, pulse 69, temperature 97.7 F (36.5 C), height 5' 9 (1.753 m), weight 291 lb (132 kg), SpO2 96%. Body mass index is 42.97 kg/m.  DIAGNOSTIC DATA REVIEWED BY MYSELF TODAY:  BMET    Component Value Date/Time   NA 141 10/08/2024 1424   K 4.3 10/08/2024 1424   CL 102 10/08/2024 1424   CO2 23 10/08/2024 1424   GLUCOSE 89 10/08/2024 1424   GLUCOSE 106 (H) 06/15/2016 1700   BUN 15 10/08/2024 1424   CREATININE 0.97 10/08/2024 1424   CALCIUM  10.0 10/08/2024 1424   GFRNONAA 74 05/11/2021 0000   GFRAA 91 01/15/2021 1322   Lab Results  Component Value Date   HGBA1C 5.6 10/08/2024   HGBA1C 5.9 (H) 04/10/2019   Lab Results  Component Value Date   INSULIN  28.2 (H) 10/08/2024   INSULIN  30.9 (H) 04/10/2019   Lab Results  Component Value Date   TSH 0.91 05/14/2024   CBC    Component Value Date/Time   WBC 6.3 10/08/2024 1424   WBC 7.7 06/15/2016 1700   RBC 4.85 10/08/2024 1424   RBC 4.92 05/14/2024 0000   HGB 13.8 10/08/2024 1424   HCT 42.3 10/08/2024 1424   PLT 241 10/08/2024 1424   MCV 87 10/08/2024 1424  MCH 28.5 10/08/2024 1424   MCH 29.0 06/15/2016 1700   MCHC 32.6 10/08/2024 1424   MCHC 34.6 06/15/2016 1700   RDW 13.1 10/08/2024 1424   Iron Studies No results found for: IRON, TIBC, FERRITIN, IRONPCTSAT Lipid Panel     Component Value Date/Time   CHOL 125 10/08/2024 1424   TRIG 81 10/08/2024 1424   HDL 48 10/08/2024 1424   LDLCALC 61 10/08/2024 1424   Hepatic Function Panel     Component Value Date/Time   PROT 6.9 10/08/2024 1424   ALBUMIN 4.5 10/08/2024 1424   AST 30 10/08/2024 1424   ALT 38 10/08/2024 1424   ALKPHOS 57 10/08/2024 1424   BILITOT 0.4 10/08/2024 1424      Component Value Date/Time   TSH 0.91 05/14/2024 0000   TSH  1.020 02/22/2023 1540   Nutritional Lab Results  Component Value Date   VD25OH 46.6 10/08/2024   VD25OH 37.8 04/30/2024   VD25OH 41.2 10/04/2023     Assessment and Plan Assessment & Plan Morbid obesity Following category three eating plan 75% of the time. Walking five days a week for 30-45 minutes. Lost two pounds in the last month. Challenges with binge eating and overeating. Journaling and meal prepping strategies discussed. - Continue category three eating plan - Continue walking five days a week for 30-45 minutes - Encouraged journaling and meal prepping strategies - Easy to prep recipes handout given  Mixed hyperlipidemia Working on decreasing high cholesterol foods in diet and weight loss to improve cholesterol levels. On Crestor  5 mg. - Continue Crestor  5 mg - Encouraged dietary modifications to decrease high cholesterol foods  Vitamin D  deficiency On prescription of semaglutide  50,000 international units weekly. - Continue semaglutide  50,000 international units weekly  Vitamin B12 deficiency On prescription of B12 1000 mcg daily. - Continue B12 1000 mcg daily  Type 2 diabetes mellitus On Mounjaro  10 mg weekly. Working on diet, exercise, and weight loss. - Continue Mounjaro  10 mg weekly - Encouraged diet, exercise, and weight loss      Patients who are on anti-obesity medications are counseled on the importance of maintaining healthy lifestyle habits, including balanced nutrition, regular physical activity, and behavioral modifications,  Medication is an adjunct to, not a replacement for, lifestyle changes and that the long-term success and weight maintenance depend on continued adherence to these strategies.   [name] was informed of the importance of frequent follow up visits to maximize his success with intensive lifestyle modifications for his obesity and obesity related health conditions as recommended by USPSTF and CMS guidelines  [name], MD   "

## 2024-12-16 ENCOUNTER — Other Ambulatory Visit: Payer: Self-pay | Admitting: Behavioral Health

## 2024-12-16 DIAGNOSIS — F3289 Other specified depressive episodes: Secondary | ICD-10-CM

## 2024-12-20 ENCOUNTER — Ambulatory Visit (INDEPENDENT_AMBULATORY_CARE_PROVIDER_SITE_OTHER): Admitting: Psychology

## 2024-12-20 DIAGNOSIS — F4323 Adjustment disorder with mixed anxiety and depressed mood: Secondary | ICD-10-CM | POA: Diagnosis not present

## 2024-12-20 NOTE — Progress Notes (Signed)
 "  [facility] Counselor/Therapist Progress Note  Patient ID: MOSS BERRY, MRN: [id number],    Date: 12/20/2024  Time Spent: 60 mins  start time: 1300   end time 1400  Treatment Type: Individual Therapy  Reported Symptoms: Pt presented via Caregility video for follow up session, stating his understanding of the limits of virtual visits and granting consent for the session.  Pt stated he was in his home and no one else was present.  I shared with pt that I am in my office and no one else is present.  Mental Status Exam: Appearance:  Casual     Behavior: Appropriate  Motor: Normal  Speech/Language:  Clear and Coherent  Affect: Appropriate  Mood: normal  Thought process: normal  Thought content:   WNL  Sensory/Perceptual disturbances:   WNL  Orientation: oriented to person, place, and time/date  Attention: Good  Concentration: Good  Memory: WNL  Fund of knowledge:  Good  Insight:   Good  Judgment:  Good  Impulse Control: Good   Risk Assessment: Danger to Self:  No Self-injurious Behavior: No Danger to Others: No Duty to Warn:no Physical Aggression / Violence:No  Access to Firearms a concern: No  Gang Involvement:No   Notified of retirement  Next session: list of things he is good at; talk to the journalist, newspaper at church, not beating himself up.  Subjective: Pt shares that I am doing OK; I made it just fine through the winter weather just fine this past weekend and I did not go to work Monday night because of the weather.  Pt shares that his daughter will be moving in with him by the end of Feb; it will create financial benefit for both of them.  They are being intentional about the process of going through his things and her things and getting rid of stuff as well.  Pt shares again that he does not feel like he is good at doing things that are good for him; I often beat myself up for doing this bad thing or that bad thing.  Pt shares that I lost my  accountability partners when [place] and my mom died.  Talked with pt about ways to establish new accountability partners.  Pt goes to [place] and [place] by himself.  He shares that he does not ever lose control of himself with gambling or drinking.  Pt has put effort into setting limits for himself and never goes too far.  Pt shares that he did talk with the orchestra leader at church; he is going to start back with them soon and he likes that decision for himself.  Encouraged pt to continue with his self care activities and we will meet in 3 wks for a follow up session.  Interventions: Cognitive Behavioral Therapy  Diagnosis:Adjustment disorder with mixed anxiety and depressed mood  Plan: Treatment Plan Strengths/Abilities:  Intelligent, Intuitive, Willing to participate in therapy Treatment Preferences:  Outpatient Individual Therapy Statement of Needs:  Patient is to use CBT, mindfulness and coping skills to help manage and/or decrease symptoms associated with their diagnosis. Symptoms:  Depressed/Irritable mood, worry, social withdrawal Problems Addressed:  Depressive thoughts, Sadness, Sleep issues, etc. Long Term Goals:  Pt to reduce overall level, frequency, and intensity of the feelings of depression as evidenced by decreased irritability, negative self talk, and helpless feelings from 6 to 7 days/week to 0 to 1 days/week, per client report, for at least 3 consecutive months.  Progress: 40% Short Term Goals:  Pt to verbally express understanding of the relationship between feelings of depression and their impact on thinking patterns and behaviors.  Pt to verbalize an understanding of the role that distorted thinking plays in creating fears, excessive worry, and ruminations.  Progress: 40% Target Date:  11/17/2025 Frequency:  Bi-weekly Modality:  Cognitive Behavioral Therapy Interventions by Therapist:  Therapist will use CBT, Mindfulness exercises, Coping skills and Referrals, as needed  by client. Client has verbally approved this treatment plan.  Francis KATHEE Macintosh, [facility] "

## 2025-01-08 ENCOUNTER — Ambulatory Visit (INDEPENDENT_AMBULATORY_CARE_PROVIDER_SITE_OTHER): Admitting: Family Medicine

## 2025-01-10 ENCOUNTER — Ambulatory Visit: Admitting: Psychology

## 2025-02-05 ENCOUNTER — Ambulatory Visit (INDEPENDENT_AMBULATORY_CARE_PROVIDER_SITE_OTHER): Admitting: Family Medicine

## 2025-05-01 ENCOUNTER — Ambulatory Visit: Admitting: Behavioral Health
